# Patient Record
Sex: Male | Born: 1945 | Race: Black or African American | Hispanic: No | Smoking: Former smoker
Health system: Southern US, Community
[De-identification: ages and names within clinical notes are randomized; demographics above are authoritative.]

## PROBLEM LIST (undated history)

## (undated) DIAGNOSIS — I509 Heart failure, unspecified: Secondary | ICD-10-CM

## (undated) DIAGNOSIS — I219 Acute myocardial infarction, unspecified: Secondary | ICD-10-CM

## (undated) DIAGNOSIS — M1990 Unspecified osteoarthritis, unspecified site: Secondary | ICD-10-CM

## (undated) DIAGNOSIS — C801 Malignant (primary) neoplasm, unspecified: Secondary | ICD-10-CM

## (undated) DIAGNOSIS — C61 Malignant neoplasm of prostate: Secondary | ICD-10-CM

## (undated) DIAGNOSIS — I10 Essential (primary) hypertension: Secondary | ICD-10-CM

## (undated) HISTORY — DX: Acute myocardial infarction, unspecified: I21.9

## (undated) HISTORY — DX: Malignant neoplasm of prostate: C61

## (undated) HISTORY — PX: JOINT REPLACEMENT: SHX530

## (undated) HISTORY — DX: Malignant (primary) neoplasm, unspecified: C80.1

## (undated) HISTORY — PX: BACK SURGERY: SHX140

---

## 2002-09-21 ENCOUNTER — Inpatient Hospital Stay (HOSPITAL_COMMUNITY): Admission: RE | Admit: 2002-09-21 | Discharge: 2002-09-22 | Payer: Self-pay | Admitting: Neurosurgery

## 2002-09-21 ENCOUNTER — Encounter: Payer: Self-pay | Admitting: Neurosurgery

## 2002-11-07 ENCOUNTER — Encounter: Admission: RE | Admit: 2002-11-07 | Discharge: 2002-11-07 | Payer: Self-pay | Admitting: Neurosurgery

## 2002-11-07 ENCOUNTER — Encounter: Payer: Self-pay | Admitting: Neurosurgery

## 2006-07-07 ENCOUNTER — Ambulatory Visit: Payer: Self-pay | Admitting: Family Medicine

## 2008-06-08 ENCOUNTER — Ambulatory Visit: Payer: Self-pay | Admitting: Family Medicine

## 2010-02-24 ENCOUNTER — Ambulatory Visit: Payer: Self-pay | Admitting: Family Medicine

## 2010-12-17 ENCOUNTER — Ambulatory Visit: Payer: Self-pay | Admitting: Family Medicine

## 2012-01-25 ENCOUNTER — Ambulatory Visit: Payer: Self-pay | Admitting: Gastroenterology

## 2012-02-10 ENCOUNTER — Ambulatory Visit: Payer: Self-pay | Admitting: Gastroenterology

## 2013-09-14 ENCOUNTER — Ambulatory Visit: Payer: Self-pay | Admitting: Gastroenterology

## 2015-04-10 ENCOUNTER — Encounter
Admission: RE | Admit: 2015-04-10 | Discharge: 2015-04-10 | Disposition: A | Discharge status: Home / Self Care | Payer: 59 | Source: Ambulatory Visit | Attending: Specialist | Admitting: Specialist

## 2015-04-10 DIAGNOSIS — M25562 Pain in left knee: Secondary | ICD-10-CM | POA: Insufficient documentation

## 2015-04-10 DIAGNOSIS — Z01810 Encounter for preprocedural cardiovascular examination: Secondary | ICD-10-CM | POA: Insufficient documentation

## 2015-04-10 DIAGNOSIS — M1712 Unilateral primary osteoarthritis, left knee: Secondary | ICD-10-CM | POA: Insufficient documentation

## 2015-04-10 DIAGNOSIS — Z79899 Other long term (current) drug therapy: Secondary | ICD-10-CM | POA: Diagnosis not present

## 2015-04-10 DIAGNOSIS — Z01812 Encounter for preprocedural laboratory examination: Secondary | ICD-10-CM | POA: Diagnosis present

## 2015-04-10 DIAGNOSIS — I10 Essential (primary) hypertension: Secondary | ICD-10-CM | POA: Diagnosis not present

## 2015-04-10 HISTORY — DX: Unspecified osteoarthritis, unspecified site: M19.90

## 2015-04-10 HISTORY — DX: Essential (primary) hypertension: I10

## 2015-04-10 LAB — URINALYSIS COMPLETE WITH MICROSCOPIC (ARMC ONLY)
Bacteria, UA: NONE SEEN
Bilirubin Urine: NEGATIVE
Glucose, UA: NEGATIVE mg/dL
HGB URINE DIPSTICK: NEGATIVE
Ketones, ur: NEGATIVE mg/dL
Leukocytes, UA: NEGATIVE
Nitrite: NEGATIVE
PH: 6 (ref 5.0–8.0)
Protein, ur: NEGATIVE mg/dL
RBC / HPF: NONE SEEN RBC/hpf (ref 0–5)
SQUAMOUS EPITHELIAL / LPF: NONE SEEN
Specific Gravity, Urine: 1.016 (ref 1.005–1.030)

## 2015-04-10 LAB — SURGICAL PCR SCREEN
MRSA, PCR: NEGATIVE
Staphylococcus aureus: NEGATIVE

## 2015-04-10 LAB — TYPE AND SCREEN
ABO/RH(D): B NEG
Antibody Screen: NEGATIVE

## 2015-04-10 LAB — BASIC METABOLIC PANEL
Anion gap: 9 (ref 5–15)
BUN: 16 mg/dL (ref 6–20)
CALCIUM: 9.8 mg/dL (ref 8.9–10.3)
CO2: 26 mmol/L (ref 22–32)
Chloride: 105 mmol/L (ref 101–111)
Creatinine, Ser: 1.03 mg/dL (ref 0.61–1.24)
GFR calc Af Amer: >60 mL/min (ref >60)
GFR calc non Af Amer: >60 mL/min (ref >60)
Glucose, Bld: 90 mg/dL (ref 65–99)
Potassium: 4 mmol/L (ref 3.5–5.1)
SODIUM: 140 mmol/L (ref 135–145)

## 2015-04-10 LAB — CBC
HEMATOCRIT: 42.2 % (ref 40.0–52.0)
Hemoglobin: 14.3 g/dL (ref 13.0–18.0)
MCH: 32.5 pg (ref 26.0–34.0)
MCHC: 33.8 g/dL (ref 32.0–36.0)
MCV: 95.9 fL (ref 80.0–100.0)
Platelets: 134 10*3/uL — ABNORMAL LOW (ref 150–440)
RBC: 4.4 MIL/uL (ref 4.40–5.90)
RDW: 12.3 % (ref 11.5–14.5)
WBC: 4.4 10*3/uL (ref 3.8–10.6)

## 2015-04-10 LAB — PROTIME-INR
INR: 0.96
Prothrombin Time: 13 seconds (ref 11.4–15.0)

## 2015-04-10 LAB — APTT: aPTT: 31 seconds (ref 24–36)

## 2015-04-10 NOTE — OR Nursing (Signed)
EKG reviewed by Dr. Myra Gianotti and Laurie Panda for surgery

## 2015-04-10 NOTE — Patient Instructions (Signed)
  Your procedure is scheduled on: April 23, 2015 Report to Same  Day Surgery. To find out your arrival time please call [phone] between 1PM - 3PM on April 22, 2015.  Remember: Instructions that are not followed completely may result in serious medical risk, up to and including death, or upon the discretion of your surgeon and anesthesiologist your surgery may need to be rescheduled.    __x__ 1. Do not eat food or drink liquids after midnight. No gum chewing or hard candies.     ____ 2. No Alcohol for 24 hours before or after surgery.   ____ 3. Bring all medications with you on the day of surgery if instructed.    __x__ 4. Notify your doctor if there is any change in your medical condition     (cold, fever, infections).     Do not wear jewelry, make-up, hairpins, clips or nail polish.  Do not wear lotions, powders, or perfumes. You may wear deodorant.  Do not shave 48 hours prior to surgery. Men may shave face and neck.  Do not bring valuables to the hospital.    [facility] is not responsible for any belongings or valuables.               Contacts, dentures or bridgework may not be worn into surgery.  Leave your suitcase in the car. After surgery it may be brought to your room.  For patients admitted to the hospital, discharge time is determined by your                treatment team.   Patients discharged the day of surgery will not be allowed to drive home.   Please read over the following fact sheets that you were given:   Surgical Site Infection Prevention   ____ Take these medicines the morning of surgery with A SIP OF WATER:    1.   2.   3.   4.  5.  6.  ____ Fleet Enema (as directed)   __x__ Use CHG Soap as directed  ____ Use inhalers on the day of surgery  ____ Stop metformin 2 days prior to surgery    ____ Take 1/2 of usual insulin dose the night before surgery and none on the morning of surgery.   __x__ Stop Coumadin/Plavix/aspirin on one week before  surgery __x__ Stop Anti-inflammatories on one week before surgery (Meloxicam)   __x__ Stop supplements until after surgery.  now  ____ Bring C-Pap to the hospital.

## 2015-04-10 NOTE — OR Nursing (Signed)
Chart to anesthesia to review for abnormal EKG

## 2015-04-11 LAB — ABO/RH: ABO/RH(D): B NEG

## 2015-04-23 ENCOUNTER — Inpatient Hospital Stay: Payer: 59 | Admitting: Anesthesiology

## 2015-04-23 ENCOUNTER — Encounter: Payer: Self-pay | Admitting: *Deleted

## 2015-04-23 ENCOUNTER — Inpatient Hospital Stay
Admission: RE | Admit: 2015-04-23 | Discharge: 2015-04-26 | DRG: 470 | Disposition: A | Discharge status: Home / Self Care | Payer: 59 | Source: Ambulatory Visit | Attending: Specialist | Admitting: Specialist

## 2015-04-23 ENCOUNTER — Encounter
Admission: RE | Disposition: A | Discharge status: Home / Self Care | Payer: Medicare Other | Source: Ambulatory Visit | Attending: Specialist

## 2015-04-23 ENCOUNTER — Inpatient Hospital Stay: Payer: 59

## 2015-04-23 DIAGNOSIS — M1712 Unilateral primary osteoarthritis, left knee: Principal | ICD-10-CM | POA: Diagnosis present

## 2015-04-23 DIAGNOSIS — Z87891 Personal history of nicotine dependence: Secondary | ICD-10-CM | POA: Diagnosis not present

## 2015-04-23 DIAGNOSIS — I10 Essential (primary) hypertension: Secondary | ICD-10-CM | POA: Diagnosis present

## 2015-04-23 DIAGNOSIS — M1710 Unilateral primary osteoarthritis, unspecified knee: Secondary | ICD-10-CM | POA: Diagnosis present

## 2015-04-23 DIAGNOSIS — Z96652 Presence of left artificial knee joint: Secondary | ICD-10-CM

## 2015-04-23 DIAGNOSIS — Z96659 Presence of unspecified artificial knee joint: Secondary | ICD-10-CM

## 2015-04-23 HISTORY — PX: TOTAL KNEE ARTHROPLASTY: SHX125

## 2015-04-23 SURGERY — ARTHROPLASTY, KNEE, TOTAL
Anesthesia: Spinal | Laterality: Left | Wound class: Clean

## 2015-04-23 MED ORDER — CEFAZOLIN SODIUM-DEXTROSE 2-3 GM-% IV SOLR
INTRAVENOUS | Status: AC
  Filled 2015-04-23: qty 50

## 2015-04-23 MED ORDER — ONDANSETRON HCL 4 MG PO TABS: 4.0000 mg | ORAL_TABLET | Freq: Four times a day (QID) | ORAL | Status: DC | PRN

## 2015-04-23 MED ORDER — ADULT MULTIVITAMIN W/MINERALS CH
1.0000 | ORAL_TABLET | Freq: Every day | ORAL | Status: DC
  Administered 2015-04-23 – 2015-04-26 (×4): 1 via ORAL
  Filled 2015-04-23 (×4): qty 1

## 2015-04-23 MED ORDER — PROPOFOL INFUSION 10 MG/ML OPTIME
INTRAVENOUS | Status: DC | PRN
  Administered 2015-04-23: 50 ug/kg/min via INTRAVENOUS

## 2015-04-23 MED ORDER — BUPIVACAINE HCL (PF) 0.5 % IJ SOLN
INTRAMUSCULAR | Status: AC
  Filled 2015-04-23: qty 30

## 2015-04-23 MED ORDER — SODIUM CHLORIDE BACTERIOSTATIC 0.9 % IJ SOLN
INTRAMUSCULAR | Status: DC | PRN
  Administered 2015-04-23: 40 mL

## 2015-04-23 MED ORDER — FENTANYL CITRATE (PF) 100 MCG/2ML IJ SOLN: 25.0000 ug | INTRAMUSCULAR | Status: DC | PRN

## 2015-04-23 MED ORDER — SODIUM CHLORIDE 0.9 % IV SOLN
INTRAVENOUS | Status: DC
  Administered 2015-04-23 – 2015-04-24 (×2): via INTRAVENOUS

## 2015-04-23 MED ORDER — KETAMINE HCL 50 MG/ML IJ SOLN
INTRAMUSCULAR | Status: DC | PRN
  Administered 2015-04-23: 50 mg via INTRAMUSCULAR
  Administered 2015-04-23: 25 mg via INTRAMUSCULAR

## 2015-04-23 MED ORDER — SODIUM CHLORIDE 0.9 % IV SOLN
Freq: Once | INTRAVENOUS | Status: AC
  Administered 2015-04-23: 13:00:00 via INTRAVENOUS

## 2015-04-23 MED ORDER — FAMOTIDINE 20 MG PO TABS
ORAL_TABLET | ORAL | Status: AC
  Administered 2015-04-23: 20 mg via ORAL
  Filled 2015-04-23: qty 1

## 2015-04-23 MED ORDER — BUPIVACAINE LIPOSOME 1.3 % IJ SUSP
INTRAMUSCULAR | Status: DC | PRN
  Administered 2015-04-23: 20 mL

## 2015-04-23 MED ORDER — MENTHOL 3 MG MT LOZG
1.0000 | LOZENGE | OROMUCOSAL | Status: DC | PRN
  Filled 2015-04-23: qty 9

## 2015-04-23 MED ORDER — PHENOL 1.4 % MT LIQD
1.0000 | OROMUCOSAL | Status: DC | PRN
  Filled 2015-04-23: qty 177

## 2015-04-23 MED ORDER — CEFAZOLIN SODIUM-DEXTROSE 2-3 GM-% IV SOLR
2.0000 g | Freq: Once | INTRAVENOUS | Status: AC
  Administered 2015-04-23: 2 g via INTRAVENOUS

## 2015-04-23 MED ORDER — NEOMYCIN-POLYMYXIN B GU 40-200000 IR SOLN
Status: DC | PRN
  Administered 2015-04-23: 3000 mL

## 2015-04-23 MED ORDER — FAMOTIDINE 20 MG PO TABS
20.0000 mg | ORAL_TABLET | Freq: Once | ORAL | Status: AC
  Administered 2015-04-23: 20 mg via ORAL

## 2015-04-23 MED ORDER — BUPIVACAINE IN DEXTROSE 0.75-8.25 % IT SOLN
INTRATHECAL | Status: DC | PRN
  Administered 2015-04-23: 1.6 mL via INTRATHECAL

## 2015-04-23 MED ORDER — ONDANSETRON HCL 4 MG/2ML IJ SOLN: 4.0000 mg | Freq: Four times a day (QID) | INTRAMUSCULAR | Status: DC | PRN

## 2015-04-23 MED ORDER — ONDANSETRON HCL 4 MG/2ML IJ SOLN: 4.0000 mg | Freq: Once | INTRAMUSCULAR | Status: DC | PRN

## 2015-04-23 MED ORDER — PRAVASTATIN SODIUM 20 MG PO TABS
20.0000 mg | ORAL_TABLET | Freq: Every day | ORAL | Status: DC
  Administered 2015-04-23 – 2015-04-25 (×3): 20 mg via ORAL
  Filled 2015-04-23 (×3): qty 1

## 2015-04-23 MED ORDER — ACETAMINOPHEN 325 MG PO TABS: 650.0000 mg | ORAL_TABLET | Freq: Four times a day (QID) | ORAL | Status: DC | PRN

## 2015-04-23 MED ORDER — CELECOXIB 200 MG PO CAPS
200.0000 mg | ORAL_CAPSULE | Freq: Two times a day (BID) | ORAL | Status: DC
  Administered 2015-04-23 – 2015-04-26 (×6): 200 mg via ORAL
  Filled 2015-04-23 (×6): qty 1

## 2015-04-23 MED ORDER — MIDAZOLAM HCL 5 MG/5ML IJ SOLN
INTRAMUSCULAR | Status: DC | PRN
  Administered 2015-04-23: 2 mg via INTRAVENOUS

## 2015-04-23 MED ORDER — ENOXAPARIN SODIUM 30 MG/0.3ML ~~LOC~~ SOLN
30.0000 mg | Freq: Two times a day (BID) | SUBCUTANEOUS | Status: DC
  Administered 2015-04-24 – 2015-04-26 (×5): 30 mg via SUBCUTANEOUS
  Filled 2015-04-23 (×5): qty 0.3

## 2015-04-23 MED ORDER — GLYCOPYRROLATE 0.2 MG/ML IJ SOLN
INTRAMUSCULAR | Status: DC | PRN
  Administered 2015-04-23: 0.2 mg via INTRAVENOUS

## 2015-04-23 MED ORDER — VITAMIN B-12 1000 MCG PO TABS
1000.0000 ug | ORAL_TABLET | Freq: Every day | ORAL | Status: DC
  Administered 2015-04-23 – 2015-04-26 (×4): 1000 ug via ORAL
  Filled 2015-04-23 (×4): qty 1

## 2015-04-23 MED ORDER — BUPIVACAINE HCL 0.5 % IJ SOLN
INTRAMUSCULAR | Status: DC | PRN
  Administered 2015-04-23: 22 mL

## 2015-04-23 MED ORDER — OXYCODONE HCL 5 MG PO TABS
5.0000 mg | ORAL_TABLET | ORAL | Status: DC | PRN
  Administered 2015-04-23: 5 mg via ORAL
  Administered 2015-04-23 – 2015-04-25 (×8): 10 mg via ORAL
  Administered 2015-04-25 – 2015-04-26 (×4): 5 mg via ORAL
  Filled 2015-04-23 (×2): qty 1
  Filled 2015-04-23 (×4): qty 2
  Filled 2015-04-23 (×3): qty 1
  Filled 2015-04-23 (×4): qty 2

## 2015-04-23 MED ORDER — SODIUM CHLORIDE 0.9 % IV SOLN
Freq: Once | INTRAVENOUS | Status: AC
  Administered 2015-04-23: 18:00:00 via INTRAVENOUS

## 2015-04-23 MED ORDER — CEFAZOLIN SODIUM-DEXTROSE 2-3 GM-% IV SOLR
2.0000 g | Freq: Four times a day (QID) | INTRAVENOUS | Status: AC
  Administered 2015-04-23 (×2): 2 g via INTRAVENOUS
  Filled 2015-04-23 (×2): qty 50

## 2015-04-23 MED ORDER — ACETAMINOPHEN 650 MG RE SUPP: 650.0000 mg | Freq: Four times a day (QID) | RECTAL | Status: DC | PRN

## 2015-04-23 MED ORDER — PROPOFOL 10 MG/ML IV BOLUS
INTRAVENOUS | Status: DC | PRN
  Administered 2015-04-23: 10 mg via INTRAVENOUS
  Administered 2015-04-23: 20 mg via INTRAVENOUS

## 2015-04-23 MED ORDER — HYDROMORPHONE HCL 1 MG/ML IJ SOLN
1.0000 mg | INTRAMUSCULAR | Status: DC | PRN
  Administered 2015-04-23 (×2): 1 mg via INTRAVENOUS
  Filled 2015-04-23 (×2): qty 1

## 2015-04-23 MED ORDER — LACTATED RINGERS IV SOLN
INTRAVENOUS | Status: DC
  Administered 2015-04-23 (×2): via INTRAVENOUS

## 2015-04-23 MED ORDER — TRIAMTERENE-HCTZ 37.5-25 MG PO TABS
1.0000 | ORAL_TABLET | Freq: Every day | ORAL | Status: DC
  Administered 2015-04-23 – 2015-04-25 (×3): 1 via ORAL
  Filled 2015-04-23 (×3): qty 1

## 2015-04-23 MED ORDER — NEOMYCIN-POLYMYXIN B GU 40-200000 IR SOLN
Status: AC
  Filled 2015-04-23: qty 20

## 2015-04-23 MED ORDER — BUPIVACAINE LIPOSOME 1.3 % IJ SUSP
INTRAMUSCULAR | Status: AC
  Filled 2015-04-23: qty 20

## 2015-04-23 MED ORDER — SODIUM CHLORIDE 0.9 % IJ SOLN
INTRAMUSCULAR | Status: AC
  Filled 2015-04-23: qty 50

## 2015-04-23 MED FILL — Sodium Chloride Irrigation Soln 0.9%: Qty: 3000 | Status: AC

## 2015-04-23 MED FILL — Neomycin-Polymyxin B GU Irrigation Soln: Qty: 12 | Status: AC

## 2015-04-23 SURGICAL SUPPLY — 53 items
AUTOTRANSFUS HAS 1/8 (MISCELLANEOUS) ×3
BANDAGE ELASTIC 4 CLIP ST LF (GAUZE/BANDAGES/DRESSINGS) ×3 IMPLANT
BLADE DEBAKEY 8.0 (BLADE) ×2 IMPLANT
BLADE DEBAKEY 8.0MM (BLADE) ×1
BLADE SAGITTAL WIDE XTHICK NO (BLADE) ×3 IMPLANT
BNDG COHESIVE 6X5 TAN STRL LF (GAUZE/BANDAGES/DRESSINGS) ×3 IMPLANT
CANISTER SUCT 1200ML W/VALVE (MISCELLANEOUS) ×3 IMPLANT
CANISTER SUCT 3000ML (MISCELLANEOUS) ×3 IMPLANT
CAP KNEE TOTAL 3 SIGMA ×2 IMPLANT
CATH TRAY 16F METER LATEX (MISCELLANEOUS) ×3 IMPLANT
CEMENT HV SMART SET (Cement) ×4 IMPLANT
CHLORAPREP W/TINT 26ML (MISCELLANEOUS) ×3 IMPLANT
COOLER POLAR GLACIER W/PUMP (MISCELLANEOUS) ×3 IMPLANT
DRAPE INCISE IOBAN 66X45 STRL (DRAPES) ×3 IMPLANT
DRAPE SHEET LG 3/4 BI-LAMINATE (DRAPES) ×6 IMPLANT
DURAPREP 26ML APPLICATOR (WOUND CARE) ×3 IMPLANT
GAUZE PETRO XEROFOAM 1X8 (MISCELLANEOUS) ×6 IMPLANT
GAUZE SPONGE 4X4 12PLY STRL (GAUZE/BANDAGES/DRESSINGS) ×3 IMPLANT
GAUZE XEROFORM 4X4 STRL (GAUZE/BANDAGES/DRESSINGS) ×2 IMPLANT
GLOVE INDICATOR 8.0 STRL GRN (GLOVE) ×7 IMPLANT
GLOVE SURG ORTHO 8.0 STRL STRW (GLOVE) ×8 IMPLANT
GOWN STRL REUS W/ TWL LRG LVL3 (GOWN DISPOSABLE) ×3 IMPLANT
GOWN STRL REUS W/TWL LRG LVL3 (GOWN DISPOSABLE) ×9
HANDPIECE SUCTION TUBG SURGILV (MISCELLANEOUS) ×3 IMPLANT
HOLDER FOLEY CATH W/STRAP (MISCELLANEOUS) ×3 IMPLANT
IMMBOLIZER KNEE 19 BLUE UNIV (SOFTGOODS) ×3 IMPLANT
NDL SAFETY 18GX1.5 (NEEDLE) ×3 IMPLANT
NDL SPNL 18GX3.5 QUINCKE PK (NEEDLE) IMPLANT
NEEDLE SPNL 18GX3.5 QUINCKE PK (NEEDLE) ×3 IMPLANT
NS IRRIG 1000ML POUR BTL (IV SOLUTION) ×3 IMPLANT
PACK TOTAL KNEE (MISCELLANEOUS) ×3 IMPLANT
PAD GROUND ADULT SPLIT (MISCELLANEOUS) ×3 IMPLANT
PAD PREP 24X41 OB/GYN DISP (PERSONAL CARE ITEMS) ×1 IMPLANT
PAD WRAPON POLAR KNEE (MISCELLANEOUS) ×1 IMPLANT
PIN DRILL QUICK PACK ×3 IMPLANT
PIN FIXATION 1/8DIA X 3INL (PIN) ×3 IMPLANT
SOL .9 NS 3000ML IRR  AL (IV SOLUTION) ×2
SOL .9 NS 3000ML IRR AL (IV SOLUTION) ×1
SOL .9 NS 3000ML IRR UROMATIC (IV SOLUTION) ×1 IMPLANT
STAPLER SKIN PROX 35W (STAPLE) ×3 IMPLANT
STRAP SAFETY BODY (MISCELLANEOUS) ×1 IMPLANT
SUCTION FRAZIER TIP 10 FR DISP (SUCTIONS) ×3 IMPLANT
SUT ETHIBOND CT1 BRD #0 30IN (SUTURE) ×5 IMPLANT
SUT ETHIBOND NAB CT1 #1 30IN (SUTURE) ×4 IMPLANT
SUT VIC AB 2-0 CT1 27 (SUTURE) ×6
SUT VIC AB 2-0 CT1 TAPERPNT 27 (SUTURE) ×2 IMPLANT
SYR 20CC LL (SYRINGE) ×3 IMPLANT
SYR TOOMEY 50ML (SYRINGE) ×2 IMPLANT
SYSTEM AUTOTRANSFUS DUAL TROCR (MISCELLANEOUS) ×1 IMPLANT
TOWER CARTRIDGE SMART MIX (DISPOSABLE) ×3 IMPLANT
TUBE SUCT KAM VAC (TUBING) ×3 IMPLANT
WATER STERILE IRR 1000ML POUR (IV SOLUTION) ×3 IMPLANT
WRAPON POLAR PAD KNEE (MISCELLANEOUS) ×3

## 2015-04-23 NOTE — Progress Notes (Signed)
Applied avi boots and polar care

## 2015-04-23 NOTE — Brief Op Note (Signed)
04/23/2015  10:11 AM  PATIENT:  Rodell Perna  69 y.o. male none PRE-OPERATIVE DIAGNOSIS:  Severe left knee pain secondary to osteoarthritis   POST-OPERATIVE DIAGNOSIS:  Osteoarthritis   PROCEDURE:  Procedure(s): TOTAL KNEE ARTHROPLASTY (Left)  SURGEON:  Surgeon(s) and Role:    * Christophe Louis, MD - Primary    * Earnestine Leys, MD - Assisting  PHYSICIAN ASSISTANT:   ASSISTANTS:    ANESTHESIA:   spinal  EBL:  Total I/O In: 1500 [I.V.:1500] Out: 125 [Urine:100; Blood:25]  BLOOD ADMINISTERED:none  DRAINS: (Autovac drain, usual autovac rules) Hemovact drain(s) in the knee with  Suction Open   LOCAL MEDICATIONS USED:  MARCAINE     SPECIMEN:  No Specimen  DISPOSITION OF SPECIMEN:  N/A  COUNTS:  YES  TOURNIQUET:   Total Tourniquet Time Documented: Thigh (Left) - 97 minutes Total: Thigh (Left) - 97 minutes   DICTATION: .Other Dictation: Dictation Number [id number]  PLAN OF CARE: Admit to inpatient   PATIENT DISPOSITION:  PACU - hemodynamically stable.   Delay start of Pharmacological VTE agent (>24hrs) due to surgical blood loss or risk of bleeding: no

## 2015-04-23 NOTE — Plan of Care (Signed)
Problem: Consults Goal: Diagnosis- Total Joint Replacement Primary Total Knee     

## 2015-04-23 NOTE — H&P (Signed)
  69 year old male with severe osteoarthritis left knee. Complete H and P has been added to the chart today as a paper document.  Heart and lungs clear. ENT normal. Plan: left LCS total knee replacement.

## 2015-04-23 NOTE — Transfer of Care (Signed)
Immediate Anesthesia Transfer of Care Note  Patient: Seth Byrd  Procedure(s) Performed: Procedure(s): TOTAL KNEE ARTHROPLASTY (Left)  Patient Location: PACU  Anesthesia Type:Spinal  Level of Consciousness: sedated  Airway & Oxygen Therapy: Patient connected to face mask oxygen  Post-op Assessment: Report given to RN and Post -op Vital signs reviewed and stable  Post vital signs: Reviewed and stable  Last Vitals:  Filed Vitals:   04/23/15 0620  BP: 140/85  Pulse: 72  Temp: 36.6 C  Resp: 16    Complications: No apparent anesthesia complications

## 2015-04-23 NOTE — Anesthesia Postprocedure Evaluation (Signed)
  Anesthesia Post-op Note  Patient: Seth Byrd  Procedure(s) Performed: Procedure(s): TOTAL KNEE ARTHROPLASTY (Left)  Anesthesia type:Spinal  Patient location: PACU  Post pain: Pain level controlled  Post assessment: Post-op Vital signs reviewed, Patient's Cardiovascular Status Stable, Respiratory Function Stable, Patent Airway and No signs of Nausea or vomiting  Post vital signs: Reviewed and stable  Last Vitals:  Filed Vitals:   04/23/15 1204  BP: 129/61  Pulse: 56  Temp: 36.4 C  Resp: 18    Level of consciousness: awake, alert  and patient cooperative  Complications: No apparent anesthesia complications

## 2015-04-23 NOTE — Progress Notes (Signed)
Dr Tamala Julian notified of no home meds  Resumed. md reports re-order mevacor 20mg  po qd,maxzide 37.5/25 po qhs,vitamin b12 1000mg  po qd  And multivitamin po qd. Pt with full sensation ble. Pain relief with present regime. Tolerated 1 autovac without incident. Good appetite

## 2015-04-23 NOTE — Anesthesia Preprocedure Evaluation (Addendum)
Anesthesia Evaluation  Patient identified by MRN, date of birth, ID band Patient awake    Reviewed: Allergy & Precautions, NPO status , Patient's Chart, lab work & pertinent test results, reviewed documented beta blocker date and time   Airway Mallampati: II  TM Distance: >3 FB     Dental  (+) Upper Dentures, Lower Dentures   Pulmonary former smoker,          Cardiovascular hypertension,     Neuro/Psych    GI/Hepatic   Endo/Other    Renal/GU      Musculoskeletal  (+) Arthritis -,   Abdominal   Peds  Hematology   Anesthesia Other Findings Pt has had lumbar lam with fusion. He understands that GOT is a possibility.  Reproductive/Obstetrics                           Anesthesia Physical Anesthesia Plan  ASA: III  Anesthesia Plan: Spinal   Post-op Pain Management:    Induction:   Airway Management Planned: Nasal Cannula  Additional Equipment:   Intra-op Plan:   Post-operative Plan:   Informed Consent: I have reviewed the patients History and Physical, chart, labs and discussed the procedure including the risks, benefits and alternatives for the proposed anesthesia with the patient or authorized representative who has indicated his/her understanding and acceptance.     Plan Discussed with: CRNA  Anesthesia Plan Comments:         Anesthesia Quick Evaluation

## 2015-04-23 NOTE — Care Management Note (Addendum)
Case Management Note  Patient Details  Name: Seth Byrd MRN: [id number] Date of Birth: 05-04-1946  Subjective/Objective:                  Met with patient and one of his daughters (both attended joint class) to discuss discharge planning. He states [employer] is his primary payer since he still works; Medicare is secondary. He IS NOT A MEDICARE BUNDLE patient according to this information. Met with daughter Kenney Houseman that patient would be going home with and her address is: [address] [place]. He has never used home health and has never been to SNF. He has his personal rolling walker present in this room for PT to evaluation. He uses Paediatric nurse on [address] for Rx.  Action/Plan: List of home health agencies left for this patient. RNCM will continue to follow.   Expected Discharge Date:                  Expected Discharge Plan:     In-House Referral:     Discharge planning Services  CM Consult  Post Acute Care Choice:    Choice offered to:  Patient, Adult Children  DME Arranged:    DME Agency:     HH Arranged:    [place] Agency:     Status of Service:     Medicare Important Message Given:    Date Medicare IM Given:    Medicare IM give by:    Date Additional Medicare IM Given:    Additional Medicare Important Message give by:     If discussed at [place] of Stay Meetings, dates discussed:    Additional Comments:  Daughters still deciding on home health agency.. To call RNCM at [phone] with name of agency.   Marshell Garfinkel, RN 04/23/2015, 2:31 PM

## 2015-04-23 NOTE — Anesthesia Procedure Notes (Addendum)
Spinal Patient location during procedure: OR Start time: 04/23/2015 7:30 AM End time: 04/23/2015 7:35 AM Staffing Anesthesiologist: Gunnar Bulla Performed by: anesthesiologist  Preanesthetic Checklist Completed: patient identified, site marked, surgical consent, pre-op evaluation, timeout performed, IV checked and risks and benefits discussed Spinal Block Patient position: sitting Prep: Betadine Patient monitoring: heart rate, cardiac monitor, continuous pulse ox and blood pressure Approach: midline Location: L3-4 Injection technique: single-shot Needle Needle type: Pencil-Tip  Needle gauge: 25 G Needle length: 9 cm Assessment Sensory level: T10 Additional Notes 12.5mg  of marcaine in intrathecal space.  Date/Time: 04/23/2015 7:53 AM Performed by: Nelda Marseille Pre-anesthesia Checklist: Emergency Drugs available, Patient identified, Suction available, Patient being monitored and Timeout performed Oxygen Delivery Method: Simple face mask

## 2015-04-24 LAB — CBC
HEMATOCRIT: 35.7 % — AB (ref 40.0–52.0)
Hemoglobin: 12.1 g/dL — ABNORMAL LOW (ref 13.0–18.0)
MCH: 32.3 pg (ref 26.0–34.0)
MCHC: 33.9 g/dL (ref 32.0–36.0)
MCV: 95.2 fL (ref 80.0–100.0)
Platelets: 114 10*3/uL — ABNORMAL LOW (ref 150–440)
RBC: 3.75 MIL/uL — ABNORMAL LOW (ref 4.40–5.90)
RDW: 12.2 % (ref 11.5–14.5)
WBC: 5.9 10*3/uL (ref 3.8–10.6)

## 2015-04-24 LAB — CREATININE, SERUM
Creatinine, Ser: 0.91 mg/dL (ref 0.61–1.24)
GFR calc Af Amer: >60 mL/min (ref >60)

## 2015-04-24 NOTE — Op Note (Signed)
NAMEJERMEY, CLOSS NO.:  [id number]  MEDICAL RECORD NO.:  [id number]  LOCATION:  156A                         FACILITY:  [facility]  PHYSICIAN:  Margaretmary Eddy, MD        DATE OF BIRTH:  1946/07/28  DATE OF PROCEDURE:  04/23/2015 DATE OF DISCHARGE:                              OPERATIVE REPORT   PREOPERATIVE DIAGNOSIS:  Primary osteoarthritis, left knee.  POSTOPERATIVE DIAGNOSIS:  Primary osteoarthritis, left knee.  PROCEDURE PERFORMED:  LCS left total knee arthroplasty.  SURGEON:  Margaretmary Eddy, MD  SURGEON:  Margaretmary Eddy, MD  ASSISTANT:  Park Breed, MD.  ANESTHESIA:  Spinal.  COMPLICATIONS:  None.  TOURNIQUET TIME:  96 minutes.  DESCRIPTION OF PROCEDURE:  Ancef 2 g was given intravenously prior to the procedure.  Spinal anesthesia was induced.  The left lower extremity was thoroughly prepped with alcohol and ChloraPrep and draped in standard sterile fashion.  The extremity was wrapped out with the Esmarch bandage and pneumatic tourniquet was elevated to 330 mmHg. Standard anterior longitudinal incision was made over the knee and the dissection carried down to the medial and lateral retinaculum.  Medial parapatellar incision was made and the knee was flexed and the patella was reflected laterally.  Standard synovial and hypertrophic osteophyte debridement was performed.  Standard medial release at the proximal tibia was performed because of the varus alignment.  The knee is flexed and the proximal tibial cutter put into place.  Proximal tibial cut was made and is seemed to be satisfactory.  The distal femur was measured as a large and the large cutting block was then put into place and the central drill hole made.  Femoral guide rotation component was put into place and the cutting block pinned.  Anterior and posterior cuts were then made.  A 5 degree valgus cutting block for the distal femur was impacted in place and pinned and the distal femoral cut  made.  Femoral shaping guide was put into place and the appropriate cuts made and the appropriate drill hole.  Retractors were placed and the proximal tibia was revealed.  This was sized as a +4 and the guide and cutting block pinned.  Central drill hole was made and the keeled hole cutter was impacted into place.  All trial components were then inserted and seemed to be a good fit.  Patella was performed in the usual manner.  All trial components were removed.  The wound was thoroughly irrigated multiple times throughout the case with pulsatile lavage.  The Exparel was used to infiltrate carefully the posterior capsule and the gutters.  The knee was then flex, lavaged once again.  The #4 metal tibial tray was cemented into place.  The 10 mm rotating platform was put into position. The large porous-coated femoral component was impacted into place with a small amount of cement in the intercondylar notch and the drill holes. Porous-coated large patellar component was impacted into place.  Range of motion was excellent.  Two Autovac drains were brought out through separate stab wound incisions.  Retinaculum was repaired with #2 Ti-Cron.  Subcutaneous tissue was closed with 2-0 Vicryl and the skin was closed with  the skin stapler.  Polar Care sterile dressing and knee immobilizer were applied.  The patient was returned to the recovery room in satisfactory condition, having tolerated the procedure quite well.          ______________________________ Margaretmary Eddy, MD     CS/MEDQ  D:  04/23/2015  T:  04/24/2015  Job:  [id number]

## 2015-04-24 NOTE — Progress Notes (Signed)
Physical Therapy Treatment Patient Details Name: Seth Byrd MRN: [id number] DOB: [birth year] Today's Date: 04/24/2015    History of Present Illness Pt underwent L TKR and is currently POD#1. No complications reported. At baseline pt is fully independent and works. No falls in the last 12 months.     PT Comments    Pt demonstrates improved mobility this afternoon. Gait quality improves as well as speed. Pt demonstrating increased confidence in progress. Pt will benefit from skilled PT services to address deficits in strength, balance, and mobility in order to return to full function at home.    Follow Up Recommendations  Home health PT     Equipment Recommendations  None recommended by PT    Recommendations for Other Services       Precautions / Restrictions Precautions Precautions: Fall;Knee Precaution Booklet Issued: Yes (comment) Required Braces or Orthoses: Knee Immobilizer - Left Knee Immobilizer - Left: On when out of bed or walking Restrictions Weight Bearing Restrictions: Yes LLE Weight Bearing: Weight bearing as tolerated    Mobility  Bed Mobility               General bed mobility comments: Pt received in recliner and returned to recliner  Transfers Overall transfer level: Needs assistance Equipment used: Rolling walker (2 wheeled) Transfers: Sit to/from Stand Sit to Stand: Min guard         General transfer comment: Improved hand placement and sequencing. Improved speed noted this afternoon. Improved ability to accept weight to LLE  Ambulation/Gait Ambulation/Gait assistance: Min guard Ambulation Distance (Feet): 150 Feet Assistive device: Rolling walker (2 wheeled) Gait Pattern/deviations: Step-to pattern;Decreased step length - right;Decreased stance time - left   Gait velocity interpretation: <1.8 ft/sec, indicative of risk for recurrent falls General Gait Details: Pt provided cues to increase R step length, improve posture, and decrease  reliance on bilateral UE for support. Pt demonstrates improved speed and sequencing this afternoon. Progresses to full reciprocal gait pattern. Vitals monitored and remain WNL throughout ambulation   Stairs            Wheelchair Mobility    Modified Rankin (Stroke Patients Only)       Balance Overall balance assessment: Needs assistance   Sitting balance-Leahy Scale: Normal       Standing balance-Leahy Scale: Fair                      Cognition Arousal/Alertness: Awake/alert Behavior During Therapy: WFL for tasks assessed/performed Overall Cognitive Status: Within Functional Limits for tasks assessed                      Exercises Total Joint Exercises Ankle Circles/Pumps: AROM;Both;10 reps;Seated Quad Sets: Strengthening;Both;10 reps;Seated Towel Squeeze: Strengthening;Both;10 reps;Seated Heel Slides: Strengthening;Both;10 reps;Seated (resisted extension) Hip ABduction/ADduction: Strengthening;Both;10 reps;Seated Long Arc Quad: Strengthening;10 reps;Both;Seated Marching in Standing: Strengthening;Both;10 reps;Seated    General Comments        Pertinent Vitals/Pain Pain Assessment: 0-10 Pain Score: 2  Pain Location: L knee Pain Intervention(s): Monitored during session    Home Living                      Prior Function            PT Goals (current goals can now be found in the care plan section) Acute Rehab PT Goals Patient Stated Goal: "I don't know if I want to go home or rehab" PT Goal Formulation: With patient/family Time For  Goal Achievement: 05/08/15 Potential to Achieve Goals: Good Progress towards PT goals: Progressing toward goals    Frequency  BID    PT Plan Current plan remains appropriate    Co-evaluation             End of Session Equipment Utilized During Treatment: Gait belt;Left knee immobilizer Activity Tolerance: Patient tolerated treatment well Patient left: in chair;with call bell/phone  within reach;with family/visitor present;with SCD's reapplied (Polar care and towel roll in place. KI removed)     Time: 1478-2956 PT Time Calculation (min) (ACUTE ONLY): 32 min  Charges:  $Gait Training: 8-22 mins $Therapeutic Exercise: 8-22 mins                    G Codes:      Seth Byrd PT, DPT   Seth Byrd 04/24/2015, 3:09 PM

## 2015-04-24 NOTE — Evaluation (Signed)
Occupational Therapy Evaluation Patient Details Name: Seth Byrd MRN: [id number] DOB: 08-24-1946 Today's Date: 04/24/2015    History of Present Illness This patient is a 69 year old male who came to [facility] for a L TKR.   Clinical Impression   This patient is a 69 year old male who came to [facility] for a L total knee replacement.  Patient lives in a one story home with 4-5 steps to enter. He lives alone but will be staying with a daughter for a while.   He had been independent with ADL and functional mobility with a single point cane.  He  now requires minimal assistance for lower body dressing. He practiced today Donned/doffed socks and pants to knees (drain still in place), with minimal assist and verbal cues using hip kit. He would benefit from Occupational Therapy for ADL/functioal mobility training.       Follow Up Recommendations       Equipment Recommendations    continue to evaluate    Recommendations for Other Services       Precautions / Restrictions Precautions Precautions: Fall Precaution Booklet Issued: Yes (comment) Required Braces or Orthoses: Knee Immobilizer - Left Knee Immobilizer - Left: On when out of bed or walking Restrictions Weight Bearing Restrictions: Yes LLE Weight Bearing: Weight bearing as tolerated      Mobility Bed Mobility Overal bed mobility:  Bed Mobility:     Supine to sit: Min assist      Transfers            Balance                              ADL                                         General ADL Comments: Previously independent including working full time now minimal assist for lower body dressing     Vision     Perception     Praxis      Pertinent Vitals/Pain Pain Assessment: 0-10 Pain Score: 2  (Increases to 4/10 with ambulation) Pain Location: L knee Pain Intervention(s): Premedicated before session     Hand Dominance Right   Extremity/Trunk  Assessment Upper Extremity Assessment Upper Extremity Assessment: Overall WFL for tasks assessed         Communication Communication Communication: No difficulties   Cognition Arousal/Alertness: Awake/alert Behavior During Therapy: WFL for tasks assessed/performed Overall Cognitive Status: Within Functional Limits for tasks assessed                     General Comments       Exercises     Shoulder Instructions      Home Living Family/patient expects to be discharged to:: Skilled nursing facility Living Arrangements: Alone Available Help at Discharge: Family Type of Home: House Home Access: Stairs to enter [employer] of Steps: 1   Home Layout: One level               Home Equipment: Environmental consultant - 2 wheels;Cane - single point;Bedside commode   Additional Comments: Will be going to daugther's house who has 4 stairs to enter but is a single level home.      Prior Functioning/Environment Level of Independence: Independent with assistive device(s)  OT Diagnosis: Generalized weakness   OT Problem List: Pain (decreased ADL independence)   OT Treatment/Interventions:      OT Goals(Current goals can be found in the care plan section) Acute Rehab OT Goals Patient Stated Goal: Patient would rather go home OT Goal Formulation: With patient/family Time For Goal Achievement: 05/08/15 Potential to Achieve Goals: Good  OT Frequency:     Barriers to D/C:            Co-evaluation              End of Session Equipment Utilized During Treatment:  (Hip kit)  Activity Tolerance: Patient tolerated treatment well Patient left: in chair;with chair alarm set;with call bell/phone within reach   Time: 0938-1001 OT Time Calculation (min): 23 min Charges:  OT General Charges $OT Visit: 1 Procedure OT Evaluation $Initial OT Evaluation Tier I: 1 Procedure OT Treatments $Self Care/Home Management : 8-22 mins G-Codes:    Huff, John M  John M Huff, MS/OTR/L  04/24/2015, 10:15 AM    

## 2015-04-24 NOTE — Evaluation (Signed)
Physical Therapy Evaluation Patient Details Name: Seth Byrd MRN: [id number] DOB: May 31, 1946 Today's Date: 04/24/2015   History of Present Illness  Pt underwent L TKR and is currently POD#1. No complications reported. At baseline pt is fully independent and works. No falls in the last 12 months.   Clinical Impression  Pt demonstrates good sequencing with bed mobility, transfers, and ambulation. He initially has decreased weight acceptance on LLE but that improves with ambulation distance. Pt lacking confidence with respect to discharge planning but therapist believes he will be safe discharging home with HHPT. Pt will benefit from skilled PT services to address deficits in strength, balance, and mobility in order to return to full function at home.     Follow Up Recommendations Home health PT    Equipment Recommendations  None recommended by PT    Recommendations for Other Services       Precautions / Restrictions Precautions Precautions: Fall;Knee Precaution Booklet Issued: Yes (comment) Required Braces or Orthoses: Knee Immobilizer - Left Knee Immobilizer - Left: On when out of bed or walking Restrictions Weight Bearing Restrictions: Yes LLE Weight Bearing: Weight bearing as tolerated      Mobility  Bed Mobility Overal bed mobility: Needs Assistance Bed Mobility: Supine to Sit     Supine to sit: Min assist     General bed mobility comments: Pt cued for sequencing. Able to fully abuct LLE to get off bed to L, Requires some assist with trunk but once upright steady and stable  Transfers Overall transfer level: Needs assistance Equipment used: Rolling walker (2 wheeled) Transfers: Sit to/from Stand Sit to Stand: Min guard         General transfer comment: Pt requires cues for hand placement and positioning with transfer. Demonstrates good strength with RLE as LLE is in KI and unable to assist until upright  Ambulation/Gait Ambulation/Gait assistance: Min  guard Ambulation Distance (Feet): 35 Feet Assistive device: Rolling walker (2 wheeled) Gait Pattern/deviations: Decreased step length - right;Decreased weight shift to left;Antalgic   Gait velocity interpretation: <1.8 ft/sec, indicative of risk for recurrent falls General Gait Details: Decreased step length on RLE with decreased weight acceptance on LLE. Improves throughout ambulation distance. Pt able to progress to partial reciprocal pattern. Speed gradually increases but is still significantly below norms for age  Stairs            Wheelchair Mobility    Modified Rankin (Stroke Patients Only)       Balance Overall balance assessment: Needs assistance Sitting-balance support: No upper extremity supported;Feet supported Sitting balance-Leahy Scale: Normal     Standing balance support: Bilateral upper extremity supported Standing balance-Leahy Scale: Fair                               Pertinent Vitals/Pain Pain Assessment: 0-10 Pain Score: 2  (Increases to 4/10 with ambulation) Pain Location: L knee Pain Intervention(s): Premedicated before session    Home Living Family/patient expects to be discharged to:: Skilled nursing facility Living Arrangements: Alone Available Help at Discharge: Family Type of Home: House Home Access: Stairs to enter   [employer] of Steps: 1 Home Layout: One level Home Equipment: Environmental consultant - 2 wheels;Cane - single point;Bedside commode (tub shower, no grab bars, no seat) Additional Comments: Will be going to daugther's house who has 4 stairs to enter but is a single level home.    Prior Function Level of Independence: Independent with assistive device(s) (  prn use of single point cane)               Hand Dominance   Dominant Hand: Right    Extremity/Trunk Assessment   Upper Extremity Assessment: Overall WFL for tasks assessed           Lower Extremity Assessment: Overall WFL for tasks assessed;LLE  deficits/detail   LLE Deficits / Details: Pt able to complete full SLR and full SAQ on L without assist. General post-op weakness of L knee noted. 4+/5 L ankle dorsiflexion     Communication   Communication: No difficulties  Cognition Arousal/Alertness: Awake/alert Behavior During Therapy: WFL for tasks assessed/performed Overall Cognitive Status: Within Functional Limits for tasks assessed                      General Comments      Exercises Total Joint Exercises Ankle Circles/Pumps: AROM;Both;10 reps;Supine Quad Sets: Strengthening;Both;10 reps;Supine Gluteal Sets: Strengthening;Both;10 reps;Supine Towel Squeeze: Strengthening;Both;10 reps;Supine Short Arc Quad: Strengthening;Both;10 reps;Supine Heel Slides: Strengthening;Both;10 reps;Supine (resisted extension) Hip ABduction/ADduction: Strengthening;Both;10 reps;Supine Straight Leg Raises: Strengthening;Both;10 reps;Supine Goniometric ROM: -3 to 65 degrees AROM with overpressure, pain limited      Assessment/Plan    PT Assessment Patient needs continued PT services  PT Diagnosis Difficulty walking;Generalized weakness;Acute pain   PT Problem List Decreased strength;Decreased range of motion;Decreased activity tolerance;Decreased balance;Decreased mobility;Pain  PT Treatment Interventions DME instruction;Gait training;Stair training;Therapeutic activities;Therapeutic exercise;Balance training;Functional mobility training;Neuromuscular re-education;Patient/family education;Manual techniques   PT Goals (Current goals can be found in the Care Plan section) Acute Rehab PT Goals Patient Stated Goal: "I don't know if I want to go home or rehab" PT Goal Formulation: With patient/family Time For Goal Achievement: 05/08/15 Potential to Achieve Goals: Good    Frequency BID   Barriers to discharge        Co-evaluation               End of Session Equipment Utilized During Treatment: Gait belt;Left knee  immobilizer Activity Tolerance: Patient tolerated treatment well Patient left: in chair;with call bell/phone within reach;with family/visitor present (OT in room with patient. Agrees to apply polar care/towel )           Time: 5329-9242 PT Time Calculation (min) (ACUTE ONLY): 35 min   Charges:   PT Evaluation $Initial PT Evaluation Tier I: 1 Procedure PT Treatments $Therapeutic Exercise: 8-22 mins   PT G Codes:       Lyndel Safe Elyas Villamor PT, DPT Fawne Hughley 04/24/2015, 9:46 AM

## 2015-04-24 NOTE — Clinical Social Work Note (Signed)
Clinical Social Work Assessment  Patient Details  Name: Seth Byrd MRN: [id number] Date of Birth: 1946/07/12  Date of referral:  04/24/15               Reason for consult:  Facility Placement                Permission sought to share information with:  Family Supports Permission granted to share information::  Yes, Verbal Permission Granted  Name::        Agency::     Relationship::     Contact Information:     Housing/Transportation Living arrangements for the past 2 months:   (home) Source of Information:  Patient Patient Interpreter Needed:  None Criminal Activity/Legal Involvement Pertinent to Current Situation/Hospitalization:  No - Comment as needed Significant Relationships:  Adult Children Lives with:  Adult Children Do you feel safe going back to the place where you live?  Yes Need for family participation in patient care:  Yes (Comment)  Care giving concerns:  none   Facilities manager / plan:  CSW spoke with physical therapy this morning and they are recommending that patient return home with home health and stated that patient has no barriers to return home. Physical therapy documented no equipment needs at this time either.   CSW spoke with patient and his two daughters: Kenney Houseman and Lattie Haw this morning regarding discharge planning and that the RN CM had already made plans with them for patient to return home with home health. CSW informed patient that in the event the recommendations change, the offer to conduct a short term rehab bedsearch would be available. Patient's daughter Kenney Houseman, will be with patient after discharge and will assist with patient's needs. CSW to continue to follow.  Employment status:  Retired Surveyor, [employer] PT Recommendations:  Home with [place] / Referral to community resources:     Patient/Family's Response to care:  Pleasant and cooperative  Patient/Family's Understanding of and Emotional Response  to Diagnosis, Current Treatment, and Prognosis:  Patient and family expressed appreciation for CSW visit.   Emotional Assessment Appearance:  Appears stated age Attitude/Demeanor/Rapport:   (pleasant and cooperative) Affect (typically observed):  Accepting, Adaptable, Pleasant Orientation:  Oriented to Self, Oriented to Place, Oriented to  Time, Oriented to Situation Alcohol / Substance use:  Not Applicable Psych involvement (Current and /or in the community):  No (Comment)  Discharge Needs  Concerns to be addressed:  Denies Needs/Concerns at this time Readmission within the last 30 days:  No Current discharge risk:  None Barriers to Discharge:  No Barriers Identified   Shela Leff, LCSW 04/24/2015, 10:08 AM

## 2015-04-25 MED ORDER — MAGNESIUM HYDROXIDE 400 MG/5ML PO SUSP
30.0000 mL | Freq: Two times a day (BID) | ORAL | Status: DC | PRN
  Administered 2015-04-25: 30 mL via ORAL
  Filled 2015-04-25: qty 30

## 2015-04-25 MED ORDER — BISACODYL 10 MG RE SUPP
10.0000 mg | Freq: Every day | RECTAL | Status: DC | PRN
  Administered 2015-04-25: 10 mg via RECTAL
  Filled 2015-04-25 (×2): qty 1

## 2015-04-25 NOTE — Progress Notes (Signed)
Occupational Therapy Treatment Patient Details Name: Seth Byrd MRN: [id number] DOB: [birth year] Today's Date: 04/25/2015    History of present illness This patient is a 69 year old male who came to [facility] for a L TKR.    OT comments  Today patient demonstrated ability to dress lower body with contact guard assist and verbal cues for safety. He only needed the reacher to pull sock around the back of his ankle.  Follow Up Recommendations  No OT follow up    Equipment Recommendations       Recommendations for Other Services  (Home with home health PT, no further OT needed.)    Precautions / Restrictions Precautions Precautions: Fall;Knee Restrictions Weight Bearing Restrictions: No LLE Weight Bearing: Weight bearing as tolerated       Mobility Bed Mobility                  Transfers Overall transfer level: Needs assistance Equipment used: Rolling walker (2 wheeled) Transfers: Sit to/from Stand Sit to Stand: Min guard         General transfer comment: Improved use of LLE    Balance           Standing balance support: Bilateral upper extremity supported Standing balance-Leahy Scale: Fair                     ADL                                         General ADL Comments: Patient demonstrated ability to dress lower body with reacher only (socks and pants) with contact guard and cues for safety      Vision                     Perception     Praxis      Cognition   Behavior During Therapy: [facility] for tasks assessed/performed Overall Cognitive Status: Within Functional Limits for tasks assessed                       Extremity/Trunk Assessment               Exercises Total Joint Exercises Ankle Circles/Pumps: AROM;Both;20 reps;Supine Quad Sets: Strengthening;Both;20 reps;Supine Gluteal Sets: Strengthening;Both;10 reps;Supine Short Arc Quad: AROM;20 reps;Left;Seated Heel Slides: AAROM;Left;10  reps;Seated Long Arc Quad: AAROM;Left;20 reps;Seated Marching in Standing: AROM;Left;20 reps;Seated Other Exercises Other Exercises: STS 4x   Shoulder Instructions       General Comments      Pertinent Vitals/ Pain       Pain Assessment: 0-10 Pain Score: 2  Pain Location: L knee Pain Descriptors / Indicators: Aching Pain Intervention(s): Monitored during session;Premedicated before session  Home Living                                          Prior Functioning/Environment              Frequency       Progress Toward Goals  OT Goals(current goals can now be found in the care plan section)  Progress towards OT goals: Progressing toward goals  Acute Rehab OT Goals Patient Stated Goal: Now to go home OT Goal Formulation: With patient/family Potential to Achieve Goals: Good  Plan  Discharge plan remains appropriate    Co-evaluation                 End of Session Equipment Utilized During Treatment:  (reacher)   Activity Tolerance Patient tolerated treatment well   Patient Left  (Nursing took patient to bathroom after the session)   Nurse Communication          Time: 8110-3159 OT Time Calculation (min): 13 min  Charges: OT General Charges $OT Visit: 1 Procedure OT Treatments $Self Care/Home Management : 8-22 mins  Myrene Galas, MS/OTR/L  04/25/2015, 2:51 PM

## 2015-04-25 NOTE — Progress Notes (Signed)
Physical Therapy Treatment Patient Details Name: Seth Byrd MRN: [id number] DOB: 10-30-1946 Today's Date: 04/25/2015    History of Present Illness Pt underwent L TKR and is currently POD#1. No complications reported. At baseline pt is fully independent and works. No falls in the last 12 months.     PT Comments    Pt eager/motivated to participate in PT. Demonstrating improved quality/fluidity of ambulation with cueing and continued practice. Initiated steps with good safety/technique this a.m. Pt educated initially on incentive spirometer use and frequency; O2 saturation improved to 95% post use. Pt/family education and pt performance of exercise namely focusing on stretching into L knee extension and flexion with education on carry over at home including technique, repetitions, frequency and duration. Education on QS performance and importance as well. Pt educated on equal use of BLEs with transfers with improved techniques toward sessions end. Family with good involvement/support. Continue this pm to continue progress in active assisted range of motion, strength, quality of transfers and ambulation/stairs and reinforce exercise/stretching program to allow for optimal/safe return home.   Follow Up Recommendations  Home health PT     Equipment Recommendations  None recommended by PT    Recommendations for Other Services       Precautions / Restrictions Precautions Precautions: Fall;Knee Required Braces or Orthoses: Knee Immobilizer - Left Restrictions Weight Bearing Restrictions: Yes LLE Weight Bearing: Weight bearing as tolerated    Mobility  Bed Mobility Overal bed mobility: Modified Independent Bed Mobility: Supine to Sit     Supine to sit: Modified independent (Device/Increase time) (Increased time)        Transfers Overall transfer level: Needs assistance Equipment used: Rolling walker (2 wheeled) Transfers: Sit to/from Stand Sit to Stand: Min guard (Initially  cues for hands; cues for equal use BLEs)            Ambulation/Gait Ambulation/Gait assistance: Min guard Ambulation Distance (Feet): 270 Feet Assistive device: Rolling walker (2 wheeled) Gait Pattern/deviations: Step-through pattern;Decreased step length - right;Decreased stance time - left;Decreased dorsiflexion - left;Decreased weight shift to left (All improving with continued cueing/practice)   Gait velocity interpretation: Below normal speed for age/gender General Gait Details: Cues for decreased L step length and increased R for equal step lengths and more fluid ambulation. Cues for ww placement to body; initially steps too far into ww later with more fluid ambulation ww too far out at times. Improved with continued practice. Cues for increased heelstrike with good correction.    Stairs Stairs: Yes Stairs assistance: Min guard Stair Management: Two rails Number of Stairs: 6 General stair comments: Initial cues for sequence; good compliance/demonstration   Wheelchair Mobility    Modified Rankin (Stroke Patients Only)       Balance           Standing balance support: Bilateral upper extremity supported Standing balance-Leahy Scale: Fair                      Cognition Arousal/Alertness: Awake/alert Behavior During Therapy: WFL for tasks assessed/performed Overall Cognitive Status: Within Functional Limits for tasks assessed                      Exercises Total Joint Exercises Ankle Circles/Pumps: AROM;Both;20 reps;Supine Quad Sets: Strengthening;Both;20 reps;Supine Short Arc Quad: AROM;20 reps;Supine;Left Heel Slides: AAROM;Left;10 reps;Seated (3 positions each repetions with 10 second hold each) Straight Leg Raises: AAROM;Left;10 reps;Supine Long Arc Quad: AAROM;Left;10 reps;Seated Knee Flexion: AROM;Left;20 reps;Seated Goniometric ROM: -3 to 75  degrees    General Comments        Pertinent Vitals/Pain Pain Assessment: 0-10 Pain  Score: 2  Pain Location: L knee Pain Intervention(s): Monitored during session;Premedicated before session    Home Living                      Prior Function            PT Goals (current goals can now be found in the care plan section) Progress towards PT goals: Progressing toward goals    Frequency  BID    PT Plan Current plan remains appropriate    Co-evaluation             End of Session Equipment Utilized During Treatment: Gait belt;Left knee immobilizer Activity Tolerance: Patient tolerated treatment well Patient left: in chair;with call bell/phone within reach;with family/visitor present;with SCD's reapplied     Time: 1975-8832 PT Time Calculation (min) (ACUTE ONLY): 47 min  Charges:  $Gait Training: 23-37 mins $Therapeutic Exercise: 8-22 mins                    G Codes:      Charlaine Dalton 04/25/2015, 10:35 AM

## 2015-04-25 NOTE — Progress Notes (Signed)
Patient ID: Seth Byrd, male   DOB: 02/05/1946, 69 y.o.   MRN: [id number] Patient awake alert no distress. VS stable Tolerating PT well Probable transfer to home tomorrow.

## 2015-04-25 NOTE — Progress Notes (Addendum)
Physical Therapy Treatment Patient Details Name: Seth Byrd MRN: [id number] DOB: 07-11-1946 Today's Date: 04/25/2015    History of Present Illness Pt underwent L TKR and is currently POD#1. No complications reported. At baseline pt is fully independent and works. No falls in the last 12 months.     PT Comments    Pt continuing to progress well; demonstrating much improved QS this pm. Ambulation continues to start slow with step to pattern, but improves throughout session. Ambulation slow at this time. Pt/family has questions regarding exercises; all  Questions answered to there satisfaction. Improved use of LLE with transfers. Pt wishes to remain in legs dependent seated position to work on stretching and exercises a little longer. Will return to set pt up in long sit position with CP and foot pumps in place. Attempted return and pt in restroom with nursing; nursing with return pt to chair or bed and replace CP and foot pumps.  Follow Up Recommendations  Home health PT     Equipment Recommendations  None recommended by PT    Recommendations for Other Services       Precautions / Restrictions Precautions Precautions: Fall;Knee Restrictions Weight Bearing Restrictions: No LLE Weight Bearing: Weight bearing as tolerated    Mobility  Bed Mobility                  Transfers Overall transfer level: Needs assistance Equipment used: Rolling walker (2 wheeled) Transfers: Sit to/from Stand Sit to Stand: Min guard         General transfer comment: Improved use of LLE  Ambulation/Gait Ambulation/Gait assistance: Min guard Ambulation Distance (Feet): 260 Feet Assistive device: Rolling walker (2 wheeled) Gait Pattern/deviations: Step-through pattern   Gait velocity interpretation: Below normal speed for age/gender General Gait Details: Initially step to; improved with continued ambulation to step through with fluid use of rw   Stairs            Wheelchair  Mobility    Modified Rankin (Stroke Patients Only)       Balance           Standing balance support: Bilateral upper extremity supported Standing balance-Leahy Scale: Fair                      Cognition Arousal/Alertness: Awake/alert Behavior During Therapy: WFL for tasks assessed/performed Overall Cognitive Status: Within Functional Limits for tasks assessed                      Exercises Total Joint Exercises Ankle Circles/Pumps: AROM;Both;20 reps;Supine Quad Sets: Strengthening;Both;20 reps;Supine Gluteal Sets: Strengthening;Both;10 reps;Supine Short Arc Quad: AROM;20 reps;Left;Seated Heel Slides: AAROM;Left;10 reps;Seated Long Arc Quad: AAROM;Left;20 reps;Seated Marching in Standing: AROM;Left;20 reps;Seated Other Exercises Other Exercises: STS 4x    General Comments        Pertinent Vitals/Pain Pain Assessment: 0-10 Pain Score: 2  Pain Location: L knee Pain Descriptors / Indicators: Aching Pain Intervention(s): Monitored during session;Premedicated before session    Home Living                      Prior Function            PT Goals (current goals can now be found in the care plan section) Progress towards PT goals: Progressing toward goals    Frequency  BID    PT Plan Current plan remains appropriate    Co-evaluation  End of Session Equipment Utilized During Treatment: Gait belt Activity Tolerance: Patient tolerated treatment well Patient left: in chair;with call bell/phone within reach;with chair alarm set;with family/visitor present (Pt to continue knee flexion stretch/exercises; will return )     Time: 9381-8299 PT Time Calculation (min) (ACUTE ONLY): 44 min  Charges:  $Gait Training: 8-22 mins $Therapeutic Exercise: 23-37 mins                    G Codes:      Charlaine Dalton 04/25/2015, 2:25 PM

## 2015-04-25 NOTE — Care Management Note (Signed)
Case Management Note  Patient Details  Name: Seth Byrd MRN: [id number] Date of Birth: 12-23-1945  Subjective/Objective:    Plan is discharge home tomorrow if medically stable. Patient discharging to daughter's home, Kenney Houseman ( [place] [phone]). Will need PT. Spoke with daughter. They have no agency preference. Referral to [facility] for [facility]. Patient should discharge on [facility]. He has all DME.                               Action/Plan:   Expected Discharge Date:                  Expected Discharge Plan:  [place]  In-House Referral:     Discharge planning Services  CM Consult  Post Acute Care Choice:    Choice offered to:  Patient, Adult Children  DME Arranged:    DME Agency:     HH Arranged:    [place] Agency:     Status of Service:  In process, will continue to follow  Medicare Important Message Given:  Yes Date Medicare IM Given:  04/25/15 Medicare IM give by:  [place] July Date Additional Medicare IM Given:    Additional Medicare Important Message give by:     If discussed at [place] of Stay Meetings, dates discussed:    Additional Comments:  Jolly Mango, RN 04/25/2015, 12:16 PM

## 2015-04-26 MED ORDER — OXYCODONE-ACETAMINOPHEN 7.5-325 MG PO TABS: 1.0000 | ORAL_TABLET | ORAL | Status: DC | PRN

## 2015-04-26 MED ORDER — ASPIRIN EC 325 MG PO TBEC: 325.0000 mg | DELAYED_RELEASE_TABLET | Freq: Every day | ORAL | Status: DC

## 2015-04-26 NOTE — Progress Notes (Signed)
Completed discharge instructions with patient and family present.  No complaints at time of discharge.

## 2015-04-26 NOTE — Progress Notes (Signed)
Physical Therapy Treatment Patient Details Name: Seth Byrd MRN: [id number] DOB: [birth year] Today's Date: 04/26/2015    History of Present Illness POD3 L TKA, doing well     PT Comments    Pt continues to do well, needs reminders for posture and knee bend, pt very pleasant t/o session, eager to go home.  Follow Up Recommendations        Equipment Recommendations  None recommended by PT    Recommendations for Other Services       Precautions / Restrictions Precautions Precautions: Fall Restrictions Weight Bearing Restrictions: No LLE Weight Bearing: Weight bearing as tolerated    Mobility  Bed Mobility Overal bed mobility:  (pt up on recliner on arrival)                Transfers Overall transfer level: Modified independent Equipment used: Rolling walker (2 wheeled) Transfers: Sit to/from Stand Sit to Stand: Min guard (VCs for hand placement)            Ambulation/Gait Ambulation/Gait assistance: Min guard Ambulation Distance (Feet): 260 Feet Assistive device: Rolling walker (2 wheeled)           Stairs       Number of Stairs:  (4) General stair comments: pt does well with steps and remembers correct sequencing  Wheelchair Mobility    Modified Rankin (Stroke Patients Only)       Balance                                    Cognition Arousal/Alertness: Awake/alert Behavior During Therapy: WFL for tasks assessed/performed Overall Cognitive Status: Within Functional Limits for tasks assessed                      Exercises Total Joint Exercises Ankle Circles/Pumps: AROM;10 reps Quad Sets: Strengthening;10 reps Hip ABduction/ADduction: Strengthening;10 reps Long Arc Quad: Strengthening;10 reps Knee Flexion: PROM;10 reps    General Comments        Pertinent Vitals/Pain Pain Assessment: 0-10 Pain Score: 2  Pain Descriptors / Indicators:  (increases with activity)    Home Living                       Prior Function            PT Goals (current goals can now be found in the care plan section) Progress towards PT goals: Progressing toward goals    Frequency  BID    PT Plan Current plan remains appropriate    Co-evaluation             End of Session           Time: 8016-5537 PT Time Calculation (min) (ACUTE ONLY): 29 min  Charges:  $Gait Training: 8-22 mins $Therapeutic Exercise: 8-22 mins                    G Codes:     Wayne Both, PT, DPT [phone]  Kreg Shropshire 04/26/2015, 12:07 PM

## 2015-04-26 NOTE — Care Management Note (Signed)
Case Management Note  Patient Details  Name: Seth Byrd MRN: [id number] Date of Birth: 17-Aug-1946  Subjective/Objective:    [facility] notified of discharge and requested a call to the family asap to schedule home visit. Spoke with patient, updated and all questions answered.                 Action/Plan: Home with PT with [facility]  Expected Discharge Date:                  Expected Discharge Plan:  [place]  In-House Referral:     Discharge planning Services  CM Consult  Post Acute Care Choice:    Choice offered to:  Patient, Adult Children  DME Arranged:    DME Agency:     HH Arranged:  PT [place]:  [place]  Status of Service:  In process, will continue to follow  Medicare Important Message Given:  Yes Date Medicare IM Given:  04/25/15 Medicare IM give by:  Orvan July Date Additional Medicare IM Given:    Additional Medicare Important Message give by:     If discussed at [place] of Stay Meetings, dates discussed:    Additional Comments:  Jolly Mango, RN 04/26/2015, 11:28 AM

## 2015-04-27 NOTE — Discharge Summary (Signed)
NAMEGABOR, LUSK NO.:  [id number]  MEDICAL RECORD NO.:  [id number]  LOCATION:  156A                         FACILITY:  [facility]  PHYSICIAN:  Margaretmary Eddy, MD        DATE OF BIRTH:  16-May-1946  DATE OF ADMISSION:  04/23/2015 DATE OF DISCHARGE:  04/26/2015                              DISCHARGE SUMMARY   DISCHARGE DIAGNOSES: 1. Severe degenerative arthritis, left knee. 2. Hypertension.  OPERATION AND PROCEDURE PERFORMED:  Left total knee arthroplasty on April 23, 2015.  HISTORY AND PHYSICAL EXAMINATION:  Is as placed on the chart as a paper document.  LABORATORY DATA:  Is as noted in the chart.  COURSE IN THE HOSPITAL:  Following medical clearance, an appropriate informed consent, the patient was taken to the operating room on April 23, 2015, for left total knee arthroplasty which he tolerated quite well.  He has a completely benign postoperative course.  He was begun on physical therapy and was able to ambulate quite well and also range of motion of the knee was begun.  He is discharged on April 26, 2015, to his home with home health physical therapy.  He is discharged on his home medications except for enteric-coated aspirin 325 mg daily as prophylaxis for venous thrombosis and pulmonary embolism.  He is to follow up in the office in 10 days for possible staple removal.  Home health physical therapy has been ordered.  He also was given a prescription for Percocet 7.5/325 one tablet every 6 hours as necessary for pain.          ______________________________ Margaretmary Eddy, MD     CS/MEDQ  D:  04/26/2015  T:  04/27/2015  Job:  [id number]

## 2015-06-23 ENCOUNTER — Other Ambulatory Visit: Payer: Self-pay | Admitting: Family Medicine

## 2015-06-25 ENCOUNTER — Other Ambulatory Visit: Payer: Self-pay

## 2015-06-25 MED ORDER — MELOXICAM 7.5 MG PO TABS: 7.5000 mg | ORAL_TABLET | Freq: Two times a day (BID) | ORAL | Status: DC

## 2015-06-26 ENCOUNTER — Other Ambulatory Visit: Payer: Self-pay

## 2015-07-29 ENCOUNTER — Encounter: Payer: Self-pay | Admitting: Family Medicine

## 2015-07-31 ENCOUNTER — Encounter: Payer: Self-pay | Admitting: Family Medicine

## 2015-07-31 ENCOUNTER — Ambulatory Visit (INDEPENDENT_AMBULATORY_CARE_PROVIDER_SITE_OTHER): Payer: 59 | Admitting: Family Medicine

## 2015-07-31 VITALS — BP 128/78 | HR 84 | Temp 98.3°F | Resp 16 | Ht 71.0 in | Wt 192.1 lb

## 2015-07-31 DIAGNOSIS — Z23 Encounter for immunization: Secondary | ICD-10-CM

## 2015-07-31 DIAGNOSIS — Z0000 Encounter for general adult medical examination without abnormal findings: Secondary | ICD-10-CM | POA: Diagnosis not present

## 2015-07-31 NOTE — Progress Notes (Signed)
Name: Seth Byrd   MRN: [id number]    DOB: [birth year]   Date:07/31/2015       Progress Note  Subjective  Chief Complaint  Chief Complaint  Patient presents with  . Annual Exam    HPI  Patient presents for annual H&P. No significant new problems have developed.  Depression screen PHQ 2/9 07/31/2015  Decreased Interest 0  Down, Depressed, Hopeless 0  PHQ - 2 Score 0    Functional Status Survey: Is the patient deaf or have difficulty hearing?: No Does the patient have difficulty seeing, even when wearing glasses/contacts?: No Does the patient have difficulty concentrating, remembering, or making decisions?: No Does the patient have difficulty walking or climbing stairs?: No Does the patient have difficulty dressing or bathing?: No Does the patient have difficulty doing errands alone such as visiting a doctor's office or shopping?: No  Fall Risk  07/31/2015  Falls in the past year? No     Past Medical History  Diagnosis Date  . Hypertension   . Arthritis     Social History  Substance Use Topics  . Smoking status: Former Smoker -- 1.50 packs/day    Types: Cigarettes    Quit date: 04/09/2005  . Smokeless tobacco: Never Used  . Alcohol Use: No     Current outpatient prescriptions:  .  acetaminophen (TYLENOL) 500 MG tablet, Take 500 mg by mouth every 6 (six) hours as needed., Disp: , Rfl:  .  aspirin EC 325 MG tablet, Take 1 tablet (325 mg total) by mouth daily., Disp: 30 tablet, Rfl: 0 .  etanercept (ENBREL) 50 MG/ML injection, Inject 50 mg into the skin once a week. Saturday, Disp: , Rfl:  .  meloxicam (MOBIC) 7.5 MG tablet, Take 1 tablet (7.5 mg total) by mouth 2 (two) times daily., Disp: 60 tablet, Rfl: 0 .  Multiple Vitamins-Minerals (ONE-A-DAY MENS 50+ ADVANTAGE PO), Take 1 tablet by mouth daily., Disp: , Rfl:  .  Omega-3 Fatty Acids (FISH OIL) 1200 MG CAPS, Take 1 capsule by mouth 3 (three) times daily., Disp: , Rfl:  .  oxyCODONE-acetaminophen (PERCOCET)  7.5-325 MG per tablet, Take 1 tablet by mouth every 4 (four) hours as needed for severe pain., Disp: 30 tablet, Rfl: 0 .  triamterene-hydrochlorothiazide (MAXZIDE-25) 37.5-25 MG per tablet, Take 1 tablet by mouth daily., Disp: , Rfl:  .  vitamin B-12 (CYANOCOBALAMIN) 1000 MCG tablet, Take 5,000 mcg by mouth daily., Disp: , Rfl:   No Known Allergies  Review of Systems  Constitutional: Negative for fever, chills and weight loss.  HENT: Negative for congestion, hearing loss, sore throat and tinnitus.   Eyes: Negative for blurred vision, double vision and redness.  Respiratory: Negative for cough, hemoptysis and shortness of breath.   Cardiovascular: Negative for chest pain, palpitations, orthopnea, claudication and leg swelling.  Gastrointestinal: Negative for heartburn, nausea, vomiting, diarrhea, constipation and blood in stool.  Genitourinary: Negative for dysuria, urgency, frequency and hematuria.  Musculoskeletal: Positive for joint pain. Negative for myalgias, back pain, falls and neck pain.  Skin: Negative for itching.  Neurological: Negative for dizziness, tingling, tremors, focal weakness, seizures, loss of consciousness, weakness and headaches.  Endo/Heme/Allergies: Does not bruise/bleed easily.  Psychiatric/Behavioral: Negative for depression and substance abuse. The patient is not nervous/anxious and does not have insomnia.      Objective  Filed Vitals:   07/31/15 1139  BP: 128/78  Pulse: 84  Temp: 98.3 F (36.8 C)  Resp: 16  Height: 5\' 11"  (1.803 m)  Weight: 192 lb 2 oz (87.147 kg)  SpO2: 96%     Physical Exam  Constitutional: He is oriented to person, place, and time and well-developed, well-nourished, and in no distress.  HENT:  Head: Normocephalic.  Eyes: EOM are normal. Pupils are equal, round, and reactive to light.  Neck: Normal range of motion. Neck supple. No thyromegaly present.  Cardiovascular: Normal rate, regular rhythm and normal heart sounds.   No  murmur heard. Pulmonary/Chest: Effort normal and breath sounds normal. No respiratory distress. He has no wheezes.  Abdominal: Soft. Bowel sounds are normal.  Genitourinary: Rectum normal, prostate normal and penis normal. Guaiac negative stool. No discharge found.  Musculoskeletal: Normal range of motion. He exhibits no edema.  Lymphadenopathy:    He has no cervical adenopathy.  Neurological: He is alert and oriented to person, place, and time. No cranial nerve deficit. Gait normal. Coordination normal.  Skin: Skin is warm and dry. No rash noted.  Psychiatric: Affect and judgment normal.      Assessment & Plan   1. Annual physical exam  - POC Hemoccult Bld/Stl (1-Cd Office Dx) - CBC - Comprehensive Metabolic Panel (CMET) - Lipid Profile - TSH  2. Need for influenza vaccination Given today - Flu vaccine HIGH DOSE PF (Fluzone High dose)  3. Need for pneumococcal vaccination Given today - Pneumococcal conjugate vaccine 13-valent

## 2015-08-01 LAB — COMPREHENSIVE METABOLIC PANEL
A/G RATIO: 1.4 (ref 1.1–2.5)
ALT: 42 IU/L (ref 0–44)
AST: 33 IU/L (ref 0–40)
Albumin: 4.6 g/dL (ref 3.6–4.8)
Alkaline Phosphatase: 76 IU/L (ref 39–117)
BILIRUBIN TOTAL: 0.9 mg/dL (ref 0.0–1.2)
BUN/Creatinine Ratio: 20 (ref 10–22)
BUN: 22 mg/dL (ref 8–27)
CHLORIDE: 98 mmol/L (ref 97–108)
CO2: 25 mmol/L (ref 18–29)
Calcium: 10.7 mg/dL — ABNORMAL HIGH (ref 8.6–10.2)
Creatinine, Ser: 1.12 mg/dL (ref 0.76–1.27)
GFR calc Af Amer: 78 mL/min/{1.73_m2} (ref >59)
GFR, EST NON AFRICAN AMERICAN: 67 mL/min/{1.73_m2} (ref >59)
GLOBULIN, TOTAL: 3.2 g/dL (ref 1.5–4.5)
Glucose: 82 mg/dL (ref 65–99)
POTASSIUM: 4.6 mmol/L (ref 3.5–5.2)
SODIUM: 140 mmol/L (ref 134–144)
Total Protein: 7.8 g/dL (ref 6.0–8.5)

## 2015-08-01 LAB — CBC
Hematocrit: 40.4 % (ref 37.5–51.0)
Hemoglobin: 14 g/dL (ref 12.6–17.7)
MCH: 31.8 pg (ref 26.6–33.0)
MCHC: 34.7 g/dL (ref 31.5–35.7)
MCV: 92 fL (ref 79–97)
Platelets: 195 10*3/uL (ref 150–379)
RBC: 4.4 x10E6/uL (ref 4.14–5.80)
RDW: 12.9 % (ref 12.3–15.4)
WBC: 5.7 10*3/uL (ref 3.4–10.8)

## 2015-08-01 LAB — TSH: TSH: 2.8 u[IU]/mL (ref 0.450–4.500)

## 2015-08-01 LAB — LIPID PANEL
CHOL/HDL RATIO: 4.3 ratio (ref 0.0–5.0)
Cholesterol, Total: 185 mg/dL (ref 100–199)
HDL: 43 mg/dL (ref >39)
LDL Calculated: 115 mg/dL — ABNORMAL HIGH (ref 0–99)
Triglycerides: 136 mg/dL (ref 0–149)
VLDL Cholesterol Cal: 27 mg/dL (ref 5–40)

## 2015-08-06 ENCOUNTER — Encounter: Payer: Self-pay | Admitting: Family Medicine

## 2015-08-07 ENCOUNTER — Telehealth: Payer: Self-pay | Admitting: Family Medicine

## 2015-08-07 NOTE — Telephone Encounter (Signed)
Patient daugter Seth Byrd) reqeusting return call. Moustafa was told to stop taking Maxzide and he nor his daughter is not clear as to why he was told to stop taking it. Please clarify.

## 2015-08-07 NOTE — Telephone Encounter (Signed)
Spoke with daughter concerning taking her father off of maxzide.

## 2015-08-29 ENCOUNTER — Other Ambulatory Visit: Payer: Self-pay | Admitting: Family Medicine

## 2015-09-04 ENCOUNTER — Encounter: Payer: Self-pay | Admitting: Family Medicine

## 2015-09-04 ENCOUNTER — Ambulatory Visit (INDEPENDENT_AMBULATORY_CARE_PROVIDER_SITE_OTHER): Payer: 59 | Admitting: Family Medicine

## 2015-09-04 DIAGNOSIS — E785 Hyperlipidemia, unspecified: Secondary | ICD-10-CM

## 2015-09-04 DIAGNOSIS — J410 Simple chronic bronchitis: Secondary | ICD-10-CM | POA: Diagnosis not present

## 2015-09-04 DIAGNOSIS — M25569 Pain in unspecified knee: Secondary | ICD-10-CM

## 2015-09-04 DIAGNOSIS — I10 Essential (primary) hypertension: Secondary | ICD-10-CM | POA: Diagnosis not present

## 2015-09-04 MED ORDER — MELOXICAM 7.5 MG PO TABS: 7.5000 mg | ORAL_TABLET | Freq: Two times a day (BID) | ORAL | Status: DC

## 2015-09-04 MED ORDER — LOVASTATIN 20 MG PO TABS: 20.0000 mg | ORAL_TABLET | Freq: Every day | ORAL | Status: DC

## 2015-09-04 NOTE — Progress Notes (Signed)
Name: Seth Byrd   MRN: [id number]    DOB: [birth year]   Date:09/04/2015       Progress Note  Subjective  Chief Complaint  Chief Complaint  Patient presents with  . Hypertension    4 month follow up   . Hyperlipidemia  . Knee Pain    HPI   Hypercalcemia  Patient was noted on routine lab work to have an elevated calcium 10.7. His diuretic was discontinued. He had not experienced any leg cramps or palpitations change in his sensory system.  Hypertension   Patient presents for follow-up of hypertension. It has been present for over 5 years.  Patient states that there is compliance with medical regimen which consists of Maxide in the past this was discontinued. Past this was discontinued. There is no end organ disease. Cardiac risk factors include hypertension hyperlipidemia and diabetes.  Exercise regimen consist of walking .  Diet consist of sodium restriction .  Hyperlipidemia  Patient has a history of hyperlipidemia for over 5 years.  Current medical regimen consist of lovastatin 20 mg daily at bedtime .  Compliance is good .  Diet and exercise are currently followed well .  Risk factors for cardiovascular disease include hyperlipidemia hypertension .   There have been no side effects from the medication.    Knee pain  Patient has several months status post knee surgery. However he continues with some discomfort on medication at times. He is to the care of his orthopedist   COPD  Allergies no longer smoker. He is currently on a regimen tobacco avoidance is not a regular inhaler.      Past Medical History  Diagnosis Date  . Hypertension   . Arthritis     Social History  Substance Use Topics  . Smoking status: Former Smoker -- 1.50 packs/day    Types: Cigarettes    Quit date: 04/09/2005  . Smokeless tobacco: Never Used  . Alcohol Use: No     Current outpatient prescriptions:  .  acetaminophen (TYLENOL) 500 MG tablet, Take 500 mg by mouth every 6 (six) hours  as needed., Disp: , Rfl:  .  aspirin EC 325 MG tablet, Take 1 tablet (325 mg total) by mouth daily., Disp: 30 tablet, Rfl: 0 .  etanercept (ENBREL) 50 MG/ML injection, Inject 50 mg into the skin once a week. Saturday, Disp: , Rfl:  .  lovastatin (MEVACOR) 20 MG tablet, Take 1 tablet (20 mg total) by mouth at bedtime., Disp: 90 tablet, Rfl: 1 .  meloxicam (MOBIC) 7.5 MG tablet, Take 1 tablet (7.5 mg total) by mouth 2 (two) times daily., Disp: 60 tablet, Rfl: 5 .  Multiple Vitamins-Minerals (ONE-A-DAY MENS 50+ ADVANTAGE PO), Take 1 tablet by mouth daily., Disp: , Rfl:  .  Omega-3 Fatty Acids (FISH OIL) 1200 MG CAPS, Take 1 capsule by mouth 3 (three) times daily., Disp: , Rfl:  .  oxyCODONE-acetaminophen (PERCOCET) 7.5-325 MG per tablet, Take 1 tablet by mouth every 4 (four) hours as needed for severe pain., Disp: 30 tablet, Rfl: 0 .  triamterene-hydrochlorothiazide (MAXZIDE-25) 37.5-25 MG per tablet, Take 1 tablet by mouth daily., Disp: , Rfl:  .  vitamin B-12 (CYANOCOBALAMIN) 1000 MCG tablet, Take 5,000 mcg by mouth daily., Disp: , Rfl:   No Known Allergies  Review of Systems  Constitutional: Negative for fever, chills and weight loss.  HENT: Negative for congestion, hearing loss, sore throat and tinnitus.   Eyes: Negative for blurred vision, double vision and redness.  Respiratory:  Negative for cough, hemoptysis and shortness of breath.   Cardiovascular: Negative for chest pain, palpitations, orthopnea, claudication and leg swelling.  Gastrointestinal: Negative for heartburn, nausea, vomiting, diarrhea, constipation and blood in stool.  Genitourinary: Negative for dysuria, urgency, frequency and hematuria.  Musculoskeletal: Positive for joint pain. Negative for myalgias, back pain, falls and neck pain.  Skin: Negative for itching.  Neurological: Negative for dizziness, tingling, tremors, focal weakness, seizures, loss of consciousness, weakness and headaches.  Endo/Heme/Allergies: Does not  bruise/bleed easily.  Psychiatric/Behavioral: Negative for depression and substance abuse. The patient is not nervous/anxious and does not have insomnia.      Objective  There were no vitals filed for this visit.   Physical Exam  Constitutional: He is oriented to person, place, and time and well-developed, well-nourished, and in no distress.  HENT:  Head: Normocephalic.  Eyes: EOM are normal. Pupils are equal, round, and reactive to light.  Neck: Normal range of motion. Neck supple. No thyromegaly present.  Cardiovascular: Normal rate, regular rhythm and normal heart sounds.   No murmur heard. Pulmonary/Chest: Effort normal and breath sounds normal. No respiratory distress. He has no wheezes.  Abdominal: Soft. Bowel sounds are normal.  Musculoskeletal: He exhibits no edema.  Well-healed surgical scars involving the  Lymphadenopathy:    He has no cervical adenopathy.  Neurological: He is alert and oriented to person, place, and time. No cranial nerve deficit. Gait normal. Coordination normal.  Skin: Skin is warm and dry. No rash noted.  Psychiatric: Affect and judgment normal.      Assessment & Plan  1. Hypercalcemia Probably secondary to diuretic. If remains elevated we will obtain parathyroid hormones well  2. Essential hypertension Well-controlled - Comprehensive Metabolic Panel (CMET)  3. Hyperlipidemia Well-controlled  4. Knee pain, unspecified laterality The care of orthopedist  5. Simple chronic bronchitis ([place]) Avoid tobacco

## 2015-09-05 LAB — COMPREHENSIVE METABOLIC PANEL
ALK PHOS: 76 IU/L (ref 39–117)
ALT: 33 IU/L (ref 0–44)
AST: 30 IU/L (ref 0–40)
Albumin/Globulin Ratio: 1.6 (ref 1.1–2.5)
Albumin: 4.5 g/dL (ref 3.6–4.8)
BUN / CREAT RATIO: 17 (ref 10–22)
BUN: 15 mg/dL (ref 8–27)
Bilirubin Total: 0.7 mg/dL (ref 0.0–1.2)
CHLORIDE: 105 mmol/L (ref 97–106)
CO2: 25 mmol/L (ref 18–29)
Calcium: 9.9 mg/dL (ref 8.6–10.2)
Creatinine, Ser: 0.9 mg/dL (ref 0.76–1.27)
GFR calc non Af Amer: 87 mL/min/{1.73_m2} (ref >59)
GFR, EST AFRICAN AMERICAN: 101 mL/min/{1.73_m2} (ref >59)
GLUCOSE: 84 mg/dL (ref 65–99)
Globulin, Total: 2.9 g/dL (ref 1.5–4.5)
POTASSIUM: 4.4 mmol/L (ref 3.5–5.2)
Sodium: 144 mmol/L (ref 136–144)
Total Protein: 7.4 g/dL (ref 6.0–8.5)

## 2015-09-19 ENCOUNTER — Telehealth: Payer: Self-pay | Admitting: Emergency Medicine

## 2015-09-19 NOTE — Telephone Encounter (Signed)
Patient notified of lab results

## 2015-11-25 ENCOUNTER — Encounter: Payer: Self-pay | Admitting: Family Medicine

## 2015-11-25 ENCOUNTER — Ambulatory Visit (INDEPENDENT_AMBULATORY_CARE_PROVIDER_SITE_OTHER): Payer: 59 | Admitting: Family Medicine

## 2015-11-25 VITALS — BP 128/78 | HR 72 | Temp 98.9°F | Resp 18 | Ht 71.0 in | Wt 201.0 lb

## 2015-11-25 DIAGNOSIS — I10 Essential (primary) hypertension: Secondary | ICD-10-CM

## 2015-11-25 DIAGNOSIS — E785 Hyperlipidemia, unspecified: Secondary | ICD-10-CM

## 2015-11-25 DIAGNOSIS — M150 Primary generalized (osteo)arthritis: Secondary | ICD-10-CM | POA: Diagnosis not present

## 2015-11-25 DIAGNOSIS — J431 Panlobular emphysema: Secondary | ICD-10-CM | POA: Insufficient documentation

## 2015-11-25 DIAGNOSIS — M159 Polyosteoarthritis, unspecified: Secondary | ICD-10-CM | POA: Insufficient documentation

## 2015-11-25 DIAGNOSIS — Z96652 Presence of left artificial knee joint: Secondary | ICD-10-CM | POA: Diagnosis not present

## 2015-11-25 MED ORDER — MELOXICAM 15 MG PO TABS: 15.0000 mg | ORAL_TABLET | Freq: Every day | ORAL | Status: DC

## 2015-11-25 NOTE — Progress Notes (Signed)
Name: Seth Byrd   MRN: [id number]    DOB: May 08, 1946   Date:11/25/2015       Progress Note  Subjective  Chief Complaint  No chief complaint on file.   HPI  Hypertension   Patient presents for follow-up of hypertension. It has been present for over 5 diet and exercise only years.  Patient states that there is compliance with medical regimen which consists of diet and exercise . There is no end organ disease. Cardiac risk factors include hypertension hyperlipidemia and diabetes.  Exercise regimen consist of some walking .  Diet consist of salt restriction .  Hyperlipidemia  Patient has a history of hyperlipidemia for over 5 years.  Current medical regimen consist of lovastatin 20 mg daily at bedtime .  Compliance is good .  Diet and exercise are currently followed low-fat general .  Risk factors for cardiovascular disease include hyperlipidemia 8 .   There have been no side effects from the medication.    Arthritis history of present illness  Patient still has some joint discomfort in his knees even after surgery. Occasionally using over-the-counter Tylenol for this.    Past Medical History  Diagnosis Date  . Hypertension   . Arthritis     Social History  Substance Use Topics  . Smoking status: Former Smoker -- 1.50 packs/day    Types: Cigarettes    Quit date: 04/09/2005  . Smokeless tobacco: Never Used  . Alcohol Use: No     Current outpatient prescriptions:  .  acetaminophen (TYLENOL) 500 MG tablet, Take 500 mg by mouth every 6 (six) hours as needed., Disp: , Rfl:  .  aspirin EC 325 MG tablet, Take 1 tablet (325 mg total) by mouth daily., Disp: 30 tablet, Rfl: 0 .  etanercept (ENBREL) 50 MG/ML injection, Inject 50 mg into the skin once a week. Saturday, Disp: , Rfl:  .  lovastatin (MEVACOR) 20 MG tablet, Take 1 tablet (20 mg total) by mouth at bedtime., Disp: 90 tablet, Rfl: 1 .  meloxicam (MOBIC) 7.5 MG tablet, Take 1 tablet (7.5 mg total) by mouth 2 (two) times  daily., Disp: 60 tablet, Rfl: 5 .  Multiple Vitamins-Minerals (ONE-A-DAY MENS 50+ ADVANTAGE PO), Take 1 tablet by mouth daily., Disp: , Rfl:  .  Omega-3 Fatty Acids (FISH OIL) 1200 MG CAPS, Take 1 capsule by mouth 3 (three) times daily., Disp: , Rfl:  .  oxyCODONE-acetaminophen (PERCOCET) 7.5-325 MG per tablet, Take 1 tablet by mouth every 4 (four) hours as needed for severe pain., Disp: 30 tablet, Rfl: 0 .  vitamin B-12 (CYANOCOBALAMIN) 1000 MCG tablet, Take 5,000 mcg by mouth daily., Disp: , Rfl:   No Known Allergies  Review of Systems  Constitutional: Negative for fever, chills and weight loss.  HENT: Negative for congestion, hearing loss, sore throat and tinnitus.   Eyes: Negative for blurred vision, double vision and redness.  Respiratory: Negative for cough, hemoptysis and shortness of breath.   Cardiovascular: Negative for chest pain, palpitations, orthopnea, claudication and leg swelling.  Gastrointestinal: Negative for heartburn, nausea, vomiting, diarrhea, constipation and blood in stool.  Genitourinary: Negative for dysuria, urgency, frequency and hematuria.  Musculoskeletal: Negative for myalgias, back pain, joint pain, falls and neck pain.  Skin: Positive for rash. Negative for itching.  Neurological: Negative for dizziness, tingling, tremors, focal weakness, seizures, loss of consciousness, weakness and headaches.  Endo/Heme/Allergies: Does not bruise/bleed easily.  Psychiatric/Behavioral: Negative for depression and substance abuse. The patient is not nervous/anxious and does not  have insomnia.      Objective  Filed Vitals:   11/25/15 0915  BP: 128/78  Pulse: 72  Temp: 98.9 F (37.2 C)  Resp: 18  Height: 5\' 11"  (1.803 m)  Weight: 201 lb (91.173 kg)  SpO2: 93%     Physical Exam  Constitutional: He is oriented to person, place, and time and well-developed, well-nourished, and in no distress.  HENT:  Head: Normocephalic.  Eyes: EOM are normal. Pupils are equal,  round, and reactive to light.  Neck: Normal range of motion. Neck supple. No thyromegaly present.  Cardiovascular: Normal rate, regular rhythm and normal heart sounds.   No murmur heard. Pulmonary/Chest: Effort normal and breath sounds normal. No respiratory distress. He has no wheezes.  Abdominal: Soft. Bowel sounds are normal.  Musculoskeletal: He exhibits no edema.  Scar from total knee replacement and some arthritic changes are noted  Lymphadenopathy:    He has no cervical adenopathy.  Neurological: He is alert and oriented to person, place, and time. No cranial nerve deficit. Gait normal. Coordination normal.  Skin: Skin is warm and dry. Rash (psoriatic rash) noted.  Psychiatric: Affect and judgment normal.      Assessment & Plan   1. Essential hypertension Well-controlled  2. Hyperlipidemia Labs today - Comprehensive Metabolic Panel (CMET) - Lipid panel - TSH  3. Panlobular emphysema ([place]) Patient has discontinued smoking  4. Primary osteoarthritis involving multiple joints Meloxicam 7.5 twice a day to 15 once daily - meloxicam (MOBIC) 15 MG tablet; Take 1 tablet (15 mg total) by mouth daily.  Dispense: 30 tablet; Refill: 0  5. Status post total left knee replacement As below - meloxicam (MOBIC) 15 MG tablet; Take 1 tablet (15 mg total) by mouth daily.  Dispense: 30 tablet; Refill: 0

## 2015-11-26 LAB — COMPREHENSIVE METABOLIC PANEL
A/G RATIO: 1.4 (ref 1.1–2.5)
ALBUMIN: 4.5 g/dL (ref 3.6–4.8)
ALT: 30 IU/L (ref 0–44)
AST: 29 IU/L (ref 0–40)
Alkaline Phosphatase: 82 IU/L (ref 39–117)
BUN/Creatinine Ratio: 14 (ref 10–22)
BUN: 12 mg/dL (ref 8–27)
Bilirubin Total: 1.2 mg/dL (ref 0.0–1.2)
CALCIUM: 10 mg/dL (ref 8.6–10.2)
CO2: 23 mmol/L (ref 18–29)
Chloride: 99 mmol/L (ref 96–106)
Creatinine, Ser: 0.88 mg/dL (ref 0.76–1.27)
GFR, EST AFRICAN AMERICAN: 101 mL/min/{1.73_m2} (ref >59)
GFR, EST NON AFRICAN AMERICAN: 88 mL/min/{1.73_m2} (ref >59)
GLOBULIN, TOTAL: 3.2 g/dL (ref 1.5–4.5)
Glucose: 87 mg/dL (ref 65–99)
Potassium: 4.5 mmol/L (ref 3.5–5.2)
Sodium: 139 mmol/L (ref 134–144)
TOTAL PROTEIN: 7.7 g/dL (ref 6.0–8.5)

## 2015-11-26 LAB — LIPID PANEL
CHOL/HDL RATIO: 4.2 ratio (ref 0.0–5.0)
CHOLESTEROL TOTAL: 179 mg/dL (ref 100–199)
HDL: 43 mg/dL (ref >39)
LDL CALC: 113 mg/dL — AB (ref 0–99)
TRIGLYCERIDES: 116 mg/dL (ref 0–149)
VLDL Cholesterol Cal: 23 mg/dL (ref 5–40)

## 2015-11-26 LAB — TSH: TSH: 2.56 u[IU]/mL (ref 0.450–4.500)

## 2016-04-24 ENCOUNTER — Other Ambulatory Visit: Payer: Self-pay | Admitting: Family Medicine

## 2016-05-22 ENCOUNTER — Ambulatory Visit: Payer: 59 | Admitting: Family Medicine

## 2016-05-22 ENCOUNTER — Ambulatory Visit (INDEPENDENT_AMBULATORY_CARE_PROVIDER_SITE_OTHER): Payer: 59 | Admitting: Family Medicine

## 2016-05-22 ENCOUNTER — Encounter: Payer: Self-pay | Admitting: Family Medicine

## 2016-05-22 VITALS — BP 124/82 | HR 85 | Temp 98.7°F | Resp 16 | Ht 71.0 in | Wt 196.9 lb

## 2016-05-22 DIAGNOSIS — M1712 Unilateral primary osteoarthritis, left knee: Secondary | ICD-10-CM | POA: Diagnosis not present

## 2016-05-22 DIAGNOSIS — E785 Hyperlipidemia, unspecified: Secondary | ICD-10-CM

## 2016-05-22 MED ORDER — LOVASTATIN 20 MG PO TABS: 20.0000 mg | ORAL_TABLET | Freq: Every day | ORAL | Status: DC

## 2016-05-22 MED ORDER — MELOXICAM 15 MG PO TABS: 15.0000 mg | ORAL_TABLET | Freq: Every day | ORAL | Status: DC

## 2016-05-22 NOTE — Progress Notes (Signed)
Name: Seth Byrd   MRN: [id number]    DOB: November 25, 1945   Date:05/22/2016       Progress Note  Subjective  Chief Complaint  Chief Complaint  Patient presents with  . Spasms    refill Meloxicam  . Hyperlipidemia    med refill  This patient is usually followed by Dr. Rutherford Nail, new to me.   Hyperlipidemia This is a chronic problem. The problem is uncontrolled. Recent lipid tests were reviewed and are high. Pertinent negatives include no chest pain, leg pain or myalgias. Current antihyperlipidemic treatment includes statins.    Arthritis involving multiple joints: Patient has arthritis involving multiple joints including elbows, left knee (s/p TKA), and cervical spine. He is being followed by Rheumatology and is on Enbrel alongwith Meloxicam for anti-inflammatory purposes. He reports Meloxicam does help control his pain. No hsitory of GIB, MI, or kidney disease.  Past Medical History  Diagnosis Date  . Hypertension   . Arthritis     Past Surgical History  Procedure Laterality Date  . Back surgery      Spinal Fusion with 2 screws  . Joint replacement    . Total knee arthroplasty Left 04/23/2015    Procedure: TOTAL KNEE ARTHROPLASTY;  Surgeon: Christophe Louis, MD;  Location: [facility] ORS;  Service: Orthopedics;  Laterality: Left;    Family History  Problem Relation Age of Onset  . Hypertension Mother   . Transient ischemic attack Mother   . CVA Mother   . Colon cancer Father   . Hypertension Father   . Diabetes Brother     Social History   Social History  . Marital Status: Married    Spouse Name: N/A  . Number of Children: N/A  . Years of Education: N/A   Occupational History  . Not on file.   Social History Main Topics  . Smoking status: Former Smoker -- 1.50 packs/day    Types: Cigarettes    Quit date: 04/09/2005  . Smokeless tobacco: Never Used  . Alcohol Use: No  . Drug Use: No  . Sexual Activity: Not on file   Other Topics Concern  . Not on file    Social History Narrative     Current outpatient prescriptions:  .  acetaminophen (TYLENOL) 500 MG tablet, Take 500 mg by mouth every 6 (six) hours as needed., Disp: , Rfl:  .  aspirin EC 325 MG tablet, Take 1 tablet (325 mg total) by mouth daily., Disp: 30 tablet, Rfl: 0 .  etanercept (ENBREL) 50 MG/ML injection, Inject 50 mg into the skin once a week. Saturday, Disp: , Rfl:  .  lovastatin (MEVACOR) 20 MG tablet, Take 1 tablet (20 mg total) by mouth at bedtime., Disp: 90 tablet, Rfl: 1 .  meloxicam (MOBIC) 15 MG tablet, TAKE ONE TABLET BY MOUTH ONCE DAILY, Disp: 30 tablet, Rfl: 0 .  Multiple Vitamins-Minerals (ONE-A-DAY MENS 50+ ADVANTAGE PO), Take 1 tablet by mouth daily., Disp: , Rfl:  .  Omega-3 Fatty Acids (FISH OIL) 1200 MG CAPS, Take 1 capsule by mouth 3 (three) times daily., Disp: , Rfl:  .  vitamin B-12 (CYANOCOBALAMIN) 1000 MCG tablet, Take 5,000 mcg by mouth daily., Disp: , Rfl:   No Known Allergies   Review of Systems  Constitutional: Negative for fever and chills.  Cardiovascular: Negative for chest pain.  Gastrointestinal: Negative for nausea, vomiting and abdominal pain.  Musculoskeletal: Positive for joint pain and neck pain. Negative for myalgias.    Objective  Filed Vitals:  05/22/16 0835  BP: 124/82  Pulse: 85  Temp: 98.7 F (37.1 C)  TempSrc: Oral  Resp: 16  Height: 5\' 11"  (1.803 m)  Weight: 196 lb 14.4 oz (89.313 kg)  SpO2: 97%    Physical Exam  Constitutional: He is oriented to person, place, and time and well-developed, well-nourished, and in no distress.  HENT:  Head: Normocephalic and atraumatic.  Cardiovascular: Normal rate, regular rhythm and normal heart sounds.   Pulmonary/Chest: Effort normal and breath sounds normal.  Abdominal: Soft. Bowel sounds are normal. There is no tenderness.  Musculoskeletal:       Left knee: He exhibits no swelling. No tenderness found.  Neurological: He is alert and oriented to person, place, and time.   Psychiatric: Mood, memory, affect and judgment normal.  Nursing note and vitals reviewed.       Assessment & Plan  1. Primary osteoarthritis of knee, left Takes meloxicam as needed, refills provided - meloxicam (MOBIC) 15 MG tablet; Take 1 tablet (15 mg total) by mouth daily.  Dispense: 90 tablet; Refill: 0  2. Hyperlipidemia Elevated LDL, recheck today, refills for lovastatin provided - lovastatin (MEVACOR) 20 MG tablet; Take 1 tablet (20 mg total) by mouth at bedtime.  Dispense: 90 tablet; Refill: 1 - Lipid Profile   Karle Desrosier [facility] 05/22/2016 8:48 AM

## 2016-05-30 LAB — LIPID PANEL
CHOL/HDL RATIO: 3.3 ratio (ref 0.0–5.0)
Cholesterol, Total: 139 mg/dL (ref 100–199)
HDL: 42 mg/dL (ref >39)
LDL Calculated: 80 mg/dL (ref 0–99)
TRIGLYCERIDES: 85 mg/dL (ref 0–149)
VLDL CHOLESTEROL CAL: 17 mg/dL (ref 5–40)

## 2016-06-03 NOTE — Progress Notes (Signed)
Patient notified of lab results

## 2016-07-31 ENCOUNTER — Encounter: Payer: 59 | Admitting: Family Medicine

## 2016-08-07 ENCOUNTER — Encounter: Payer: Self-pay | Admitting: Family Medicine

## 2016-08-07 ENCOUNTER — Ambulatory Visit (INDEPENDENT_AMBULATORY_CARE_PROVIDER_SITE_OTHER): Payer: 59 | Admitting: Family Medicine

## 2016-08-07 ENCOUNTER — Other Ambulatory Visit: Payer: Self-pay | Admitting: Family Medicine

## 2016-08-07 VITALS — BP 120/73 | HR 85 | Temp 98.2°F | Resp 15 | Ht 71.0 in | Wt 195.3 lb

## 2016-08-07 DIAGNOSIS — Z23 Encounter for immunization: Secondary | ICD-10-CM

## 2016-08-07 DIAGNOSIS — Z0000 Encounter for general adult medical examination without abnormal findings: Secondary | ICD-10-CM

## 2016-08-07 NOTE — Progress Notes (Signed)
Progress Note  Subjective  Chief Complaint  Chief Complaint  Patient presents with  . Annual Exam    CPE    HPI  Pt. Presents for annual physical exam.   Past Medical History:  Diagnosis Date  . Arthritis   . Hypertension     Past Surgical History:  Procedure Laterality Date  . BACK SURGERY     Spinal Fusion with 2 screws  . JOINT REPLACEMENT    . TOTAL KNEE ARTHROPLASTY Left 04/23/2015   Procedure: TOTAL KNEE ARTHROPLASTY;  Surgeon: Christophe Louis, MD;  Location: [facility] ORS;  Service: Orthopedics;  Laterality: Left;    Family History  Problem Relation Age of Onset  . Hypertension Mother   . Transient ischemic attack Mother   . CVA Mother   . Colon cancer Father   . Hypertension Father   . Diabetes Brother     Social History   Social History  . Marital status: Married    Spouse name: N/A  . Number of children: N/A  . Years of education: N/A   Occupational History  . Not on file.   Social History Main Topics  . Smoking status: Former Smoker    Packs/day: 1.50    Types: Cigarettes    Quit date: 04/09/2005  . Smokeless tobacco: Never Used  . Alcohol use No  . Drug use: No  . Sexual activity: Not on file   Other Topics Concern  . Not on file   Social History Narrative  . No narrative on file     Current Outpatient Prescriptions:  .  acetaminophen (TYLENOL) 500 MG tablet, Take 500 mg by mouth every 6 (six) hours as needed., Disp: , Rfl:  .  aspirin EC 325 MG tablet, Take 1 tablet (325 mg total) by mouth daily., Disp: 30 tablet, Rfl: 0 .  etanercept (ENBREL) 50 MG/ML injection, Inject 50 mg into the skin once a week. Saturday, Disp: , Rfl:  .  lovastatin (MEVACOR) 20 MG tablet, Take 1 tablet (20 mg total) by mouth at bedtime., Disp: 90 tablet, Rfl: 1 .  meloxicam (MOBIC) 15 MG tablet, Take 1 tablet (15 mg total) by mouth daily., Disp: 90 tablet, Rfl: 0 .  Multiple Vitamins-Minerals (ONE-A-DAY MENS 50+ ADVANTAGE PO), Take 1 tablet by  mouth daily., Disp: , Rfl:  .  Omega-3 Fatty Acids (FISH OIL) 1200 MG CAPS, Take 1 capsule by mouth 3 (three) times daily., Disp: , Rfl:  .  vitamin B-12 (CYANOCOBALAMIN) 1000 MCG tablet, Take 5,000 mcg by mouth daily., Disp: , Rfl:   No Known Allergies   Review of Systems  Constitutional: Negative for chills, fever and malaise/fatigue.  Eyes: Negative for blurred vision and double vision.  Respiratory: Negative for cough and shortness of breath.   Cardiovascular: Negative for chest pain and palpitations.  Gastrointestinal: Negative for blood in stool, constipation, diarrhea, heartburn, nausea and vomiting.  Genitourinary: Negative for dysuria and hematuria.  Musculoskeletal: Negative for back pain and neck pain.  Neurological: Negative for dizziness and headaches.  Psychiatric/Behavioral: Negative for depression. The patient is not nervous/anxious.      Objective  Vitals:   08/07/16 0857  BP: 120/73  Pulse: 85  Resp: 15  Temp: 98.2 F (36.8 C)  TempSrc: Oral  SpO2: 96%  Weight: 195 lb 4.8 oz (88.6 kg)  Height: 5\' 11"  (1.803 m)    Physical Exam  Constitutional: He is oriented to person, place, and time and well-developed,  well-nourished, and in no distress.  HENT:  Head: Normocephalic and atraumatic.  Right Ear: Tympanic membrane and ear canal normal.  Left Ear: Tympanic membrane and ear canal normal.  Mouth/Throat: Uvula is midline and oropharynx is clear and moist. No posterior oropharyngeal erythema.  Eyes: Conjunctivae are normal. Pupils are equal, round, and reactive to light.  Cardiovascular: Normal rate, regular rhythm, S1 normal and S2 normal.   No murmur heard. Pulmonary/Chest: Effort normal and breath sounds normal.  Abdominal: Soft. Bowel sounds are normal. There is no tenderness.  Genitourinary: Rectum normal and prostate normal.  Musculoskeletal:       Right ankle: He exhibits swelling.       Left ankle: He exhibits swelling.  Trace pitting edema  bilaterally.  Neurological: He is alert and oriented to person, place, and time.  Psychiatric: Mood, memory, affect and judgment normal.  Nursing note and vitals reviewed.     Assessment & Plan   1. Annual physical exam Patient up-to-date with colon cancer screening.  - PSA - Hepatitis C Antibody  2. Need for immunization against influenza  - Flu vaccine HIGH DOSE PF (Fluzone High dose)  Isidro Monks [facility] 08/07/2016 9:20 AM

## 2016-08-08 LAB — PSA: Prostate Specific Ag, Serum: 4.8 ng/mL — ABNORMAL HIGH (ref 0.0–4.0)

## 2016-08-08 LAB — HCV COMMENT:

## 2016-08-08 LAB — HEPATITIS C ANTIBODY (REFLEX): HCV Ab: <0.1 {s_co_ratio} (ref 0.0–0.9)

## 2016-08-22 ENCOUNTER — Other Ambulatory Visit: Payer: Self-pay | Admitting: Family Medicine

## 2016-08-22 DIAGNOSIS — M1712 Unilateral primary osteoarthritis, left knee: Secondary | ICD-10-CM

## 2016-09-25 ENCOUNTER — Other Ambulatory Visit: Payer: Self-pay | Admitting: Emergency Medicine

## 2016-09-25 DIAGNOSIS — R972 Elevated prostate specific antigen [PSA]: Secondary | ICD-10-CM

## 2016-11-04 ENCOUNTER — Ambulatory Visit: Payer: 59 | Admitting: Urology

## 2016-11-04 ENCOUNTER — Encounter: Payer: Self-pay | Admitting: Urology

## 2016-11-04 VITALS — BP 169/75 | HR 80 | Ht 70.0 in | Wt 197.0 lb

## 2016-11-04 DIAGNOSIS — R972 Elevated prostate specific antigen [PSA]: Secondary | ICD-10-CM

## 2016-11-04 NOTE — Progress Notes (Signed)
11/04/2016 8:55 AM   Seth Byrd 04/30/1946 [id number]  Referring provider: Ashok Norris, MD No address on file  Chief Complaint  Patient presents with  . Elevated PSA    New Patient    HPI: The patient is a 70 year old gentleman who presents today for an elevated PSA. His PSA was 4.8 in October 2017.  He does not believe it is elevated before. He denies any urinary symptoms at this time. He has no history of hematuria or nephrolithiasis. There is no history of UTIs. He denies nocturia, urgency, straining to urinate, and feeling of incomplete bladder emptying.  He does have a family history of prostate cancer in his father.   PMH: Past Medical History:  Diagnosis Date  . Arthritis   . Hypertension     Surgical History: Past Surgical History:  Procedure Laterality Date  . BACK SURGERY     Spinal Fusion with 2 screws  . JOINT REPLACEMENT    . TOTAL KNEE ARTHROPLASTY Left 04/23/2015   Procedure: TOTAL KNEE ARTHROPLASTY;  Surgeon: Christophe Louis, MD;  Location: [facility] ORS;  Service: Orthopedics;  Laterality: Left;    Home Medications:  Allergies as of 11/04/2016   No Known Allergies     Medication List       Accurate as of 11/04/16  8:55 AM. Always use your most recent med list.          acetaminophen 500 MG tablet Commonly known as:  TYLENOL Take 500 mg by mouth every 6 (six) hours as needed.   aspirin EC 81 MG tablet Take 81 mg by mouth daily.   etanercept 50 MG/ML injection Commonly known as:  ENBREL Inject 50 mg into the skin once a week. Saturday   Fish Oil 1200 MG Caps Take 1 capsule by mouth 3 (three) times daily.   lovastatin 20 MG tablet Commonly known as:  MEVACOR Take 1 tablet (20 mg total) by mouth at bedtime.   meloxicam 15 MG tablet Commonly known as:  MOBIC TAKE ONE TABLET BY MOUTH ONCE DAILY   ONE-A-DAY MENS 50+ ADVANTAGE PO Take 1 tablet by mouth daily.   vitamin B-12 1000 MCG tablet Commonly known as:   CYANOCOBALAMIN Take 5,000 mcg by mouth daily.       Allergies: No Known Allergies  Family History: Family History  Problem Relation Age of Onset  . Hypertension Mother   . Transient ischemic attack Mother   . CVA Mother   . Colon cancer Father   . Hypertension Father   . Prostate cancer Father   . Diabetes Brother     Social History:  reports that he quit smoking about 11 years ago. His smoking use included Cigarettes. He smoked 1.50 packs per day. He has never used smokeless tobacco. He reports that he does not drink alcohol or use drugs.  ROS: UROLOGY Frequent Urination?: Yes Hard to postpone urination?: No Burning/pain with urination?: No Get up at night to urinate?: No Leakage of urine?: No Urine stream starts and stops?: No Trouble starting stream?: No Do you have to strain to urinate?: No Blood in urine?: No Urinary tract infection?: No Sexually transmitted disease?: No Injury to kidneys or bladder?: No Painful intercourse?: No Weak stream?: No Erection problems?: No Penile pain?: No  Gastrointestinal Nausea?: No Vomiting?: No Indigestion/heartburn?: No Diarrhea?: No Constipation?: No  Constitutional Fever: No Night sweats?: No Weight loss?: No Fatigue?: No  Skin Skin rash/lesions?: Yes Itching?: No  Eyes Blurred vision?: No Double vision?: No  Ears/Nose/Throat Sore throat?: No Sinus problems?: No  Hematologic/Lymphatic Swollen glands?: No Easy bruising?: No  Cardiovascular Leg swelling?: No Chest pain?: No  Respiratory Cough?: No Shortness of breath?: No  Endocrine Excessive thirst?: No  Musculoskeletal Back pain?: No Joint pain?: Yes  Neurological Headaches?: No Dizziness?: No  Psychologic Depression?: No Anxiety?: No  Physical Exam: BP (!) 169/75   Pulse 80   Ht 5\' 10"  (1.778 m)   Wt 197 lb (89.4 kg)   BMI 28.27 kg/m   Constitutional:  Alert and oriented, No acute distress. HEENT: [place] AT, moist mucus  membranes.  Trachea midline, no masses. Cardiovascular: No clubbing, cyanosis, or edema. Respiratory: Normal respiratory effort, no increased work of breathing. GI: Abdomen is soft, nontender, nondistended, no abdominal masses GU: No CVA tenderness. Normal phallus. Testicles ascended bilaterally no masses. DRE: 2+ smooth benign. Skin: No rashes, bruises or suspicious lesions. Lymph: No cervical or inguinal adenopathy. Neurologic: Grossly intact, no focal deficits, moving all 4 extremities. Psychiatric: Normal mood and affect.  Laboratory Data: Lab Results  Component Value Date   WBC 5.7 07/31/2015   HGB 12.1 (L) 04/24/2015   HCT 40.4 07/31/2015   MCV 92 07/31/2015   PLT 195 07/31/2015    Lab Results  Component Value Date   CREATININE 0.88 11/25/2015    No results found for: PSA  No results found for: TESTOSTERONE  No results found for: HGBA1C  Urinalysis    Component Value Date/Time   COLORURINE YELLOW (A) 04/10/2015 0954   APPEARANCEUR CLEAR (A) 04/10/2015 0954   LABSPEC 1.016 04/10/2015 0954   PHURINE 6.0 04/10/2015 [place] 04/10/2015 0954   HGBUR NEGATIVE 04/10/2015 [place] 04/10/2015 0954   [place] 04/10/2015 0954   PROTEINUR NEGATIVE 04/10/2015 0954   NITRITE NEGATIVE 04/10/2015 0954   LEUKOCYTESUR NEGATIVE 04/10/2015 0954     Assessment & Plan:   1. Elevated PSA I had a long discussion with the patient regarding the role of PSA testing and prostate cancer screening. We also discussed the natural history of prostate cancer. We discussed that the next step for some of elevated PSAs to confirm that this is a true elevation with a repeat PSA test. We also discussed with that we typically schedule prostate biopsy pending the results of this repeat PSA. He understands the risks, benefits, indications for prostate biopsy. He understands the risks include bleeding and infection. He understands that he will see blood in  his urine, stool, and semen. The blood in his semen may persist for up to 6 weeks. He also understands the risk of infection of about 1% would require inpatient hospitalization for IV antibiotics. All questions were answered. The patient has elected to proceed with prostate biopsy pending the results of his repeat PSA.   Return for prostate biopsy.  Nickie Retort, MD  [facility] [address], [place], [place] [address] [phone]

## 2016-11-05 LAB — PSA: Prostate Specific Ag, Serum: 4.1 ng/mL — ABNORMAL HIGH (ref 0.0–4.0)

## 2016-11-12 ENCOUNTER — Telehealth: Payer: Self-pay

## 2016-11-12 NOTE — Telephone Encounter (Signed)
Spoke with pt in reference to PSA and prostate bx. Pt voiced understanding.

## 2016-11-12 NOTE — Telephone Encounter (Signed)
Nickie Retort, MD  Lestine Box, LPN        Please let patient know PSA remains elevated so we will proceed with prostate biopsy as scheduled    [facility]

## 2016-11-19 ENCOUNTER — Other Ambulatory Visit: Payer: Self-pay | Admitting: Family Medicine

## 2016-11-19 DIAGNOSIS — M1712 Unilateral primary osteoarthritis, left knee: Secondary | ICD-10-CM

## 2016-12-07 ENCOUNTER — Encounter: Payer: Self-pay | Admitting: Urology

## 2016-12-07 ENCOUNTER — Other Ambulatory Visit: Payer: Self-pay | Admitting: Urology

## 2016-12-07 ENCOUNTER — Ambulatory Visit: Payer: 59 | Admitting: Urology

## 2016-12-07 VITALS — BP 143/89 | HR 83 | Ht 70.0 in | Wt 196.1 lb

## 2016-12-07 DIAGNOSIS — R972 Elevated prostate specific antigen [PSA]: Secondary | ICD-10-CM | POA: Diagnosis not present

## 2016-12-07 MED ORDER — GENTAMICIN SULFATE 40 MG/ML IJ SOLN
80.0000 mg | Freq: Once | INTRAMUSCULAR | Status: AC
  Administered 2016-12-07: 80 mg via INTRAMUSCULAR

## 2016-12-07 MED ORDER — LEVOFLOXACIN 500 MG PO TABS
500.0000 mg | ORAL_TABLET | Freq: Once | ORAL | Status: AC
  Administered 2016-12-07: 500 mg via ORAL

## 2016-12-07 NOTE — Progress Notes (Signed)
71 year old gentleman who presents today for an elevated PSA. His PSA was 4.8 in October 2017. Repeat PSA 4.09 Oct 2016.   He does not believe it is elevated before. He denies any urinary symptoms. He has no history of hematuria or nephrolithiasis. There is no history of UTIs. He denies nocturia, urgency, straining to urinate, and feeling of incomplete bladder emptying.  He does have a family history of prostate cancer in his father.  Prostate Biopsy Procedure   Informed consent was obtained after discussing risks/benefits of the procedure.  A time out was performed to ensure correct patient identity.  Pre-Procedure: - Gentamicin given prophylactically - Levaquin 500 mg administered PO -Transrectal Ultrasound performed revealing a 36.45 gm prostate -No significant hypoechoic or median lobe noted, no median lobe -DRE: normal   Procedure: - Prostate block performed using 10 cc 1% lidocaine and biopsies taken from sextant areas, a total of 12 under ultrasound guidance.  Post-Procedure: - Patient tolerated the procedure well - He was counseled to seek immediate medical attention if experiences any severe pain, significant bleeding, or fevers - Return in one week to discuss biopsy results -- discussed with patient management of PCa might include surveillance vs treatment depending on stage, grade, pt.

## 2016-12-12 LAB — PATHOLOGY REPORT

## 2016-12-14 ENCOUNTER — Other Ambulatory Visit: Payer: Self-pay | Admitting: Urology

## 2016-12-16 ENCOUNTER — Ambulatory Visit: Payer: 59 | Admitting: Urology

## 2016-12-22 ENCOUNTER — Encounter: Payer: Self-pay | Admitting: Urology

## 2016-12-22 ENCOUNTER — Ambulatory Visit (INDEPENDENT_AMBULATORY_CARE_PROVIDER_SITE_OTHER): Payer: 59 | Admitting: Urology

## 2016-12-22 VITALS — BP 156/83 | HR 77 | Ht 70.0 in | Wt 196.1 lb

## 2016-12-22 DIAGNOSIS — C61 Malignant neoplasm of prostate: Secondary | ICD-10-CM

## 2016-12-22 NOTE — Progress Notes (Signed)
12/22/2016 3:00 PM   Seth Byrd 06-20-1946 [id number]  Referring provider: Ashok Norris, MD No address on file  Chief Complaint  Patient presents with  . Follow-up    Prostate biopsy result     HPI:  Patient returns after prostate biopsy to discuss new diagnosis of low risk prostate cancer. January 2018 low risk prostate cancer PSA 4.8, T1c, 36 g prostate Gleason 3+3 = 6 in 1 of 12 cores, right apex, 3%  Patient is well today.    PMH: Past Medical History:  Diagnosis Date  . Arthritis   . Hypertension     Surgical History: Past Surgical History:  Procedure Laterality Date  . BACK SURGERY     Spinal Fusion with 2 screws  . JOINT REPLACEMENT    . TOTAL KNEE ARTHROPLASTY Left 04/23/2015   Procedure: TOTAL KNEE ARTHROPLASTY;  Surgeon: Christophe Louis, MD;  Location: [facility] ORS;  Service: Orthopedics;  Laterality: Left;    Home Medications:  Allergies as of 12/22/2016   No Known Allergies     Medication List       Accurate as of 12/22/16  3:00 PM. Always use your most recent med list.          acetaminophen 500 MG tablet Commonly known as:  TYLENOL Take 500 mg by mouth every 6 (six) hours as needed.   aspirin EC 81 MG tablet Take 81 mg by mouth daily.   etanercept 50 MG/ML injection Commonly known as:  ENBREL Inject 50 mg into the skin once a week. Saturday   Fish Oil 1200 MG Caps Take 1 capsule by mouth 3 (three) times daily.   lovastatin 20 MG tablet Commonly known as:  MEVACOR Take 1 tablet (20 mg total) by mouth at bedtime.   meloxicam 15 MG tablet Commonly known as:  MOBIC TAKE ONE TABLET BY MOUTH ONCE DAILY   ONE-A-DAY MENS 50+ ADVANTAGE PO Take 1 tablet by mouth daily.   vitamin B-12 1000 MCG tablet Commonly known as:  CYANOCOBALAMIN Take 5,000 mcg by mouth daily.       Allergies: No Known Allergies  Family History: Family History  Problem Relation Age of Onset  . Hypertension Mother   . Transient ischemic attack  Mother   . CVA Mother   . Colon cancer Father   . Hypertension Father   . Prostate cancer Father   . Diabetes Brother     Social History:  reports that he quit smoking about 11 years ago. His smoking use included Cigarettes. He smoked 1.50 packs per day. He has never used smokeless tobacco. He reports that he does not drink alcohol or use drugs.  ROS:                                        Physical Exam: BP (!) 156/83   Pulse 77   Ht 5\' 10"  (1.778 m)   Wt 89 kg (196 lb 1.6 oz)   BMI 28.14 kg/m   Constitutional:  Alert and oriented, No acute distress. HEENT: [place] AT, moist mucus membranes.  Trachea midline, no masses. Cardiovascular: No clubbing, cyanosis, or edema. Respiratory: Normal respiratory effort, no increased work of breathing. GI: Abdomen is soft, nontender, nondistended, no abdominal masses GU: No CVA tenderness. Skin: No rashes, bruises or suspicious lesions. Neurologic: Grossly intact, no focal deficits, moving all 4 extremities. Psychiatric: Normal mood and affect.  Laboratory  Data: Lab Results  Component Value Date   WBC 5.7 07/31/2015   HGB 12.1 (L) 04/24/2015   HCT 40.4 07/31/2015   MCV 92 07/31/2015   PLT 195 07/31/2015    Lab Results  Component Value Date   CREATININE 0.88 11/25/2015    No results found for: PSA  No results found for: TESTOSTERONE  No results found for: HGBA1C  Urinalysis    Component Value Date/Time   COLORURINE YELLOW (A) 04/10/2015 0954   APPEARANCEUR CLEAR (A) 04/10/2015 0954   LABSPEC 1.016 04/10/2015 0954   PHURINE 6.0 04/10/2015 [place] 04/10/2015 0954   HGBUR NEGATIVE 04/10/2015 [place] 04/10/2015 [place] 04/10/2015 0954   PROTEINUR NEGATIVE 04/10/2015 0954   NITRITE NEGATIVE 04/10/2015 0954   LEUKOCYTESUR NEGATIVE 04/10/2015 0954     Assessment & Plan:    Prostate cancer - I had a long discussion with the patient using the  Living with Prostate Cancer booklet and his path report as a guide. We went over his stage, grade and prognosis and the relevant anatomy. We discussed the nature risks and benefits of active surveillance, radical prostatectomy, IMRT, brachytherapy. We discussed specifically how each treatment might affect the bowel, bladder and sexual function. We discussed how each treatment might effect salvage treatments. We discussed the role of other modalities in the treatment of prostate cancer including HIFU and cryotherapy. All questions answered. He elects to start AS. He hoped to avoid another biopsy.   There are no diagnoses linked to this encounter.  No Follow-up on file.  Festus Aloe, [facility] [address], [place], [place] [address] [phone]

## 2016-12-26 ENCOUNTER — Other Ambulatory Visit: Payer: Self-pay | Admitting: Family Medicine

## 2016-12-26 DIAGNOSIS — E785 Hyperlipidemia, unspecified: Secondary | ICD-10-CM

## 2016-12-28 ENCOUNTER — Other Ambulatory Visit: Payer: Self-pay | Admitting: Family Medicine

## 2016-12-28 DIAGNOSIS — E785 Hyperlipidemia, unspecified: Secondary | ICD-10-CM

## 2016-12-29 ENCOUNTER — Telehealth: Payer: Self-pay | Admitting: Family Medicine

## 2016-12-29 DIAGNOSIS — E7800 Pure hypercholesterolemia, unspecified: Secondary | ICD-10-CM

## 2016-12-29 MED ORDER — LOVASTATIN 20 MG PO TABS: 20.0000 mg | ORAL_TABLET | Freq: Every day | ORAL | 1 refills | Status: DC

## 2016-12-29 NOTE — Telephone Encounter (Signed)
PT NOTIFIED  

## 2016-12-29 NOTE — Telephone Encounter (Signed)
Pt is needing his cholesterol meds refilled. Pharm has sent in request with no response.

## 2016-12-29 NOTE — Telephone Encounter (Signed)
Prescription for lovastatin has been sent to patient's pharmacy

## 2017-02-05 ENCOUNTER — Encounter: Payer: Self-pay | Admitting: Family Medicine

## 2017-02-05 ENCOUNTER — Ambulatory Visit (INDEPENDENT_AMBULATORY_CARE_PROVIDER_SITE_OTHER): Payer: 59 | Admitting: Family Medicine

## 2017-02-05 VITALS — BP 136/70 | HR 84 | Temp 98.3°F | Resp 18 | Ht 70.0 in | Wt 188.0 lb

## 2017-02-05 DIAGNOSIS — Z96652 Presence of left artificial knee joint: Secondary | ICD-10-CM | POA: Diagnosis not present

## 2017-02-05 DIAGNOSIS — E7800 Pure hypercholesterolemia, unspecified: Secondary | ICD-10-CM | POA: Diagnosis not present

## 2017-02-05 MED ORDER — LOVASTATIN 20 MG PO TABS: 20.0000 mg | ORAL_TABLET | Freq: Every day | ORAL | 1 refills | Status: DC

## 2017-02-05 MED ORDER — MELOXICAM 15 MG PO TABS: 15.0000 mg | ORAL_TABLET | Freq: Every day | ORAL | 1 refills | Status: DC

## 2017-02-05 NOTE — Progress Notes (Signed)
Name: Seth Byrd   MRN: [id number]    DOB: 1946-01-23   Date:02/05/2017       Progress Note  Subjective  Chief Complaint  Chief Complaint  Patient presents with  . Hyperlipidemia    6 month follow up    Hyperlipidemia  This is a chronic problem. The problem is controlled. Recent lipid tests were reviewed and are normal. Pertinent negatives include no leg pain, myalgias or shortness of breath. Current antihyperlipidemic treatment includes statins. Risk factors for coronary artery disease include dyslipidemia.   Pt. Presents for follow up of chronic left knee pain, s/p left total knee replacement in 2016, takes Meloxicam 15 mg daily for associated pain and inflammation. No bleeding episodes.  Past Medical History:  Diagnosis Date  . Arthritis   . Hypertension     Past Surgical History:  Procedure Laterality Date  . BACK SURGERY     Spinal Fusion with 2 screws  . JOINT REPLACEMENT    . TOTAL KNEE ARTHROPLASTY Left 04/23/2015   Procedure: TOTAL KNEE ARTHROPLASTY;  Surgeon: Christophe Louis, MD;  Location: [facility] ORS;  Service: Orthopedics;  Laterality: Left;    Family History  Problem Relation Age of Onset  . Hypertension Mother   . Transient ischemic attack Mother   . CVA Mother   . Colon cancer Father   . Hypertension Father   . Prostate cancer Father   . Diabetes Brother     Social History   Social History  . Marital status: Married    Spouse name: N/A  . Number of children: N/A  . Years of education: N/A   Occupational History  . Not on file.   Social History Main Topics  . Smoking status: Former Smoker    Packs/day: 1.50    Types: Cigarettes    Quit date: 04/09/2005  . Smokeless tobacco: Never Used  . Alcohol use No  . Drug use: No  . Sexual activity: Not on file   Other Topics Concern  . Not on file   Social History Narrative  . No narrative on file     Current Outpatient Prescriptions:  .  acetaminophen (TYLENOL) 500 MG tablet, Take 500 mg by  mouth every 6 (six) hours as needed., Disp: , Rfl:  .  aspirin EC 81 MG tablet, Take 81 mg by mouth daily., Disp: , Rfl:  .  etanercept (ENBREL) 50 MG/ML injection, Inject 50 mg into the skin once a week. Saturday, Disp: , Rfl:  .  lovastatin (MEVACOR) 20 MG tablet, Take 1 tablet (20 mg total) by mouth at bedtime., Disp: 90 tablet, Rfl: 1 .  meloxicam (MOBIC) 15 MG tablet, TAKE ONE TABLET BY MOUTH ONCE DAILY, Disp: 90 tablet, Rfl: 0 .  Multiple Vitamins-Minerals (ONE-A-DAY MENS 50+ ADVANTAGE PO), Take 1 tablet by mouth daily., Disp: , Rfl:  .  Omega-3 Fatty Acids (FISH OIL) 1200 MG CAPS, Take 1 capsule by mouth 3 (three) times daily., Disp: , Rfl:  .  vitamin B-12 (CYANOCOBALAMIN) 1000 MCG tablet, Take 5,000 mcg by mouth daily., Disp: , Rfl:   No Known Allergies   Review of Systems  Constitutional: Negative for chills and fever.  Respiratory: Negative for shortness of breath.   Gastrointestinal: Negative for abdominal pain, nausea and vomiting.  Musculoskeletal: Positive for joint pain. Negative for myalgias.     Objective  Vitals:   02/05/17 0811  BP: 136/70  Pulse: 84  Resp: 18  Temp: 98.3 F (36.8 C)  TempSrc: Oral  SpO2: 99%  Weight: 188 lb (85.3 kg)  Height: 5\' 10"  (1.778 m)    Physical Exam  Constitutional: He is oriented to person, place, and time and well-developed, well-nourished, and in no distress.  Cardiovascular: Normal rate, regular rhythm and normal heart sounds.   No murmur heard. Pulmonary/Chest: Effort normal and breath sounds normal. He has no wheezes.  Abdominal: Soft. Bowel sounds are normal. There is no tenderness.  Musculoskeletal: He exhibits no edema.       Left knee: He exhibits normal range of motion, no swelling and no effusion. No tenderness found.       Legs: Neurological: He is alert and oriented to person, place, and time.  Psychiatric: Mood, memory, affect and judgment normal.  Nursing note and vitals reviewed.      Assessment &  Plan  1. Pure hypercholesterolemia Continue on statin therapy, obtain FLP - lovastatin (MEVACOR) 20 MG tablet; Take 1 tablet (20 mg total) by mouth at bedtime.  Dispense: 90 tablet; Refill: 1 - Lipid panel - COMPLETE METABOLIC PANEL WITH GFR  2. Status post total left knee replacement Continues to take NSAID, follow-up with orthopedics. - meloxicam (MOBIC) 15 MG tablet; Take 1 tablet (15 mg total) by mouth daily.  Dispense: 90 tablet; Refill: 1    Siddhant Hashemi [facility] 02/05/2017 8:30 AM

## 2017-02-09 ENCOUNTER — Emergency Department: Payer: 59

## 2017-02-09 ENCOUNTER — Inpatient Hospital Stay (HOSPITAL_COMMUNITY): Payer: 59

## 2017-02-09 ENCOUNTER — Encounter: Payer: Self-pay | Admitting: *Deleted

## 2017-02-09 ENCOUNTER — Observation Stay
Admission: EM | Admit: 2017-02-09 | Discharge: 2017-02-09 | Disposition: A | Discharge status: Home / Self Care | Payer: 59 | Source: Home / Self Care | Attending: Emergency Medicine | Admitting: Emergency Medicine

## 2017-02-09 ENCOUNTER — Inpatient Hospital Stay (HOSPITAL_COMMUNITY)
Admission: AD | Admit: 2017-02-09 | Discharge: 2017-02-19 | DRG: 233 | Disposition: A | Discharge status: Skilled Nursing Facility | Payer: 59 | Source: Other Acute Inpatient Hospital | Attending: Cardiothoracic Surgery | Admitting: Cardiothoracic Surgery

## 2017-02-09 ENCOUNTER — Observation Stay
Admit: 2017-02-09 | Discharge: 2017-02-09 | Disposition: A | Discharge status: Home / Self Care | Payer: 59 | Attending: Family Medicine | Admitting: Family Medicine

## 2017-02-09 ENCOUNTER — Encounter
Admission: EM | Disposition: A | Discharge status: Home / Self Care | Payer: Self-pay | Source: Home / Self Care | Attending: Emergency Medicine

## 2017-02-09 ENCOUNTER — Other Ambulatory Visit: Payer: Self-pay | Admitting: *Deleted

## 2017-02-09 DIAGNOSIS — E876 Hypokalemia: Secondary | ICD-10-CM

## 2017-02-09 DIAGNOSIS — E785 Hyperlipidemia, unspecified: Secondary | ICD-10-CM

## 2017-02-09 DIAGNOSIS — Z01810 Encounter for preprocedural cardiovascular examination: Secondary | ICD-10-CM

## 2017-02-09 DIAGNOSIS — Z951 Presence of aortocoronary bypass graft: Secondary | ICD-10-CM | POA: Diagnosis not present

## 2017-02-09 DIAGNOSIS — Z7982 Long term (current) use of aspirin: Secondary | ICD-10-CM | POA: Insufficient documentation

## 2017-02-09 DIAGNOSIS — I2510 Atherosclerotic heart disease of native coronary artery without angina pectoris: Secondary | ICD-10-CM

## 2017-02-09 DIAGNOSIS — M069 Rheumatoid arthritis, unspecified: Secondary | ICD-10-CM

## 2017-02-09 DIAGNOSIS — I080 Rheumatic disorders of both mitral and aortic valves: Secondary | ICD-10-CM | POA: Diagnosis present

## 2017-02-09 DIAGNOSIS — I340 Nonrheumatic mitral (valve) insufficiency: Secondary | ICD-10-CM | POA: Insufficient documentation

## 2017-02-09 DIAGNOSIS — I110 Hypertensive heart disease with heart failure: Secondary | ICD-10-CM | POA: Diagnosis present

## 2017-02-09 DIAGNOSIS — J449 Chronic obstructive pulmonary disease, unspecified: Secondary | ICD-10-CM | POA: Diagnosis present

## 2017-02-09 DIAGNOSIS — Z87891 Personal history of nicotine dependence: Secondary | ICD-10-CM | POA: Insufficient documentation

## 2017-02-09 DIAGNOSIS — Z823 Family history of stroke: Secondary | ICD-10-CM | POA: Insufficient documentation

## 2017-02-09 DIAGNOSIS — R079 Chest pain, unspecified: Secondary | ICD-10-CM | POA: Diagnosis present

## 2017-02-09 DIAGNOSIS — I10 Essential (primary) hypertension: Secondary | ICD-10-CM

## 2017-02-09 DIAGNOSIS — I4510 Unspecified right bundle-branch block: Secondary | ICD-10-CM

## 2017-02-09 DIAGNOSIS — Z79899 Other long term (current) drug therapy: Secondary | ICD-10-CM

## 2017-02-09 DIAGNOSIS — Z96652 Presence of left artificial knee joint: Secondary | ICD-10-CM | POA: Diagnosis present

## 2017-02-09 DIAGNOSIS — G8918 Other acute postprocedural pain: Secondary | ICD-10-CM

## 2017-02-09 DIAGNOSIS — C61 Malignant neoplasm of prostate: Secondary | ICD-10-CM | POA: Diagnosis present

## 2017-02-09 DIAGNOSIS — Z8042 Family history of malignant neoplasm of prostate: Secondary | ICD-10-CM

## 2017-02-09 DIAGNOSIS — Z9689 Presence of other specified functional implants: Secondary | ICD-10-CM

## 2017-02-09 DIAGNOSIS — I214 Non-ST elevation (NSTEMI) myocardial infarction: Secondary | ICD-10-CM | POA: Diagnosis not present

## 2017-02-09 DIAGNOSIS — D62 Acute posthemorrhagic anemia: Secondary | ICD-10-CM | POA: Diagnosis not present

## 2017-02-09 DIAGNOSIS — Z8546 Personal history of malignant neoplasm of prostate: Secondary | ICD-10-CM | POA: Insufficient documentation

## 2017-02-09 DIAGNOSIS — D696 Thrombocytopenia, unspecified: Secondary | ICD-10-CM | POA: Diagnosis not present

## 2017-02-09 DIAGNOSIS — Z981 Arthrodesis status: Secondary | ICD-10-CM | POA: Diagnosis not present

## 2017-02-09 DIAGNOSIS — Z8249 Family history of ischemic heart disease and other diseases of the circulatory system: Secondary | ICD-10-CM

## 2017-02-09 DIAGNOSIS — Z09 Encounter for follow-up examination after completed treatment for conditions other than malignant neoplasm: Secondary | ICD-10-CM

## 2017-02-09 DIAGNOSIS — M1990 Unspecified osteoarthritis, unspecified site: Secondary | ICD-10-CM | POA: Diagnosis present

## 2017-02-09 DIAGNOSIS — I5021 Acute systolic (congestive) heart failure: Secondary | ICD-10-CM | POA: Diagnosis present

## 2017-02-09 DIAGNOSIS — I252 Old myocardial infarction: Secondary | ICD-10-CM | POA: Diagnosis present

## 2017-02-09 DIAGNOSIS — I255 Ischemic cardiomyopathy: Secondary | ICD-10-CM

## 2017-02-09 DIAGNOSIS — I5022 Chronic systolic (congestive) heart failure: Secondary | ICD-10-CM | POA: Diagnosis not present

## 2017-02-09 DIAGNOSIS — J90 Pleural effusion, not elsewhere classified: Secondary | ICD-10-CM | POA: Diagnosis not present

## 2017-02-09 DIAGNOSIS — I5043 Acute on chronic combined systolic (congestive) and diastolic (congestive) heart failure: Secondary | ICD-10-CM | POA: Diagnosis not present

## 2017-02-09 DIAGNOSIS — I25110 Atherosclerotic heart disease of native coronary artery with unstable angina pectoris: Secondary | ICD-10-CM

## 2017-02-09 HISTORY — PX: LEFT HEART CATH AND CORONARY ANGIOGRAPHY: CATH118249

## 2017-02-09 LAB — COMPREHENSIVE METABOLIC PANEL
ALT: 27 U/L (ref 17–63)
ANION GAP: 7 (ref 5–15)
AST: 37 U/L (ref 15–41)
Albumin: 3.8 g/dL (ref 3.5–5.0)
Alkaline Phosphatase: 66 U/L (ref 38–126)
BILIRUBIN TOTAL: 1 mg/dL (ref 0.3–1.2)
BUN: 16 mg/dL (ref 6–20)
CO2: 23 mmol/L (ref 22–32)
Calcium: 9.4 mg/dL (ref 8.9–10.3)
Chloride: 108 mmol/L (ref 101–111)
Creatinine, Ser: 1.11 mg/dL (ref 0.61–1.24)
GFR calc Af Amer: >60 mL/min (ref >60)
Glucose, Bld: 140 mg/dL — ABNORMAL HIGH (ref 65–99)
POTASSIUM: 3.4 mmol/L — AB (ref 3.5–5.1)
Sodium: 138 mmol/L (ref 135–145)
TOTAL PROTEIN: 7.4 g/dL (ref 6.5–8.1)

## 2017-02-09 LAB — TROPONIN I
Troponin I: 0.05 ng/mL (ref <0.03)
Troponin I: 0.54 ng/mL (ref <0.03)
Troponin I: 1.5 ng/mL (ref <0.03)

## 2017-02-09 LAB — ECHOCARDIOGRAM COMPLETE
HEIGHTINCHES: 71 in
WEIGHTICAEL: 2992 [oz_av]

## 2017-02-09 LAB — CBC WITH DIFFERENTIAL/PLATELET
BASOS ABS: 0 10*3/uL (ref 0–0.1)
BASOS PCT: 1 %
EOS PCT: 2 %
Eosinophils Absolute: 0.1 10*3/uL (ref 0–0.7)
HCT: 39.2 % — ABNORMAL LOW (ref 40.0–52.0)
Hemoglobin: 13.5 g/dL (ref 13.0–18.0)
LYMPHS PCT: 24 %
Lymphs Abs: 1.3 10*3/uL (ref 1.0–3.6)
MCH: 32.6 pg (ref 26.0–34.0)
MCHC: 34.4 g/dL (ref 32.0–36.0)
MCV: 94.9 fL (ref 80.0–100.0)
Monocytes Absolute: 0.4 10*3/uL (ref 0.2–1.0)
Monocytes Relative: 8 %
NEUTROS ABS: 3.5 10*3/uL (ref 1.4–6.5)
Neutrophils Relative %: 65 %
Platelets: 138 10*3/uL — ABNORMAL LOW (ref 150–440)
RBC: 4.13 MIL/uL — AB (ref 4.40–5.90)
RDW: 12.7 % (ref 11.5–14.5)
WBC: 5.4 10*3/uL (ref 3.8–10.6)

## 2017-02-09 LAB — SURGICAL PCR SCREEN
MRSA, PCR: NEGATIVE
STAPHYLOCOCCUS AUREUS: NEGATIVE

## 2017-02-09 LAB — ABO/RH: ABO/RH(D): B NEG

## 2017-02-09 LAB — PROTIME-INR
INR: 1.03
Prothrombin Time: 13.5 seconds (ref 11.4–15.2)

## 2017-02-09 LAB — APTT: APTT: 34 s (ref 24–36)

## 2017-02-09 SURGERY — LEFT HEART CATH AND CORONARY ANGIOGRAPHY
Anesthesia: Moderate Sedation

## 2017-02-09 MED ORDER — MORPHINE SULFATE (PF) 4 MG/ML IV SOLN: 2.0000 mg | INTRAVENOUS | Status: DC | PRN

## 2017-02-09 MED ORDER — NITROGLYCERIN 0.4 MG SL SUBL: 0.4000 mg | SUBLINGUAL_TABLET | SUBLINGUAL | 12 refills | Status: DC | PRN

## 2017-02-09 MED ORDER — CHLORHEXIDINE GLUCONATE CLOTH 2 % EX PADS
6.0000 | MEDICATED_PAD | Freq: Once | CUTANEOUS | Status: AC
  Administered 2017-02-09: 6 via TOPICAL

## 2017-02-09 MED ORDER — MIDAZOLAM HCL 2 MG/2ML IJ SOLN
INTRAMUSCULAR | Status: AC
  Filled 2017-02-09: qty 2

## 2017-02-09 MED ORDER — VANCOMYCIN HCL 10 G IV SOLR
1500.0000 mg | INTRAVENOUS | Status: AC
  Administered 2017-02-10: 1500 mg via INTRAVENOUS
  Filled 2017-02-09: qty 1500

## 2017-02-09 MED ORDER — SODIUM CHLORIDE 0.9% FLUSH: 3.0000 mL | INTRAVENOUS | Status: DC | PRN

## 2017-02-09 MED ORDER — ASPIRIN EC 325 MG PO TBEC
325.0000 mg | DELAYED_RELEASE_TABLET | Freq: Every day | ORAL | Status: DC
  Administered 2017-02-09: 325 mg via ORAL
  Filled 2017-02-09: qty 1

## 2017-02-09 MED ORDER — DOPAMINE-DEXTROSE 3.2-5 MG/ML-% IV SOLN
0.0000 ug/kg/min | INTRAVENOUS | Status: AC
  Administered 2017-02-10: 3 ug/kg/min via INTRAVENOUS
  Filled 2017-02-09: qty 250

## 2017-02-09 MED ORDER — ALPRAZOLAM 0.25 MG PO TABS: 0.2500 mg | ORAL_TABLET | Freq: Two times a day (BID) | ORAL | Status: DC | PRN

## 2017-02-09 MED ORDER — SODIUM CHLORIDE 0.9 % IV SOLN
1.5000 mg/kg/h | INTRAVENOUS | Status: AC
  Administered 2017-02-10: 1.5 mg/kg/h via INTRAVENOUS
  Filled 2017-02-09: qty 25

## 2017-02-09 MED ORDER — SODIUM CHLORIDE 0.9 % IV SOLN: INTRAVENOUS | Status: DC

## 2017-02-09 MED ORDER — NITROGLYCERIN 0.4 MG SL SUBL
0.4000 mg | SUBLINGUAL_TABLET | SUBLINGUAL | Status: DC | PRN
Start: 2017-02-09 — End: 2017-02-09
  Administered 2017-02-09 (×3): 0.4 mg via SUBLINGUAL
  Filled 2017-02-09: qty 1

## 2017-02-09 MED ORDER — CHLORHEXIDINE GLUCONATE CLOTH 2 % EX PADS
6.0000 | MEDICATED_PAD | Freq: Once | CUTANEOUS | Status: AC
  Administered 2017-02-10: 6 via TOPICAL

## 2017-02-09 MED ORDER — ASPIRIN 81 MG PO CHEW
162.0000 mg | CHEWABLE_TABLET | Freq: Once | ORAL | Status: AC
  Administered 2017-02-09: 162 mg via ORAL
  Filled 2017-02-09: qty 2

## 2017-02-09 MED ORDER — METOPROLOL TARTRATE 25 MG PO TABS
25.0000 mg | ORAL_TABLET | Freq: Two times a day (BID) | ORAL | Status: DC
  Administered 2017-02-09: 25 mg via ORAL
  Filled 2017-02-09: qty 1

## 2017-02-09 MED ORDER — HEPARIN (PORCINE) IN NACL 100-0.45 UNIT/ML-% IJ SOLN
1050.0000 [IU]/h | INTRAMUSCULAR | Status: DC
  Filled 2017-02-09: qty 250

## 2017-02-09 MED ORDER — DEXTROSE 5 % IV SOLN
750.0000 mg | INTRAVENOUS | Status: DC
  Filled 2017-02-09: qty 750

## 2017-02-09 MED ORDER — ASPIRIN EC 81 MG PO TBEC
81.0000 mg | DELAYED_RELEASE_TABLET | Freq: Every day | ORAL | Status: DC
Start: 2017-02-09 — End: 2017-02-09

## 2017-02-09 MED ORDER — ACETAMINOPHEN 325 MG PO TABS: 650.0000 mg | ORAL_TABLET | ORAL | Status: DC | PRN

## 2017-02-09 MED ORDER — GI COCKTAIL ~~LOC~~
30.0000 mL | Freq: Four times a day (QID) | ORAL | Status: DC | PRN
  Filled 2017-02-09: qty 30

## 2017-02-09 MED ORDER — ACETAMINOPHEN 500 MG PO TABS: 500.0000 mg | ORAL_TABLET | Freq: Four times a day (QID) | ORAL | Status: DC | PRN

## 2017-02-09 MED ORDER — ZOLPIDEM TARTRATE 5 MG PO TABS: 5.0000 mg | ORAL_TABLET | Freq: Every evening | ORAL | Status: DC | PRN

## 2017-02-09 MED ORDER — PLASMA-LYTE 148 IV SOLN
INTRAVENOUS | Status: AC
  Administered 2017-02-10: 500 mL
  Filled 2017-02-09: qty 2.5

## 2017-02-09 MED ORDER — FENTANYL CITRATE (PF) 100 MCG/2ML IJ SOLN
INTRAMUSCULAR | Status: AC
  Filled 2017-02-09: qty 2

## 2017-02-09 MED ORDER — METOPROLOL TARTRATE 12.5 MG HALF TABLET
12.5000 mg | ORAL_TABLET | Freq: Once | ORAL | Status: AC
  Administered 2017-02-10: 12.5 mg via ORAL
  Filled 2017-02-09: qty 1

## 2017-02-09 MED ORDER — SODIUM CHLORIDE 0.9 % IV SOLN
INTRAVENOUS | Status: DC
  Filled 2017-02-09: qty 30

## 2017-02-09 MED ORDER — ONDANSETRON HCL 4 MG/2ML IJ SOLN: 4.0000 mg | Freq: Four times a day (QID) | INTRAMUSCULAR | Status: DC | PRN

## 2017-02-09 MED ORDER — LIDOCAINE HCL (PF) 1 % IJ SOLN
INTRAMUSCULAR | Status: AC
  Filled 2017-02-09: qty 30

## 2017-02-09 MED ORDER — MELOXICAM 7.5 MG PO TABS: 15.0000 mg | ORAL_TABLET | Freq: Every day | ORAL | Status: DC

## 2017-02-09 MED ORDER — VITAMIN B-12 1000 MCG PO TABS
5000.0000 ug | ORAL_TABLET | Freq: Every day | ORAL | Status: DC
  Administered 2017-02-09: 5000 ug via ORAL
  Filled 2017-02-09: qty 5

## 2017-02-09 MED ORDER — POTASSIUM CHLORIDE CRYS ER 20 MEQ PO TBCR
40.0000 meq | EXTENDED_RELEASE_TABLET | Freq: Two times a day (BID) | ORAL | Status: DC
  Administered 2017-02-09: 40 meq via ORAL
  Filled 2017-02-09: qty 2

## 2017-02-09 MED ORDER — ETANERCEPT 50 MG/ML ~~LOC~~ SOSY: 50.0000 mg | PREFILLED_SYRINGE | SUBCUTANEOUS | Status: DC

## 2017-02-09 MED ORDER — SODIUM CHLORIDE 0.9% FLUSH: 3.0000 mL | Freq: Two times a day (BID) | INTRAVENOUS | Status: DC

## 2017-02-09 MED ORDER — DEXMEDETOMIDINE HCL IN NACL 400 MCG/100ML IV SOLN
0.1000 ug/kg/h | INTRAVENOUS | Status: AC
  Administered 2017-02-10: .4 ug/kg/h via INTRAVENOUS
  Filled 2017-02-09: qty 100

## 2017-02-09 MED ORDER — ASPIRIN 81 MG PO CHEW: 81.0000 mg | CHEWABLE_TABLET | ORAL | Status: DC

## 2017-02-09 MED ORDER — NITROGLYCERIN 0.4 MG SL SUBL: 0.4000 mg | SUBLINGUAL_TABLET | SUBLINGUAL | Status: DC | PRN

## 2017-02-09 MED ORDER — HEPARIN SODIUM (PORCINE) 5000 UNIT/ML IJ SOLN
5000.0000 [IU] | Freq: Three times a day (TID) | INTRAMUSCULAR | Status: DC
  Administered 2017-02-09: 5000 [IU] via SUBCUTANEOUS
  Filled 2017-02-09: qty 1

## 2017-02-09 MED ORDER — MIDAZOLAM HCL 2 MG/2ML IJ SOLN
INTRAMUSCULAR | Status: DC | PRN
  Administered 2017-02-09: 1 mg via INTRAVENOUS

## 2017-02-09 MED ORDER — SODIUM CHLORIDE 0.9 % IV SOLN: 250.0000 mL | INTRAVENOUS | Status: DC | PRN

## 2017-02-09 MED ORDER — SODIUM CHLORIDE 0.9 % WEIGHT BASED INFUSION: 3.0000 mL/kg/h | INTRAVENOUS | Status: DC

## 2017-02-09 MED ORDER — ATORVASTATIN CALCIUM 40 MG PO TABS
40.0000 mg | ORAL_TABLET | Freq: Every day | ORAL | Status: DC
  Administered 2017-02-09: 40 mg via ORAL
  Filled 2017-02-09: qty 1

## 2017-02-09 MED ORDER — TRANEXAMIC ACID (OHS) PUMP PRIME SOLUTION
2.0000 mg/kg | INTRAVENOUS | Status: DC
  Filled 2017-02-09: qty 1.7

## 2017-02-09 MED ORDER — MAGNESIUM SULFATE 50 % IJ SOLN
40.0000 meq | INTRAMUSCULAR | Status: DC
  Filled 2017-02-09: qty 10

## 2017-02-09 MED ORDER — ATORVASTATIN CALCIUM 20 MG PO TABS: 40.0000 mg | ORAL_TABLET | Freq: Every day | ORAL | Status: DC

## 2017-02-09 MED ORDER — LISINOPRIL 5 MG PO TABS: 5.0000 mg | ORAL_TABLET | Freq: Every day | ORAL | Status: DC

## 2017-02-09 MED ORDER — CHLORHEXIDINE GLUCONATE 0.12 % MT SOLN
15.0000 mL | Freq: Once | OROMUCOSAL | Status: AC
  Administered 2017-02-10: 15 mL via OROMUCOSAL
  Filled 2017-02-09: qty 15

## 2017-02-09 MED ORDER — ATORVASTATIN CALCIUM 40 MG PO TABS: 40.0000 mg | ORAL_TABLET | Freq: Every day | ORAL | Status: AC

## 2017-02-09 MED ORDER — LISINOPRIL 5 MG PO TABS
5.0000 mg | ORAL_TABLET | Freq: Every day | ORAL | Status: DC
  Administered 2017-02-09: 5 mg via ORAL
  Filled 2017-02-09: qty 1

## 2017-02-09 MED ORDER — TEMAZEPAM 15 MG PO CAPS: 15.0000 mg | ORAL_CAPSULE | Freq: Once | ORAL | Status: DC | PRN

## 2017-02-09 MED ORDER — POTASSIUM CHLORIDE 2 MEQ/ML IV SOLN
80.0000 meq | INTRAVENOUS | Status: DC
  Filled 2017-02-09: qty 40

## 2017-02-09 MED ORDER — CARVEDILOL 3.125 MG PO TABS
3.1250 mg | ORAL_TABLET | Freq: Two times a day (BID) | ORAL | Status: DC
  Administered 2017-02-09: 3.125 mg via ORAL
  Filled 2017-02-09: qty 1

## 2017-02-09 MED ORDER — IOPAMIDOL (ISOVUE-300) INJECTION 61%
INTRAVENOUS | Status: DC | PRN
  Administered 2017-02-09: 120 mL via INTRA_ARTERIAL

## 2017-02-09 MED ORDER — SODIUM CHLORIDE 0.9 % IV SOLN
250.0000 mL | INTRAVENOUS | Status: DC | PRN
  Administered 2017-02-09: 250 mL via INTRAVENOUS
  Administered 2017-02-10 (×2): via INTRAVENOUS

## 2017-02-09 MED ORDER — LIDOCAINE HCL (PF) 1 % IJ SOLN
INTRAMUSCULAR | Status: DC | PRN
  Administered 2017-02-09: 18 mL

## 2017-02-09 MED ORDER — HEPARIN (PORCINE) IN NACL 100-0.45 UNIT/ML-% IJ SOLN: 1050.0000 [IU]/h | INTRAMUSCULAR | Status: DC

## 2017-02-09 MED ORDER — SODIUM CHLORIDE 0.9 % IV SOLN
30.0000 ug/min | INTRAVENOUS | Status: AC
  Administered 2017-02-10: 20 ug/min via INTRAVENOUS
  Administered 2017-02-10: 40 ug/min via INTRAVENOUS
  Filled 2017-02-09: qty 2

## 2017-02-09 MED ORDER — SODIUM CHLORIDE 0.9 % IV SOLN
INTRAVENOUS | Status: AC
  Administered 2017-02-10: .8 [IU]/h via INTRAVENOUS
  Filled 2017-02-09: qty 2.5

## 2017-02-09 MED ORDER — BISACODYL 5 MG PO TBEC: 5.0000 mg | DELAYED_RELEASE_TABLET | Freq: Once | ORAL | Status: DC

## 2017-02-09 MED ORDER — LISINOPRIL 5 MG PO TABS
5.0000 mg | ORAL_TABLET | Freq: Every day | ORAL | Status: DC
Start: 2017-02-10 — End: 2017-02-10

## 2017-02-09 MED ORDER — PRAVASTATIN SODIUM 20 MG PO TABS: 20.0000 mg | ORAL_TABLET | Freq: Every day | ORAL | Status: DC

## 2017-02-09 MED ORDER — NITROGLYCERIN IN D5W 200-5 MCG/ML-% IV SOLN
2.0000 ug/min | INTRAVENOUS | Status: AC
  Administered 2017-02-10: 5 ug/min via INTRAVENOUS
  Filled 2017-02-09: qty 250

## 2017-02-09 MED ORDER — TRANEXAMIC ACID (OHS) BOLUS VIA INFUSION
15.0000 mg/kg | INTRAVENOUS | Status: AC
  Administered 2017-02-10: 1272 mg via INTRAVENOUS
  Filled 2017-02-09: qty 1272

## 2017-02-09 MED ORDER — HEPARIN BOLUS VIA INFUSION
4000.0000 [IU] | Freq: Once | INTRAVENOUS | Status: DC
  Filled 2017-02-09: qty 4000

## 2017-02-09 MED ORDER — FENTANYL CITRATE (PF) 100 MCG/2ML IJ SOLN
INTRAMUSCULAR | Status: DC | PRN
Start: 2017-02-09 — End: 2017-02-09
  Administered 2017-02-09: 25 ug via INTRAVENOUS

## 2017-02-09 MED ORDER — METOPROLOL TARTRATE 25 MG PO TABS: 25.0000 mg | ORAL_TABLET | Freq: Two times a day (BID) | ORAL | Status: DC

## 2017-02-09 MED ORDER — EPINEPHRINE PF 1 MG/ML IJ SOLN
0.0000 ug/min | INTRAVENOUS | Status: DC
  Filled 2017-02-09: qty 4

## 2017-02-09 MED ORDER — SODIUM CHLORIDE 0.9 % WEIGHT BASED INFUSION: 1.0000 mL/kg/h | INTRAVENOUS | Status: DC

## 2017-02-09 MED ORDER — ASPIRIN 81 MG PO CHEW: 81.0000 mg | CHEWABLE_TABLET | Freq: Every day | ORAL | Status: DC

## 2017-02-09 MED ORDER — SODIUM CHLORIDE 0.9 % IV SOLN
INTRAVENOUS | Status: DC
  Administered 2017-02-09: 04:00:00 via INTRAVENOUS

## 2017-02-09 MED ORDER — SODIUM CHLORIDE 0.9% FLUSH
3.0000 mL | Freq: Two times a day (BID) | INTRAVENOUS | Status: DC
  Administered 2017-02-09: 3 mL via INTRAVENOUS

## 2017-02-09 MED ORDER — DEXTROSE 5 % IV SOLN
1.5000 g | INTRAVENOUS | Status: AC
  Administered 2017-02-10: 1.5 g via INTRAVENOUS
  Administered 2017-02-10: .75 g via INTRAVENOUS
  Filled 2017-02-09: qty 1.5

## 2017-02-09 MED ORDER — HEPARIN (PORCINE) IN NACL 100-0.45 UNIT/ML-% IJ SOLN
1250.0000 [IU]/h | INTRAMUSCULAR | Status: DC
  Administered 2017-02-09: 1050 [IU]/h via INTRAVENOUS
  Filled 2017-02-09: qty 250

## 2017-02-09 SURGICAL SUPPLY — 13 items
CATH 5FR JL4 DIAGNOSTIC (CATHETERS) ×2 IMPLANT
CATH INFINITI 5FR ANG PIGTAIL (CATHETERS) ×2 IMPLANT
CATH INFINITI JR4 5F (CATHETERS) ×2 IMPLANT
DEVICE CLOSURE MYNXGRIP 6/7F (Vascular Products) ×2 IMPLANT
KIT MANI 3VAL PERCEP (MISCELLANEOUS) ×3 IMPLANT
NDL PERC 18GX7CM (NEEDLE) IMPLANT
NEEDLE PERC 18GX7CM (NEEDLE) ×3 IMPLANT
PACK CARDIAC CATH (CUSTOM PROCEDURE TRAY) ×3 IMPLANT
SHEATH AVANTI 5FR X 11CM (SHEATH) ×2 IMPLANT
SHEATH AVANTI 6FR X 11CM (SHEATH) ×2 IMPLANT
SHEATH BRITE TIP 6FR X 23 (SHEATH) ×2 IMPLANT
WIRE EMERALD 3MM-J .035X150CM (WIRE) ×4 IMPLANT
WIRE HITORQ VERSACORE ST 145CM (WIRE) ×2 IMPLANT

## 2017-02-09 NOTE — Progress Notes (Signed)
Patient had a 4 beats run of vtach while in the bathroom,asymptomatic.Will monitor closely.

## 2017-02-09 NOTE — Discharge Summary (Signed)
[facility] NAME: Seth Byrd    MR#:  [id number]  DATE OF BIRTH:  [birth year]  DATE OF ADMISSION:  02/09/2017   ADMITTING PHYSICIAN: Ubaldo Glassing Hugelmeyer, DO  DATE OF DISCHARGE: No discharge date for patient encounter.  PRIMARY CARE PHYSICIAN: Keith Rake, MD   ADMISSION DIAGNOSIS:  Chest pain, unspecified type [R07.9] DISCHARGE DIAGNOSIS:  Active Problems:   Chest pain, rule out acute myocardial infarction  NSTEMI. SECONDARY DIAGNOSIS:   Past Medical History:  Diagnosis Date  . Arthritis   . Hypertension    HOSPITAL COURSE:  This is a 71 y.o. male with a history of OA/RA, HTN, HLD prostate cancer, COPD.  NSTEMI. Cath revealed very proximial left circumflex and lad. Insignificant disease in rca. EF moderate to severely reduced with ef 30-35%. Will  Need consideration for cabg. Will change beta blocker to carvedilol 6.25 mg bid, continue with lisinopril and high intensity statin. Will place on heparin without bolus. Phase 2 cardiac rehab. Dr. Servando Snare at [facility] cardiovascular surgery accepted the patient per Dr. Ubaldo Glassing.  #. Hypokalemia, mild -Replace by mouth  #. History of hyperlipidemia -Continue lipitor. DISCHARGE CONDITIONS:  Guarded, transfer to [facility] hospital for CABG. CONSULTS OBTAINED:  Treatment Team:  [name], MD [name], MD DRUG ALLERGIES:  No Known Allergies DISCHARGE MEDICATIONS:   Allergies as of 02/09/2017   No Known Allergies     Medication List    STOP taking these medications   lovastatin 20 MG tablet Commonly known as:  MEVACOR   meloxicam 15 MG tablet Commonly known as:  MOBIC     TAKE these medications   acetaminophen 500 MG tablet Commonly known as:  TYLENOL Take 500 mg by mouth every 6 (six) hours as needed.   aspirin EC 81 MG tablet Take 81 mg by mouth daily.   atorvastatin 40 MG tablet Commonly known as:  LIPITOR Take 1 tablet (40 mg total) by mouth daily at 6  PM.   etanercept 50 MG/ML injection Commonly known as:  ENBREL Inject 50 mg into the skin once a week. Saturday   Fish Oil 1200 MG Caps Take 1 capsule by mouth 3 (three) times daily.   heparin 100-0.45 UNIT/ML-% infusion Inject 1,050 Units/hr into the vein continuous.   lisinopril 5 MG tablet Commonly known as:  PRINIVIL,ZESTRIL Take 1 tablet (5 mg total) by mouth daily.   metoprolol tartrate 25 MG tablet Commonly known as:  LOPRESSOR Take 1 tablet (25 mg total) by mouth 2 (two) times daily.   nitroGLYCERIN 0.4 MG SL tablet Commonly known as:  NITROSTAT Place 1 tablet (0.4 mg total) under the tongue every 5 (five) minutes as needed for chest pain (hold for blood pressure less than 90).   ONE-A-DAY MENS 50+ ADVANTAGE PO Take 1 tablet by mouth daily.   vitamin B-12 1000 MCG tablet Commonly known as:  CYANOCOBALAMIN Take 5,000 mcg by mouth daily.        DISCHARGE INSTRUCTIONS:  See AVS.  If you experience worsening of your admission symptoms, develop shortness of breath, life threatening emergency, suicidal or homicidal thoughts you must seek medical attention immediately by calling 911 or calling your MD immediately  if symptoms less severe.  You Must read complete instructions/literature along with all the possible adverse reactions/side effects for all the Medicines you take and that have been prescribed to you. Take any new Medicines after you have completely understood and accpet all the possible  adverse reactions/side effects.   Please note  You were cared for by a hospitalist during your hospital stay. If you have any questions about your discharge medications or the care you received while you were in the hospital after you are discharged, you can call the unit and asked to speak with the hospitalist on call if the hospitalist that took care of you is not available. Once you are discharged, your primary care physician will handle any further medical issues. Please  note that NO REFILLS for any discharge medications will be authorized once you are discharged, as it is imperative that you return to your primary care physician (or establish a relationship with a primary care physician if you do not have one) for your aftercare needs so that they can reassess your need for medications and monitor your lab values.    On the day of Discharge:  VITAL SIGNS:  Blood pressure (!) 145/72, pulse 76, temperature 97.7 F (36.5 C), temperature source Oral, resp. rate 15, height 5\' 11"  (1.803 m), weight 187 lb (84.8 kg), SpO2 95 %. PHYSICAL EXAMINATION:  GENERAL:  71 y.o.-year-old patient lying in the bed with no acute distress.  EYES: Pupils equal, round, reactive to light and accommodation. No scleral icterus. Extraocular muscles intact.  HEENT: Head atraumatic, normocephalic. Oropharynx and nasopharynx clear.  NECK:  Supple, no jugular venous distention. No thyroid enlargement, no tenderness.  LUNGS: Normal breath sounds bilaterally, no wheezing, rales,rhonchi or crepitation. No use of accessory muscles of respiration.  CARDIOVASCULAR: S1, S2 normal. No murmurs, rubs, or gallops.  ABDOMEN: Soft, non-tender, non-distended. Bowel sounds present. No organomegaly or mass.  EXTREMITIES: No pedal edema, cyanosis, or clubbing.  NEUROLOGIC: Cranial nerves II through XII are intact. Muscle strength 5/5 in all extremities. Sensation intact. Gait not checked.  PSYCHIATRIC: The patient is alert and oriented x 3.  SKIN: No obvious rash, lesion, or ulcer.  DATA REVIEW:   CBC  Recent Labs Lab 02/09/17 0037  WBC 5.4  HGB 13.5  HCT 39.2*  PLT 138*    Chemistries   Recent Labs Lab 02/09/17 0037  NA 138  K 3.4*  CL 108  CO2 23  GLUCOSE 140*  BUN 16  CREATININE 1.11  CALCIUM 9.4  AST 37  ALT 27  ALKPHOS 15  BILITOT 1.0     Microbiology Results  Results for orders placed or performed during the hospital encounter of 04/10/15  Surgical pcr screen      Status: None   Collection Time: 04/10/15  9:41 AM  Result Value Ref Range Status   MRSA, PCR NEGATIVE NEGATIVE Final   Staphylococcus aureus NEGATIVE NEGATIVE Final    Comment:        The Xpert SA Assay (FDA approved for NASAL specimens in patients over 81 years of age), is one component of a comprehensive surveillance program.  Test performance has been validated by [facility] for patients greater than or equal to 39 year old. It is not intended to diagnose infection nor to guide or monitor treatment.     RADIOLOGY:  Dg Chest 2 View  Result Date: 02/09/2017 CLINICAL DATA:  Left-sided chest pain and dyspnea, onset at 20:00 EXAM: CHEST  2 VIEW COMPARISON:  07/07/2006 FINDINGS: Diffuse interstitial coarsening, likely chronic. No confluent airspace consolidation. Emphysematous changes, upper lobe predominant. No effusion. Normal heart size. Hilar and mediastinal contours are unremarkable. IMPRESSION: Emphysematous changes and chronic appearing interstitial coarsening. No confluent consolidation. No effusion. Electronically Signed   By: Quillian Quince  Armandina Stammer M.D.   On: 02/09/2017 01:32     Management plans discussed with the patient, family and they are in agreement.  CODE STATUS: Full Code   TOTAL TIME TAKING CARE OF THIS PATIENT: 45 minutes.    Demetrios Loll M.D on 02/09/2017 at 12:30 PM  Between 7am to 6pm - Pager - [phone]  After 6pm go to [email or web address] - Proofreader  [facility]  Office  [phone]  CC: Primary care physician; Keith Rake, MD   Note: This dictation was prepared with Dragon dictation along with smaller phrase technology. Any transcriptional errors that result from this process are unintentional.

## 2017-02-09 NOTE — Progress Notes (Signed)
VASCULAR LAB PRELIMINARY  PRELIMINARY  PRELIMINARY  PRELIMINARY  Pre-op Cardiac Surgery  Carotid Findings:   Findings are consistent with a 1-39 percent stenosis involving the right internal carotid artery and the left internal carotid artery. The vertebral arteries demonstrate antegrade flow.  Upper Extremity Right Left  Brachial Pressures 139  Triphasic 128  Triphasic  Radial Waveforms Triphasic Triphasic  Ulnar Waveforms Triphasic Triphasic  Palmar Arch (Allen's Test) Palmar waveforms remain within normal limits with radial and ulnar compression. Palmar waveforms are obliterated with radial compression and remain within normal limits with ulnar compression.    Lower  Extremity Right Left  Dorsalis Pedis 115 83  Posterior Tibial 98 94  Ankle/Brachial Indices 0.83 0.68   Findings:   Right ABI of 0.83 is suggestive of mild arterial occlusive disease at rest. Left ABI of 0.68 is suggestive of moderate arterial occlusive disease at rest.  Legrand Como, RVT 02/09/2017, 7:55 PM

## 2017-02-09 NOTE — Consult Note (Addendum)
[facility]  CARDIOLOGY CONSULT NOTE  Patient ID: Seth Byrd MRN: [id number] DOB/AGE: 1945-11-17 71 y.o.  Admit date: 02/09/2017 Referring Physician Dr. Bridgett Larsson Primary Physician   Primary Cardiologist   Reason for Consultation nstemi  HPI: 71 yo male with no prior cardiac history with history of hypertension admitted with midsternal chest pain and has ruled in for a nstemi. EKG showed deep t wave inversion in the anterolateral leads. No current chest pain. Will treat with asa, high intensity statin, beta blockers and nitrates as needed. Afterload reduction with ace I. Will need cardiac cath for further evaluation post nstemi. Review of Systems  Constitutional: Negative.   HENT: Negative.   Eyes: Negative.   Respiratory: Positive for shortness of breath.   Cardiovascular: Positive for chest pain.  Gastrointestinal: Negative.   Genitourinary: Negative.   Musculoskeletal: Negative.   Skin: Negative.   Neurological: Negative.   Endo/Heme/Allergies: Negative.   Psychiatric/Behavioral: Negative.     Past Medical History:  Diagnosis Date  . Arthritis   . Hypertension     Family History  Problem Relation Age of Onset  . Hypertension Mother   . Transient ischemic attack Mother   . CVA Mother   . Colon cancer Father   . Hypertension Father   . Prostate cancer Father   . Diabetes Brother     Social History   Social History  . Marital status: Married    Spouse name: N/A  . Number of children: N/A  . Years of education: N/A   Occupational History  . Not on file.   Social History Main Topics  . Smoking status: Former Smoker    Packs/day: 1.50    Types: Cigarettes    Quit date: 04/09/2005  . Smokeless tobacco: Never Used  . Alcohol use No  . Drug use: No  . Sexual activity: Not on file   Other Topics Concern  . Not on file   Social History Narrative  . No narrative on file    Past Surgical History:  Procedure Laterality Date   . BACK SURGERY     Spinal Fusion with 2 screws  . JOINT REPLACEMENT    . TOTAL KNEE ARTHROPLASTY Left 04/23/2015   Procedure: TOTAL KNEE ARTHROPLASTY;  Surgeon: Christophe Louis, MD;  Location: [facility] ORS;  Service: Orthopedics;  Laterality: Left;     Prescriptions Prior to Admission  Medication Sig Dispense Refill Last Dose  . acetaminophen (TYLENOL) 500 MG tablet Take 500 mg by mouth every 6 (six) hours as needed.   Taking  . aspirin EC 81 MG tablet Take 81 mg by mouth daily.   Taking  . etanercept (ENBREL) 50 MG/ML injection Inject 50 mg into the skin once a week. Saturday   Taking  . lovastatin (MEVACOR) 20 MG tablet Take 1 tablet (20 mg total) by mouth at bedtime. 90 tablet 1   . meloxicam (MOBIC) 15 MG tablet Take 1 tablet (15 mg total) by mouth daily. 90 tablet 1   . Multiple Vitamins-Minerals (ONE-A-DAY MENS 50+ ADVANTAGE PO) Take 1 tablet by mouth daily.   Taking  . Omega-3 Fatty Acids (FISH OIL) 1200 MG CAPS Take 1 capsule by mouth 3 (three) times daily.   Taking  . vitamin B-12 (CYANOCOBALAMIN) 1000 MCG tablet Take 5,000 mcg by mouth daily.   Taking    Physical Exam: Blood pressure (!) 147/69, pulse 74, temperature 98.2 F (36.8 C), temperature source Oral, resp. rate 16, height 5\' 11"  (1.803  m), weight 85.1 kg (187 lb 11.2 oz), SpO2 93 %.   Wt Readings from Last 1 Encounters:  02/09/17 85.1 kg (187 lb 11.2 oz)     General appearance: alert and cooperative Resp: clear to auscultation bilaterally Chest wall: no tenderness Cardio: regular rate and rhythm GI: soft, non-tender; bowel sounds normal; no masses,  no organomegaly Extremities: extremities normal, atraumatic, no cyanosis or edema Neurologic: Grossly normal  Labs:   Lab Results  Component Value Date   WBC 5.4 02/09/2017   HGB 13.5 02/09/2017   HCT 39.2 (L) 02/09/2017   MCV 94.9 02/09/2017   PLT 138 (L) 02/09/2017    Recent Labs Lab 02/09/17 0037  NA 138  K 3.4*  CL 108  CO2 23  BUN 16  CREATININE  1.11  CALCIUM 9.4  PROT 7.4  BILITOT 1.0  ALKPHOS 66  ALT 27  AST 37  GLUCOSE 140*   Lab Results  Component Value Date   TROPONINI 1.50 ([place]) 02/09/2017      Radiology: no acute cardiopulmonary disease.  EKG: nsr with anterolateral st depression at present with mild st elevation in the anterolateral leads on presentation.   ASSESSMENT AND PLAN:  71 yo male with no prior cardiac history with history of hypertension admitted with midsternal chest pain and has ruled in for a nstemi. EKG showed deep t wave inversion in the anterolateral leads. No current chest pain. Will treat with asa, high intensity statin, beta blockers and nitrates as needed. Afterload reduction with ace I. Will proceed with left cardiac cath this am. Further recs after cath.  Signed: [name] MD, [facility] 02/09/2017, 9:58 AM   Pt seen and examined. No change from above.

## 2017-02-09 NOTE — ED Notes (Signed)
Pt went to radiology.

## 2017-02-09 NOTE — ED Provider Notes (Signed)
[facility] Emergency Department Provider Note   ____________________________________________   First MD Initiated Contact with Patient 02/09/17 0025     (approximate)  I have reviewed the triage vital signs and the nursing notes.   HISTORY  Chief Complaint Chest Pain    HPI Seth Byrd is a 71 y.o. male who comes into the hospital today with chest pain. The patient reports that the pain is radiating down his left arm. He said he felt something a few days ago but it went away. Tonight though the pain started up very badly. The patient reports it was around 9:00. He called his daughter and she brought him here. He initially thought that his pain was indigestion the patient reports that he didn't take any medicine at home. He's had some shortness of breath and sweats but denies any nausea or vomiting. He reports that he has not had pain like this in the past. He's had pains but not this badly. He reports that this pain is 8 out of 10 in intensity. The patient's here today for evaluation.   Past Medical History:  Diagnosis Date  . Arthritis   . Hypertension     Patient Active Problem List   Diagnosis Date Noted  . Chest pain, rule out acute myocardial infarction 02/09/2017  . Essential hypertension 11/25/2015  . Hyperlipidemia 11/25/2015  . Panlobular emphysema ([place]) 11/25/2015  . 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 Primary osteoarthritis involving multiple joints 11/25/2015  . Primary osteoarthritis of knee 04/23/2015    Past Surgical History:  Procedure Laterality Date  . BACK SURGERY     Spinal Fusion with 2 screws  . JOINT REPLACEMENT    . TOTAL KNEE ARTHROPLASTY Left 04/23/2015   Procedure: TOTAL KNEE ARTHROPLASTY;  Surgeon: Christophe Louis, MD;  Location: [facility] ORS;  Service: Orthopedics;  Laterality: Left;    Prior to Admission medications   Medication Sig Start Date End Date Taking? Authorizing Provider    acetaminophen (TYLENOL) 500 MG tablet Take 500 mg by mouth every 6 (six) hours as needed.    Historical Provider, MD  aspirin EC 81 MG tablet Take 81 mg by mouth daily.    Historical Provider, MD  etanercept (ENBREL) 50 MG/ML injection Inject 50 mg into the skin once a week. Saturday    Historical Provider, MD  lovastatin (MEVACOR) 20 MG tablet Take 1 tablet (20 mg total) by mouth at bedtime. 02/05/17   Roselee Nova, MD  meloxicam (MOBIC) 15 MG tablet Take 1 tablet (15 mg total) by mouth daily. 02/05/17   Roselee Nova, MD  Multiple Vitamins-Minerals (ONE-A-DAY MENS 50+ ADVANTAGE PO) Take 1 tablet by mouth daily.    Historical Provider, MD  Omega-3 Fatty Acids (FISH OIL) 1200 MG CAPS Take 1 capsule by mouth 3 (three) times daily.    Historical Provider, MD  vitamin B-12 (CYANOCOBALAMIN) 1000 MCG tablet Take 5,000 mcg by mouth daily.    Historical Provider, MD    Allergies Patient has no known allergies.  Family History  Problem Relation Age of Onset  . Hypertension Mother   . Transient ischemic attack Mother   . CVA Mother   . Colon cancer Father   . Hypertension Father   . Prostate cancer Father   . Diabetes Brother     Social History Social History  Substance Use Topics  . Smoking status: Former Smoker    Packs/day: 1.50    Types: Cigarettes  Quit date: 04/09/2005  . Smokeless tobacco: Never Used  . Alcohol use No    Review of Systems Constitutional: Sweats Eyes: No visual changes. ENT: No sore throat. Cardiovascular: chest pain. Respiratory:  shortness of breath. Gastrointestinal: No abdominal pain.  No nausea, no vomiting.  No diarrhea.  No constipation. Genitourinary: Negative for dysuria. Musculoskeletal: Negative for back pain. Skin: Negative for rash. Neurological: Negative for headaches, focal weakness or numbness.  10-point ROS otherwise negative.  ____________________________________________   PHYSICAL EXAM:  VITAL SIGNS: ED Triage Vitals  Enc  Vitals Group     BP 02/09/17 0008 (!) 164/93     Pulse Rate 02/09/17 0008 96     Resp 02/09/17 0008 20     Temp 02/09/17 0008 97.8 F (36.6 C)     Temp Source 02/09/17 0008 Oral     SpO2 02/09/17 0008 93 %     Weight 02/09/17 0005 187 lb (84.8 kg)     Height 02/09/17 0005 5\' 11"  (1.803 m)     Head Circumference --      Peak Flow --      Pain Score 02/09/17 0005 8     Pain Loc --      Pain Edu? --      Excl. in [place]? --     Constitutional: Alert and oriented. Well appearing and in mild distress. Eyes: Conjunctivae are normal. PERRL. EOMI. Head: Atraumatic. Nose: No congestion/rhinnorhea. Mouth/Throat: Mucous membranes are moist.  Oropharynx non-erythematous. Cardiovascular: Normal rate, regular rhythm. Grossly normal heart sounds.  Good peripheral circulation. Respiratory: Normal respiratory effort.  No retractions. Lungs CTAB. Gastrointestinal: Soft and nontender. No distention. Positive bowel sounds Musculoskeletal: No lower extremity tenderness nor edema.  Neurologic:  Normal speech and language.  Skin:  Skin is warm, dry and intact.  Psychiatric: Mood and affect are normal.  ____________________________________________   LABS (all labs ordered are listed, but only abnormal results are displayed)  Labs Reviewed  COMPREHENSIVE METABOLIC PANEL - Abnormal; Notable for the following:       Result Value   Potassium 3.4 (*)    Glucose, Bld 140 (*)    All other components within normal limits  CBC WITH DIFFERENTIAL/PLATELET - Abnormal; Notable for the following:    RBC 4.13 (*)    HCT 39.2 (*)    Platelets 138 (*)    All other components within normal limits  TROPONIN I - Abnormal; Notable for the following:    Troponin I 0.05 (*)    All other components within normal limits   ____________________________________________  EKG  ED ECG REPORT #1 I, Loney Hering, the attending physician, personally viewed and interpreted this ECG.   Date: 02/09/2017  EKG Time:  0007  Rate: 97  Rhythm: normal sinus rhythm, RBBB  Axis: left axis deviation  Intervals:right bundle branch block  ST&T Change: Flipped T waves in leads 1 and aVL, ST depression in leads V4 V5 and V6.  ED ECG REPORT I, Loney Hering, the attending physician, personally viewed and interpreted this ECG.   Date: 02/09/2017  EKG Time: 0023  Rate: 88  Rhythm: normal sinus rhythm, RBBB, prolonged QT interval  Axis: left axis deviation  Intervals:right bundle branch block  ST&T Change: Flipped T waves in leads 1 and aVL with some worsening ST depressions in leads V4, V5, V6 different from 2016.   ____________________________________________  RADIOLOGY  CXR ____________________________________________   PROCEDURES  Procedure(s) performed: None  Procedures  Critical Care performed:  No  ____________________________________________   INITIAL IMPRESSION / ASSESSMENT AND PLAN / ED COURSE  Pertinent labs & imaging results that were available during my care of the patient were reviewed by me and considered in my medical decision making (see chart for details).  This is a 71 year old male who comes into the hospital today with chest pain. The patient does have some elevated blood pressure as well. I did give the patient 324 aspirin and some nitroglycerin. The patient has some flipped T waves in his lateral leads as well as some ST depressions. He does not have a STEMI but these EKG changes are different from 2016. I will await the results of the blood work and I will reassess the patient's pain. Given the changes I will likely admit the patient for further evaluation of his chest pain.  Clinical Course as of Feb 09 250  Tue Feb 09, 2017  0143 Emphysematous changes and chronic appearing interstitial coarsening. No confluent consolidation. No effusion.   DG Chest 2 View [AW]    Clinical Course User Index [AW] Loney Hering, MD   The patient's pain is improved to a 2  out of 10 after some nitroglycerin. Given the patient's EKG changes I will admit the patient to the hospitalist service. His troponin is 0.05.  ____________________________________________   FINAL CLINICAL IMPRESSION(S) / ED DIAGNOSES  Final diagnoses:  Chest pain, unspecified type      NEW MEDICATIONS STARTED DURING THIS VISIT:  New Prescriptions   No medications on file     Note:  This document was prepared using Dragon voice recognition software and may include unintentional dictation errors.    Loney Hering, MD 02/09/17 [phone]

## 2017-02-09 NOTE — Progress Notes (Signed)
Patient discharged to [facility] for surgery and transported by [facility].Family at bedside,vital signs within normal limits upon discharge.

## 2017-02-09 NOTE — H&P (Signed)
History and Physical   [facility] [place] @ [facility] Admission History and Physical [employer], D.O.    Patient Name: Seth Byrd MR#: [id number] Date of Birth: August 14, 1946 Date of Admission: 02/09/2017  Referring MD/NP/PA: Dr. Dahlia Client Primary Care Physician: Keith Rake, MD Patient coming from: Home Outpatient Specialists: Urology, Ortho   Chief Complaint:  Chief Complaint  Patient presents with  . Chest Pain    HPI: Seth Byrd is a 71 y.o. male with a known history of OA, HTN, HLD prostate cancer, COPD presents to the emergency department for evaluation of Chest pain.  Patient was in a usual state of health until this afternoon when he states that he "just felt something wasn't right." He called his daughter to drive him to the hospital for symptoms which he thought were consistent with indigestion. He reports substernal chest discomfort associated with nausea, diaphoresis and shortness of breath. He did have an episode several days ago which resolved on its own. He came to the emergency department tonight because the pain was more severe and did not resolve..  Patient denies fevers/chills, weakness, dizziness, N/V/C/D, abdominal pain, dysuria/frequency, changes in mental status.    Otherwise there has been no change in status. Patient has been taking medication as prescribed and there has been no recent change in medication or diet.  No recent antibiotics.  There has been no recent illness, hospitalizations, travel or sick contacts.   EMS/ED Course: Patient received aspirin 324 and nitroglycerin which improved his symptoms.  Review of Systems:  CONSTITUTIONAL: No fever/chills, fatigue, weakness, weight gain/loss, headache. EYES: No blurry or double vision. ENT: No tinnitus, postnasal drip, redness or soreness of the oropharynx. RESPIRATORY: No cough, dyspnea, wheeze.  No hemoptysis.  CARDIOVASCULAR: Positive chest pain, Negative palpitations, syncope, orthopnea.  No lower extremity edema.  GASTROINTESTINAL: Positive nausea, Negative vomiting, abdominal pain, diarrhea, constipation.  No hematemesis, melena or hematochezia. GENITOURINARY: No dysuria, frequency, hematuria. ENDOCRINE: No polyuria or nocturia. No heat or cold intolerance. HEMATOLOGY: No anemia, bruising, bleeding. INTEGUMENTARY: No rashes, ulcers, lesions. MUSCULOSKELETAL: No arthritis, gout, dyspnea. NEUROLOGIC: No numbness, tingling, ataxia, seizure-type activity, weakness. PSYCHIATRIC: No anxiety, depression, insomnia.   Past Medical History:  Diagnosis Date  . Arthritis   . Hypertension     Past Surgical History:  Procedure Laterality Date  . BACK SURGERY     Spinal Fusion with 2 screws  . JOINT REPLACEMENT    . TOTAL KNEE ARTHROPLASTY Left 04/23/2015   Procedure: TOTAL KNEE ARTHROPLASTY;  Surgeon: Christophe Louis, MD;  Location: [facility] ORS;  Service: Orthopedics;  Laterality: Left;     reports that he quit smoking about 11 years ago. His smoking use included Cigarettes. He smoked 1.50 packs per day. He has never used smokeless tobacco. He reports that he does not drink alcohol or use drugs.  No Known Allergies  Family History  Problem Relation Age of Onset  . Hypertension Mother   . Transient ischemic attack Mother   . CVA Mother   . Colon cancer Father   . Hypertension Father   . Prostate cancer Father   . Diabetes Brother     Prior to Admission medications   Medication Sig Start Date End Date Taking? Authorizing Provider  acetaminophen (TYLENOL) 500 MG tablet Take 500 mg by mouth every 6 (six) hours as needed.    Historical Provider, MD  aspirin EC 81 MG tablet Take 81 mg by mouth daily.    Historical Provider, MD  etanercept (ENBREL) 50 MG/ML  injection Inject 50 mg into the skin once a week. Saturday    Historical Provider, MD  lovastatin (MEVACOR) 20 MG tablet Take 1 tablet (20 mg total) by mouth at bedtime. 02/05/17   Roselee Nova, MD  meloxicam (MOBIC)  15 MG tablet Take 1 tablet (15 mg total) by mouth daily. 02/05/17   Roselee Nova, MD  Multiple Vitamins-Minerals (ONE-A-DAY MENS 50+ ADVANTAGE PO) Take 1 tablet by mouth daily.    Historical Provider, MD  Omega-3 Fatty Acids (FISH OIL) 1200 MG CAPS Take 1 capsule by mouth 3 (three) times daily.    Historical Provider, MD  vitamin B-12 (CYANOCOBALAMIN) 1000 MCG tablet Take 5,000 mcg by mouth daily.    Historical Provider, MD    Physical Exam: Vitals:   02/09/17 0030 02/09/17 0031 02/09/17 0105 02/09/17 0130  BP: (!) 179/94  (!) 157/79 (!) 144/75  Pulse:  93 80 82  Resp: 20 19 (!) 22 19  Temp:      TempSrc:      SpO2:  96% 96% 94%  Weight:      Height:        GENERAL: 71 y.o.-year-old White male patient, well-developed, well-nourished lying in the bed in no acute distress.  Pleasant and cooperative.   HEENT: Head atraumatic, normocephalic. Pupils equal, round, reactive to light and accommodation. No scleral icterus. Extraocular muscles intact.. Mucus membranes moist. NECK: Supple, full range of motion. No JVD, no bruit heard.  CHEST: Normal breath sounds bilaterally. No wheezing, rales, rhonchi or crackles. No use of accessory muscles of respiration.  No reproducible chest wall tenderness.  CARDIOVASCULAR: S1, S2 normal. No murmurs, rubs, or gallops. Cap refill <2 seconds. Pulses intact distally.  ABDOMEN: Soft, nondistended, nontender. No rebound, guarding, rigidity. Normoactive bowel sounds present in all four quadrants. No organomegaly or mass. EXTREMITIES: No pedal edema, cyanosis, or clubbing. No calf tenderness or Homan's sign.  NEUROLOGIC: The patient is alert and oriented x 3. Cranial nerves II through XII are grossly intact with no focal sensorimotor deficit. Muscle strength 5/5 in all extremities. Sensation intact. Gait not checked. PSYCHIATRIC:  Normal affect, mood, thought content. SKIN: Warm, dry, and intact without obvious rash, lesion, or ulcer.    Labs on  Admission:  CBC:  Recent Labs Lab 02/09/17 0037  WBC 5.4  NEUTROABS 3.5  HGB 13.5  HCT 39.2*  MCV 94.9  PLT 381*   Basic Metabolic Panel:  Recent Labs Lab 02/09/17 0037  NA 138  K 3.4*  CL 108  CO2 23  GLUCOSE 140*  BUN 16  CREATININE 1.11  CALCIUM 9.4   GFR: Estimated Creatinine Clearance: 66 mL/min (by C-G formula based on SCr of 1.11 mg/dL). Liver Function Tests:  Recent Labs Lab 02/09/17 0037  AST 37  ALT 27  ALKPHOS 66  BILITOT 1.0  PROT 7.4  ALBUMIN 3.8   No results for input(s): LIPASE, AMYLASE in the last 168 hours. No results for input(s): AMMONIA in the last 168 hours. Coagulation Profile: No results for input(s): INR, PROTIME in the last 168 hours. Cardiac Enzymes:  Recent Labs Lab 02/09/17 0037  TROPONINI 0.05*   BNP (last 3 results) No results for input(s): PROBNP in the last 8760 hours. HbA1C: No results for input(s): HGBA1C in the last 72 hours. CBG: No results for input(s): GLUCAP in the last 168 hours. Lipid Profile: No results for input(s): CHOL, HDL, LDLCALC, TRIG, CHOLHDL, LDLDIRECT in the last 72 hours. Thyroid Function Tests: No results  for input(s): TSH, T4TOTAL, FREET4, T3FREE, THYROIDAB in the last 72 hours. Anemia Panel: No results for input(s): VITAMINB12, FOLATE, FERRITIN, TIBC, IRON, RETICCTPCT in the last 72 hours. Urine analysis:    Component Value Date/Time   COLORURINE YELLOW (A) 04/10/2015 0954   APPEARANCEUR CLEAR (A) 04/10/2015 0954   LABSPEC 1.016 04/10/2015 0954   PHURINE 6.0 04/10/2015 0954   GLUCOSEU NEGATIVE 04/10/2015 0954   HGBUR NEGATIVE 04/10/2015 0954   BILIRUBINUR NEGATIVE 04/10/2015 0954   KETONESUR NEGATIVE 04/10/2015 0954   PROTEINUR NEGATIVE 04/10/2015 0954   NITRITE NEGATIVE 04/10/2015 0954   LEUKOCYTESUR NEGATIVE 04/10/2015 0954   Sepsis Labs: @LABRCNTIP (procalcitonin:4,lacticidven:4) )No results found for this or any previous visit (from the past 240 hour(s)).   Radiological Exams  on Admission: Dg Chest 2 View  Result Date: 02/09/2017 CLINICAL DATA:  Left-sided chest pain and dyspnea, onset at 20:00 EXAM: CHEST  2 VIEW COMPARISON:  07/07/2006 FINDINGS: Diffuse interstitial coarsening, likely chronic. No confluent airspace consolidation. Emphysematous changes, upper lobe predominant. No effusion. Normal heart size. Hilar and mediastinal contours are unremarkable. IMPRESSION: Emphysematous changes and chronic appearing interstitial coarsening. No confluent consolidation. No effusion. Electronically Signed   By: Andreas Newport M.D.   On: 02/09/2017 01:32    EKG: Normal sinus rhythm at 97 bpm with leftward axis, RBBB, T wave invesions in I and aVL, ST depressions in V4-6 and nonspecific ST-T wave changes.   Assessment/Plan  This is a 71 y.o. male with a history of OA/RA, HTN, HLD prostate cancer, COPD now being admitted with:  #. Chest pain, rule out ACS - Admit to observation with telemetry monitoring. - Trend troponins, check lipids and TSH. - Morphine, nitro, beta blocker, aspirin and statin ordered.   - Check echo - Cardiology consultation has been requested. -Hold Mobic for consideration of intervention  #. Hypokalemia, mild -Replace by mouth  #. History of hyperlipidemia -Continue lovastatin  Admission status: Observation, telemetry IV Fluids: Hep-Lock Diet/Nutrition: Nothing by mouth Consults called: Cardiology  DVT Px: Heparin, SCDs and early ambulation. Code Status: Full Code  Disposition Plan: To home in <1 day  All the records are reviewed and case discussed with ED provider. Management plans discussed with the patient and family who express understanding and agree with plan of care.  Azar South D.O. on 02/09/2017 at 2:27 AM Between 7am to 6pm - Pager - [phone] After 6pm go to [email or web address] - [employer] [facility] [place] [facility] Office [phone] CC: Primary care physician; Keith Rake, MD   02/09/2017, 2:27 AM

## 2017-02-09 NOTE — Progress Notes (Signed)
ANTICOAGULATION CONSULT NOTE - Initial Consult  Pharmacy Consult for Heparin  Indication: ACS   No Known Allergies  Patient Measurements: Height: 5\' 11"  (180.3 cm) Weight: 187 lb 11.2 oz (85.1 kg) IBW/kg (Calculated) : 75.3 Heparin Dosing Weight:   Vital Signs: Temp: 98.2 F (36.8 C) (04/03 0721) Temp Source: Oral (04/03 0721) BP: 147/69 (04/03 0721) Pulse Rate: 74 (04/03 0721)  Labs:  Recent Labs  02/09/17 0037 02/09/17 0340 02/09/17 0644  HGB 13.5  --   --   HCT 39.2*  --   --   PLT 138*  --   --   APTT  --   --  34  LABPROT  --   --  13.5  INR  --   --  1.03  CREATININE 1.11  --   --   TROPONINI 0.05* 0.54* 1.50*    Estimated Creatinine Clearance: 66 mL/min (by C-G formula based on SCr of 1.11 mg/dL).   Medical History: Past Medical History:  Diagnosis Date  . Arthritis   . Hypertension     Medications:  Prescriptions Prior to Admission  Medication Sig Dispense Refill Last Dose  . acetaminophen (TYLENOL) 500 MG tablet Take 500 mg by mouth every 6 (six) hours as needed.   Taking  . aspirin EC 81 MG tablet Take 81 mg by mouth daily.   Taking  . etanercept (ENBREL) 50 MG/ML injection Inject 50 mg into the skin once a week. Saturday   Taking  . lovastatin (MEVACOR) 20 MG tablet Take 1 tablet (20 mg total) by mouth at bedtime. 90 tablet 1   . meloxicam (MOBIC) 15 MG tablet Take 1 tablet (15 mg total) by mouth daily. 90 tablet 1   . Multiple Vitamins-Minerals (ONE-A-DAY MENS 50+ ADVANTAGE PO) Take 1 tablet by mouth daily.   Taking  . Omega-3 Fatty Acids (FISH OIL) 1200 MG CAPS Take 1 capsule by mouth 3 (three) times daily.   Taking  . vitamin B-12 (CYANOCOBALAMIN) 1000 MCG tablet Take 5,000 mcg by mouth daily.   Taking   Scheduled:  . aspirin EC  325 mg Oral Daily  . heparin  4,000 Units Intravenous Once  . metoprolol tartrate  25 mg Oral BID  . potassium chloride  40 mEq Oral BID  . pravastatin  20 mg Oral q1800  . vitamin B-12  5,000 mcg Oral Daily     Assessment: Pharmacy consulted to dose and monitor heparin in this 71 year old with ACS  Goal of Therapy:  Heparin level 0.3-0.7 units/ml Monitor platelets by anticoagulation protocol: Yes   Plan:  Give 4000 units bolus x 1 Start heparin infusion at 1050 units/hr Check anti-Xa level in 8 hours and daily while on heparin Continue to monitor H&H and platelets  Helga Asbury D 02/09/2017,8:33 AM

## 2017-02-09 NOTE — Plan of Care (Signed)
Problem: Pain Managment: Goal: General experience of comfort will improve Outcome: Progressing No complaints of pain since pt came up from ED. Will continue to monitor.  Problem: Tissue Perfusion: Goal: Risk factors for ineffective tissue perfusion will decrease Outcome: Progressing Heparin subcutaneously for VTE

## 2017-02-09 NOTE — ED Notes (Signed)
Pt's O2 sats noted to be in the low 90s, pt denies any hx of COPD, asthma; pt put on 2L O2 via [place]

## 2017-02-09 NOTE — ED Triage Notes (Signed)
Pt to triage via wheelchair.  Pt has chest pain radiating  Into left arm.  Intermittent pain.  Intermittent sob.  Pt alert.  Speech clear.

## 2017-02-09 NOTE — Progress Notes (Addendum)
[address]       [place] [address]             [phone]        Seth Byrd Medical Record #[id number] Date of Birth: [birth year]  Referring: Fath Primary Care: Keith Rake, MD  Chief Complaint:  NSTEMI, + CAD  History of Present Illness:      [name] is a 71 you AA male with history of OA, HTN, Hyperlipidemia, Prostate cancer, and COPD.  He presented to the ED at [facility] last evening with complaint of chest pain.  He stated he was in his usual state of health until the afternoon.  At that time he stated he " just felt something wasn't right."  He thought he was experiencing indigestion with associated nausea, diaphoresis and shortness of breath.  He called his daughter who brought the patient to the ED.  Workup in the ED showed no ST elevation on EKG.  Cardiac enzymes were positive.  He was ruled in for NSTEMI, admitted by the medicine service and Cardiology consult was obtained.  He was evaluated by Dr. Ubaldo Glassing who performed cardiac catheterization this morning.  This revealed a reduced EF of 20%, and disease of the LAD and Circumflex distribution.  It was felt coronary bypass grafting would be indicated. LV gram was  under filled but suggested MR and severely depressed lv function.  Cardiac surgery consult was requested and he was transferred to [facility] for evaluation  .  Currently the patient is chest pain free.  He admits that he has been having episodes where he just doesn't feel right for several months with SOB and fatigue with activity  but they have resolved without problems.  He is a previous smoker, but quit a long time ago, but he did smoke about 1/2ppd.  He states his prostate cancer was treated a while ago as well, but he does have an elevated PSA with a new detection of a small amount of cancer on recent biopsy in January 2018, he is currently being followed for.  He lives alone and remains active riding a bike and walking.  He works with mail  for a living.    Lab Results  Component Value Date   TROPONINI 1.50 ([place]) 02/09/2017   Current Activity/ Functional Status: Patient is independent with mobility/ambulation, transfers, ADL's, IADL's.   Zubrod Score: At the time of surgery this patient's most appropriate activity status/level should be described as: []     0    Normal activity, no symptoms [x]     1    Restricted in physical strenuous activity but ambulatory, able to do out light work []     2    Ambulatory and capable of self care, unable to do work activities, up and about                 more than 50%  Of the time                            []     3    Only limited self care, in bed greater than 50% of waking hours []     4    Completely disabled, no self care, confined to bed or chair []     5    Moribund  Past Medical History:  Diagnosis Date  . Arthritis   . Hypertension  Past Surgical History:  Procedure Laterality Date  . BACK SURGERY     Spinal Fusion with 2 screws  . JOINT REPLACEMENT    . TOTAL KNEE ARTHROPLASTY Left 04/23/2015   Procedure: TOTAL KNEE ARTHROPLASTY;  Surgeon: Christophe Louis, MD;  Location: [facility] ORS;  Service: Orthopedics;  Laterality: Left;    History  Smoking Status  . Former Smoker  . Packs/day: 1.50  . Types: Cigarettes  . Quit date: 04/09/2005  Smokeless Tobacco  . Never Used    History  Alcohol Use No    Social History   Social History  . Marital status: Not married     Spouse name: N/A  . Number of children: N/A  . Years of education: N/A   Occupational History  . Works in [employer]    Social History Main Topics  . Smoking status: Former Smoker    Packs/day: 1.50    Types: Cigarettes    Quit date: 04/09/2005  . Smokeless tobacco: Never Used  . Alcohol use No  . Drug use: No  . Sexual activity: Not on file      No Known Allergies  Current Facility-Administered Medications  Medication Dose Route Frequency Provider Last Rate Last Dose  . 0.9 %  sodium  chloride infusion  250 mL Intravenous PRN Amy D Clegg, NP      . acetaminophen (TYLENOL) tablet 650 mg  650 mg Oral Q4H PRN Amy D Clegg, NP      . atorvastatin (LIPITOR) tablet 40 mg  40 mg Oral q1800 Amy D Clegg, NP   40 mg at 02/09/17 1746  . carvedilol (COREG) tablet 3.125 mg  3.125 mg Oral BID WC Amy D Clegg, NP   3.125 mg at 02/09/17 1746  . etanercept (ENBREL) 50 MG/ML injection 50 mg  50 mg Subcutaneous Weekly Amy D Clegg, NP      . heparin ADULT infusion 100 units/mL (25000 units/254mL sodium chloride 0.45%)  1,050 Units/hr Intravenous Continuous Lauren D Bajbus, RPH      . [START ON 02/10/2017] lisinopril (PRINIVIL,ZESTRIL) tablet 5 mg  5 mg Oral Daily Shaune Pascal Bensimhon, MD      . nitroGLYCERIN (NITROSTAT) SL tablet 0.4 mg  0.4 mg Sublingual Q5 min PRN Amy D Clegg, NP      . ondansetron (ZOFRAN) injection 4 mg  4 mg Intravenous Q6H PRN Amy D Clegg, NP      . sodium chloride flush (NS) 0.9 % injection 3 mL  3 mL Intravenous Q12H Amy D Clegg, NP      . sodium chloride flush (NS) 0.9 % injection 3 mL  3 mL Intravenous PRN Amy Estrella Deeds, NP        Prescriptions Prior to Admission  Medication Sig Dispense Refill Last Dose  . acetaminophen (TYLENOL) 500 MG tablet Take 500 mg by mouth every 6 (six) hours as needed.   Taking  . aspirin EC 81 MG tablet Take 81 mg by mouth daily.   Taking  . atorvastatin (LIPITOR) 40 MG tablet Take 1 tablet (40 mg total) by mouth daily at 6 PM.     . etanercept (ENBREL) 50 MG/ML injection Inject 50 mg into the skin once a week. Saturday   Taking  . heparin 100-0.45 UNIT/ML-% infusion Inject 1,050 Units/hr into the vein continuous. 250 mL    . lisinopril (PRINIVIL,ZESTRIL) 5 MG tablet Take 1 tablet (5 mg total) by mouth daily.     . metoprolol tartrate (LOPRESSOR) 25 MG tablet  Take 1 tablet (25 mg total) by mouth 2 (two) times daily.     . Multiple Vitamins-Minerals (ONE-A-DAY MENS 50+ ADVANTAGE PO) Take 1 tablet by mouth daily.   Taking  . nitroGLYCERIN  (NITROSTAT) 0.4 MG SL tablet Place 1 tablet (0.4 mg total) under the tongue every 5 (five) minutes as needed for chest pain (hold for blood pressure less than 90).  12   . Omega-3 Fatty Acids (FISH OIL) 1200 MG CAPS Take 1 capsule by mouth 3 (three) times daily.   Taking  . vitamin B-12 (CYANOCOBALAMIN) 1000 MCG tablet Take 5,000 mcg by mouth daily.   Taking    Family History  Problem Relation Age of Onset  . Hypertension Mother   . Transient ischemic attack Mother   . CVA Mother   . Colon cancer Father   . Hypertension Father   . Prostate cancer Father   . Diabetes Brother      Review of Systems:      Cardiac Review of Systems: Y or N  Chest Pain [ y   ]  Resting SOB [ n  ] Exertional SOB  Blue.Reese  ]  Orthopnea [  ]   Pedal Edema [n   ]    Palpitations Florencio.Farrier  ] Syncope  [ n ]   Presyncope [   ]  General Review of Systems: [Y] = yes [  ]=no Constitional: recent weight change [  ]; anorexia [  ]; fatigue [n  ]; nausea [n  ]; night sweats [n  ]; fever [n  ]; or chills [  ]                                                               Dental: poor dentition[n  ]; Last Dentist visit:  Dentures  Eye : blurred vision [  ]; diplopia [   ]; vision changes [  ];  Amaurosis fugax[  ]; Resp: cough [  ];  wheezing[  ];  hemoptysis[  ]; shortness of breath[  ]; paroxysmal nocturnal dyspnea[  ]; dyspnea on exertion[ y ]; or orthopnea[  ];  GI:  gallstones[  ], vomiting[n  ];  dysphagia[  ]; melena[  ];  hematochezia [  ]; heartburn[  ];   Hx of  Colonoscopy[  ]; GU: kidney stones [  ]; hematuria[  ];   dysuria [  ];  nocturia[  ];  history of     obstruction [  ]; urinary frequency [  ]             Skin: rash, swelling[ n ];, hair loss[  ];  peripheral edema[  ];  or itching[  ]; Musculosketetal: myalgias[  ];  joint swelling[y  ];  joint erythema[  ];  joint pain[ y ];  back pain[  ];  Heme/Lymph: bruising[  ];  bleeding[  ];  anemia[  ];  Neuro: TIA[  ];  headaches[ n ];  stroke[  ];  vertigo[  ];   seizures[  ];   paresthesias[  ];  difficulty walking[  ];  Psych:depression[n  ]; anxiety[ n ];  Endocrine: diabetes[n  ];  thyroid dysfunction[  ];  Immunizations: Flu [  ]; Pneumococcal[  ];  Other: H/O RA on  Enbrel  Physical Exam:  Temp 97.7 F (36.5 C) (Oral)   General appearance: alert, cooperative and no distress Head: Normocephalic, without obvious abnormality, atraumatic Neck: no adenopathy, no carotid bruit, no JVD, supple, symmetrical, trachea midline and thyroid not enlarged, symmetric, no tenderness/mass/nodules Resp: clear to auscultation bilaterally Cardio: regular rate and rhythm GI: soft, non-tender; bowel sounds normal; no masses,  no organomegaly Extremities: edema mild left greater then right  present, Left Knee Replacement scar noted Neurologic: Grossly normal  Diagnostic Studies & Laboratory data:     Recent Radiology Findings:   Dg Chest 2 View  Result Date: 02/09/2017 CLINICAL DATA:  Left-sided chest pain and dyspnea, onset at 20:00 EXAM: CHEST  2 VIEW COMPARISON:  07/07/2006 FINDINGS: Diffuse interstitial coarsening, likely chronic. No confluent airspace consolidation. Emphysematous changes, upper lobe predominant. No effusion. Normal heart size. Hilar and mediastinal contours are unremarkable. IMPRESSION: Emphysematous changes and chronic appearing interstitial coarsening. No confluent consolidation. No effusion. Electronically Signed   By: Andreas Newport M.D.   On: 02/09/2017 01:32     I have independently reviewed the above radiologic studies.  Recent Lab Findings: Lab Results  Component Value Date   WBC 5.4 02/09/2017   HGB 13.5 02/09/2017   HCT 39.2 (L) 02/09/2017   PLT 138 (L) 02/09/2017   GLUCOSE 140 (H) 02/09/2017   CHOL 139 05/29/2016   TRIG 85 05/29/2016   HDL 42 05/29/2016   LDLCALC 80 05/29/2016   ALT 27 02/09/2017   AST 37 02/09/2017   NA 138 02/09/2017   K 3.4 (L) 02/09/2017   CL 108 02/09/2017   CREATININE 1.11 02/09/2017   BUN  16 02/09/2017   CO2 23 02/09/2017   TSH 2.560 11/25/2015   INR 1.03 02/09/2017   Cath: I have independently reviewed the   cath films and reviewed the findings with the  patient .  Conclusion     Ost LM lesion, 90 %stenosed.  Prox Cx lesion, 95 %stenosed.  Ost LAD lesion, 95 %stenosed.  Prox RCA lesion, 55 %stenosed.  Mid RCA lesion, 40 %stenosed.  Dist RCA lesion, 35 %stenosed.  There is severe left ventricular systolic dysfunction.  The left ventricular ejection fraction is 25-35% by visual estimate.   Left main 90%, 95% proximal lcx, 95% proximal lad, insignificant disease in rca. Moderate to severely reduced lv funciton with ef 25-25%  MR and AI  Will need consideration for cabg.    Procedural Details/Technique   Technical Details Procedural details: The right groin was prepped, draped, and anesthetized with 1% lidocaine. Using modified Seldinger technique, a 5 French sheath was introduced into the right femoral artery. Standard Judkins catheters were used for coronary angiography and left ventriculography. Catheter exchanges were performed over a guidewire. There were no immediate procedural complications. The patient was transferred to the post catheterization recovery area for further monitoring.  During this procedure the patient is administered a total of Versed 1 mg and Fentanyl 25 mcg to achieve and maintain moderate conscious sedation. The patient's heart rate, blood pressure, and oxygen saturation are monitored continuously during the procedure. The period of conscious sedation is 24 minutes, of which I was present face-to-face 100% of this time. Contrast 120, Radiation: 1281 mGy, 7.5 min Fluoro time   Estimated blood loss <50 mL.  During this procedure the patient was administered the following to achieve and maintain moderate conscious sedation: Versed 1 mg, Fentanyl 25 mcg, while the patient's heart rate, blood pressure, and oxygen saturation were  continuously monitored.  The period of conscious sedation was 24 minutes, of which I was present face-to-face 100% of this time.    Complications   Coronary Findings   Dominance: Right  Left Main  Ost LM lesion, 90% stenosed.  Left Anterior Descending  Ost LAD lesion, 95% stenosed.  Left Circumflex  Prox Cx lesion, 95% stenosed.  Right Coronary Artery  Prox RCA lesion, 55% stenosed.  Mid RCA lesion, 40% stenosed.  Dist RCA lesion, 35% stenosed.  Wall Motion              Left Heart   Left Ventricle The left ventricle is moderately dilated. There is severe left ventricular systolic dysfunction. The left ventricular ejection fraction is 25-35% by visual estimate. There are LV function abnormalities. There is moderate to severe mitral regurgitation.    Coronary Diagrams   Diagnostic Diagram        ECHO: Transthoracic Echocardiography  Patient:    Kimothy, Kishimoto MR #:       [id number] Study Date: 02/09/2017 Gender:     M Age:        62 Height:     180.3 cm Weight:     84.8 kg BSA:        2.07 m^2 Pt. Status: Room:       [place], Qing Md  SONOGRAPHER  Sonia Side Hege RDCS  PERFORMING   [facility]  ADMITTING    Hugelmeyer, Alexis  ORDERING     Hugelmeyer, Alexis  REFERRING    Hugelmeyer, Alexis  cc:  ------------------------------------------------------------------- LV EF: 20% -   25%  ------------------------------------------------------------------- Indications:      Chest pain 786.50.  ------------------------------------------------------------------- History:   PMH:  Arthritis.  Risk factors:  Hypertension.  ------------------------------------------------------------------- Study Conclusions  - Left ventricle: Systolic function was severely reduced. The   estimated ejection fraction was in the range of 20% to 25%.   Akinesis of the lateral myocardium. Hypokinesis of the   anteroseptal myocardium. Hypokinesis of the  anterior myocardium.   Hypokinesis of the apical myocardium. - Aortic valve: There was mild regurgitation. Valve area (Vmax):   2.03 cm^2. - Mitral valve: There was mild to moderate regurgitation. - Right ventricle: The cavity size was mildly dilated. Systolic   function was mildly reduced.  ------------------------------------------------------------------- Study data:   Study status:  Routine.  Procedure:  The patient reported no pain pre or post test.          Transthoracic echocardiography.  M-mode, complete 2D, spectral Doppler, and color Doppler.  Birthdate:  Patient birthdate: 28-Nov-1945.  Age:  Patient is 71 yr old.  Sex:  Gender: male.    BMI: 26.1 kg/m^2.  Blood pressure:     138/92  Patient status:  Inpatient.  Study date: Study date: 02/09/2017. Study time: 11:43 AM.  Location:  Bedside.   -------------------------------------------------------------------  ------------------------------------------------------------------- Left ventricle:  Systolic function was severely reduced. The estimated ejection fraction was in the range of 20% to 25%. Regional wall motion abnormalities:   Akinesis of the lateral myocardium.  Hypokinesis of the anteroseptal myocardium. Hypokinesis of the anterior myocardium.  Hypokinesis of the apical myocardium.  ------------------------------------------------------------------- Aortic valve:   Trileaflet.  Doppler:  There was mild regurgitation.    Peak velocity ratio of LVOT to aortic valve: 0.59. Valve area (Vmax): 2.03 cm^2. Indexed valve area (Vmax): 0.98 cm^2/m^2.    Peak gradient (S): 3 mm Hg.  ------------------------------------------------------------------- Mitral valve:   Doppler:  There was mild to  moderate regurgitation.    Peak gradient (D): 3 mm Hg.  ------------------------------------------------------------------- Left atrium:  The atrium was normal in  size.  ------------------------------------------------------------------- Right ventricle:  The cavity size was mildly dilated. Systolic function was mildly reduced.  ------------------------------------------------------------------- Tricuspid valve:   Doppler:  There was mild regurgitation.  ------------------------------------------------------------------- Right atrium:  The atrium was normal in size.  ------------------------------------------------------------------- Measurements   Left ventricle                            Value          Reference  LV ID, ED, PLAX chordal           (H)     54.5  mm       43 - 52  LV ID, ES, PLAX chordal           (H)     51.7  mm       23 - 38  LV fx shortening, PLAX chordal    (L)     5     %        >=29  LV PW thickness, ED                       14.2  mm       ---------  IVS/LV PW ratio, ED                       0.71           <=1.3  LV e&', lateral                            3.7   cm/s     ---------  LV E/e&', lateral                          21.38          ---------  LV e&', medial                             2.94  cm/s     ---------  LV E/e&', medial                           26.9           ---------  LV e&', average                            3.32  cm/s     ---------  LV E/e&', average                          23.83          ---------    Ventricular septum                        Value          Reference  IVS thickness, ED                         10.1  mm       ---------    LVOT  Value          Reference  LVOT ID, S                                21    mm       ---------  LVOT area                                 3.46  cm^2     ---------  LVOT peak velocity, S                     50.9  cm/s     ---------    Aortic valve                              Value          Reference  Aortic valve peak velocity, S             86.9  cm/s     ---------  Aortic peak gradient, S                   3     mm Hg     ---------  Velocity ratio, peak, LVOT/AV             0.59           ---------  Aortic valve area, peak velocity          2.03  cm^2     ---------  Aortic valve area/bsa, peak               0.98  cm^2/m^2 ---------  velocity    Aorta                                     Value          Reference  Aortic root ID, ED                        30    mm       ---------    Left atrium                               Value          Reference  LA ID, A-P, ES                            35    mm       ---------  LA ID/bsa, A-P                            1.69  cm/m^2   <=2.2    Mitral valve                              Value          Reference  Mitral E-wave peak velocity               79.1  cm/s     ---------  Mitral A-wave peak velocity               40.7  cm/s     ---------  Mitral deceleration time                  179   ms       150 - 230  Mitral peak gradient, D                   3     mm Hg    ---------  Mitral E/A ratio, peak                    1.9            ---------    Right ventricle                           Value          Reference  RV ID, ED, PLAX                           29.3  mm       19 - 38  TAPSE                                     23.8  mm       ---------    Pulmonic valve                            Value          Reference  Pulmonic valve peak velocity, S           56.8  cm/s     ---------  Legend: (L)  and  (H)  mark values outside specified reference range.  ------------------------------------------------------------------- Prepared and Electronically Authenticated by  [name], MD 2018-04-03T13:25:09  Assessment / Plan:   Severe two vessel and left main  CAD, S/P NSTEMI... With severe lv dysfunction 20% EF no previous history of MI- LV dysfunction COPD  With review of cath films echo and seeing patient , he has compensated lv dysfunction with critical coronary anatomy, discussed right heart cath with HF, with his compensated state  by symptoms  And signs  all agree that proceeding with cabg is best option . Will keep on heparin and proceed in am.  The goals risks and alternatives of the planned surgical procedure  CABG poss Mitral valve repair or replacement   have been discussed with the patient and family  in detail. The risks of the procedure including death, infection, stroke, myocardial infarction, bleeding, blood transfusion have all been discussed specifically.  I have quoted Rodell Perna a 4 % of perioperative mortality and a complication rate as high as 40%. The patient's questions have been answered.Jahzier Villalon is willing  to proceed with the planned procedure.   I  spent 40 minutes counseling the patient face to face and 50% or more the  time was spent in counseling and coordination of care. The total time spent in the appointment was 60 minutes.   [name] MD      [address] [place] [address] Office [phone]   Beeper [phone]  02/09/2017  6:23 PM     Patient ID: Sparsh Callens, male   DOB: Jun 26, 1946, 71 y.o.   MRN: [id number]

## 2017-02-09 NOTE — Progress Notes (Signed)
Pt. tx back to room, on monitor with RN escort. Stable for tx. Right groin clean, dry, intactl;

## 2017-02-09 NOTE — Progress Notes (Signed)
error 

## 2017-02-09 NOTE — Anesthesia Preprocedure Evaluation (Addendum)
Anesthesia Evaluation  Patient identified by MRN, date of birth, ID band Patient awake    Reviewed: Allergy & Precautions, NPO status , Patient's Chart, lab work & pertinent test results  Airway Mallampati: II  TM Distance: >3 FB     Dental  (+) Upper Dentures, Lower Dentures   Pulmonary COPD, former smoker,    breath sounds clear to auscultation       Cardiovascular hypertension, Pt. on medications and Pt. on home beta blockers + CAD and + Past MI  + Valvular Problems/Murmurs MR  Rhythm:Regular Rate:Normal     Neuro/Psych    GI/Hepatic   Endo/Other    Renal/GU      Musculoskeletal  (+) Arthritis ,   Abdominal   Peds  Hematology   Anesthesia Other Findings   Reproductive/Obstetrics                           Anesthesia Physical  Anesthesia Plan  ASA: IV  Anesthesia Plan: General   Post-op Pain Management:    Induction: Intravenous  Airway Management Planned: Oral ETT  Additional Equipment: TEE, Arterial line, 3D TEE, CVP, PA Cath and Ultrasound Guidance Line Placement  Intra-op Plan:   Post-operative Plan: Post-operative intubation/ventilation  Informed Consent: I have reviewed the patients History and Physical, chart, labs and discussed the procedure including the risks, benefits and alternatives for the proposed anesthesia with the patient or authorized representative who has indicated his/her understanding and acceptance.   Dental advisory given  Plan Discussed with: CRNA  Anesthesia Plan Comments:        Anesthesia Quick Evaluation

## 2017-02-09 NOTE — Progress Notes (Signed)
ANTICOAGULATION CONSULT NOTE - Initial Consult  Pharmacy Consult for heparin Indication: chest pain/ACS  No Known Allergies  Patient Measurements:   Heparin Dosing Weight: 84.8kg  Vital Signs: Temp: 97.7 F (36.5 C) (04/03 1500) Temp Source: Oral (04/03 1500) BP: 145/72 (04/03 1228) Pulse Rate: 76 (04/03 1228)  Labs:  Recent Labs  02/09/17 0037 02/09/17 0340 02/09/17 0644  HGB 13.5  --   --   HCT 39.2*  --   --   PLT 138*  --   --   APTT  --   --  34  LABPROT  --   --  13.5  INR  --   --  1.03  CREATININE 1.11  --   --   TROPONINI 0.05* 0.54* 1.50*    Estimated Creatinine Clearance: 66 mL/min (by C-G formula based on SCr of 1.11 mg/dL).   Assessment: 89 YOM transferred from [facility] s/p LHC with plans for RHC vs CABG tomorrow. To start heparin 8h after sheath removal. Sheath out at 1053 this morning. Hgb 13.5, plts 138- no excessive bleeding noted post-cath.  Patient had heparin ordered at [facility], but it was never started. Received subQ heparin this morning ~0600.  Goal of Therapy:  Heparin level 0.3-0.7 units/ml Monitor platelets by anticoagulation protocol: Yes   Plan:  -start heparin at 1050 units/hr at 1900 tonight -first heparin level 6h after start -daily heparin level and CBC -follow cardiology and CVTS plans  Kentravious Lipford D. Joselinne Lawal, PharmD, BCPS Clinical Pharmacist Pager: [phone] 02/09/2017 6:19 PM

## 2017-02-09 NOTE — ED Notes (Signed)
Pt returned from radiology.

## 2017-02-09 NOTE — Progress Notes (Signed)
[place]  SUBJECTIVE: no further chest pain. Left main equivalent cad. EF 30-35%. Will need consideration for cabg   Vitals:   02/09/17 0403 02/09/17 0721 02/09/17 0956 02/09/17 1021  BP: 139/72 (!) 147/69 (!) 152/81   Pulse: 80 74 82   Resp: 18 16 20    Temp: 97.5 F (36.4 C) 98.2 F (36.8 C) 98.2 F (36.8 C)   TempSrc: Oral Oral Oral   SpO2: 96% 93% 96% 97%  Weight: 85.1 kg (187 lb 11.2 oz)  84.8 kg (187 lb)   Height: 5\' 11"  (1.803 m)  5\' 11"  (1.803 m)     Intake/Output Summary (Last 24 hours) at 02/09/17 1057 Last data filed at 02/09/17 0734  Gross per 24 hour  Intake                0 ml  Output              400 ml  Net             -400 ml    LABS: Basic Metabolic Panel:  Recent Labs  02/09/17 0037  NA 138  K 3.4*  CL 108  CO2 23  GLUCOSE 140*  BUN 16  CREATININE 1.11  CALCIUM 9.4   Liver Function Tests:  Recent Labs  02/09/17 0037  AST 37  ALT 27  ALKPHOS 66  BILITOT 1.0  PROT 7.4  ALBUMIN 3.8   No results for input(s): LIPASE, AMYLASE in the last 72 hours. CBC:  Recent Labs  02/09/17 0037  WBC 5.4  NEUTROABS 3.5  HGB 13.5  HCT 39.2*  MCV 94.9  PLT 138*   Cardiac Enzymes:  Recent Labs  02/09/17 0037 02/09/17 0340 02/09/17 0644  TROPONINI 0.05* 0.54* 1.50*   BNP: Invalid input(s): POCBNP D-Dimer: No results for input(s): DDIMER in the last 72 hours. Hemoglobin A1C: No results for input(s): HGBA1C in the last 72 hours. Fasting Lipid Panel: No results for input(s): CHOL, HDL, LDLCALC, TRIG, CHOLHDL, LDLDIRECT in the last 72 hours. Thyroid Function Tests: No results for input(s): TSH, T4TOTAL, T3FREE, THYROIDAB in the last 72 hours.  Invalid input(s): FREET3 Anemia Panel: No results for input(s): VITAMINB12, FOLATE, FERRITIN, TIBC, IRON, RETICCTPCT in the last 72 hours.   Physical Exam: Blood pressure (!) 152/81, pulse 82, temperature 98.2 F (36.8 C), temperature source Oral, resp.  rate 20, height 5\' 11"  (1.803 m), weight 84.8 kg (187 lb), SpO2 97 %.   Wt Readings from Last 1 Encounters:  02/09/17 84.8 kg (187 lb)     General appearance: alert and cooperative Resp: clear to auscultation bilaterally Cardio: regular rate and rhythm GI: soft, non-tender; bowel sounds normal; no masses,  no organomegaly Extremities: extremities normal, atraumatic, no cyanosis or edema Neurologic: Grossly normal  TELEMETRY: Reviewed telemetry pt in nsr  ASSESSMENT AND PLAN:  Active Problems:   NSTEMI-pt with nstemi. Cath revealed very proximial left circumflex and lad. Insignificant disease in rca. EF moderate to severely reduced with ef 30-35%. Will  Need consideration for cabg. Will change beta blocker to carvedilol 6.25 mg bid, continue with lisinopril and high intensity statin. Will place on heparin without bolus. Phase 2 cardiac rehab. Will discuss with [facility] cardiovascular surgery per patient.    [name], MD, [facility] 02/09/2017 10:57 AM

## 2017-02-09 NOTE — Progress Notes (Signed)
*  PRELIMINARY RESULTS* Echocardiogram 2D Echocardiogram has been performed.  Sherrie Sport 02/09/2017, 12:03 PM

## 2017-02-09 NOTE — Progress Notes (Signed)
Date and time results received: 02/09/17 0605   Test: troponin Critical Value: 0.54  Name of Provider Notified: Dr. Harvie Bridge  Orders Received? Or Actions Taken?: heparin protocol orders to be put in.

## 2017-02-09 NOTE — H&P (Signed)
ADVANCED HEART FAILURE H&P   Cardiac Surgery: Dr Servando Snare   Seth Byrd is a 71 year old AA male with a history of HTN, RA,prostate CA, hyperlipidemia, and former smoker. No cardiac history prior to admit.      Chief Complaint: Chest Pain/NSTEMI  HPI: Over the last few weeks he has not been SOB or had CP. He has been able to work at [employer]. Last night he complained of indigestion that was intermittent initially but progressed to constant discomfort he continued to describe as indigestion. He later developed left arm pain with diaphoresis. He called his daughter and asked her to bring him to the ED. In the ED he received 325 mg aspirin and nitroglycerin that improved discomfort. In the ED he was hypertensive. Pertinent labs include; Troponin 0.05>0.54, 1.5. Cardiology was consulted for NSTEMI. Today he underwent LHC as noted below with evidence of 3 vessel disease so Cardiac Surgery was consulted.  Dr Servando Snare evaluated and he requested transfer to [facility] and for Heart Failure Team to admit. ECHO was completed earlier today with LVEF 20-25% with mild-mod Seth. Presently he denies N/V and chest pain.   LHC 02/09/2017  Ost LM lesion, 90 %stenosed.  Prox Cx lesion, 95 %stenosed.  Ost LAD lesion, 95 %stenosed.  Prox RCA lesion, 55 %stenosed.  Mid RCA lesion, 40 %stenosed.  Dist RCA lesion, 35 %stenosed.  There is severe left ventricular systolic dysfunction.  The left ventricular ejection fraction is 25-35% by visual estimate.  Left main 90%, 95% proximal lcx, 95% proximal lad, insignificant disease in rca. Moderate to severely reduced lv funciton with ef 25-25%  ECHO 02/09/2017  Left ventricle: Systolic function was severely reduced. The   estimated ejection fraction was in the range of 20% to 25%.   Akinesis of the lateral myocardium. Hypokinesis of the   anteroseptal myocardium. Hypokinesis of the anterior myocardium.   Hypokinesis of the apical myocardium. - Aortic valve: There  was mild regurgitation. Valve area (Vmax):   2.03 cm^2. - Mitral valve: There was mild to moderate regurgitation. - Right ventricle: The cavity size was mildly dilated. Systolic   function was mildly reduced.  Past Medical History:  Diagnosis Date  . Arthritis   . Hypertension     Past Surgical History:  Procedure Laterality Date  . BACK SURGERY     Spinal Fusion with 2 screws  . JOINT REPLACEMENT    . TOTAL KNEE ARTHROPLASTY Left 04/23/2015   Procedure: TOTAL KNEE ARTHROPLASTY;  Surgeon: Christophe Louis, MD;  Location: [facility] ORS;  Service: Orthopedics;  Laterality: Left;    Family History  Problem Relation Age of Onset  . Hypertension Mother   . Transient ischemic attack Mother   . CVA Mother   . Colon cancer Father   . Hypertension Father   . Prostate cancer Father   . Diabetes Brother    Social History:  reports that he quit smoking about 11 years ago. His smoking use included Cigarettes. He smoked 1.50 packs per day. He has never used smokeless tobacco. He reports that he does not drink alcohol or use drugs. Lives alone. Works at [employer].   Allergies: No Known Allergies  Medications Prior to Admission  Medication Sig Dispense Refill  . acetaminophen (TYLENOL) 500 MG tablet Take 500 mg by mouth every 6 (six) hours as needed.    Marland Kitchen aspirin EC 81 MG tablet Take 81 mg by mouth daily.    Marland Kitchen atorvastatin (LIPITOR) 40 MG tablet Take 1  tablet (40 mg total) by mouth daily at 6 PM.    . etanercept (ENBREL) 50 MG/ML injection Inject 50 mg into the skin once a week. Saturday    . heparin 100-0.45 UNIT/ML-% infusion Inject 1,050 Units/hr into the vein continuous. 250 mL   . lisinopril (PRINIVIL,ZESTRIL) 5 MG tablet Take 1 tablet (5 mg total) by mouth daily.    . metoprolol tartrate (LOPRESSOR) 25 MG tablet Take 1 tablet (25 mg total) by mouth 2 (two) times daily.    . Multiple Vitamins-Minerals (ONE-A-DAY MENS 50+ ADVANTAGE PO) Take 1 tablet by mouth daily.    . nitroGLYCERIN  (NITROSTAT) 0.4 MG SL tablet Place 1 tablet (0.4 mg total) under the tongue every 5 (five) minutes as needed for chest pain (hold for blood pressure less than 90).  12  . Omega-3 Fatty Acids (FISH OIL) 1200 MG CAPS Take 1 capsule by mouth 3 (three) times daily.    . vitamin B-12 (CYANOCOBALAMIN) 1000 MCG tablet Take 5,000 mcg by mouth daily.      Results for orders placed or performed during the hospital encounter of 02/09/17 (from the past 48 hour(s))  Comprehensive metabolic panel     Status: Abnormal   Collection Time: 02/09/17 12:37 AM  Result Value Ref Range   Sodium 138 135 - 145 mmol/L   Potassium 3.4 (L) 3.5 - 5.1 mmol/L   Chloride 108 101 - 111 mmol/L   CO2 23 22 - 32 mmol/L   Glucose, Bld 140 (H) 65 - 99 mg/dL   BUN 16 6 - 20 mg/dL   Creatinine, Ser 1.11 0.61 - 1.24 mg/dL   Calcium 9.4 8.9 - 10.3 mg/dL   Total Protein 7.4 6.5 - 8.1 g/dL   Albumin 3.8 3.5 - 5.0 g/dL   AST 37 15 - 41 U/L   ALT 27 17 - 63 U/L   Alkaline Phosphatase 66 38 - 126 U/L   Total Bilirubin 1.0 0.3 - 1.2 mg/dL   GFR calc non Af Amer >60 >60 mL/min   GFR calc Af Amer >60 >60 mL/min    Comment: (NOTE) The eGFR has been calculated using the CKD EPI equation. This calculation has not been validated in all clinical situations. eGFR's persistently <60 mL/min signify possible Chronic Kidney Disease.    Anion gap 7 5 - 15  CBC with Differential     Status: Abnormal   Collection Time: 02/09/17 12:37 AM  Result Value Ref Range   WBC 5.4 3.8 - 10.6 K/uL   RBC 4.13 (L) 4.40 - 5.90 MIL/uL   Hemoglobin 13.5 13.0 - 18.0 g/dL   HCT 39.2 (L) 40.0 - 52.0 %   MCV 94.9 80.0 - 100.0 fL   MCH 32.6 26.0 - 34.0 pg   MCHC 34.4 32.0 - 36.0 g/dL   RDW 12.7 11.5 - 14.5 %   Platelets 138 (L) 150 - 440 K/uL   Neutrophils Relative % 65 %   Neutro Abs 3.5 1.4 - 6.5 K/uL   Lymphocytes Relative 24 %   Lymphs Abs 1.3 1.0 - 3.6 K/uL   Monocytes Relative 8 %   Monocytes Absolute 0.4 0.2 - 1.0 K/uL   Eosinophils Relative 2  %   Eosinophils Absolute 0.1 0 - 0.7 K/uL   Basophils Relative 1 %   Basophils Absolute 0.0 0 - 0.1 K/uL  Troponin I     Status: Abnormal   Collection Time: 02/09/17 12:37 AM  Result Value Ref Range   Troponin I 0.05 (  HH) <0.03 ng/mL    Comment: CRITICAL RESULT CALLED TO, READ BACK BY AND VERIFIED WITH KENNY PATEL ON 02/09/17 AT 0132 BY TLB   Troponin I-serum (0, 3, 6 hours)     Status: Abnormal   Collection Time: 02/09/17  3:40 AM  Result Value Ref Range   Troponin I 0.54 (HH) <0.03 ng/mL    Comment: CRITICAL RESULT CALLED TO, READ BACK BY AND VERIFIED WITH LEXI MILLER ON 02/09/17 AT 0445 BY TLB   APTT     Status: None   Collection Time: 02/09/17  6:44 AM  Result Value Ref Range   aPTT 34 24 - 36 seconds  Protime-INR     Status: None   Collection Time: 02/09/17  6:44 AM  Result Value Ref Range   Prothrombin Time 13.5 11.4 - 15.2 seconds   INR 1.03   Troponin I     Status: Abnormal   Collection Time: 02/09/17  6:44 AM  Result Value Ref Range   Troponin I 1.50 (HH) <0.03 ng/mL    Comment: CRITICAL VALUE NOTED. VALUE IS CONSISTENT WITH PREVIOUSLY REPORTED/CALLED VALUE...[facility]   Dg Chest 2 View  Result Date: 02/09/2017 CLINICAL DATA:  Left-sided chest pain and dyspnea, onset at 20:00 EXAM: CHEST  2 VIEW COMPARISON:  07/07/2006 FINDINGS: Diffuse interstitial coarsening, likely chronic. No confluent airspace consolidation. Emphysematous changes, upper lobe predominant. No effusion. Normal heart size. Hilar and mediastinal contours are unremarkable. IMPRESSION: Emphysematous changes and chronic appearing interstitial coarsening. No confluent consolidation. No effusion. Electronically Signed   By: Andreas Newport M.D.   On: 02/09/2017 01:32    Review of Systems  Constitutional: Positive for diaphoresis.  HENT: Negative.   Eyes: Negative.   Respiratory: Positive for shortness of breath.   Cardiovascular: Positive for chest pain.  Gastrointestinal: Positive for nausea.  Genitourinary:  Negative.   Musculoskeletal: Positive for back pain and joint pain.  Skin: Negative.   Neurological: Negative.   Endo/Heme/Allergies: Negative.   Psychiatric/Behavioral: Negative.     Temperature 97.7 F (36.5 C), temperature source Oral. Physical Exam  Constitutional: He is oriented to person, place, and time. He appears well-developed and well-nourished.  HENT:  Head: Normocephalic and atraumatic.  Eyes: EOM are normal. Pupils are equal, round, and reactive to light.  Neck: Normal range of motion. Neck supple.  Cardiovascular: Normal rate, regular rhythm, normal heart sounds and intact distal pulses.   JVP 5-6  Respiratory: Effort normal and breath sounds normal.  GI: Soft. Bowel sounds are normal.  Musculoskeletal: Normal range of motion.  Warm. No lower extremity edema.   Neurological: He is alert and oriented to person, place, and time.  Skin: Skin is warm and dry.  Psychiatric: He has a normal mood and affect. His behavior is normal.     Assessment/Plan Seth Byrd is a 71 year old admitted from [facility] for HF tune up prior to CABG later this week. He appears stable without evidence of shock. Plan for RHC tomorrow afternoon.   1. NSTEMI/ CAD- Multivessel.  Had [place] with muti-vessel disease. Heparin per pharmacy. Start aspirin and statin.  CT surgery following for possible CABG later this week.   2. Acute Systolic Heart Failure - new reduced EF noted ECHO today with EF 20-25%. ICM.   EF 20-25%. On exam he is warm and dry. Start 3.125 mg coreg twice a day and lisinopril 5 mg daily. Follow renal function closely.  3. Hyperlipidemia- On statin as above 4. HTN- Stable. Starting bb and ace 5.  Arthritis- on enbrel weekly.   Darrick Grinder, NP 02/09/2017, 4:43 PM  Patient seen and examined with Darrick Grinder, NP. We discussed all aspects of the encounter. I agree with the assessment and plan as stated above.   I have reviewed cath films and echo personally.   71 yo with h/o HTN, HL and  previous tobacco use (quit 15 year ago) admitted with NSTEMI. Cath shows critical CAD with very high-grade proximal LAD and LCX disease. EF on echo 20-25% with mild to mod Seth. On exam he is comfortable. Necv veins not elevated. No s3. Extremities are warm and well-perfused. Given low EF initial plan was for RHC. However after reviewing cath films and evaluating patient, I think revascularization is relatively urgent and no clear reason for patient to have pulmonary HTN. I have discussed with Dr. Servando Snare and plan will be for CABG first case in am. Restart heparin. If develops CP overnight will need IV NTG +/- IABP.  Glori Bickers, MD  6:23 PM

## 2017-02-10 ENCOUNTER — Inpatient Hospital Stay (HOSPITAL_COMMUNITY): Payer: 59

## 2017-02-10 ENCOUNTER — Encounter (HOSPITAL_COMMUNITY)
Admission: AD | Disposition: A | Discharge status: Skilled Nursing Facility | Payer: Self-pay | Source: Other Acute Inpatient Hospital | Attending: Cardiothoracic Surgery

## 2017-02-10 ENCOUNTER — Inpatient Hospital Stay (HOSPITAL_COMMUNITY): Payer: 59 | Admitting: Anesthesiology

## 2017-02-10 ENCOUNTER — Encounter: Payer: Self-pay | Admitting: Cardiology

## 2017-02-10 ENCOUNTER — Inpatient Hospital Stay (HOSPITAL_COMMUNITY)
Admission: AD | Disposition: A | Discharge status: Skilled Nursing Facility | Payer: Self-pay | Source: Other Acute Inpatient Hospital | Attending: Cardiothoracic Surgery

## 2017-02-10 DIAGNOSIS — I25110 Atherosclerotic heart disease of native coronary artery with unstable angina pectoris: Secondary | ICD-10-CM

## 2017-02-10 DIAGNOSIS — Z951 Presence of aortocoronary bypass graft: Secondary | ICD-10-CM

## 2017-02-10 HISTORY — PX: CORONARY ARTERY BYPASS GRAFT: SHX141

## 2017-02-10 HISTORY — PX: TEE WITHOUT CARDIOVERSION: SHX5443

## 2017-02-10 LAB — POCT I-STAT, CHEM 8
BUN: 11 mg/dL (ref 6–20)
BUN: 10 mg/dL (ref 6–20)
BUN: 7 mg/dL (ref 6–20)
BUN: 10 mg/dL (ref 6–20)
CALCIUM ION: 1.26 mmol/L (ref 1.15–1.40)
CHLORIDE: 105 mmol/L (ref 101–111)
CHLORIDE: 106 mmol/L (ref 101–111)
CHLORIDE: 105 mmol/L (ref 101–111)
CREATININE: 0.8 mg/dL (ref 0.61–1.24)
Calcium, Ion: 1.26 mmol/L (ref 1.15–1.40)
Calcium, Ion: 0.89 mmol/L — CL (ref 1.15–1.40)
Calcium, Ion: 1.34 mmol/L (ref 1.15–1.40)
Chloride: 98 mmol/L — ABNORMAL LOW (ref 101–111)
Creatinine, Ser: 0.7 mg/dL (ref 0.61–1.24)
Creatinine, Ser: 0.7 mg/dL (ref 0.61–1.24)
Creatinine, Ser: 0.4 mg/dL — ABNORMAL LOW (ref 0.61–1.24)
GLUCOSE: 102 mg/dL — AB (ref 65–99)
GLUCOSE: 139 mg/dL — AB (ref 65–99)
Glucose, Bld: 97 mg/dL (ref 65–99)
Glucose, Bld: 78 mg/dL (ref 65–99)
HCT: 36 % — ABNORMAL LOW (ref 39.0–52.0)
HCT: 31 % — ABNORMAL LOW (ref 39.0–52.0)
HEMATOCRIT: 25 % — AB (ref 39.0–52.0)
HEMATOCRIT: 24 % — AB (ref 39.0–52.0)
HEMOGLOBIN: 10.5 g/dL — AB (ref 13.0–17.0)
HEMOGLOBIN: 8.5 g/dL — AB (ref 13.0–17.0)
HEMOGLOBIN: 8.2 g/dL — AB (ref 13.0–17.0)
Hemoglobin: 12.2 g/dL — ABNORMAL LOW (ref 13.0–17.0)
POTASSIUM: 4.1 mmol/L (ref 3.5–5.1)
POTASSIUM: 4.2 mmol/L (ref 3.5–5.1)
Potassium: 4.2 mmol/L (ref 3.5–5.1)
Potassium: 3.9 mmol/L (ref 3.5–5.1)
SODIUM: 139 mmol/L (ref 135–145)
SODIUM: 139 mmol/L (ref 135–145)
Sodium: 138 mmol/L (ref 135–145)
Sodium: 138 mmol/L (ref 135–145)
TCO2: 28 mmol/L (ref 0–100)
TCO2: 26 mmol/L (ref 0–100)
TCO2: 26 mmol/L (ref 0–100)
TCO2: 23 mmol/L (ref 0–100)

## 2017-02-10 LAB — GLUCOSE, CAPILLARY
GLUCOSE-CAPILLARY: 104 mg/dL — AB (ref 65–99)
GLUCOSE-CAPILLARY: 107 mg/dL — AB (ref 65–99)
Glucose-Capillary: 105 mg/dL — ABNORMAL HIGH (ref 65–99)
Glucose-Capillary: 106 mg/dL — ABNORMAL HIGH (ref 65–99)

## 2017-02-10 LAB — CBC WITH DIFFERENTIAL/PLATELET
BASOS ABS: 0 10*3/uL (ref 0.0–0.1)
BASOS PCT: 0 %
Eosinophils Absolute: 0.2 10*3/uL (ref 0.0–0.7)
Eosinophils Relative: 3 %
HEMATOCRIT: 37.5 % — AB (ref 39.0–52.0)
HEMOGLOBIN: 12.9 g/dL — AB (ref 13.0–17.0)
LYMPHS PCT: 41 %
Lymphs Abs: 2.3 10*3/uL (ref 0.7–4.0)
MCH: 32.5 pg (ref 26.0–34.0)
MCHC: 34.4 g/dL (ref 30.0–36.0)
MCV: 94.5 fL (ref 78.0–100.0)
MONO ABS: 0.5 10*3/uL (ref 0.1–1.0)
MONOS PCT: 9 %
NEUTROS ABS: 2.6 10*3/uL (ref 1.7–7.7)
NEUTROS PCT: 47 %
Platelets: 121 10*3/uL — ABNORMAL LOW (ref 150–400)
RBC: 3.97 MIL/uL — ABNORMAL LOW (ref 4.22–5.81)
RDW: 12.5 % (ref 11.5–15.5)
WBC: 5.5 10*3/uL (ref 4.0–10.5)

## 2017-02-10 LAB — POCT I-STAT 3, ART BLOOD GAS (G3+)
ACID-BASE DEFICIT: 4 mmol/L — AB (ref 0.0–2.0)
Acid-base deficit: 4 mmol/L — ABNORMAL HIGH (ref 0.0–2.0)
Acid-base deficit: 3 mmol/L — ABNORMAL HIGH (ref 0.0–2.0)
BICARBONATE: 25.3 mmol/L (ref 20.0–28.0)
BICARBONATE: 24.8 mmol/L (ref 20.0–28.0)
BICARBONATE: 21.4 mmol/L (ref 20.0–28.0)
Bicarbonate: 22.7 mmol/L (ref 20.0–28.0)
Bicarbonate: 20.7 mmol/L (ref 20.0–28.0)
O2 SAT: 89 %
O2 SAT: 97 %
O2 Saturation: 100 %
O2 Saturation: 100 %
O2 Saturation: 99 %
PCO2 ART: 40.2 mmHg (ref 32.0–48.0)
PCO2 ART: 35.6 mmHg (ref 32.0–48.0)
PH ART: 7.389 (ref 7.350–7.450)
PH ART: 7.402 (ref 7.350–7.450)
PH ART: 7.319 — AB (ref 7.350–7.450)
PH ART: 7.37 (ref 7.350–7.450)
PO2 ART: 401 mmHg — AB (ref 83.0–108.0)
PO2 ART: 125 mmHg — AB (ref 83.0–108.0)
Patient temperature: 36.3
Patient temperature: 36.4
TCO2: 27 mmol/L (ref 0–100)
TCO2: 26 mmol/L (ref 0–100)
TCO2: 23 mmol/L (ref 0–100)
TCO2: 24 mmol/L (ref 0–100)
TCO2: 22 mmol/L (ref 0–100)
pCO2 arterial: 42 mmHg (ref 32.0–48.0)
pCO2 arterial: 39.9 mmHg (ref 32.0–48.0)
pCO2 arterial: 41.5 mmHg (ref 32.0–48.0)
pH, Arterial: 7.356 (ref 7.350–7.450)
pO2, Arterial: 339 mmHg — ABNORMAL HIGH (ref 83.0–108.0)
pO2, Arterial: 57 mmHg — ABNORMAL LOW (ref 83.0–108.0)
pO2, Arterial: 87 mmHg (ref 83.0–108.0)

## 2017-02-10 LAB — POCT I-STAT 4, (NA,K, GLUC, HGB,HCT)
GLUCOSE: 107 mg/dL — AB (ref 65–99)
HEMATOCRIT: 30 % — AB (ref 39.0–52.0)
HEMOGLOBIN: 10.2 g/dL — AB (ref 13.0–17.0)
Potassium: 4.3 mmol/L (ref 3.5–5.1)
Sodium: 140 mmol/L (ref 135–145)

## 2017-02-10 LAB — SPIROMETRY WITH GRAPH
FEF 25-75 Pre: 1.42 L/sec
FEF2575-%Pred-Pre: 55 %
FEV1-%Pred-Pre: 64 %
FEV1-Pre: 1.94 L
FEV1FVC-%Pred-Pre: 93 %
FEV6-%Pred-Pre: 70 %
FEV6-Pre: 2.68 L
FEV6FVC-%Pred-Pre: 104 %
FVC-%Pred-Pre: 68 %
FVC-Pre: 2.72 L
Pre FEV1/FVC ratio: 71 %
Pre FEV6/FVC Ratio: 99 %

## 2017-02-10 LAB — CBC
HCT: 33.6 % — ABNORMAL LOW (ref 39.0–52.0)
HCT: 28.7 % — ABNORMAL LOW (ref 39.0–52.0)
HEMOGLOBIN: 11.1 g/dL — AB (ref 13.0–17.0)
Hemoglobin: 9.5 g/dL — ABNORMAL LOW (ref 13.0–17.0)
MCH: 31.5 pg (ref 26.0–34.0)
MCH: 31.5 pg (ref 26.0–34.0)
MCHC: 33 g/dL (ref 30.0–36.0)
MCHC: 33.1 g/dL (ref 30.0–36.0)
MCV: 95.5 fL (ref 78.0–100.0)
MCV: 95 fL (ref 78.0–100.0)
Platelets: 107 10*3/uL — ABNORMAL LOW (ref 150–400)
Platelets: 92 10*3/uL — ABNORMAL LOW (ref 150–400)
RBC: 3.52 MIL/uL — AB (ref 4.22–5.81)
RBC: 3.02 MIL/uL — ABNORMAL LOW (ref 4.22–5.81)
RDW: 12.4 % (ref 11.5–15.5)
RDW: 12.2 % (ref 11.5–15.5)
WBC: 14.1 10*3/uL — AB (ref 4.0–10.5)
WBC: 8.4 10*3/uL (ref 4.0–10.5)

## 2017-02-10 LAB — BASIC METABOLIC PANEL
ANION GAP: 8 (ref 5–15)
BUN: 12 mg/dL (ref 6–20)
CO2: 24 mmol/L (ref 22–32)
Calcium: 9.1 mg/dL (ref 8.9–10.3)
Chloride: 105 mmol/L (ref 101–111)
Creatinine, Ser: 0.86 mg/dL (ref 0.61–1.24)
GFR calc non Af Amer: >60 mL/min (ref >60)
GLUCOSE: 101 mg/dL — AB (ref 65–99)
POTASSIUM: 3.4 mmol/L — AB (ref 3.5–5.1)
Sodium: 137 mmol/L (ref 135–145)

## 2017-02-10 LAB — PROTIME-INR
INR: 1.51
Prothrombin Time: 18.4 seconds — ABNORMAL HIGH (ref 11.4–15.2)

## 2017-02-10 LAB — BLOOD GAS, ARTERIAL
Acid-base deficit: 0.6 mmol/L (ref 0.0–2.0)
Bicarbonate: 22.9 mmol/L (ref 20.0–28.0)
Drawn by: 418751
FIO2: 21
O2 Saturation: 94.5 %
Patient temperature: 98.6
pCO2 arterial: 33.1 mmHg (ref 32.0–48.0)
pH, Arterial: 7.455 — ABNORMAL HIGH (ref 7.350–7.450)
pO2, Arterial: 73.2 mmHg — ABNORMAL LOW (ref 83.0–108.0)

## 2017-02-10 LAB — CREATININE, SERUM
Creatinine, Ser: 0.86 mg/dL (ref 0.61–1.24)
GFR calc Af Amer: >60 mL/min (ref >60)
GFR calc non Af Amer: >60 mL/min (ref >60)

## 2017-02-10 LAB — COOXEMETRY PANEL
CARBOXYHEMOGLOBIN: 1.6 % — AB (ref 0.5–1.5)
METHEMOGLOBIN: 1 % (ref 0.0–1.5)
O2 SAT: 74.1 %
TOTAL HEMOGLOBIN: 11.3 g/dL — AB (ref 12.0–16.0)

## 2017-02-10 LAB — HEMOGLOBIN AND HEMATOCRIT, BLOOD
HCT: 27.8 % — ABNORMAL LOW (ref 39.0–52.0)
Hemoglobin: 9.6 g/dL — ABNORMAL LOW (ref 13.0–17.0)

## 2017-02-10 LAB — PREPARE RBC (CROSSMATCH)

## 2017-02-10 LAB — PLATELET COUNT: Platelets: 108 10*3/uL — ABNORMAL LOW (ref 150–400)

## 2017-02-10 LAB — APTT: aPTT: 29 seconds (ref 24–36)

## 2017-02-10 LAB — MAGNESIUM
Magnesium: 1.7 mg/dL (ref 1.7–2.4)
Magnesium: 2.5 mg/dL — ABNORMAL HIGH (ref 1.7–2.4)

## 2017-02-10 LAB — HEPARIN LEVEL (UNFRACTIONATED): HEPARIN UNFRACTIONATED: 0.18 [IU]/mL — AB (ref 0.30–0.70)

## 2017-02-10 SURGERY — RIGHT HEART CATH
Anesthesia: LOCAL

## 2017-02-10 SURGERY — CORONARY ARTERY BYPASS GRAFTING (CABG)
Anesthesia: General | Site: Chest

## 2017-02-10 MED ORDER — SODIUM CHLORIDE 0.45 % IV SOLN
INTRAVENOUS | Status: DC | PRN
  Administered 2017-02-10: 16:00:00 via INTRAVENOUS

## 2017-02-10 MED ORDER — MORPHINE SULFATE (PF) 4 MG/ML IV SOLN
2.0000 mg | INTRAVENOUS | Status: DC | PRN
  Administered 2017-02-11 (×3): 4 mg via INTRAVENOUS
  Filled 2017-02-10 (×4): qty 1

## 2017-02-10 MED ORDER — SODIUM CHLORIDE 0.9 % IV SOLN
0.0000 ug/kg/h | INTRAVENOUS | Status: DC
  Administered 2017-02-10: 0.5 ug/kg/h via INTRAVENOUS
  Filled 2017-02-10: qty 2

## 2017-02-10 MED ORDER — MILRINONE LACTATE IN DEXTROSE 20-5 MG/100ML-% IV SOLN
0.3000 ug/kg/min | INTRAVENOUS | Status: DC
  Administered 2017-02-10: 0.2 ug/kg/min via INTRAVENOUS
  Filled 2017-02-10: qty 100

## 2017-02-10 MED ORDER — SODIUM CHLORIDE 0.9 % IV SOLN
INTRAVENOUS | Status: DC
  Administered 2017-02-10: 16:00:00 via INTRAVENOUS

## 2017-02-10 MED ORDER — VANCOMYCIN HCL IN DEXTROSE 1-5 GM/200ML-% IV SOLN
1000.0000 mg | Freq: Once | INTRAVENOUS | Status: AC
  Administered 2017-02-10: 1000 mg via INTRAVENOUS
  Filled 2017-02-10: qty 200

## 2017-02-10 MED ORDER — TRAMADOL HCL 50 MG PO TABS
50.0000 mg | ORAL_TABLET | ORAL | Status: DC | PRN
  Administered 2017-02-11: 100 mg via ORAL
  Filled 2017-02-10: qty 2

## 2017-02-10 MED ORDER — ROCURONIUM BROMIDE 50 MG/5ML IV SOSY
PREFILLED_SYRINGE | INTRAVENOUS | Status: AC
  Filled 2017-02-10: qty 10

## 2017-02-10 MED ORDER — ACETAMINOPHEN 160 MG/5ML PO SOLN: 650.0000 mg | Freq: Once | ORAL | Status: AC

## 2017-02-10 MED ORDER — CHLORHEXIDINE GLUCONATE 0.12% ORAL RINSE (MEDLINE KIT)
15.0000 mL | Freq: Two times a day (BID) | OROMUCOSAL | Status: DC
  Administered 2017-02-10: 15 mL via OROMUCOSAL

## 2017-02-10 MED ORDER — INSULIN ASPART 100 UNIT/ML ~~LOC~~ SOLN: 0.0000 [IU] | SUBCUTANEOUS | Status: DC

## 2017-02-10 MED ORDER — LIDOCAINE 2% (20 MG/ML) 5 ML SYRINGE
INTRAMUSCULAR | Status: AC
  Filled 2017-02-10: qty 5

## 2017-02-10 MED ORDER — ALBUMIN HUMAN 5 % IV SOLN
250.0000 mL | INTRAVENOUS | Status: AC | PRN
  Administered 2017-02-10 – 2017-02-11 (×4): 250 mL via INTRAVENOUS
  Filled 2017-02-10 (×2): qty 250

## 2017-02-10 MED ORDER — MIDAZOLAM HCL 2 MG/2ML IJ SOLN
2.0000 mg | INTRAMUSCULAR | Status: DC | PRN
  Filled 2017-02-10: qty 2

## 2017-02-10 MED ORDER — MORPHINE SULFATE (PF) 2 MG/ML IV SOLN: 1.0000 mg | INTRAVENOUS | Status: DC | PRN

## 2017-02-10 MED ORDER — ATORVASTATIN CALCIUM 40 MG PO TABS
40.0000 mg | ORAL_TABLET | Freq: Every day | ORAL | Status: DC
  Administered 2017-02-11 – 2017-02-18 (×8): 40 mg via ORAL
  Filled 2017-02-10 (×8): qty 1

## 2017-02-10 MED ORDER — MORPHINE SULFATE (PF) 4 MG/ML IV SOLN
1.0000 mg | INTRAVENOUS | Status: DC | PRN
  Administered 2017-02-12: 2 mg via INTRAVENOUS

## 2017-02-10 MED ORDER — MAGNESIUM SULFATE 4 GM/100ML IV SOLN
4.0000 g | Freq: Once | INTRAVENOUS | Status: AC
  Administered 2017-02-10: 4 g via INTRAVENOUS
  Filled 2017-02-10: qty 100

## 2017-02-10 MED ORDER — MILRINONE LACTATE IN DEXTROSE 20-5 MG/100ML-% IV SOLN
0.1250 ug/kg/min | INTRAVENOUS | Status: AC
  Administered 2017-02-10: .2 ug/kg/min via INTRAVENOUS
  Filled 2017-02-10: qty 100

## 2017-02-10 MED ORDER — ACETAMINOPHEN 160 MG/5ML PO SOLN: 1000.0000 mg | Freq: Four times a day (QID) | ORAL | Status: AC

## 2017-02-10 MED ORDER — DOCUSATE SODIUM 100 MG PO CAPS
200.0000 mg | ORAL_CAPSULE | Freq: Every day | ORAL | Status: DC
  Administered 2017-02-11 – 2017-02-16 (×6): 200 mg via ORAL
  Filled 2017-02-10 (×6): qty 2

## 2017-02-10 MED ORDER — ACETAMINOPHEN 500 MG PO TABS
1000.0000 mg | ORAL_TABLET | Freq: Four times a day (QID) | ORAL | Status: AC
  Administered 2017-02-11 – 2017-02-15 (×20): 1000 mg via ORAL
  Filled 2017-02-10 (×19): qty 2

## 2017-02-10 MED ORDER — PANTOPRAZOLE SODIUM 40 MG PO TBEC
40.0000 mg | DELAYED_RELEASE_TABLET | Freq: Every day | ORAL | Status: DC
  Administered 2017-02-12 – 2017-02-16 (×5): 40 mg via ORAL
  Filled 2017-02-10 (×5): qty 1

## 2017-02-10 MED ORDER — BISACODYL 10 MG RE SUPP: 10.0000 mg | Freq: Every day | RECTAL | Status: DC

## 2017-02-10 MED ORDER — SODIUM CHLORIDE 0.9 % IV SOLN
INTRAVENOUS | Status: DC
  Filled 2017-02-10: qty 2.5

## 2017-02-10 MED ORDER — ROCURONIUM BROMIDE 50 MG/5ML IV SOSY
PREFILLED_SYRINGE | INTRAVENOUS | Status: AC
  Filled 2017-02-10: qty 5

## 2017-02-10 MED ORDER — METOPROLOL TARTRATE 25 MG/10 ML ORAL SUSPENSION: 12.5000 mg | Freq: Two times a day (BID) | ORAL | Status: DC

## 2017-02-10 MED ORDER — HEPARIN SODIUM (PORCINE) 1000 UNIT/ML IJ SOLN
INTRAMUSCULAR | Status: AC
  Filled 2017-02-10: qty 1

## 2017-02-10 MED ORDER — METOCLOPRAMIDE HCL 5 MG/ML IJ SOLN
10.0000 mg | Freq: Four times a day (QID) | INTRAMUSCULAR | Status: AC
  Administered 2017-02-10 – 2017-02-15 (×20): 10 mg via INTRAVENOUS
  Filled 2017-02-10 (×18): qty 2

## 2017-02-10 MED ORDER — ORAL CARE MOUTH RINSE
15.0000 mL | OROMUCOSAL | Status: DC
  Administered 2017-02-10: 15 mL via OROMUCOSAL

## 2017-02-10 MED ORDER — FENTANYL CITRATE (PF) 250 MCG/5ML IJ SOLN
INTRAMUSCULAR | Status: AC
  Filled 2017-02-10: qty 25

## 2017-02-10 MED ORDER — LACTATED RINGERS IV SOLN
INTRAVENOUS | Status: DC
  Administered 2017-02-10: 20 mL/h via INTRAVENOUS

## 2017-02-10 MED ORDER — ASPIRIN 81 MG PO CHEW: 324.0000 mg | CHEWABLE_TABLET | Freq: Every day | ORAL | Status: DC

## 2017-02-10 MED ORDER — PHENYLEPHRINE HCL 10 MG/ML IJ SOLN
INTRAMUSCULAR | Status: AC
  Filled 2017-02-10: qty 1

## 2017-02-10 MED ORDER — FENTANYL CITRATE (PF) 250 MCG/5ML IJ SOLN
INTRAMUSCULAR | Status: AC
  Filled 2017-02-10: qty 5

## 2017-02-10 MED ORDER — VECURONIUM BROMIDE 10 MG IV SOLR
INTRAVENOUS | Status: DC | PRN
  Administered 2017-02-10 (×2): 5 mg via INTRAVENOUS

## 2017-02-10 MED ORDER — SODIUM CHLORIDE 0.9 % IJ SOLN
OROMUCOSAL | Status: DC | PRN
  Administered 2017-02-10 (×3): 4 mL via TOPICAL

## 2017-02-10 MED ORDER — SODIUM CHLORIDE 0.9% FLUSH
3.0000 mL | Freq: Two times a day (BID) | INTRAVENOUS | Status: DC
  Administered 2017-02-11 – 2017-02-15 (×7): 3 mL via INTRAVENOUS

## 2017-02-10 MED ORDER — PHENYLEPHRINE 40 MCG/ML (10ML) SYRINGE FOR IV PUSH (FOR BLOOD PRESSURE SUPPORT)
PREFILLED_SYRINGE | INTRAVENOUS | Status: AC
  Filled 2017-02-10: qty 30

## 2017-02-10 MED ORDER — MIDAZOLAM HCL 5 MG/5ML IJ SOLN
INTRAMUSCULAR | Status: DC | PRN
  Administered 2017-02-10: 4 mg via INTRAVENOUS
  Administered 2017-02-10: 2 mg via INTRAVENOUS
  Administered 2017-02-10: 4 mg via INTRAVENOUS

## 2017-02-10 MED ORDER — METOPROLOL TARTRATE 12.5 MG HALF TABLET
12.5000 mg | ORAL_TABLET | Freq: Two times a day (BID) | ORAL | Status: DC
  Administered 2017-02-12: 12.5 mg via ORAL
  Filled 2017-02-10: qty 1

## 2017-02-10 MED ORDER — SODIUM CHLORIDE 0.9 % IV SOLN
30.0000 meq | Freq: Once | INTRAVENOUS | Status: DC
  Filled 2017-02-10: qty 15

## 2017-02-10 MED ORDER — HEPARIN SODIUM (PORCINE) 1000 UNIT/ML IJ SOLN
INTRAMUSCULAR | Status: DC | PRN
  Administered 2017-02-10: 34000 [IU] via INTRAVENOUS
  Administered 2017-02-10: 10000 [IU] via INTRAVENOUS

## 2017-02-10 MED ORDER — PROTAMINE SULFATE 10 MG/ML IV SOLN
INTRAVENOUS | Status: AC
  Filled 2017-02-10: qty 25

## 2017-02-10 MED ORDER — MIDAZOLAM HCL 10 MG/2ML IJ SOLN
INTRAMUSCULAR | Status: AC
  Filled 2017-02-10: qty 2

## 2017-02-10 MED ORDER — EPHEDRINE SULFATE 50 MG/ML IJ SOLN
INTRAMUSCULAR | Status: DC | PRN
  Administered 2017-02-10 (×2): 5 mg via INTRAVENOUS

## 2017-02-10 MED ORDER — BISACODYL 5 MG PO TBEC
10.0000 mg | DELAYED_RELEASE_TABLET | Freq: Every day | ORAL | Status: DC
  Administered 2017-02-11 – 2017-02-16 (×6): 10 mg via ORAL
  Filled 2017-02-10 (×6): qty 2

## 2017-02-10 MED ORDER — FENTANYL CITRATE (PF) 250 MCG/5ML IJ SOLN
INTRAMUSCULAR | Status: DC | PRN
  Administered 2017-02-10: 250 ug via INTRAVENOUS
  Administered 2017-02-10: 100 ug via INTRAVENOUS
  Administered 2017-02-10: 250 ug via INTRAVENOUS
  Administered 2017-02-10: 350 ug via INTRAVENOUS
  Administered 2017-02-10: 250 ug via INTRAVENOUS
  Administered 2017-02-10: 50 ug via INTRAVENOUS

## 2017-02-10 MED ORDER — ROCURONIUM BROMIDE 10 MG/ML (PF) SYRINGE
PREFILLED_SYRINGE | INTRAVENOUS | Status: DC | PRN
  Administered 2017-02-10: 50 mg via INTRAVENOUS
  Administered 2017-02-10: 30 mg via INTRAVENOUS
  Administered 2017-02-10: 50 mg via INTRAVENOUS
  Administered 2017-02-10: 20 mg via INTRAVENOUS

## 2017-02-10 MED ORDER — CHLORHEXIDINE GLUCONATE 0.12 % MT SOLN
15.0000 mL | OROMUCOSAL | Status: AC
  Administered 2017-02-10: 15 mL via OROMUCOSAL

## 2017-02-10 MED ORDER — DEXTROSE 5 % IV SOLN
1.5000 g | Freq: Two times a day (BID) | INTRAVENOUS | Status: AC
  Administered 2017-02-11 – 2017-02-12 (×3): 1.5 g via INTRAVENOUS
  Filled 2017-02-10 (×4): qty 1.5

## 2017-02-10 MED ORDER — FAMOTIDINE IN NACL 20-0.9 MG/50ML-% IV SOLN
20.0000 mg | Freq: Two times a day (BID) | INTRAVENOUS | Status: AC
  Administered 2017-02-10 (×2): 20 mg via INTRAVENOUS
  Filled 2017-02-10: qty 50

## 2017-02-10 MED ORDER — ASPIRIN EC 325 MG PO TBEC
325.0000 mg | DELAYED_RELEASE_TABLET | Freq: Every day | ORAL | Status: DC
  Administered 2017-02-11 – 2017-02-16 (×6): 325 mg via ORAL
  Filled 2017-02-10 (×6): qty 1

## 2017-02-10 MED ORDER — DOPAMINE-DEXTROSE 3.2-5 MG/ML-% IV SOLN: 3.0000 ug/kg/min | INTRAVENOUS | Status: AC

## 2017-02-10 MED ORDER — EPHEDRINE 5 MG/ML INJ
INTRAVENOUS | Status: AC
  Filled 2017-02-10: qty 30

## 2017-02-10 MED ORDER — ORAL CARE MOUTH RINSE
15.0000 mL | Freq: Two times a day (BID) | OROMUCOSAL | Status: DC
  Administered 2017-02-11 – 2017-02-14 (×4): 15 mL via OROMUCOSAL

## 2017-02-10 MED ORDER — LIDOCAINE 2% (20 MG/ML) 5 ML SYRINGE
INTRAMUSCULAR | Status: DC | PRN
  Administered 2017-02-10: 100 mg via INTRAVENOUS
  Administered 2017-02-10: 50 mg via INTRAVENOUS

## 2017-02-10 MED ORDER — INSULIN REGULAR BOLUS VIA INFUSION
0.0000 [IU] | Freq: Three times a day (TID) | INTRAVENOUS | Status: DC
  Filled 2017-02-10: qty 10

## 2017-02-10 MED ORDER — LACTATED RINGERS IV SOLN
INTRAVENOUS | Status: DC
  Administered 2017-02-10 – 2017-02-13 (×3): via INTRAVENOUS

## 2017-02-10 MED ORDER — NITROGLYCERIN IN D5W 200-5 MCG/ML-% IV SOLN: 0.0000 ug/min | INTRAVENOUS | Status: DC

## 2017-02-10 MED ORDER — SODIUM CHLORIDE 0.9 % IV SOLN: 250.0000 mL | INTRAVENOUS | Status: DC

## 2017-02-10 MED ORDER — SODIUM CHLORIDE 0.9% FLUSH: 3.0000 mL | INTRAVENOUS | Status: DC | PRN

## 2017-02-10 MED ORDER — ACETAMINOPHEN 650 MG RE SUPP
650.0000 mg | Freq: Once | RECTAL | Status: AC
  Administered 2017-02-10: 650 mg via RECTAL

## 2017-02-10 MED ORDER — METOPROLOL TARTRATE 5 MG/5ML IV SOLN: 2.5000 mg | INTRAVENOUS | Status: DC | PRN

## 2017-02-10 MED ORDER — PROPOFOL 10 MG/ML IV BOLUS
INTRAVENOUS | Status: AC
  Filled 2017-02-10: qty 20

## 2017-02-10 MED ORDER — HEMOSTATIC AGENTS (NO CHARGE) OPTIME
TOPICAL | Status: DC | PRN
  Administered 2017-02-10 (×4): 1 via TOPICAL

## 2017-02-10 MED ORDER — VECURONIUM BROMIDE 10 MG IV SOLR
INTRAVENOUS | Status: AC
  Filled 2017-02-10: qty 10

## 2017-02-10 MED ORDER — 0.9 % SODIUM CHLORIDE (POUR BTL) OPTIME
TOPICAL | Status: DC | PRN
  Administered 2017-02-10: 6000 mL

## 2017-02-10 MED ORDER — ROCURONIUM BROMIDE 100 MG/10ML IV SOLN: INTRAVENOUS | Status: DC | PRN

## 2017-02-10 MED ORDER — ONDANSETRON HCL 4 MG/2ML IJ SOLN: 4.0000 mg | Freq: Four times a day (QID) | INTRAMUSCULAR | Status: DC | PRN

## 2017-02-10 MED ORDER — OXYCODONE HCL 5 MG PO TABS
5.0000 mg | ORAL_TABLET | ORAL | Status: DC | PRN
  Administered 2017-02-11 – 2017-02-12 (×4): 10 mg via ORAL
  Filled 2017-02-10 (×4): qty 2

## 2017-02-10 MED ORDER — ETOMIDATE 2 MG/ML IV SOLN
INTRAVENOUS | Status: DC | PRN
  Administered 2017-02-10: 8 mg via INTRAVENOUS

## 2017-02-10 MED ORDER — PROTAMINE SULFATE 10 MG/ML IV SOLN
INTRAVENOUS | Status: AC
  Filled 2017-02-10: qty 10

## 2017-02-10 MED ORDER — PHENYLEPHRINE HCL 10 MG/ML IJ SOLN
INTRAMUSCULAR | Status: DC | PRN
  Administered 2017-02-10: 40 ug via INTRAVENOUS

## 2017-02-10 MED ORDER — ALBUMIN HUMAN 5 % IV SOLN
INTRAVENOUS | Status: DC | PRN
  Administered 2017-02-10: 15:00:00 via INTRAVENOUS

## 2017-02-10 MED ORDER — PROTAMINE SULFATE 10 MG/ML IV SOLN
INTRAVENOUS | Status: DC | PRN
  Administered 2017-02-10: 10 mg via INTRAVENOUS
  Administered 2017-02-10 (×3): 50 mg via INTRAVENOUS
  Administered 2017-02-10: 30 mg via INTRAVENOUS
  Administered 2017-02-10 (×3): 50 mg via INTRAVENOUS

## 2017-02-10 MED ORDER — PHENYLEPHRINE 40 MCG/ML (10ML) SYRINGE FOR IV PUSH (FOR BLOOD PRESSURE SUPPORT)
PREFILLED_SYRINGE | INTRAVENOUS | Status: AC
  Filled 2017-02-10: qty 10

## 2017-02-10 MED ORDER — SODIUM CHLORIDE 0.9 % IV SOLN
0.0000 ug/min | INTRAVENOUS | Status: DC
  Administered 2017-02-10 (×2): 90 ug/min via INTRAVENOUS
  Filled 2017-02-10 (×3): qty 2

## 2017-02-10 MED ORDER — MORPHINE SULFATE (PF) 2 MG/ML IV SOLN: 2.0000 mg | INTRAVENOUS | Status: DC | PRN

## 2017-02-10 MED ORDER — LACTATED RINGERS IV SOLN: 500.0000 mL | Freq: Once | INTRAVENOUS | Status: DC | PRN

## 2017-02-10 MED ORDER — LACTATED RINGERS IV SOLN
INTRAVENOUS | Status: DC | PRN
  Administered 2017-02-10 (×5): via INTRAVENOUS

## 2017-02-10 MED ORDER — ETOMIDATE 2 MG/ML IV SOLN
INTRAVENOUS | Status: AC
  Filled 2017-02-10: qty 10

## 2017-02-10 MED FILL — Potassium Chloride Inj 2 mEq/ML: INTRAVENOUS | Qty: 40 | Status: AC

## 2017-02-10 MED FILL — Magnesium Sulfate Inj 50%: INTRAMUSCULAR | Qty: 10 | Status: AC

## 2017-02-10 MED FILL — Heparin Sodium (Porcine) Inj 1000 Unit/ML: INTRAMUSCULAR | Qty: 30 | Status: AC

## 2017-02-10 SURGICAL SUPPLY — 122 items
ADAPTER CARDIO PERF ANTE/RETRO (ADAPTER) ×4 IMPLANT
ADPR PRFSN 84XANTGRD RTRGD (ADAPTER) ×4
AGENT HMST KT MTR STRL THRMB (HEMOSTASIS) ×2
BAG DECANTER FOR FLEXI CONT (MISCELLANEOUS) ×3 IMPLANT
BANDAGE ACE 4X5 VEL STRL LF (GAUZE/BANDAGES/DRESSINGS) ×4 IMPLANT
BANDAGE ACE 6X5 VEL STRL LF (GAUZE/BANDAGES/DRESSINGS) ×4 IMPLANT
BLADE STERNUM SYSTEM 6 (BLADE) ×3 IMPLANT
BLADE SURG 11 STRL SS (BLADE) ×1 IMPLANT
BLADE SURG 15 STRL LF DISP TIS (BLADE) ×2 IMPLANT
BLADE SURG 15 STRL SS (BLADE) ×3
BNDG GAUZE ELAST 4 BULKY (GAUZE/BANDAGES/DRESSINGS) ×4 IMPLANT
CANISTER SUCT 3000ML PPV (MISCELLANEOUS) ×3 IMPLANT
CANN PRFSN .5XCNCT 15X34-48 (MISCELLANEOUS) ×2
CANNULA ARTERIAL NVNT 3/8 22FR (MISCELLANEOUS) IMPLANT
CANNULA GUNDRY RCSP 15FR (MISCELLANEOUS) IMPLANT
CANNULA PRFSN .5XCNCT 15X34-48 (MISCELLANEOUS) ×2 IMPLANT
CANNULA VEN 2 STAGE (MISCELLANEOUS) ×3
CANNULA VESSEL 3MM 2 BLNT TIP (CANNULA) ×1 IMPLANT
CATH CPB KIT GERHARDT (MISCELLANEOUS) ×3 IMPLANT
CATH HEART VENT LEFT (CATHETERS) ×2 IMPLANT
CATH ROBINSON RED A/P 18FR (CATHETERS) IMPLANT
CATH THORACIC 28FR (CATHETERS) ×3 IMPLANT
CLIP FOGARTY SPRING 6M (CLIP) IMPLANT
CONN 1/2X1/2X1/2  BEN (MISCELLANEOUS) ×1
CONN 1/2X1/2X1/2 BEN (MISCELLANEOUS) ×2 IMPLANT
CONN 3/8X1/2 ST GISH (MISCELLANEOUS) ×6 IMPLANT
CONN Y 3/8X3/8X3/8  BEN (MISCELLANEOUS)
CONN Y 3/8X3/8X3/8 BEN (MISCELLANEOUS) IMPLANT
CRADLE DONUT ADULT HEAD (MISCELLANEOUS) ×3 IMPLANT
DRAIN CHANNEL 28F RND 3/8 FF (WOUND CARE) ×3 IMPLANT
DRAPE CARDIOVASCULAR INCISE (DRAPES) ×3
DRAPE SLUSH/WARMER DISC (DRAPES) ×3 IMPLANT
DRAPE SRG 135X102X78XABS (DRAPES) ×2 IMPLANT
DRSG AQUACEL AG ADV 3.5X14 (GAUZE/BANDAGES/DRESSINGS) ×7 IMPLANT
ELECT BLADE 4.0 EZ CLEAN MEGAD (MISCELLANEOUS) ×3
ELECT CAUTERY BLADE 6.4 (BLADE) IMPLANT
ELECT REM PT RETURN 9FT ADLT (ELECTROSURGICAL) ×6
ELECTRODE BLDE 4.0 EZ CLN MEGD (MISCELLANEOUS) ×2 IMPLANT
ELECTRODE REM PT RTRN 9FT ADLT (ELECTROSURGICAL) ×4 IMPLANT
FELT TEFLON 1X6 (MISCELLANEOUS) ×6 IMPLANT
GAUZE SPONGE 4X4 12PLY STRL (GAUZE/BANDAGES/DRESSINGS) ×7 IMPLANT
GLOVE BIO SURGEON STRL SZ 6 (GLOVE) IMPLANT
GLOVE BIO SURGEON STRL SZ 6.5 (GLOVE) ×9 IMPLANT
GLOVE BIO SURGEON STRL SZ7 (GLOVE) IMPLANT
GOWN STRL REUS W/ TWL LRG LVL3 (GOWN DISPOSABLE) ×8 IMPLANT
GOWN STRL REUS W/TWL LRG LVL3 (GOWN DISPOSABLE) ×12
HEMOSTAT POWDER SURGIFOAM 1G (HEMOSTASIS) ×9 IMPLANT
HEMOSTAT SURGICEL 2X14 (HEMOSTASIS) ×5 IMPLANT
INSERT FOGARTY XLG (MISCELLANEOUS) IMPLANT
KIT BASIN OR (CUSTOM PROCEDURE TRAY) ×3 IMPLANT
KIT CATH SUCT 8FR (CATHETERS) ×3 IMPLANT
KIT ROOM TURNOVER OR (KITS) ×3 IMPLANT
KIT SUCTION CATH 14FR (SUCTIONS) ×6 IMPLANT
KIT VASOVIEW HEMOPRO VH 3000 (KITS) ×3 IMPLANT
LEAD PACING MYOCARDI (MISCELLANEOUS) ×3 IMPLANT
LINE VENT (MISCELLANEOUS) ×1 IMPLANT
MARKER GRAFT CORONARY BYPASS (MISCELLANEOUS) ×9 IMPLANT
NS IRRIG 1000ML POUR BTL (IV SOLUTION) ×15 IMPLANT
PACK OPEN HEART (CUSTOM PROCEDURE TRAY) ×3 IMPLANT
PAD ARMBOARD 7.5X6 YLW CONV (MISCELLANEOUS) ×6 IMPLANT
PAD ELECT DEFIB RADIOL ZOLL (MISCELLANEOUS) ×3 IMPLANT
PENCIL BUTTON HOLSTER BLD 10FT (ELECTRODE) ×3 IMPLANT
PUNCH AORTIC ROT 4.0MM RCL 40 (MISCELLANEOUS) ×1 IMPLANT
SET CARDIOPLEGIA MPS 5001102 (MISCELLANEOUS) ×1 IMPLANT
SOLUTION ANTI FOG 6CC (MISCELLANEOUS) ×1 IMPLANT
SPONGE LAP 18X18 X RAY DECT (DISPOSABLE) ×5 IMPLANT
STOPCOCK 4 WAY LG BORE MALE ST (IV SETS) ×1 IMPLANT
SUCKER WEIGHTED FLEX (MISCELLANEOUS) ×3 IMPLANT
SURGIFLO W/THROMBIN 8M KIT (HEMOSTASIS) ×1 IMPLANT
SUT BONE WAX W31G (SUTURE) ×3 IMPLANT
SUT ETHIBOND 2 0 SH (SUTURE) ×18 IMPLANT
SUT ETHIBOND 2 0 SH 36X2 (SUTURE) ×8 IMPLANT
SUT ETHIBOND 2 0 V4 (SUTURE) IMPLANT
SUT ETHIBOND 2 0V4 GREEN (SUTURE) IMPLANT
SUT MNCRL AB 4-0 PS2 18 (SUTURE) ×3 IMPLANT
SUT PROLENE 3 0 RB 1 (SUTURE) ×2 IMPLANT
SUT PROLENE 3 0 SH 1 (SUTURE) ×3 IMPLANT
SUT PROLENE 3 0 SH1 36 (SUTURE) ×3 IMPLANT
SUT PROLENE 4 0 RB 1 (SUTURE) ×6
SUT PROLENE 4 0 TF (SUTURE) ×6 IMPLANT
SUT PROLENE 4-0 RB1 .5 CRCL 36 (SUTURE) ×4 IMPLANT
SUT PROLENE 5 0 C 1 36 (SUTURE) ×9 IMPLANT
SUT PROLENE 5 0 CC1 (SUTURE) IMPLANT
SUT PROLENE 6 0 C 1 30 (SUTURE) ×7 IMPLANT
SUT PROLENE 6 0 CC (SUTURE) ×7 IMPLANT
SUT PROLENE 7 0 BV1 MDA (SUTURE) ×4 IMPLANT
SUT PROLENE 8 0 BV175 6 (SUTURE) ×2 IMPLANT
SUT SILK  1 MH (SUTURE) ×4
SUT SILK 1 MH (SUTURE) ×4 IMPLANT
SUT SILK 1 TIES 10X30 (SUTURE) ×4 IMPLANT
SUT SILK 2 0 (SUTURE) ×3
SUT SILK 2 0 SH CR/8 (SUTURE) ×9 IMPLANT
SUT SILK 2 0 TIES 10X30 (SUTURE) ×1 IMPLANT
SUT SILK 2 0 TIES 17X18 (SUTURE) ×3
SUT SILK 2-0 18XBRD TIE 12 (SUTURE) ×2 IMPLANT
SUT SILK 2-0 18XBRD TIE BLK (SUTURE) IMPLANT
SUT SILK 3 0 SH CR/8 (SUTURE) ×3 IMPLANT
SUT SILK 4 0 (SUTURE) ×3
SUT SILK 4 0 TIE 10X30 (SUTURE) ×2 IMPLANT
SUT SILK 4-0 18XBRD TIE 12 (SUTURE) ×2 IMPLANT
SUT STEEL 6MS V (SUTURE) ×3 IMPLANT
SUT STEEL SZ 6 DBL 3X14 BALL (SUTURE) ×3 IMPLANT
SUT TEM PAC WIRE 2 0 SH (SUTURE) ×12 IMPLANT
SUT VIC AB 1 CTX 18 (SUTURE) ×6 IMPLANT
SUT VIC AB 2-0 CT1 27 (SUTURE) ×9
SUT VIC AB 2-0 CT1 TAPERPNT 27 (SUTURE) IMPLANT
SUT VIC AB 2-0 CTX 27 (SUTURE) ×2 IMPLANT
SUT VIC AB 3-0 X1 27 (SUTURE) ×2 IMPLANT
SUTURE E-PAK OPEN HEART (SUTURE) ×2 IMPLANT
SYSTEM SAHARA CHEST DRAIN ATS (WOUND CARE) ×3 IMPLANT
TOWEL GREEN STERILE (TOWEL DISPOSABLE) ×12 IMPLANT
TOWEL GREEN STERILE FF (TOWEL DISPOSABLE) ×6 IMPLANT
TOWEL OR 17X24 6PK STRL BLUE (TOWEL DISPOSABLE) ×6 IMPLANT
TOWEL OR 17X26 10 PK STRL BLUE (TOWEL DISPOSABLE) ×6 IMPLANT
TRAY CATH LUMEN 1 20CM STRL (SET/KITS/TRAYS/PACK) ×1 IMPLANT
TRAY FOLEY IC TEMP SENS 16FR (CATHETERS) ×3 IMPLANT
TUBE FEEDING 8FR 16IN STR KANG (MISCELLANEOUS) ×3 IMPLANT
TUBING ART PRESS 48 MALE/FEM (TUBING) ×2 IMPLANT
TUBING INSUFFLATION (TUBING) ×3 IMPLANT
UNDERPAD 30X30 (UNDERPADS AND DIAPERS) ×3 IMPLANT
VENT LEFT HEART 12002 (CATHETERS) ×6
WATER STERILE IRR 1000ML POUR (IV SOLUTION) ×6 IMPLANT

## 2017-02-10 NOTE — Progress Notes (Signed)
Rapid wean protocol initiated with RT. Will continue to closely monitor pt.  Sherlie Ban, RN

## 2017-02-10 NOTE — Progress Notes (Signed)
      [address]       [place] [address]             [phone]      s/p CABG x 3  Intubated and sedated  BP (!) 160/81   Pulse 89   Temp 97.5 F (36.4 C)   Resp (!) 24   Wt 188 lb 3.2 oz (85.4 kg)   SpO2 94%   BMI 26.25 kg/m  27/11 Ci= 2.3 Co-ox= 74  Intake/Output Summary (Last 24 hours) at 02/10/17 1808 Last data filed at 02/10/17 1700  Gross per 24 hour  Intake          4374.11 ml  Output             3800 ml  Net           574.11 ml   Hct= 33  Doing well early postop  Remo Lipps C. Roxan Hockey, MD [facility] [phone]

## 2017-02-10 NOTE — Progress Notes (Signed)
Pt vent settings changed by RT to CPAP/PS per rapid wean protocol. Will assess for mechanics and ABG in 20 minutes.  Sherlie Ban, RN

## 2017-02-10 NOTE — Progress Notes (Signed)
  Echocardiogram 2D Echocardiogram has been performed.  Seth Byrd 02/10/2017, 11:25 AM

## 2017-02-10 NOTE — Progress Notes (Signed)
Pre Procedure note for inpatients:   Abhijot Straughter has been scheduled for Procedure(s): CORONARY ARTERY BYPASS GRAFTING (CABG)/POSSIBLE BALLOON PUMP (N/A) TRANSESOPHAGEAL ECHOCARDIOGRAM (TEE) (N/A) MITRAL VALVE (MV) REPLACEMENT/POSSIBLE (N/A) today. The various methods of treatment have been discussed with the patient. After consideration of the risks, benefits and treatment options the patient has consented to the planned procedure.   The patient has been seen and labs reviewed. There are no changes in the patient's condition to prevent proceeding with the planned procedure today.  Recent labs:  Lab Results  Component Value Date   WBC 5.5 02/10/2017   HGB 12.9 (L) 02/10/2017   HCT 37.5 (L) 02/10/2017   PLT 121 (L) 02/10/2017   GLUCOSE 101 (H) 02/10/2017   CHOL 139 05/29/2016   TRIG 85 05/29/2016   HDL 42 05/29/2016   LDLCALC 80 05/29/2016   ALT 27 02/09/2017   AST 37 02/09/2017   NA 137 02/10/2017   K 3.4 (L) 02/10/2017   CL 105 02/10/2017   CREATININE 0.86 02/10/2017   BUN 12 02/10/2017   CO2 24 02/10/2017   TSH 2.560 11/25/2015   INR 1.03 02/09/2017    [name], MD 02/10/2017 6:51 AM

## 2017-02-10 NOTE — Progress Notes (Signed)
OR called; heparin stopped 0626.  Clyda Hurdle RN

## 2017-02-10 NOTE — Progress Notes (Signed)
RT note- Patient's ETT re secured, xray confirmed, Dr. Servando Snare called. Recruitment performed for spo2 88%.

## 2017-02-10 NOTE — Anesthesia Procedure Notes (Signed)
Procedure Name: Intubation Date/Time: 02/10/2017 8:50 AM Performed by: Valda Favia Pre-anesthesia Checklist: Patient identified, Emergency Drugs available, Suction available, Patient being monitored and Timeout performed Patient Re-evaluated:Patient Re-evaluated prior to inductionOxygen Delivery Method: Circle system utilized Preoxygenation: Pre-oxygenation with 100% oxygen Intubation Type: IV induction Ventilation: Mask ventilation without difficulty and Oral airway inserted - appropriate to patient size Laryngoscope Size: Mac and 4 Grade View: Grade I Tube type: Oral Tube size: 8.0 mm Number of attempts: 1 Airway Equipment and Method: Stylet Placement Confirmation: ETT inserted through vocal cords under direct vision,  positive ETCO2 and breath sounds checked- equal and bilateral Secured at: 21 cm Tube secured with: Tape Dental Injury: Teeth and Oropharynx as per pre-operative assessment

## 2017-02-10 NOTE — Brief Op Note (Addendum)
      [address]       [place] [address]             [phone]      02/10/2017  2:33 PM  PATIENT:  Seth Byrd  71 y.o. male  PRE-OPERATIVE DIAGNOSIS:  CORONARY ARTERY DISEASE with left main and severe  lv dysfunction and MR  POST-OPERATIVE DIAGNOSIS:  Same   PROCEDURE:  Procedure(s): CORONARY ARTERY BYPASS GRAFTING (CABG), ON PUMP, TIMES THREE, USING LEFT INTERNAL MAMMARY ARTERY AND ENDOSCOPICALLY HARVESTED BILATERAL GREATER SAPHENOUS VEINS (N/A) TRANSESOPHAGEAL ECHOCARDIOGRAM (TEE) (N/A)  Open harvest of left lower leg vein   LIMA to LAD SVG to Diag 1 SVG to OM1   SURGEON:  Surgeon(s) and Role:    * [name], MD - Primary  PHYSICIAN ASSISTANT:  Nicholes Rough, PA-C  ANESTHESIA:   general  EBL:  Total I/O In: 2800 [I.V.:2800] Out: 1000 [Urine:1000]  BLOOD ADMINISTERED:none  DRAINS: routine   LOCAL MEDICATIONS USED:  NONE  SPECIMEN:  No Specimen  DISPOSITION OF SPECIMEN:  N/A  COUNTS:  YES  DICTATION: .Dragon Dictation  PLAN OF CARE: Admit to inpatient   PATIENT DISPOSITION:  ICU - intubated and hemodynamically stable.   Delay start of Pharmacological VTE agent (>24hrs) due to surgical blood loss or risk of bleeding: yes

## 2017-02-10 NOTE — Plan of Care (Signed)
Problem: Education: Goal: Knowledge of disease or condition will improve Outcome: Progressing Watched video

## 2017-02-10 NOTE — Anesthesia Postprocedure Evaluation (Signed)
Anesthesia Post Note  Patient: Seth Byrd  Procedure(s) Performed: Procedure(s) (LRB): CORONARY ARTERY BYPASS GRAFTING (CABG), ON PUMP, TIMES THREE, USING LEFT INTERNAL MAMMARY ARTERY AND ENDOSCOPICALLY HARVESTED BILATERAL GREATER SAPHENOUS VEINS (N/A) TRANSESOPHAGEAL ECHOCARDIOGRAM (TEE) (N/A)  Patient location during evaluation: SICU Anesthesia Type: General Level of consciousness: sedated and patient remains intubated per anesthesia plan Pain management: pain level controlled Vital Signs Assessment: post-procedure vital signs reviewed and stable Respiratory status: spontaneous breathing and patient remains intubated per anesthesia plan Cardiovascular status: stable Anesthetic complications: no       Last Vitals:  Vitals:   02/10/17 1615 02/10/17 1623  BP:    Pulse: 89 89  Resp: (!) 23 (!) 24  Temp:  36.4 C    Last Pain:  Vitals:   02/10/17 0400  TempSrc: Oral  PainSc:                  Seth Byrd

## 2017-02-10 NOTE — Progress Notes (Signed)
RT note- fio2 increased due to low P02.

## 2017-02-10 NOTE — Transfer of Care (Signed)
Immediate Anesthesia Transfer of Care Note  Patient: Seth Byrd  Procedure(s) Performed: Procedure(s) with comments: CORONARY ARTERY BYPASS GRAFTING (CABG), ON PUMP, TIMES THREE, USING LEFT INTERNAL MAMMARY ARTERY AND ENDOSCOPICALLY HARVESTED BILATERAL GREATER SAPHENOUS VEINS (N/A) - LIMA to LAD SVG to Diag 1 SVG to OM1 TRANSESOPHAGEAL ECHOCARDIOGRAM (TEE) (N/A)  Patient Location: ICU  Anesthesia Type:General  Level of Consciousness: sedated and Patient remains intubated per anesthesia plan  Airway & Oxygen Therapy: Patient remains intubated per anesthesia plan and Patient placed on Ventilator (see vital sign flow sheet for setting)  Post-op Assessment: Report given to RN and Post -op Vital signs reviewed and stable  Post vital signs: Reviewed and stable  Last Vitals:  Vitals:   02/10/17 1615 02/10/17 1623  BP:    Pulse: 89 89  Resp: (!) 23 (!) 24  Temp:  36.4 C    Last Pain:  Vitals:   02/10/17 0400  TempSrc: Oral  PainSc:          Complications: No apparent anesthesia complications

## 2017-02-10 NOTE — Progress Notes (Signed)
02/10/2017- Respiratory care note- Pt extubated at 2259 to 4lpm cannula. Pulmonary mechanics done prior to extubation.  VT 450, FVC 850, NIF -26, RR 16.  Pt also suctioned prior to extubation. A small/moderate amount of clear secretions obtained.  Pt tolerated procedures well.  RN at bedside.  Pt able to vocalize post extubation.  RN instructed pt on use of incentive spirometer. Will follow progress.

## 2017-02-10 NOTE — Progress Notes (Signed)
ANTICOAGULATION CONSULT NOTE  Pharmacy Consult for heparin Indication: chest pain/ACS  No Known Allergies  Patient Measurements: Weight: 189 lb 14.4 oz (86.1 kg) Heparin Dosing Weight: 84.8kg  Vital Signs: Temp: 99.6 F (37.6 C) (04/03 2000) Temp Source: Oral (04/03 2000) BP: 129/79 (04/04 0000) Pulse Rate: 63 (04/04 0000)  Labs:  Recent Labs  02/09/17 0037 02/09/17 0340 02/09/17 0644 02/10/17 0033  HGB 13.5  --   --  12.9*  HCT 39.2*  --   --  37.5*  PLT 138*  --   --  121*  APTT  --   --  34  --   LABPROT  --   --  13.5  --   INR  --   --  1.03  --   HEPARINUNFRC  --   --   --  0.18*  CREATININE 1.11  --   --  0.86  TROPONINI 0.05* 0.54* 1.50*  --     Estimated Creatinine Clearance: 85.1 mL/min (by C-G formula based on SCr of 0.86 mg/dL).   Assessment: 71 y.o. male with CAD awaiting CABG for heparin  Goal of Therapy:  Heparin level 0.3-0.7 units/ml Monitor platelets by anticoagulation protocol: Yes   Plan:  Increase Heparin 1250 units/hr F/U after CABG  Phillis Knack, PharmD, BCPS  02/10/2017 1:12 AM

## 2017-02-11 ENCOUNTER — Encounter (HOSPITAL_COMMUNITY): Payer: Medicare Other

## 2017-02-11 ENCOUNTER — Inpatient Hospital Stay (HOSPITAL_COMMUNITY): Payer: 59

## 2017-02-11 ENCOUNTER — Encounter (HOSPITAL_COMMUNITY): Payer: Self-pay | Admitting: Cardiothoracic Surgery

## 2017-02-11 ENCOUNTER — Encounter: Payer: Self-pay | Admitting: Family Medicine

## 2017-02-11 DIAGNOSIS — I5021 Acute systolic (congestive) heart failure: Secondary | ICD-10-CM

## 2017-02-11 DIAGNOSIS — Z951 Presence of aortocoronary bypass graft: Secondary | ICD-10-CM

## 2017-02-11 LAB — POCT I-STAT, CHEM 8
BUN: 7 mg/dL (ref 6–20)
BUN: 8 mg/dL (ref 6–20)
CALCIUM ION: 1.27 mmol/L (ref 1.15–1.40)
CREATININE: 0.7 mg/dL (ref 0.61–1.24)
Calcium, Ion: 1.31 mmol/L (ref 1.15–1.40)
Chloride: 106 mmol/L (ref 101–111)
Chloride: 104 mmol/L (ref 101–111)
Creatinine, Ser: 0.8 mg/dL (ref 0.61–1.24)
Glucose, Bld: 154 mg/dL — ABNORMAL HIGH (ref 65–99)
Glucose, Bld: 125 mg/dL — ABNORMAL HIGH (ref 65–99)
HEMATOCRIT: 26 % — AB (ref 39.0–52.0)
HEMATOCRIT: 23 % — AB (ref 39.0–52.0)
HEMOGLOBIN: 7.8 g/dL — AB (ref 13.0–17.0)
Hemoglobin: 8.8 g/dL — ABNORMAL LOW (ref 13.0–17.0)
Potassium: 4 mmol/L (ref 3.5–5.1)
Potassium: 4.1 mmol/L (ref 3.5–5.1)
SODIUM: 141 mmol/L (ref 135–145)
SODIUM: 141 mmol/L (ref 135–145)
TCO2: 23 mmol/L (ref 0–100)
TCO2: 24 mmol/L (ref 0–100)

## 2017-02-11 LAB — CBC
HCT: 24.6 % — ABNORMAL LOW (ref 39.0–52.0)
HEMATOCRIT: 26.3 % — AB (ref 39.0–52.0)
HEMOGLOBIN: 8.9 g/dL — AB (ref 13.0–17.0)
HEMOGLOBIN: 8.3 g/dL — AB (ref 13.0–17.0)
MCH: 31.9 pg (ref 26.0–34.0)
MCH: 32.2 pg (ref 26.0–34.0)
MCHC: 33.8 g/dL (ref 30.0–36.0)
MCHC: 33.7 g/dL (ref 30.0–36.0)
MCV: 94.3 fL (ref 78.0–100.0)
MCV: 95.3 fL (ref 78.0–100.0)
Platelets: 93 10*3/uL — ABNORMAL LOW (ref 150–400)
Platelets: 67 10*3/uL — ABNORMAL LOW (ref 150–400)
RBC: 2.79 MIL/uL — ABNORMAL LOW (ref 4.22–5.81)
RBC: 2.58 MIL/uL — AB (ref 4.22–5.81)
RDW: 12.2 % (ref 11.5–15.5)
RDW: 12.4 % (ref 11.5–15.5)
WBC: 7.6 10*3/uL (ref 4.0–10.5)
WBC: 8.6 10*3/uL (ref 4.0–10.5)

## 2017-02-11 LAB — CREATININE, SERUM
Creatinine, Ser: 0.84 mg/dL (ref 0.61–1.24)
GFR calc non Af Amer: >60 mL/min (ref >60)

## 2017-02-11 LAB — POCT I-STAT 3, ART BLOOD GAS (G3+)
ACID-BASE DEFICIT: 2 mmol/L (ref 0.0–2.0)
ACID-BASE DEFICIT: 3 mmol/L — AB (ref 0.0–2.0)
BICARBONATE: 22.3 mmol/L (ref 20.0–28.0)
Bicarbonate: 21.6 mmol/L (ref 20.0–28.0)
O2 Saturation: 93 %
O2 Saturation: 93 %
PH ART: 7.372 (ref 7.350–7.450)
PH ART: 7.391 (ref 7.350–7.450)
Patient temperature: 37.3
TCO2: 23 mmol/L (ref 0–100)
TCO2: 23 mmol/L (ref 0–100)
pCO2 arterial: 36.8 mmHg (ref 32.0–48.0)
pCO2 arterial: 37.3 mmHg (ref 32.0–48.0)
pO2, Arterial: 69 mmHg — ABNORMAL LOW (ref 83.0–108.0)
pO2, Arterial: 70 mmHg — ABNORMAL LOW (ref 83.0–108.0)

## 2017-02-11 LAB — GLUCOSE, CAPILLARY
GLUCOSE-CAPILLARY: 110 mg/dL — AB (ref 65–99)
GLUCOSE-CAPILLARY: 111 mg/dL — AB (ref 65–99)
GLUCOSE-CAPILLARY: 112 mg/dL — AB (ref 65–99)
Glucose-Capillary: 144 mg/dL — ABNORMAL HIGH (ref 65–99)
Glucose-Capillary: 79 mg/dL (ref 65–99)
Glucose-Capillary: 80 mg/dL (ref 65–99)
Glucose-Capillary: 85 mg/dL (ref 65–99)

## 2017-02-11 LAB — BASIC METABOLIC PANEL
Anion gap: 6 (ref 5–15)
BUN: 6 mg/dL (ref 6–20)
CHLORIDE: 110 mmol/L (ref 101–111)
CO2: 23 mmol/L (ref 22–32)
CREATININE: 0.83 mg/dL (ref 0.61–1.24)
Calcium: 8.4 mg/dL — ABNORMAL LOW (ref 8.9–10.3)
GFR calc Af Amer: >60 mL/min (ref >60)
GFR calc non Af Amer: >60 mL/min (ref >60)
Glucose, Bld: 117 mg/dL — ABNORMAL HIGH (ref 65–99)
Potassium: 4.1 mmol/L (ref 3.5–5.1)
Sodium: 139 mmol/L (ref 135–145)

## 2017-02-11 LAB — COOXEMETRY PANEL
Carboxyhemoglobin: 1.2 % (ref 0.5–1.5)
METHEMOGLOBIN: 1.1 % (ref 0.0–1.5)
O2 Saturation: 63 %
Total hemoglobin: 9 g/dL — ABNORMAL LOW (ref 12.0–16.0)

## 2017-02-11 LAB — MAGNESIUM
Magnesium: 2 mg/dL (ref 1.7–2.4)
Magnesium: 2.2 mg/dL (ref 1.7–2.4)

## 2017-02-11 LAB — HEMOGLOBIN A1C
Hgb A1c MFr Bld: 5 % (ref 4.8–5.6)
Mean Plasma Glucose: 97 mg/dL

## 2017-02-11 MED ORDER — PHENYLEPHRINE HCL 10 MG/ML IJ SOLN
0.0000 ug/min | INTRAMUSCULAR | Status: DC
  Administered 2017-02-11: 90 ug/min via INTRAVENOUS
  Filled 2017-02-11 (×3): qty 4

## 2017-02-11 MED ORDER — ALBUMIN HUMAN 5 % IV SOLN
12.5000 g | Freq: Once | INTRAVENOUS | Status: AC
  Administered 2017-02-11: 12.5 g via INTRAVENOUS
  Filled 2017-02-11: qty 250

## 2017-02-11 MED ORDER — INSULIN ASPART 100 UNIT/ML ~~LOC~~ SOLN
0.0000 [IU] | SUBCUTANEOUS | Status: DC
  Administered 2017-02-11: 2 [IU] via SUBCUTANEOUS

## 2017-02-11 MED ORDER — ENOXAPARIN SODIUM 30 MG/0.3ML ~~LOC~~ SOLN
30.0000 mg | Freq: Every day | SUBCUTANEOUS | Status: DC
  Administered 2017-02-11: 30 mg via SUBCUTANEOUS
  Filled 2017-02-11: qty 0.3

## 2017-02-11 MED FILL — Sodium Bicarbonate IV Soln 8.4%: INTRAVENOUS | Qty: 50 | Status: AC

## 2017-02-11 MED FILL — Mannitol IV Soln 20%: INTRAVENOUS | Qty: 500 | Status: AC

## 2017-02-11 MED FILL — Calcium Chloride Inj 10%: INTRAVENOUS | Qty: 10 | Status: AC

## 2017-02-11 MED FILL — Electrolyte-R (PH 7.4) Solution: INTRAVENOUS | Qty: 4000 | Status: AC

## 2017-02-11 MED FILL — Sodium Chloride IV Soln 0.9%: INTRAVENOUS | Qty: 2000 | Status: AC

## 2017-02-11 MED FILL — Heparin Sodium (Porcine) Inj 1000 Unit/ML: INTRAMUSCULAR | Qty: 10 | Status: AC

## 2017-02-11 MED FILL — Lidocaine HCl IV Inj 20 MG/ML: INTRAVENOUS | Qty: 5 | Status: AC

## 2017-02-11 NOTE — Progress Notes (Signed)
Advanced Heart Failure Rounding Note   Subjective:    02/10/17 S/P CABG x3   Extubated last night. Remains on dopamine 5 mcg, milrinone 0.1 mcg, and neo. Todays CO-OX is 63%.   Complaining of chest discomfort. Denies SOB.   Objective:   Weight Range:  Vital Signs:   Temp:  [97.2 F (36.2 C)-99.7 F (37.6 C)] 98.4 F (36.9 C) (04/05 0815) Pulse Rate:  [85-90] 89 (04/05 0815) Resp:  [12-25] 17 (04/05 0815) BP: (92-133)/(47-79) 107/47 (04/05 0800) SpO2:  [91 %-100 %] 100 % (04/05 0815) Arterial Line BP: (86-129)/(41-64) 119/47 (04/05 0815) FiO2 (%):  [40 %-100 %] 40 % (04/04 2221) Weight:  [201 lb 4.5 oz (91.3 kg)] 201 lb 4.5 oz (91.3 kg) (04/05 0500) Last BM Date: 02/08/17  Weight change: Filed Weights   02/09/17 2000 02/10/17 0400 02/11/17 0500  Weight: 189 lb 14.4 oz (86.1 kg) 188 lb 3.2 oz (85.4 kg) 201 lb 4.5 oz (91.3 kg)    Intake/Output:   Intake/Output Summary (Last 24 hours) at 02/11/17 1058 Last data filed at 02/11/17 0800  Gross per 24 hour  Intake          7445.04 ml  Output             7465 ml  Net           -19.96 ml     Physical Exam: General:  Drowsy.  No resp difficulty HEENT: normal Neck: supple. JVP 11-12  . Carotids 2+ bilat; no bruits. No lymphadenopathy or thryomegaly appreciated. RIJ Swan.  Cor: PMI nondisplaced. Regular rate & rhythm. No rubs, gallops or murmurs. Lungs: on 4 liters. Decreased in the bases Abdomen: soft, nontender, nondistended. No hepatosplenomegaly. No bruits or masses. Hypoactive BS Extremities: no cyanosis, clubbing, rash, edema. R and LLE 1+ edema. RUE A line  Neuro: alert & orientedx3, cranial nerves grossly intact. moves all 4 extremities w/o difficulty. Affect pleasant Skin- Sternal dressing intact.  GU: Foley yellow urine.   Telemetry: A paced 90  Labs: Basic Metabolic Panel:  Recent Labs Lab 02/09/17 0037 02/10/17 0033  02/10/17 1045 02/10/17 1224 02/10/17 1449 02/10/17 1625 02/10/17 2211  02/10/17 2217 02/11/17 0423  NA 138 137  < > 139 138 138 140  --  141 139  K 3.4* 3.4*  < > 4.2 4.2 3.9 4.3  --  4.0 4.1  CL 108 105  < > 106 98* 105  --   --  106 110  CO2 23 24  --   --   --   --   --   --   --  23  GLUCOSE 140* 101*  < > 102* 78 139* 107*  --  154* 117*  BUN 16 12  < > 10 7 10   --   --  7 6  CREATININE 1.11 0.86  < > 0.70 0.40* 0.80  --  0.86 0.80 0.83  CALCIUM 9.4 9.1  --   --   --   --   --   --   --  8.4*  MG  --  1.7  --   --   --   --   --  2.5*  --  2.0  < > = values in this interval not displayed.  Liver Function Tests:  Recent Labs Lab 02/09/17 0037  AST 37  ALT 27  ALKPHOS 66  BILITOT 1.0  PROT 7.4  ALBUMIN 3.8   No results for input(s): LIPASE,  AMYLASE in the last 168 hours. No results for input(s): AMMONIA in the last 168 hours.  CBC:  Recent Labs Lab 02/09/17 0037 02/10/17 0033  02/10/17 1310  02/10/17 1618 02/10/17 1625 02/10/17 2211 02/10/17 2217 02/11/17 0423  WBC 5.4 5.5  --   --   --  14.1*  --  8.4  --  7.6  NEUTROABS 3.5 2.6  --   --   --   --   --   --   --   --   HGB 13.5 12.9*  < > 9.6*  < > 11.1* 10.2* 9.5* 8.8* 8.9*  HCT 39.2* 37.5*  < > 27.8*  < > 33.6* 30.0* 28.7* 26.0* 26.3*  MCV 94.9 94.5  --   --   --  95.5  --  95.0  --  94.3  PLT 138* 121*  --  108*  --  107*  --  92*  --  93*  < > = values in this interval not displayed.  Cardiac Enzymes:  Recent Labs Lab 02/09/17 0037 02/09/17 0340 02/09/17 0644  TROPONINI 0.05* 0.54* 1.50*    BNP: BNP (last 3 results) No results for input(s): BNP in the last 8760 hours.  ProBNP (last 3 results) No results for input(s): PROBNP in the last 8760 hours.    Other results:  Imaging: Dg Chest Port 1 View  Result Date: 02/11/2017 CLINICAL DATA:  Status post CABG yesterday EXAM: PORTABLE CHEST 1 VIEW COMPARISON:  Portable chest x-ray of February 10, 2017 FINDINGS: There has been interval extubation of the trachea and esophagus. The lungs are well-expanded. There is  increased density in the retrocardiac region. There is no pneumothorax or significant pleural effusion. The cardiac silhouette is enlarged. The pulmonary vascularity is mildly engorged. There is calcification in the wall of the aortic arch. The Swan-Ganz catheter tip projects in the right main pulmonary artery. The sternal wires are intact. The left-sided chest tube tip projects over the posterior aspect of the fourth rib. IMPRESSION: CHF with mild pulmonary interstitial edema. Left lower lobe atelectasis or pneumonia. No pneumothorax nor large pleural effusion. The remaining support devices are in reasonable position. Thoracic aortic atherosclerosis. Electronically Signed   By: David  Martinique M.D.   On: 02/11/2017 07:32   Dg Chest Port 1 View  Result Date: 02/10/2017 CLINICAL DATA:  Status post CABG.  Evaluation of support apparatus. EXAM: PORTABLE CHEST 1 VIEW COMPARISON:  Chest radiograph 02/09/2017 FINDINGS: The endotracheal tube tip is at the level of the clavicular heads. Orogastric tube courses below the diaphragm, but the tip and side port are not visualized. There are now median sternotomy wires and CABG markers present. A right internal jugular vein approach pulmonary arterial catheter is now present with tip with likely in the proximal right pulmonary artery. Left-sided chest tube tip overlies the left upper lobe. Cardiomediastinal silhouette is unchanged in size. There are bilateral parahilar opacities there is left retrocardiac consolidation, likely atelectasis. IMPRESSION: 1. Radiographically appropriate position of endotracheal tube. 2. Pulmonary artery catheter tip overlying the proximal right pulmonary artery. Electronically Signed   By: Ulyses Jarred M.D.   On: 02/10/2017 16:54     Medications:     Scheduled Medications: . acetaminophen  1,000 mg Oral Q6H   Or  . acetaminophen (TYLENOL) oral liquid 160 mg/5 mL  1,000 mg Per Tube Q6H  . aspirin EC  325 mg Oral Daily   Or  . aspirin   324 mg Per Tube Daily  .  atorvastatin  40 mg Oral q1800  . bisacodyl  10 mg Oral Daily   Or  . bisacodyl  10 mg Rectal Daily  . cefUROXime (ZINACEF)  IV  1.5 g Intravenous Q12H  . docusate sodium  200 mg Oral Daily  . enoxaparin (LOVENOX) injection  30 mg Subcutaneous QHS  . insulin aspart  0-24 Units Subcutaneous Q4H  . insulin regular  0-10 Units Intravenous TID WC  . mouth rinse  15 mL Mouth Rinse BID  . metoCLOPramide (REGLAN) injection  10 mg Intravenous Q6H  . metoprolol tartrate  12.5 mg Oral BID   Or  . metoprolol tartrate  12.5 mg Per Tube BID  . [START ON 02/12/2017] pantoprazole  40 mg Oral Daily  . potassium chloride (KCL MULTIRUN) 30 mEq in 265 mL IVPB  30 mEq Intravenous Once  . sodium chloride flush  3 mL Intravenous Q12H    Infusions: . sodium chloride 20 mL/hr at 02/11/17 0800  . sodium chloride Stopped (02/11/17 0600)  . sodium chloride Stopped (02/10/17 2200)  . dexmedetomidine (PRECEDEX) IV infusion Stopped (02/10/17 2200)  . DOPamine 5 mcg/kg/min (02/11/17 0800)  . insulin (NOVOLIN-R) infusion Stopped (02/10/17 1800)  . lactated ringers 20 mL/hr at 02/11/17 0800  . lactated ringers 20 mL/hr at 02/11/17 0800  . milrinone 0.1 mcg/kg/min (02/11/17 0800)  . nitroGLYCERIN Stopped (02/10/17 1615)  . phenylephrine (NEO-SYNEPHRINE) Adult infusion 80 mcg/min (02/11/17 0800)    PRN Medications: sodium chloride, lactated ringers, metoprolol, midazolam, morphine injection, morphine injection, ondansetron (ZOFRAN) IV, oxyCODONE, sodium chloride flush, traMADol   Assessment/Plan/Discussion    1. CAD- S/P CABG x3.  Weaning pressors per CT surgery. Hopefully can diuresis soon. Mobilize today. Pain controlled.  2. Acute Systolic Heart Failure- ICM. ECHO EF ~25%.  Weaning pressors as above. CO-OX stable today. Renal function stable.  3. Hyperlipidemia- on statin  4. Arthritis 5. Anemia- Expected blood loss. Follow hgb.    Length of Stay: 2 Amy Clegg NP-C   02/11/2017, 10:58 AM  Advanced Heart Failure Team Pager [phone] (M-F; [place])  Please contact [facility] for night-coverage after hours (4p -7a ) and weekends on [email or web address]  Patient seen and examined with Darrick Grinder, NP. We discussed all aspects of the encounter. I agree with the assessment and plan as stated above.   He is doing well POD #1. Maintaining NSR. Co-ox ok. Will wean inotropes as tolerated. Renal function stable.   Diurese as tolerated.   Glori Bickers, MD  2:33 PM

## 2017-02-11 NOTE — Op Note (Signed)
NAME:  Seth Byrd, Seth Byrd                   ACCOUNT NO.:  MEDICAL RECORD NO.:  [id number]  LOCATION:                                 FACILITY:  PHYSICIAN:  Lanelle Bal, MD    DATE OF BIRTH:  [birth year]  DATE OF PROCEDURE:  02/10/2017 OPERATIVE REPORT PREOPERATIVE DIAGNOSES:  Severe ischemic cardiomyopathy with left main and severe 2 vessel coronary artery disease, moderate mitral regurgitation. POSTOPERATIVE DIAGNOSES:  Severe ischemic cardiomyopathy with left main and severe 2 vessel coronary artery disease, moderate mitral regurgitation. SURGICAL PROCEDURE:  Coronary artery bypass grafting x3 with the left internal mammary to the left anterior descending coronary artery, reversed saphenous vein graft to the diagonal coronary artery, reversed saphenous vein graft to the circumflex coronary artery with bilateral thigh endo vein harvesting of the greater saphenous vein and opened left lower leg greater saphenous vein harvesting.Placement of right femorql aline with sonosite guidance   SURGEON:  Lanelle Bal, MD.  FIRST ASSISTANT:  Nicholes Rough, [place].  BRIEF HISTORY:  The patient is a 71 year old male with a total previous cardiac history who presents to [facility] several days prior to surgery, feeling poorly, and vague chest discomfort.  Troponins were mildly elevated.  The patient underwent cardiac catheterization with ventriculogram.  The patient had severe diffuse disease in the distal left main, proximal LAD, and proximal circumflex, all greater than 90%. Ejection fraction was markedly depressed.  The ventriculogram was underfilled, but indicated ejection fraction of 20% and probable mitral regurgitation though this could not be quantified.  The patient had 40% and 50% lesions in the right coronary artery.  He was transferred to [facility], admitted to the heart failure team, and after review of his echocardiogram and the patient's history and symptoms, the  echo demonstrated very mild aortic insufficiency, moderate mitral insufficiency but without flail leaflets and a dilated left ventricle. At the time of surgery, the patient also had moderate TR.  With the critical nature of his coronary disease, decided to proceed with urgent coronary artery bypass grafting.  The patient's renal function was stable.  He did have history of COPD, but had not smoked for 15 years. Risks and options were discussed with the patient in detail.  He agreed and signed informed consent.  DESCRIPTION OF PROCEDURE:  The patient underwent general endotracheal anesthesia without incident.  Dr. Angela Burke placed TEE probe and carefully examined this and this confirmed the preoperative echo with moderate MR with a moderately dilated mitral annulus and ejection fraction, global hypokinesis in the 20%.  The patient had no flail segments of the mitral leaflets.  He also had moderate mitral regurgitation and some enlargement of the right ventricle.  The skin of chest and legs was prepped with Betadine and draped in usual sterile manner.  Appropriate time-out was performed.  Then, we proceeded with endo vein harvesting.  The right greater saphenous vein was harvested from the right thigh and upper calf.  As we were harvesting, it appeared to be suitable vein.  However, after it was removed, major portion of vein did not dilate.  One segment of vein was usable and of good quality.  We moved to the left leg and in a similar fashion, harvested the left greater saphenous vein.  This too was relatively small and fibrotic and  did not dilate.  We then opened the left lower leg at the ankle, located a very good quality saphenous vein, and with open technique, harvested the left lower leg saphenous vein.  This portion of vein was of good quality and caliber.  Median sternotomy was performed. Left internal mammary artery was dissected down as a pedicle graft. Distal artery was  divided, had good free flow.  The pericardium was opened.  As noted, the patient had significant left ventricular enlargement and global hypokinesis.  The patient was systemically heparinized.  Ascending aorta was cannulated.  A retrograde cardioplegic catheter was placed.  Dual stage venous cannula was placed.  The patient was placed on cardiopulmonary bypass 2.4 L/min/m2.  On palpation, the proximal third of the ascending aorta was severely calcified.  The remainder of the aorta appeared normal for cannulation.  The patient's body temperature cooled to 32 degrees.  Aortic crossclamp was applied. 500 mL of cold blood potassium cardioplegia was administered. Additional retrograde cardioplegia was also administered.  We then proceeded with elevating heart and locating the circumflex coronary artery, which was opened and admitted a 1.5 mm probe.  Using a running 7- 0 Prolene, distal anastomosis was performed.  Additional cold blood cardioplegia was administered down the vein graft.  Although likely the diagonal coronary artery was a large vessel and on my review of the films, there was significant stenosis of the LAD after the takeoff of the diagonal.  For this reason, we decided to bypass both the diagonal and the LAD.  The diagonal vessel was opened, admitted a 1.5 mm probe. Using a running 7-0 Prolene, distal anastomosis was performed.  We then turned our attention to the LAD and the distal third of the LAD was opened.  This vessel was actually smaller than the diagonal.  A 1 mm probe would not pass back into the main body of the LAD prior to the takeoff of the diagonal.  Using a running 8-0 Prolene, left internal mammary artery was anastomosed to left anterior descending coronary artery.  With release of the bulldog on the mammary artery, there was a rise in myocardial septal temperature.  Bulldog was placed back on the mammary artery.  Additional cold blood cardioplegia was  administered. With lack of a suitable vein and the noncritical disease in the right coronary artery, this was left unbypassed.  With the crossclamp still in place, the 2-punch aortotomies were made relatively high on the aorta one toward the right lateral side.  This area of the aorta was adequate to anastomoses and 2 vein grafts were anastomosed to the ascending aorta.  The heart was allowed to passively fill and de-air.  The bulldog on the mammary artery was removed with rise in myocardial septal temperature.  The aortic crossclamp was then removed with total crossclamp time of 80 minutes.  The patient spontaneously converted to a sinus rhythm.  He had been loaded with Milrinone and dopamine infusion was started.  Sites of anastomoses were inspected, free of bleeding. The patient was then ventilated and weaned from cardiopulmonary bypass without difficulty.  Other than bypass, no specific intervention to the mitral valve was performed.  As we came off bypass, the TEE was carefully monitored and the mitral regurgitation had almost completely disappeared just trace.  The patient was decannulated in usual fashion. Protamine sulfate was administered.  He was decannulated in usual fashion.  Atrial and ventricular pacing wires had been applied.  Graft markers were applied.  Left pleural tube and  Blake mediastinal drain were left in place.  Pericardium was loosely reapproximated.  Sternum was closed with #6 stainless steel wire.  Fascia closed with interrupted 0 Vicryl, running 3-0 Vicryl in subcutaneous tissue, 4-0 subcuticular stitch in skin edges.  Dry dressings were applied.  Sponge and needle counts were reported as correct at completion of the procedure.  The patient tolerated the procedure without obvious complication.  Total pump time was 126 minutes.  The patient did not require any blood products during the operative procedure.  It should be noted, at the beginning of the procedure  with ultrasound guidance, a right femoral arterial line was placed for monitoring and potential vascular access showed intra-aortic balloon pump had been necessary.     Lanelle Bal, MD     EG/MEDQ  D:  02/11/2017  T:  02/11/2017  Job:  [id number]

## 2017-02-11 NOTE — Progress Notes (Signed)
Patient ID: Seth Byrd, male   DOB: 09-Dec-1945, 71 y.o.   MRN: [id number] [facility] DAILY ICU PROGRESS NOTE                   [address]            [place] [address]          [phone]   1 Day Post-Op Procedure(s) (LRB): CORONARY ARTERY BYPASS GRAFTING (CABG), ON PUMP, TIMES THREE, USING LEFT INTERNAL MAMMARY ARTERY AND ENDOSCOPICALLY HARVESTED BILATERAL GREATER SAPHENOUS VEINS (N/A) TRANSESOPHAGEAL ECHOCARDIOGRAM (TEE) (N/A)  Total Length of Stay:  LOS: 2 days   Subjective: Extubated last night , awake and neuro intact  Objective: Vital signs in last 24 hours: Temp:  [97.2 F (36.2 C)-99.7 F (37.6 C)] 98.4 F (36.9 C) (04/05 0815) Pulse Rate:  [85-90] 89 (04/05 0815) Cardiac Rhythm: Atrial paced (04/05 0800) Resp:  [12-25] 17 (04/05 0815) BP: (92-133)/(47-79) 107/47 (04/05 0800) SpO2:  [91 %-100 %] 100 % (04/05 0815) Arterial Line BP: (86-129)/(41-64) 119/47 (04/05 0815) FiO2 (%):  [40 %-100 %] 40 % (04/04 2221) Weight:  [201 lb 4.5 oz (91.3 kg)] 201 lb 4.5 oz (91.3 kg) (04/05 0500)  Filed Weights   02/09/17 2000 02/10/17 0400 02/11/17 0500  Weight: 189 lb 14.4 oz (86.1 kg) 188 lb 3.2 oz (85.4 kg) 201 lb 4.5 oz (91.3 kg)    Weight change: 11 lb 6.1 oz (5.162 kg)   Hemodynamic parameters for last 24 hours: PAP: (24-32)/(4-16) 29/7 CO:  [4.7 L/min-6.2 L/min] 6.2 L/min CI:  [2.3 L/min/m2-3 L/min/m2] 3 L/min/m2  Intake/Output from previous day: 04/04 0701 - 04/05 0700 In: 7342.5 [I.V.:5082.5; Blood:560; IV Piggyback:1700] Out: 3662 [HUTML:4650; Emesis/NG output:50; Blood:1200; Chest Tube:540]  Intake/Output this shift: Total I/O In: 102.5 [I.V.:102.5] Out: 300 [Urine:300]  Current Meds: Scheduled Meds: . acetaminophen  1,000 mg Oral Q6H   Or  . acetaminophen (TYLENOL) oral liquid 160 mg/5 mL  1,000 mg Per Tube Q6H  . aspirin EC  325 mg Oral Daily   Or  . aspirin  324 mg Per Tube Daily  . atorvastatin  40 mg Oral q1800  . bisacodyl  10 mg  Oral Daily   Or  . bisacodyl  10 mg Rectal Daily  . cefUROXime (ZINACEF)  IV  1.5 g Intravenous Q12H  . docusate sodium  200 mg Oral Daily  . insulin aspart  0-24 Units Subcutaneous Q4H  . insulin regular  0-10 Units Intravenous TID WC  . mouth rinse  15 mL Mouth Rinse BID  . metoCLOPramide (REGLAN) injection  10 mg Intravenous Q6H  . metoprolol tartrate  12.5 mg Oral BID   Or  . metoprolol tartrate  12.5 mg Per Tube BID  . [START ON 02/12/2017] pantoprazole  40 mg Oral Daily  . potassium chloride (KCL MULTIRUN) 30 mEq in 265 mL IVPB  30 mEq Intravenous Once  . sodium chloride flush  3 mL Intravenous Q12H   Continuous Infusions: . sodium chloride 20 mL/hr at 02/11/17 0800  . sodium chloride Stopped (02/11/17 0600)  . sodium chloride Stopped (02/10/17 2200)  . dexmedetomidine (PRECEDEX) IV infusion Stopped (02/10/17 2200)  . DOPamine 5 mcg/kg/min (02/11/17 0800)  . insulin (NOVOLIN-R) infusion Stopped (02/10/17 1800)  . lactated ringers 20 mL/hr at 02/11/17 0800  . lactated ringers 20 mL/hr at 02/11/17 0800  . milrinone 0.1 mcg/kg/min (02/11/17 0800)  . nitroGLYCERIN Stopped (02/10/17 1615)  . phenylephrine (NEO-SYNEPHRINE) Adult infusion 80 mcg/min (02/11/17 0800)  PRN Meds:.sodium chloride, lactated ringers, metoprolol, midazolam, morphine injection, morphine injection, ondansetron (ZOFRAN) IV, oxyCODONE, sodium chloride flush, traMADol  General appearance: alert, cooperative, appears older than stated age and no distress Neurologic: intact Heart: regular rate and rhythm, S1, S2 normal, no murmur, click, rub or gallop Lungs: diminished breath sounds bibasilar Abdomen: soft, non-tender; bowel sounds normal; no masses,  no organomegaly Extremities: extremities normal, atraumatic, no cyanosis or edema and Homans sign is negative, no sign of DVT Wound: sternum intact  Lab Results: CBC: Recent Labs  02/10/17 2211 02/10/17 2217 02/11/17 0423  WBC 8.4  --  7.6  HGB 9.5* 8.8*  8.9*  HCT 28.7* 26.0* 26.3*  PLT 92*  --  93*   BMET:  Recent Labs  02/10/17 0033  02/10/17 2217 02/11/17 0423  NA 137  < > 141 139  K 3.4*  < > 4.0 4.1  CL 105  < > 106 110  CO2 24  --   --  23  GLUCOSE 101*  < > 154* 117*  BUN 12  < > 7 6  CREATININE 0.86  < > 0.80 0.83  CALCIUM 9.1  --   --  8.4*  < > = values in this interval not displayed.  CMET: Lab Results  Component Value Date   WBC 7.6 02/11/2017   HGB 8.9 (L) 02/11/2017   HCT 26.3 (L) 02/11/2017   PLT 93 (L) 02/11/2017   GLUCOSE 117 (H) 02/11/2017   CHOL 139 05/29/2016   TRIG 85 05/29/2016   HDL 42 05/29/2016   LDLCALC 80 05/29/2016   ALT 27 02/09/2017   AST 37 02/09/2017   NA 139 02/11/2017   K 4.1 02/11/2017   CL 110 02/11/2017   CREATININE 0.83 02/11/2017   BUN 6 02/11/2017   CO2 23 02/11/2017   TSH 2.560 11/25/2015   INR 1.51 02/10/2017   HGBA1C 5.0 02/10/2017      PT/INR:  Recent Labs  02/10/17 1618  LABPROT 18.4*  INR 1.51   Radiology: Dg Chest Port 1 View  Result Date: 02/11/2017 CLINICAL DATA:  Status post CABG yesterday EXAM: PORTABLE CHEST 1 VIEW COMPARISON:  Portable chest x-ray of February 10, 2017 FINDINGS: There has been interval extubation of the trachea and esophagus. The lungs are well-expanded. There is increased density in the retrocardiac region. There is no pneumothorax or significant pleural effusion. The cardiac silhouette is enlarged. The pulmonary vascularity is mildly engorged. There is calcification in the wall of the aortic arch. The Swan-Ganz catheter tip projects in the right main pulmonary artery. The sternal wires are intact. The left-sided chest tube tip projects over the posterior aspect of the fourth rib. IMPRESSION: CHF with mild pulmonary interstitial edema. Left lower lobe atelectasis or pneumonia. No pneumothorax nor large pleural effusion. The remaining support devices are in reasonable position. Thoracic aortic atherosclerosis. Electronically Signed   By: David   Martinique M.D.   On: 02/11/2017 07:32   Dg Chest Port 1 View  Result Date: 02/10/2017 CLINICAL DATA:  Status post CABG.  Evaluation of support apparatus. EXAM: PORTABLE CHEST 1 VIEW COMPARISON:  Chest radiograph 02/09/2017 FINDINGS: The endotracheal tube tip is at the level of the clavicular heads. Orogastric tube courses below the diaphragm, but the tip and side port are not visualized. There are now median sternotomy wires and CABG markers present. A right internal jugular vein approach pulmonary arterial catheter is now present with tip with likely in the proximal right pulmonary artery. Left-sided chest tube tip  overlies the left upper lobe. Cardiomediastinal silhouette is unchanged in size. There are bilateral parahilar opacities there is left retrocardiac consolidation, likely atelectasis. IMPRESSION: 1. Radiographically appropriate position of endotracheal tube. 2. Pulmonary artery catheter tip overlying the proximal right pulmonary artery. Electronically Signed   By: Ulyses Jarred M.D.   On: 02/10/2017 16:54     Assessment/Plan: S/P Procedure(s) (LRB): CORONARY ARTERY BYPASS GRAFTING (CABG), ON PUMP, TIMES THREE, USING LEFT INTERNAL MAMMARY ARTERY AND ENDOSCOPICALLY HARVESTED BILATERAL GREATER SAPHENOUS VEINS (N/A) TRANSESOPHAGEAL ECHOCARDIOGRAM (TEE) (N/A) Mobilize Diuresis See progression orders Expected Acute  Blood - loss Anemia    [name] 02/11/2017 8:43 AM

## 2017-02-11 NOTE — Progress Notes (Signed)
CT surgery p.m. Rounds  Patient had good day with weaning of inotropic drips Maintaining sinus rhythm atrially paced Ambulated in the hall Hemoglobin 8.3 this p.m.

## 2017-02-12 ENCOUNTER — Inpatient Hospital Stay (HOSPITAL_COMMUNITY): Payer: 59

## 2017-02-12 DIAGNOSIS — D696 Thrombocytopenia, unspecified: Secondary | ICD-10-CM

## 2017-02-12 LAB — GLUCOSE, CAPILLARY
GLUCOSE-CAPILLARY: 99 mg/dL (ref 65–99)
GLUCOSE-CAPILLARY: 109 mg/dL — AB (ref 65–99)
GLUCOSE-CAPILLARY: 89 mg/dL (ref 65–99)
Glucose-Capillary: 83 mg/dL (ref 65–99)
Glucose-Capillary: 75 mg/dL (ref 65–99)

## 2017-02-12 LAB — CBC
HCT: 24 % — ABNORMAL LOW (ref 39.0–52.0)
Hemoglobin: 7.9 g/dL — ABNORMAL LOW (ref 13.0–17.0)
MCH: 31.6 pg (ref 26.0–34.0)
MCHC: 32.9 g/dL (ref 30.0–36.0)
MCV: 96 fL (ref 78.0–100.0)
Platelets: 56 10*3/uL — ABNORMAL LOW (ref 150–400)
RBC: 2.5 MIL/uL — ABNORMAL LOW (ref 4.22–5.81)
RDW: 12.6 % (ref 11.5–15.5)
WBC: 6.5 10*3/uL (ref 4.0–10.5)

## 2017-02-12 LAB — BASIC METABOLIC PANEL
Anion gap: 7 (ref 5–15)
BUN: 7 mg/dL (ref 6–20)
CO2: 25 mmol/L (ref 22–32)
Calcium: 8.5 mg/dL — ABNORMAL LOW (ref 8.9–10.3)
Chloride: 105 mmol/L (ref 101–111)
Creatinine, Ser: 0.8 mg/dL (ref 0.61–1.24)
GFR calc Af Amer: >60 mL/min (ref >60)
GFR calc non Af Amer: >60 mL/min (ref >60)
Glucose, Bld: 113 mg/dL — ABNORMAL HIGH (ref 65–99)
Potassium: 3.9 mmol/L (ref 3.5–5.1)
Sodium: 137 mmol/L (ref 135–145)

## 2017-02-12 LAB — COOXEMETRY PANEL
Carboxyhemoglobin: 1.2 % (ref 0.5–1.5)
Methemoglobin: 1.1 % (ref 0.0–1.5)
O2 Saturation: 56.8 %
Total hemoglobin: 8.1 g/dL — ABNORMAL LOW (ref 12.0–16.0)

## 2017-02-12 MED ORDER — FUROSEMIDE 10 MG/ML IJ SOLN
40.0000 mg | Freq: Once | INTRAMUSCULAR | Status: AC
  Administered 2017-02-12: 40 mg via INTRAVENOUS
  Filled 2017-02-12: qty 4

## 2017-02-12 MED ORDER — FOLIC ACID 1 MG PO TABS
1.0000 mg | ORAL_TABLET | Freq: Every day | ORAL | Status: DC
  Administered 2017-02-12 – 2017-02-19 (×8): 1 mg via ORAL
  Filled 2017-02-12 (×8): qty 1

## 2017-02-12 NOTE — Discharge Summary (Signed)
Physician Discharge Summary  Patient ID: Seth Byrd MRN: [id number] DOB/AGE: 70-Oct-1947 70 y.o.  Admit date: 02/09/2017 Discharge date: 02/19/2017  Admission Diagnoses: Patient Active Problem List   Diagnosis Date Noted  . Chest pain, rule out acute myocardial infarction 02/09/2017  . NSTEMI (non-ST elevated myocardial infarction) ([place]) 02/09/2017  . Essential hypertension 11/25/2015  . Hyperlipidemia 11/25/2015  . Panlobular emphysema ([place]) 11/25/2015  . 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 Primary osteoarthritis involving multiple joints 11/25/2015  . Primary osteoarthritis of knee 04/23/2015    Discharge Diagnoses:  Active Problems:   NSTEMI (non-ST elevated myocardial infarction) ([facility])   S/P CABG x 3   Discharged Condition: good  HPI:  Seth Byrd is a 41 you AA male with history of OA, HTN, Hyperlipidemia, Prostate cancer, and COPD.  He presented to the ED at [facility] last evening with complaint of chest pain.  He stated he was in his usual state of health until the afternoon.  At that time he stated he " just felt something wasn't right."  He thought he was experiencing indigestion with associated nausea, diaphoresis and shortness of breath.  He called his daughter who brought the patient to the ED.  Workup in the ED showed no ST elevation on EKG.  Cardiac enzymes were positive.  He was ruled in for NSTEMI, admitted by the medicine service and Cardiology consult was obtained.  He was evaluated by Dr. Ubaldo Glassing who performed cardiac catheterization this morning.  This revealed a reduced EF of 20%, and disease of the LAD and Circumflex distribution.  It was felt coronary bypass grafting would be indicated. LV gram was  under filled but suggested MR and severely depressed lv function.  Cardiac surgery consult was requested and he was transferred to [facility] for evaluation  .  Currently the patient is chest pain free.  He admits that he has been having episodes where he just  doesn't feel right for several months with SOB and fatigue with activity  but they have resolved without problems.  He is a previous smoker, but quit a long time ago, but he did smoke about 1/2ppd.  He states his prostate cancer was treated a while ago as well, but he does have an elevated PSA with a new detection of a small amount of cancer on recent biopsy in January 2018, he is currently being followed for.  He lives alone and remains active riding a bike and walking.  He works with mail for a living.      Hospital Course:  Seth Byrd is a 71 year old male who underwent coronary artery bypass grafting on 02/10/2017 with Dr. Lanelle Bal. He tolerated the procedure well and was transferred to the ICU. He was extubated in a timely manner. Postop day 1 he continued to progress. We initiated a diuretic regimen for fluid overload. We mobilized the patient. We continued to wean his inotropic drips. He was maintaining sinus rhythm being atrially paced. Postop day 2 we were able to discontinue his chest tubes and lines. We continued his diuretics. He had some mild thrombocytopenia therefore we held heparin and continued SCDs for DVT prophylaxis. He was weaned off Dopamine as tolerated.  His chest tubes and arterial lines were removed without difficulty.  He was followed closely by advanced heart failure team and his medications have been adjusted as tolerated.  He has maintained NSR and his pacing wires were removed without difficulty.  He has mild post operative anemia with no obvious  source of bleeding.  He will be discharged on a multivitamin.  He is ambulating without difficulty and felt medically stable for discharge to SNF today.  Consults: None  Significant Diagnostic Studies:    Ost LM lesion, 90 %stenosed.  Prox Cx lesion, 95 %stenosed.  Ost LAD lesion, 95 %stenosed.  Prox RCA lesion, 55 %stenosed.  Mid RCA lesion, 40 %stenosed.  Dist RCA lesion, 35 %stenosed.  There is severe left  ventricular systolic dysfunction.  The left ventricular ejection fraction is 25-35% by visual estimate.   Left main 90%, 95% proximal lcx, 95% proximal lad, insignificant disease in rca. Moderate to severely reduced lv funciton with ef 25-25%  MR and AI  Will need consideration for cabg.     Treatments:  NAME:  Seth Byrd, Seth Byrd                   ACCOUNT NO.:  MEDICAL RECORD NO.:  [id number]  LOCATION:                                 FACILITY:  PHYSICIAN:  Lanelle Bal, MD    DATE OF BIRTH:  [birth year]  DATE OF PROCEDURE:  02/10/2017 OPERATIVE REPORT PREOPERATIVE DIAGNOSES:  Severe ischemic cardiomyopathy with left main and severe 2 vessel coronary artery disease, moderate mitral regurgitation. POSTOPERATIVE DIAGNOSES:  Severe ischemic cardiomyopathy with left main and severe 2 vessel coronary artery disease, moderate mitral regurgitation. SURGICAL PROCEDURE:  Coronary artery bypass grafting x3 with the left internal mammary to the left anterior descending coronary artery, reversed saphenous vein graft to the diagonal coronary artery, reversed saphenous vein graft to the circumflex coronary artery with bilateral thigh endo vein harvesting of the greater saphenous vein and opened left lower leg greater saphenous vein harvesting.Placement of right femorql aline with sonosite guidance   SURGEON:  Lanelle Bal, MD.  FIRST ASSISTANT:  Nicholes Rough, [place].  Disposition: SNF  Discharge Medications:  The patient has been discharged on:   1.Beta Blocker:  Yes [ x  ]                              No   [   ]                              If No, reason:  2.Ace Inhibitor/ARB: Yes [  x ]                                     No  [    ]                                     If No, reason:  3.Statin:   Yes [x   ]                  No  [   ]                  If No, reason:  4.Ecasa:  Yes  [  x ]                  No   [   ]  If No, reason:  Discharge  Instructions    Amb Referral to Cardiac Rehabilitation    Complete by:  As directed    Diagnosis:  CABG   CABG X ___:  3     Allergies as of 02/19/2017   No Known Allergies     Medication List    STOP taking these medications   heparin 100-0.45 UNIT/ML-% infusion   lisinopril 5 MG tablet Commonly known as:  PRINIVIL,ZESTRIL   metoprolol tartrate 25 MG tablet Commonly known as:  LOPRESSOR   nitroGLYCERIN 0.4 MG SL tablet Commonly known as:  NITROSTAT     TAKE these medications   acetaminophen 500 MG tablet Commonly known as:  TYLENOL Take 500 mg by mouth every 6 (six) hours as needed.   aspirin 325 MG EC tablet Take 1 tablet (325 mg total) by mouth daily. What changed:  medication strength  how much to take   atorvastatin 40 MG tablet Commonly known as:  LIPITOR Take 1 tablet (40 mg total) by mouth daily at 6 PM.   carvedilol 3.125 MG tablet Commonly known as:  COREG Take 1 tablet (3.125 mg total) by mouth 2 (two) times daily with a meal.   etanercept 50 MG/ML injection Commonly known as:  ENBREL Inject 50 mg into the skin once a week. Saturday   Fish Oil 1200 MG Caps Take 1 capsule by mouth 3 (three) times daily.   furosemide 40 MG tablet Commonly known as:  LASIX Take 1 tablet (40 mg total) by mouth daily.   losartan 25 MG tablet Commonly known as:  COZAAR Take 1 tablet (25 mg total) by mouth daily.   ONE-A-DAY MENS 50+ ADVANTAGE PO Take 1 tablet by mouth daily.   spironolactone 25 MG tablet Commonly known as:  ALDACTONE Take 1 tablet (25 mg total) by mouth daily.   traMADol 50 MG tablet Commonly known as:  ULTRAM Take 1-2 tablets (50-100 mg total) by mouth every 4 (four) hours as needed for moderate pain.   vitamin B-12 1000 MCG tablet Commonly known as:  CYANOCOBALAMIN Take 5,000 mcg by mouth daily.       Contact information for follow-up providers    Keith Rake, MD. Call in 1 day.   Specialty:  Family Medicine Why:  Kris Hartmann TO  [facility] information: [address] [place] [address] [phone]        [name], MD Follow up.   Specialty:  Cardiothoracic Surgery Why:  Your appointment is on May 10th at 1:30pm. Please arrive at 1:00pm for your chest xray which is located on the first floor of our building.  Contact information: [address] [place] [address] [phone]        Darrick Grinder, NP Follow up on 02/25/2017.   Specialty:  Cardiology Why:  9;30 Garage Code 6000  Contact information: [address] [address] [phone]            Contact information for after-discharge care    [facility] Follow up.   Specialty:  [place] information: [place] [phone]                  Signed: Ellwood Handler 02/19/2017, 12:24 PM

## 2017-02-12 NOTE — Care Management Note (Signed)
Case Management Note  Patient Details  Name: Seth Byrd MRN: [id number] Date of Birth: 1946/07/30  Subjective/Objective:  Pt is s/p CABG                  Action/Plan:   PTA from home.  Pt remains on inotropic drip.  Per bedside nurse pt required a lot of assistance for ambulation - PT/PT ordered.  CM will continue to follow for discharge needs   Expected Discharge Date:                  Expected Discharge Plan:     In-House Referral:     Discharge planning Services  CM Consult  Post Acute Care Choice:    Choice offered to:     DME Arranged:    DME Agency:     HH Arranged:    HH Agency:     Status of Service:     If discussed at [employer], dates discussed:    Additional Comments:  Maryclare Labrador, RN 02/12/2017, 2:16 PM

## 2017-02-12 NOTE — Progress Notes (Signed)
Advanced Heart Failure Rounding Note   Subjective:    02/10/17 S/P CABG x3   All drips weaned off except dopamine 3 mcg. Platelets dropping. Earlier today lovenox stopped. HIT panel pending. No bleeding currently.   Platelets 93>67>56.   Complaining of incisional pain. Denies SOB. Co-ox 57%   Objective:   Weight Range:  Vital Signs:   Temp:  [98.1 F (36.7 C)-98.7 F (37.1 C)] 98.7 F (37.1 C) (04/06 0700) Pulse Rate:  [78-100] 82 (04/06 0700) Resp:  [12-34] 15 (04/06 0700) BP: (93-159)/(43-89) 97/56 (04/06 0700) SpO2:  [92 %-100 %] 95 % (04/06 0700) Arterial Line BP: (91-162)/(33-86) 93/33 (04/06 0700) Weight:  [198 lb 13.7 oz (90.2 kg)] 198 lb 13.7 oz (90.2 kg) (04/06 0500) Last BM Date: 02/08/17  Weight change: Filed Weights   02/10/17 0400 02/11/17 0500 02/12/17 0500  Weight: 188 lb 3.2 oz (85.4 kg) 201 lb 4.5 oz (91.3 kg) 198 lb 13.7 oz (90.2 kg)    Intake/Output:   Intake/Output Summary (Last 24 hours) at 02/12/17 0802 Last data filed at 02/12/17 0600  Gross per 24 hour  Intake          1375.95 ml  Output             1915 ml  Net          -539.05 ml     Physical Exam: General: Sitting in the chair.  HEENT: normal. anicteric Neck: supple. JVP to jaw . Carotids 2+ bilat; no bruits. No lymphadenopathy or thryomegaly appreciated. RIJ Swan.  Cor: PMI nondisplaced. RRR.  No rubs, gallops or murmurs. Sternal incision CDI.  Lungs: Decreased in the bases. On 4 liters oxygen.  Abdomen: soft, NT, ND. No hepatosplenomegaly. No bruits or masses. + BS Extremities: no cyanosis, clubbing, rash,  1+ edema RUE A line  Neuro: A&O x3. cranial nerves grossly intact. MAE x4.  Affect pleasant Skin- Sternal dressing intact. Clean. GU: Foley yellow urine.   Telemetry:  NSR 80s personally reviewed.  Labs: Basic Metabolic Panel:  Recent Labs Lab 02/09/17 0037 02/10/17 0033  02/10/17 1449 02/10/17 1625 02/10/17 2211 02/10/17 2217 02/11/17 0423 02/11/17 1754  02/11/17 1803 02/12/17 0400  NA 138 137  < > 138 140  --  141 139 141  --  137  K 3.4* 3.4*  < > 3.9 4.3  --  4.0 4.1 4.1  --  3.9  CL 108 105  < > 105  --   --  106 110 104  --  105  CO2 23 24  --   --   --   --   --  23  --   --  25  GLUCOSE 140* 101*  < > 139* 107*  --  154* 117* 125*  --  113*  BUN 16 12  < > 10  --   --  7 6 8   --  7  CREATININE 1.11 0.86  < > 0.80  --  0.86 0.80 0.83 0.70 0.84 0.80  CALCIUM 9.4 9.1  --   --   --   --   --  8.4*  --   --  8.5*  MG  --  1.7  --   --   --  2.5*  --  2.0  --  2.2  --   < > = values in this interval not displayed.  Liver Function Tests:  Recent Labs Lab 02/09/17 0037  AST 37  ALT 27  ALKPHOS 66  BILITOT 1.0  PROT 7.4  ALBUMIN 3.8   No results for input(s): LIPASE, AMYLASE in the last 168 hours. No results for input(s): AMMONIA in the last 168 hours.  CBC:  Recent Labs Lab 02/09/17 0037 02/10/17 0033  02/10/17 1618  02/10/17 2211 02/10/17 2217 02/11/17 0423 02/11/17 1754 02/11/17 1803 02/12/17 0400  WBC 5.4 5.5  --  14.1*  --  8.4  --  7.6  --  8.6 6.5  NEUTROABS 3.5 2.6  --   --   --   --   --   --   --   --   --   HGB 13.5 12.9*  < > 11.1*  < > 9.5* 8.8* 8.9* 7.8* 8.3* 7.9*  HCT 39.2* 37.5*  < > 33.6*  < > 28.7* 26.0* 26.3* 23.0* 24.6* 24.0*  MCV 94.9 94.5  --  95.5  --  95.0  --  94.3  --  95.3 96.0  PLT 138* 121*  < > 107*  --  92*  --  93*  --  67* 56*  < > = values in this interval not displayed.  Cardiac Enzymes:  Recent Labs Lab 02/09/17 0037 02/09/17 0340 02/09/17 0644  TROPONINI 0.05* 0.54* 1.50*    BNP: BNP (last 3 results) No results for input(s): BNP in the last 8760 hours.  ProBNP (last 3 results) No results for input(s): PROBNP in the last 8760 hours.    Other results:  Imaging: Dg Chest Port 1 View  Result Date: 02/11/2017 CLINICAL DATA:  Status post CABG yesterday EXAM: PORTABLE CHEST 1 VIEW COMPARISON:  Portable chest x-ray of February 10, 2017 FINDINGS: There has been interval  extubation of the trachea and esophagus. The lungs are well-expanded. There is increased density in the retrocardiac region. There is no pneumothorax or significant pleural effusion. The cardiac silhouette is enlarged. The pulmonary vascularity is mildly engorged. There is calcification in the wall of the aortic arch. The Swan-Ganz catheter tip projects in the right main pulmonary artery. The sternal wires are intact. The left-sided chest tube tip projects over the posterior aspect of the fourth rib. IMPRESSION: CHF with mild pulmonary interstitial edema. Left lower lobe atelectasis or pneumonia. No pneumothorax nor large pleural effusion. The remaining support devices are in reasonable position. Thoracic aortic atherosclerosis. Electronically Signed   By: David  Martinique M.D.   On: 02/11/2017 07:32   Dg Chest Port 1 View  Result Date: 02/10/2017 CLINICAL DATA:  Status post CABG.  Evaluation of support apparatus. EXAM: PORTABLE CHEST 1 VIEW COMPARISON:  Chest radiograph 02/09/2017 FINDINGS: The endotracheal tube tip is at the level of the clavicular heads. Orogastric tube courses below the diaphragm, but the tip and side port are not visualized. There are now median sternotomy wires and CABG markers present. A right internal jugular vein approach pulmonary arterial catheter is now present with tip with likely in the proximal right pulmonary artery. Left-sided chest tube tip overlies the left upper lobe. Cardiomediastinal silhouette is unchanged in size. There are bilateral parahilar opacities there is left retrocardiac consolidation, likely atelectasis. IMPRESSION: 1. Radiographically appropriate position of endotracheal tube. 2. Pulmonary artery catheter tip overlying the proximal right pulmonary artery. Electronically Signed   By: Ulyses Jarred M.D.   On: 02/10/2017 16:54     Medications:     Scheduled Medications: . acetaminophen  1,000 mg Oral Q6H   Or  . acetaminophen (TYLENOL) oral liquid 160 mg/5  mL  1,000 mg Per  Tube Q6H  . aspirin EC  325 mg Oral Daily   Or  . aspirin  324 mg Per Tube Daily  . atorvastatin  40 mg Oral q1800  . bisacodyl  10 mg Oral Daily   Or  . bisacodyl  10 mg Rectal Daily  . cefUROXime (ZINACEF)  IV  1.5 g Intravenous Q12H  . docusate sodium  200 mg Oral Daily  . folic acid  1 mg Oral Daily  . furosemide  40 mg Intravenous Once  . insulin aspart  0-24 Units Subcutaneous Q4H  . mouth rinse  15 mL Mouth Rinse BID  . metoCLOPramide (REGLAN) injection  10 mg Intravenous Q6H  . metoprolol tartrate  12.5 mg Oral BID   Or  . metoprolol tartrate  12.5 mg Per Tube BID  . pantoprazole  40 mg Oral Daily  . potassium chloride (KCL MULTIRUN) 30 mEq in 265 mL IVPB  30 mEq Intravenous Once  . sodium chloride flush  3 mL Intravenous Q12H    Infusions: . sodium chloride Stopped (02/11/17 1100)  . sodium chloride Stopped (02/11/17 0600)  . sodium chloride Stopped (02/10/17 2200)  . dexmedetomidine (PRECEDEX) IV infusion Stopped (02/10/17 2200)  . DOPamine 3 mcg/kg/min (02/12/17 0600)  . lactated ringers 20 mL/hr at 02/12/17 0600  . lactated ringers 20 mL/hr at 02/12/17 0600  . milrinone Stopped (02/11/17 1000)  . nitroGLYCERIN Stopped (02/10/17 1615)  . phenylephrine (NEO-SYNEPHRINE) Adult infusion Stopped (02/11/17 2300)    PRN Medications: sodium chloride, lactated ringers, metoprolol, midazolam, morphine injection, morphine injection, ondansetron (ZOFRAN) IV, oxyCODONE, sodium chloride flush, traMADol   Assessment/Plan/Discussion    1. CAD- POD #2 S/P CABG x3.  Continue dopamine at 3 mcg per Dr Servando Snare.  Mobilize today.  2. Acute Systolic Heart Failure- ICM. ECHO EF ~25%.   Volume status elevated. Received IV lasix earlier. Check CVP. CO-OX pending.  Tomorrow would add low dose spiro.  3. Hyperlipidemia- on statin  4. Arthritis 5. Anemia- Expected blood loss. Hgb 8.3>7.9  6. Thrombocytopenia- Platelets 93> 67>56. Lovenox stopped. HIT panel pending.      Length of Stay: 3 Amy Clegg NP-C  02/12/2017, 8:02 AM  Advanced Heart Failure Team Pager [phone] (M-F; 7a - 4p)  Please contact [facility] for night-coverage after hours (4p -7a ) and weekends on [email or web address]  Patient seen and examined with Darrick Grinder, NP. We discussed all aspects of the encounter. I agree with the assessment and plan as stated above.   He is doing well POD #  CABG. CO-ox marginal but sufficient. Volume up a bit. Continue diuresis.   Maintaining NSR.  Platelets are dropping. No bleeding. HIT panel pending. Off lovenox.   Glori Bickers, MD  3:33 PM

## 2017-02-12 NOTE — Progress Notes (Signed)
Patient ID: Osiah Haring, male   DOB: 1946/09/23, 71 y.o.   MRN: [id number] EVENING ROUNDS NOTE :     [address]       [place] [address]             [phone]                 2 Days Post-Op Procedure(s) (LRB): CORONARY ARTERY BYPASS GRAFTING (CABG), ON PUMP, TIMES THREE, USING LEFT INTERNAL MAMMARY ARTERY AND ENDOSCOPICALLY HARVESTED BILATERAL GREATER SAPHENOUS VEINS (N/A) TRANSESOPHAGEAL ECHOCARDIOGRAM (TEE) (N/A)  Total Length of Stay:  LOS: 3 days  BP (!) 125/56 (BP Location: Left Arm)   Pulse 86   Temp 98.2 F (36.8 C) (Oral)   Resp (!) 26   Ht 5\' 11"  (1.803 m)   Wt 198 lb 13.7 oz (90.2 kg)   SpO2 99%   BMI 27.73 kg/m   .Intake/Output      04/06 0701 - 04/07 0700   I.V. (mL/kg) 377.6 (4.2)   IV Piggyback 50   Total Intake(mL/kg) 427.6 (4.7)   Urine (mL/kg/hr) 1335 (1.1)   Chest Tube 0 (0)   Total Output 1335   Net -907.4         . sodium chloride Stopped (02/11/17 1100)  . sodium chloride Stopped (02/11/17 0600)  . sodium chloride Stopped (02/10/17 2200)  . dexmedetomidine (PRECEDEX) IV infusion Stopped (02/10/17 2200)  . DOPamine 3 mcg/kg/min (02/12/17 2000)  . lactated ringers 20 mL/hr at 02/12/17 1900  . lactated ringers Stopped (02/12/17 0900)  . milrinone Stopped (02/11/17 1000)  . nitroGLYCERIN Stopped (02/10/17 1615)  . phenylephrine (NEO-SYNEPHRINE) Adult infusion Stopped (02/11/17 2300)     Lab Results  Component Value Date   WBC 6.5 02/12/2017   HGB 7.9 (L) 02/12/2017   HCT 24.0 (L) 02/12/2017   PLT 56 (L) 02/12/2017   GLUCOSE 113 (H) 02/12/2017   CHOL 139 05/29/2016   TRIG 85 05/29/2016   HDL 42 05/29/2016   LDLCALC 80 05/29/2016   ALT 27 02/09/2017   AST 37 02/09/2017   NA 137 02/12/2017   K 3.9 02/12/2017   CL 105 02/12/2017   CREATININE 0.80 02/12/2017   BUN 7 02/12/2017   CO2 25 02/12/2017   TSH 2.560 11/25/2015   INR 1.51 02/10/2017   HGBA1C 5.0 02/10/2017   Good response to lasix this am On low dose  do Lorraine Lax MD  [place] [phone] Office [phone] 02/12/2017 8:26 PM

## 2017-02-12 NOTE — Progress Notes (Signed)
Patient ID: Seth Byrd, male   DOB: [birth year], 71 y.o.   MRN: [id number] TCTS DAILY ICU PROGRESS NOTE                   [address]            [place] [address]          [phone]   2 Days Post-Op Procedure(s) (LRB): CORONARY ARTERY BYPASS GRAFTING (CABG), ON PUMP, TIMES THREE, USING LEFT INTERNAL MAMMARY ARTERY AND ENDOSCOPICALLY HARVESTED BILATERAL GREATER SAPHENOUS VEINS (N/A) TRANSESOPHAGEAL ECHOCARDIOGRAM (TEE) (N/A)  Total Length of Stay:  LOS: 3 days   Subjective: Up to chair , but felt weaker trying to walk this am   Objective: Vital signs in last 24 hours: Temp:  [98.1 F (36.7 C)-98.6 F (37 C)] 98.3 F (36.8 C) (04/06 0300) Pulse Rate:  [78-100] 82 (04/06 0700) Cardiac Rhythm: Normal sinus rhythm (04/06 0400) Resp:  [12-34] 15 (04/06 0700) BP: (93-159)/(43-89) 97/56 (04/06 0700) SpO2:  [92 %-100 %] 95 % (04/06 0700) Arterial Line BP: (91-162)/(33-86) 93/33 (04/06 0700) Weight:  [198 lb 13.7 oz (90.2 kg)] 198 lb 13.7 oz (90.2 kg) (04/06 0500)  Filed Weights   02/10/17 0400 02/11/17 0500 02/12/17 0500  Weight: 188 lb 3.2 oz (85.4 kg) 201 lb 4.5 oz (91.3 kg) 198 lb 13.7 oz (90.2 kg)    Weight change: -2 lb 6.8 oz (-1.1 kg)   Hemodynamic parameters for last 24 hours: PAP: (27-47)/(6-25) 46/25  Intake/Output from previous day: 04/05 0701 - 04/06 0700 In: 1478.5 [I.V.:1428.5; IV Piggyback:50] Out: 2215 [Urine:1865; Chest Tube:350]  Intake/Output this shift: No intake/output data recorded.  Current Meds: Scheduled Meds: . acetaminophen  1,000 mg Oral Q6H   Or  . acetaminophen (TYLENOL) oral liquid 160 mg/5 mL  1,000 mg Per Tube Q6H  . aspirin EC  325 mg Oral Daily   Or  . aspirin  324 mg Per Tube Daily  . atorvastatin  40 mg Oral q1800  . bisacodyl  10 mg Oral Daily   Or  . bisacodyl  10 mg Rectal Daily  . cefUROXime (ZINACEF)  IV  1.5 g Intravenous Q12H  . docusate sodium  200 mg Oral Daily  . enoxaparin (LOVENOX) injection   30 mg Subcutaneous QHS  . furosemide  40 mg Intravenous Once  . insulin aspart  0-24 Units Subcutaneous Q4H  . mouth rinse  15 mL Mouth Rinse BID  . metoCLOPramide (REGLAN) injection  10 mg Intravenous Q6H  . metoprolol tartrate  12.5 mg Oral BID   Or  . metoprolol tartrate  12.5 mg Per Tube BID  . pantoprazole  40 mg Oral Daily  . potassium chloride (KCL MULTIRUN) 30 mEq in 265 mL IVPB  30 mEq Intravenous Once  . sodium chloride flush  3 mL Intravenous Q12H   Continuous Infusions: . sodium chloride Stopped (02/11/17 1100)  . sodium chloride Stopped (02/11/17 0600)  . sodium chloride Stopped (02/10/17 2200)  . dexmedetomidine (PRECEDEX) IV infusion Stopped (02/10/17 2200)  . DOPamine 3 mcg/kg/min (02/12/17 0600)  . lactated ringers 20 mL/hr at 02/12/17 0600  . lactated ringers 20 mL/hr at 02/12/17 0600  . milrinone Stopped (02/11/17 1000)  . nitroGLYCERIN Stopped (02/10/17 1615)  . phenylephrine (NEO-SYNEPHRINE) Adult infusion Stopped (02/11/17 2300)   PRN Meds:.sodium chloride, lactated ringers, metoprolol, midazolam, morphine injection, morphine injection, ondansetron (ZOFRAN) IV, oxyCODONE, sodium chloride flush, traMADol  General appearance: alert and cooperative Neurologic: intact Heart: regular rate and  rhythm, S1, S2 normal, no murmur, click, rub or gallop Lungs: diminished breath sounds RLL Abdomen: soft, non-tender; bowel sounds normal; no masses,  no organomegaly Extremities: extremities normal, atraumatic, no cyanosis or edema and Homans sign is negative, no sign of DVT Wound: sternum stable  Lab Results: CBC: Recent Labs  02/11/17 1803 02/12/17 0400  WBC 8.6 6.5  HGB 8.3* 7.9*  HCT 24.6* 24.0*  PLT 67* 56*   BMET:  Recent Labs  02/11/17 0423 02/11/17 1754 02/11/17 1803 02/12/17 0400  NA 139 141  --  137  K 4.1 4.1  --  3.9  CL 110 104  --  105  CO2 23  --   --  25  GLUCOSE 117* 125*  --  113*  BUN 6 8  --  7  CREATININE 0.83 0.70 0.84 0.80    CALCIUM 8.4*  --   --  8.5*    CMET: Lab Results  Component Value Date   WBC 6.5 02/12/2017   HGB 7.9 (L) 02/12/2017   HCT 24.0 (L) 02/12/2017   PLT 56 (L) 02/12/2017   GLUCOSE 113 (H) 02/12/2017   CHOL 139 05/29/2016   TRIG 85 05/29/2016   HDL 42 05/29/2016   LDLCALC 80 05/29/2016   ALT 27 02/09/2017   AST 37 02/09/2017   NA 137 02/12/2017   K 3.9 02/12/2017   CL 105 02/12/2017   CREATININE 0.80 02/12/2017   BUN 7 02/12/2017   CO2 25 02/12/2017   TSH 2.560 11/25/2015   INR 1.51 02/10/2017   HGBA1C 5.0 02/10/2017      PT/INR:  Recent Labs  02/10/17 1618  LABPROT 18.4*  INR 1.51   Radiology: No results found.   Assessment/Plan: S/P Procedure(s) (LRB): CORONARY ARTERY BYPASS GRAFTING (CABG), ON PUMP, TIMES THREE, USING LEFT INTERNAL MAMMARY ARTERY AND ENDOSCOPICALLY HARVESTED BILATERAL GREATER SAPHENOUS VEINS (N/A) TRANSESOPHAGEAL ECHOCARDIOGRAM (TEE) (N/A) Mobilize Diuresis d/c tubes/lines Continue foley due to strict I&O Thrombocytopenia- will hold heparin with plts dropping continue scd's Remains on low dose dopamine, some increase ri effusion, lasix today Pt consult     [name] 02/12/2017 7:28 AM

## 2017-02-13 ENCOUNTER — Inpatient Hospital Stay (HOSPITAL_COMMUNITY): Payer: 59

## 2017-02-13 ENCOUNTER — Encounter (HOSPITAL_COMMUNITY): Payer: Self-pay | Admitting: Certified Registered Nurse Anesthetist

## 2017-02-13 DIAGNOSIS — E876 Hypokalemia: Secondary | ICD-10-CM

## 2017-02-13 LAB — GLUCOSE, CAPILLARY
GLUCOSE-CAPILLARY: 90 mg/dL (ref 65–99)
GLUCOSE-CAPILLARY: 105 mg/dL — AB (ref 65–99)
GLUCOSE-CAPILLARY: 94 mg/dL (ref 65–99)
Glucose-Capillary: 101 mg/dL — ABNORMAL HIGH (ref 65–99)
Glucose-Capillary: 83 mg/dL (ref 65–99)
Glucose-Capillary: 99 mg/dL (ref 65–99)

## 2017-02-13 LAB — BPAM RBC
Blood Product Expiration Date: 201804192359
Blood Product Expiration Date: 201804252359
Blood Product Expiration Date: 201804252359
Blood Product Expiration Date: 201804262359
ISSUE DATE / TIME: 201804041235
ISSUE DATE / TIME: 201804041235
Unit Type and Rh: 1700
Unit Type and Rh: 1700
Unit Type and Rh: 1700
Unit Type and Rh: 1700

## 2017-02-13 LAB — TYPE AND SCREEN
ABO/RH(D): B NEG
Antibody Screen: NEGATIVE
Unit division: 0
Unit division: 0
Unit division: 0
Unit division: 0

## 2017-02-13 LAB — CBC
HCT: 24.5 % — ABNORMAL LOW (ref 39.0–52.0)
Hemoglobin: 7.9 g/dL — ABNORMAL LOW (ref 13.0–17.0)
MCH: 31.2 pg (ref 26.0–34.0)
MCHC: 32.2 g/dL (ref 30.0–36.0)
MCV: 96.8 fL (ref 78.0–100.0)
Platelets: 79 10*3/uL — ABNORMAL LOW (ref 150–400)
RBC: 2.53 MIL/uL — ABNORMAL LOW (ref 4.22–5.81)
RDW: 12.6 % (ref 11.5–15.5)
WBC: 7.5 10*3/uL (ref 4.0–10.5)

## 2017-02-13 LAB — BASIC METABOLIC PANEL
Anion gap: 12 (ref 5–15)
BUN: 11 mg/dL (ref 6–20)
CO2: 27 mmol/L (ref 22–32)
Calcium: 8.9 mg/dL (ref 8.9–10.3)
Chloride: 100 mmol/L — ABNORMAL LOW (ref 101–111)
Creatinine, Ser: 0.89 mg/dL (ref 0.61–1.24)
GFR calc Af Amer: >60 mL/min (ref >60)
GFR calc non Af Amer: >60 mL/min (ref >60)
Glucose, Bld: 106 mg/dL — ABNORMAL HIGH (ref 65–99)
Potassium: 3.4 mmol/L — ABNORMAL LOW (ref 3.5–5.1)
Sodium: 139 mmol/L (ref 135–145)

## 2017-02-13 LAB — HEPARIN INDUCED PLATELET AB (HIT ANTIBODY): Heparin Induced Plt Ab: 0.441 OD — ABNORMAL HIGH (ref 0.000–0.400)

## 2017-02-13 MED ORDER — POTASSIUM CHLORIDE 20 MEQ/15ML (10%) PO SOLN: 20.0000 meq | Freq: Four times a day (QID) | ORAL | Status: AC

## 2017-02-13 MED ORDER — SPIRONOLACTONE 25 MG PO TABS
12.5000 mg | ORAL_TABLET | Freq: Every day | ORAL | Status: DC
  Administered 2017-02-13 – 2017-02-18 (×6): 12.5 mg via ORAL
  Filled 2017-02-13 (×6): qty 1

## 2017-02-13 MED ORDER — FUROSEMIDE 10 MG/ML IJ SOLN
40.0000 mg | Freq: Once | INTRAMUSCULAR | Status: AC
  Administered 2017-02-13: 40 mg via INTRAVENOUS
  Filled 2017-02-13: qty 4

## 2017-02-13 MED ORDER — DOPAMINE-DEXTROSE 3.2-5 MG/ML-% IV SOLN
0.0000 ug/kg/min | INTRAVENOUS | Status: DC
  Filled 2017-02-13: qty 250

## 2017-02-13 MED ORDER — POTASSIUM CHLORIDE CRYS ER 20 MEQ PO TBCR
20.0000 meq | EXTENDED_RELEASE_TABLET | Freq: Four times a day (QID) | ORAL | Status: AC
  Administered 2017-02-13 (×2): 20 meq via ORAL
  Filled 2017-02-13 (×2): qty 1

## 2017-02-13 MED ORDER — CARVEDILOL 6.25 MG PO TABS
6.2500 mg | ORAL_TABLET | Freq: Two times a day (BID) | ORAL | Status: DC
  Administered 2017-02-13 – 2017-02-14 (×2): 6.25 mg via ORAL
  Filled 2017-02-13 (×2): qty 1

## 2017-02-13 NOTE — Progress Notes (Signed)
Patient ID: Seth Byrd, male   DOB: 24-Apr-1946, 71 y.o.   MRN: [id number] EVENING ROUNDS NOTE :     [address]       [place] [address]             [phone]                 3 Days Post-Op Procedure(s) (LRB): CORONARY ARTERY BYPASS GRAFTING (CABG), ON PUMP, TIMES THREE, USING LEFT INTERNAL MAMMARY ARTERY AND ENDOSCOPICALLY HARVESTED BILATERAL GREATER SAPHENOUS VEINS (N/A) TRANSESOPHAGEAL ECHOCARDIOGRAM (TEE) (N/A)  Total Length of Stay:  LOS: 4 days  BP 135/62   Pulse 80   Temp 97.9 F (36.6 C) (Oral)   Resp 16   Ht 5\' 11"  (1.803 m)   Wt 194 lb 3.6 oz (88.1 kg)   SpO2 98%   BMI 27.09 kg/m   .Intake/Output      04/07 0701 - 04/08 0700   P.O. 1080   I.V. (mL/kg) 40.6 (0.5)   IV Piggyback    Total Intake(mL/kg) 1120.6 (12.7)   Urine (mL/kg/hr) 925 (0.8)   Chest Tube    Total Output 925   Net +195.6         . sodium chloride Stopped (02/11/17 1100)  . sodium chloride Stopped (02/11/17 0600)  . sodium chloride Stopped (02/10/17 2200)  . DOPamine 2.906 mcg/kg/min (02/13/17 1805)  . nitroGLYCERIN Stopped (02/10/17 1615)  . phenylephrine (NEO-SYNEPHRINE) Adult infusion Stopped (02/11/17 2300)     Lab Results  Component Value Date   WBC 7.5 02/13/2017   HGB 7.9 (L) 02/13/2017   HCT 24.5 (L) 02/13/2017   PLT 79 (L) 02/13/2017   GLUCOSE 106 (H) 02/13/2017   CHOL 139 05/29/2016   TRIG 85 05/29/2016   HDL 42 05/29/2016   LDLCALC 80 05/29/2016   ALT 27 02/09/2017   AST 37 02/09/2017   NA 139 02/13/2017   K 3.4 (L) 02/13/2017   CL 100 (L) 02/13/2017   CREATININE 0.89 02/13/2017   BUN 11 02/13/2017   CO2 27 02/13/2017   TSH 2.560 11/25/2015   INR 1.51 02/10/2017   HGBA1C 5.0 02/10/2017   Not able to wean off dopamine, bp down when walked   [name] MD  Beeper [phone] Office [phone] 02/13/2017 7:46 PM

## 2017-02-13 NOTE — Progress Notes (Addendum)
Patient ID: Seth Byrd, male   DOB: 03-20-1946, 71 y.o.   MRN: [id number] TCTS DAILY ICU PROGRESS NOTE                   [address]            [place] [address]          [phone]   3 Days Post-Op Procedure(s) (LRB): CORONARY ARTERY BYPASS GRAFTING (CABG), ON PUMP, TIMES THREE, USING LEFT INTERNAL MAMMARY ARTERY AND ENDOSCOPICALLY HARVESTED BILATERAL GREATER SAPHENOUS VEINS (N/A) TRANSESOPHAGEAL ECHOCARDIOGRAM (TEE) (N/A)  Total Length of Stay:  LOS: 4 days   Subjective: Awake and alert in chair eating   Objective: Vital signs in last 24 hours: Temp:  [97.7 F (36.5 C)-98.8 F (37.1 C)] 98.4 F (36.9 C) (04/07 0755) Pulse Rate:  [76-98] 85 (04/07 0800) Cardiac Rhythm: Normal sinus rhythm (04/07 0400) Resp:  [16-33] 17 (04/07 0800) BP: (91-153)/(48-74) 110/67 (04/07 0800) SpO2:  [85 %-99 %] 98 % (04/07 0800) Arterial Line BP: (137)/(56) 137/56 (04/06 1000) Weight:  [194 lb 3.6 oz (88.1 kg)] 194 lb 3.6 oz (88.1 kg) (04/07 0500)  Filed Weights   02/11/17 0500 02/12/17 0500 02/13/17 0500  Weight: 201 lb 4.5 oz (91.3 kg) 198 lb 13.7 oz (90.2 kg) 194 lb 3.6 oz (88.1 kg)    Weight change: -4 lb 10.1 oz (-2.1 kg)   Hemodynamic parameters for last 24 hours: CVP:  [10 mmHg] 10 mmHg  Intake/Output from previous day: 04/06 0701 - 04/07 0700 In: 730 [I.V.:680; IV Piggyback:50] Out: 4193 [Urine:3475]  Intake/Output this shift: No intake/output data recorded.  Current Meds: Scheduled Meds: . acetaminophen  1,000 mg Oral Q6H   Or  . acetaminophen (TYLENOL) oral liquid 160 mg/5 mL  1,000 mg Per Tube Q6H  . aspirin EC  325 mg Oral Daily   Or  . aspirin  324 mg Per Tube Daily  . atorvastatin  40 mg Oral q1800  . bisacodyl  10 mg Oral Daily   Or  . bisacodyl  10 mg Rectal Daily  . docusate sodium  200 mg Oral Daily  . folic acid  1 mg Oral Daily  . insulin aspart  0-24 Units Subcutaneous Q4H  . mouth rinse  15 mL Mouth Rinse BID  . metoCLOPramide  (REGLAN) injection  10 mg Intravenous Q6H  . metoprolol tartrate  12.5 mg Oral BID   Or  . metoprolol tartrate  12.5 mg Per Tube BID  . pantoprazole  40 mg Oral Daily  . potassium chloride  20 mEq Oral Q6H   Or  . potassium chloride  20 mEq Oral Q6H  . potassium chloride (KCL MULTIRUN) 30 mEq in 265 mL IVPB  30 mEq Intravenous Once  . sodium chloride flush  3 mL Intravenous Q12H   Continuous Infusions: . sodium chloride Stopped (02/11/17 1100)  . sodium chloride Stopped (02/11/17 0600)  . sodium chloride Stopped (02/10/17 2200)  . dexmedetomidine (PRECEDEX) IV infusion Stopped (02/10/17 2200)  . DOPamine 3 mcg/kg/min (02/13/17 0700)  . lactated ringers 20 mL/hr at 02/13/17 0700  . lactated ringers Stopped (02/12/17 0900)  . milrinone Stopped (02/11/17 1000)  . nitroGLYCERIN Stopped (02/10/17 1615)  . phenylephrine (NEO-SYNEPHRINE) Adult infusion Stopped (02/11/17 2300)   PRN Meds:.sodium chloride, lactated ringers, metoprolol, midazolam, morphine injection, morphine injection, ondansetron (ZOFRAN) IV, oxyCODONE, sodium chloride flush, traMADol  General appearance: alert, cooperative and no distress Neurologic: intact Heart: regular rate and rhythm, S1, S2 normal,  no murmur, click, rub or gallop Lungs: diminished breath sounds bibasilar Abdomen: soft, non-tender; bowel sounds normal; no masses,  no organomegaly Extremities: extremities normal, atraumatic, no cyanosis or edema and Homans sign is negative, no sign of DVT Wound: sternum stable  Lab Results: CBC: Recent Labs  02/12/17 0400 02/13/17 0415  WBC 6.5 7.5  HGB 7.9* 7.9*  HCT 24.0* 24.5*  PLT 56* 79*   BMET:  Recent Labs  02/12/17 0400 02/13/17 0415  NA 137 139  K 3.9 3.4*  CL 105 100*  CO2 25 27  GLUCOSE 113* 106*  BUN 7 11  CREATININE 0.80 0.89  CALCIUM 8.5* 8.9    CMET: Lab Results  Component Value Date   WBC 7.5 02/13/2017   HGB 7.9 (L) 02/13/2017   HCT 24.5 (L) 02/13/2017   PLT 79 (L)  02/13/2017   GLUCOSE 106 (H) 02/13/2017   CHOL 139 05/29/2016   TRIG 85 05/29/2016   HDL 42 05/29/2016   LDLCALC 80 05/29/2016   ALT 27 02/09/2017   AST 37 02/09/2017   NA 139 02/13/2017   K 3.4 (L) 02/13/2017   CL 100 (L) 02/13/2017   CREATININE 0.89 02/13/2017   BUN 11 02/13/2017   CO2 27 02/13/2017   TSH 2.560 11/25/2015   INR 1.51 02/10/2017   HGBA1C 5.0 02/10/2017      PT/INR:  Recent Labs  02/10/17 1618  LABPROT 18.4*  INR 1.51   Radiology: Dg Chest Port 1 View  Result Date: 02/13/2017 CLINICAL DATA:  Chest tube in place EXAM: PORTABLE CHEST 1 VIEW COMPARISON:  Yesterday FINDINGS: Marked cardiomegaly. Status post CABG which shows tortuosity of the aorta. Sheath or dialysis catheter on the right with tip at the SVC level. Left-sided chest tube has been removed.  No visible pneumothorax. Increased hazy opacification of the right chest. Hazy density at the left base. IMPRESSION: 1. Removed chest tube with no visible pneumothorax. 2. Increased hazy density of the right chest, likely interval layering of pleural fluid. Smaller pleural effusion on the left. Underlying atelectasis or pneumonia would be obscured. Electronically Signed   By: Seth Byrd M.D.   On: 02/13/2017 07:17     Assessment/Plan: S/P Procedure(s) (LRB): CORONARY ARTERY BYPASS GRAFTING (CABG), ON PUMP, TIMES THREE, USING LEFT INTERNAL MAMMARY ARTERY AND ENDOSCOPICALLY HARVESTED BILATERAL GREATER SAPHENOUS VEINS (N/A) TRANSESOPHAGEAL ECHOCARDIOGRAM (TEE) (N/A) Mobilize Diuresis d/c tubes/lines Wean  Off dopamine D/c foley  kcl replaced  Some increase in right pleural effusion HIT pending,plts increasing off heparin  Seth Byrd 02/13/2017 8:58 AM

## 2017-02-13 NOTE — Progress Notes (Addendum)
   02/13/17 1151  Vitals  BP (!) 66/51  MAP (mmHg) (!) 57  Pulse Rate 90  ECG Heart Rate 86  Resp (!) 22  Oxygen Therapy  SpO2 100 %  pt was up walking with PT, DOPamine restarted

## 2017-02-13 NOTE — Progress Notes (Addendum)
Advanced Heart Failure Rounding Note   Subjective:    02/10/17 S/P CABG x3   All drips weaned off except dopamine 3 mcg.   Feel good. Was up in chair. Denies SOB or orthopnea. Receiving LR fluids. Diuresed on IV lasix. Weight down 4 pounds (still up 6 pounds from baseline)   Passing gas. Chest sore.   Objective:   Weight Range:  Vital Signs:   Temp:  [98.2 F (36.8 C)-98.8 F (37.1 C)] 98.4 F (36.9 C) (04/07 1141) Pulse Rate:  [80-98] 85 (04/07 0800) Resp:  [16-33] 17 (04/07 0800) BP: (91-153)/(48-74) 110/67 (04/07 0800) SpO2:  [85 %-99 %] 98 % (04/07 0800) Weight:  [88.1 kg (194 lb 3.6 oz)] 88.1 kg (194 lb 3.6 oz) (04/07 0500) Last BM Date: 02/08/17  Weight change: Filed Weights   02/11/17 0500 02/12/17 0500 02/13/17 0500  Weight: 91.3 kg (201 lb 4.5 oz) 90.2 kg (198 lb 13.7 oz) 88.1 kg (194 lb 3.6 oz)    Intake/Output:   Intake/Output Summary (Last 24 hours) at 02/13/17 1153 Last data filed at 02/13/17 0700  Gross per 24 hour  Intake            570.8 ml  Output             2825 ml  Net          -2254.2 ml     Physical Exam: General: Lying in bed. NAD HEENT: normal. anicteric Neck: supple. JVP 10. Carotids 2+ bilat; no bruits. No lymphadenopathy or thryomegaly appreciated. RIJ Swan.  Cor: PMI nondisplaced. RRR.  No rubs, gallops or murmurs. Sternal dressing c/d/i Lungs: clear anteriorly Abdomen: soft, NT, mildly distended  No hepatosplenomegaly. No bruits or masses. Good BS Extremities: no cyanosis, clubbing, rash, 1+ edema Neuro: A&O x3. cranial nerves grossly intact. MAE x4.  Affect pleasant Skin- Sternal dressing intact. Clean. GU: Condom cath  Telemetry:  NSR 80s. Personally reviewed  Labs: Basic Metabolic Panel:  Recent Labs Lab 02/09/17 0037 02/10/17 0033  02/10/17 2211 02/10/17 2217 02/11/17 0423 02/11/17 1754 02/11/17 1803 02/12/17 0400 02/13/17 0415  NA 138 137  < >  --  141 139 141  --  137 139  K 3.4* 3.4*  < >  --  4.0 4.1 4.1   --  3.9 3.4*  CL 108 105  < >  --  106 110 104  --  105 100*  CO2 23 24  --   --   --  23  --   --  25 27  GLUCOSE 140* 101*  < >  --  154* 117* 125*  --  113* 106*  BUN 16 12  < >  --  7 6 8   --  7 11  CREATININE 1.11 0.86  < > 0.86 0.80 0.83 0.70 0.84 0.80 0.89  CALCIUM 9.4 9.1  --   --   --  8.4*  --   --  8.5* 8.9  MG  --  1.7  --  2.5*  --  2.0  --  2.2  --   --   < > = values in this interval not displayed.  Liver Function Tests:  Recent Labs Lab 02/09/17 0037  AST 37  ALT 27  ALKPHOS 66  BILITOT 1.0  PROT 7.4  ALBUMIN 3.8   No results for input(s): LIPASE, AMYLASE in the last 168 hours. No results for input(s): AMMONIA in the last 168 hours.  CBC:  Recent Labs  Lab 02/09/17 0037 02/10/17 0033  02/10/17 2211  02/11/17 0423 02/11/17 1754 02/11/17 1803 02/12/17 0400 02/13/17 0415  WBC 5.4 5.5  < > 8.4  --  7.6  --  8.6 6.5 7.5  NEUTROABS 3.5 2.6  --   --   --   --   --   --   --   --   HGB 13.5 12.9*  < > 9.5*  < > 8.9* 7.8* 8.3* 7.9* 7.9*  HCT 39.2* 37.5*  < > 28.7*  < > 26.3* 23.0* 24.6* 24.0* 24.5*  MCV 94.9 94.5  < > 95.0  --  94.3  --  95.3 96.0 96.8  PLT 138* 121*  < > 92*  --  93*  --  67* 56* 79*  < > = values in this interval not displayed.  Cardiac Enzymes:  Recent Labs Lab 02/09/17 0037 02/09/17 0340 02/09/17 0644  TROPONINI 0.05* 0.54* 1.50*    BNP: BNP (last 3 results) No results for input(s): BNP in the last 8760 hours.  ProBNP (last 3 results) No results for input(s): PROBNP in the last 8760 hours.    Other results:  Imaging: Dg Chest Port 1 View  Result Date: 02/13/2017 CLINICAL DATA:  Chest tube in place EXAM: PORTABLE CHEST 1 VIEW COMPARISON:  Yesterday FINDINGS: Marked cardiomegaly. Status post CABG which shows tortuosity of the aorta. Sheath or dialysis catheter on the right with tip at the SVC level. Left-sided chest tube has been removed.  No visible pneumothorax. Increased hazy opacification of the right chest. Hazy  density at the left base. IMPRESSION: 1. Removed chest tube with no visible pneumothorax. 2. Increased hazy density of the right chest, likely interval layering of pleural fluid. Smaller pleural effusion on the left. Underlying atelectasis or pneumonia would be obscured. Electronically Signed   By: Monte Fantasia M.D.   On: 02/13/2017 07:17   Dg Chest Port 1 View  Result Date: 02/12/2017 CLINICAL DATA:  Chest soreness. EXAM: PORTABLE CHEST 1 VIEW COMPARISON:  02/11/2017. FINDINGS: Interim removal of Swan-Ganz catheter. Right IJ sheath in stable position. Left chest tube in stable position. No pneumothorax. Prior CABG. Persistent cardiomegaly. Increasing bibasilar infiltrates/edema. Low lung volumes. Interim increase in right pleural effusion. Stable tiny left pleural effusion cannot be excluded. No acute bony abnormality . IMPRESSION: 1. Interim removal of Swan-Ganz catheter. Right IJ sheath and left chest tube in stable position. No pneumothorax. 2. Prior CABG. Progressive changes of congestive heart failure with basilar pulmonary edema and progressive right pleural effusion . Low lung volumes. Electronically Signed   By: Marcello Moores  Register   On: 02/12/2017 08:50     Medications:     Scheduled Medications: . acetaminophen  1,000 mg Oral Q6H   Or  . acetaminophen (TYLENOL) oral liquid 160 mg/5 mL  1,000 mg Per Tube Q6H  . aspirin EC  325 mg Oral Daily   Or  . aspirin  324 mg Per Tube Daily  . atorvastatin  40 mg Oral q1800  . bisacodyl  10 mg Oral Daily   Or  . bisacodyl  10 mg Rectal Daily  . carvedilol  6.25 mg Oral BID WC  . docusate sodium  200 mg Oral Daily  . folic acid  1 mg Oral Daily  . insulin aspart  0-24 Units Subcutaneous Q4H  . mouth rinse  15 mL Mouth Rinse BID  . metoCLOPramide (REGLAN) injection  10 mg Intravenous Q6H  . pantoprazole  40 mg Oral Daily  .  potassium chloride  20 mEq Oral Q6H   Or  . potassium chloride  20 mEq Oral Q6H  . potassium chloride (KCL MULTIRUN)  30 mEq in 265 mL IVPB  30 mEq Intravenous Once  . sodium chloride flush  3 mL Intravenous Q12H    Infusions: . sodium chloride Stopped (02/11/17 1100)  . sodium chloride Stopped (02/11/17 0600)  . sodium chloride Stopped (02/10/17 2200)  . dexmedetomidine (PRECEDEX) IV infusion Stopped (02/10/17 2200)  . DOPamine 3 mcg/kg/min (02/13/17 0700)  . lactated ringers 20 mL/hr at 02/13/17 0700  . lactated ringers Stopped (02/12/17 0900)  . nitroGLYCERIN Stopped (02/10/17 1615)  . phenylephrine (NEO-SYNEPHRINE) Adult infusion Stopped (02/11/17 2300)    PRN Medications: sodium chloride, lactated ringers, metoprolol, midazolam, morphine injection, morphine injection, ondansetron (ZOFRAN) IV, oxyCODONE, sodium chloride flush, traMADol   Assessment/Plan/Discussion    1. CAD- POD #3 S/P CABG x3.  2. Acute Systolic Heart Failure- ICM. ECHO EF ~25%.  Tomorrow would add low dose spiro.  3. Hyperlipidemia- on statin  4. Arthritis 5. Anemia 6. Thrombocytopenia  Doing well post op. Volume status still up a bit. Continue to diurese. Will give another dose of IV lasix today. Stop LR. Continue b-blocker. Add low dose losartan tomorrow.   Platelets coming up. No bleeding.   BP stable. Wean dopamine. Renal function normal.   K is low. Will supp.   Maintaining NSR  Continue to mobilize  Care d/w Dr. Servando Snare at bedside.  Length of Stay: 4 Bensimhon, Daniel MD 02/13/2017, 11:53 AM  Advanced Heart Failure Team Pager [phone] (M-F; 7a - 4p)  Please contact [facility] for night-coverage after hours (4p -7a ) and weekends on [email or web address]

## 2017-02-13 NOTE — Evaluation (Signed)
Physical Therapy Evaluation Patient Details Name: Seth Byrd MRN: [id number] DOB: Aug 19, 1946 Today's Date: 02/13/2017   History of Present Illness   Seth Byrd is a 71 year old AA male with a history of HTN, RA,prostate CA, hyperlipidemia, and former smoker.  Admitted 02/09/17 with NSTEMI, now s/p CABG x 3 on 02/10/17.  RN reports difficulty with mobility.  Clinical Impression  Patient presents with decreased independence with mobility due to poor activity tolerance, decreased balance, general weakness and will benefit from skilled PT in the acute setting to allow return home with family support following SNF level rehab stay.  Patient limited this session with dropping BP after in hallway ambulation.  Family also reports would be able to provide assist, but feel pt's current needs are beyond their capability to handle at home.  PT to follow.     Follow Up Recommendations SNF    Equipment Recommendations  None recommended by PT    Recommendations for Other Services       Precautions / Restrictions Precautions Precautions: Fall;Sternal Restrictions Weight Bearing Restrictions: Yes (Sternal precautions)      Mobility  Bed Mobility Overal bed mobility: Needs Assistance Bed Mobility: Rolling;Sidelying to Sit;Sit to Supine Rolling: Mod assist Sidelying to sit: Max assist   Sit to supine: Mod assist;+2 for physical assistance   General bed mobility comments: assist for legs off bed and for trunk upright, cues for technique with sternal precautions  Transfers Overall transfer level: Needs assistance Equipment used: Rolling walker (2 wheeled) Transfers: Sit to/from Stand Sit to Stand: Mod assist;+2 safety/equipment;+2 physical assistance;From elevated surface         General transfer comment: lifting assist from EOB, increased time, cues for technique  Ambulation/Gait Ambulation/Gait assistance: Mod assist;+2 safety/equipment Ambulation Distance (Feet): 22 Feet Assistive  device: Rolling walker (2 wheeled) Gait Pattern/deviations: Step-to pattern;Step-through pattern;Decreased stride length     General Gait Details: slow pace, assist to guide walker with heavy UE support, cues throughout for posture, assist for IV/O2, monitor.  Sat to rest in hallway, prep to stand and walk back, but pt diaphoretic, BP checked was 66/51, returned to room in chair and pivot to bed, back to supine and pt slow to respond, but responsive throughout. RN aware and in to assess.   Stairs            Wheelchair Mobility    Modified Rankin (Stroke Patients Only)       Balance Overall balance assessment: Needs assistance Sitting-balance support: Feet supported Sitting balance-Leahy Scale: Poor Sitting balance - Comments: min to mod A for balance at EOB, leaning to R and posterior; seated trunk work for cervical rotation, lumbo pelvic lateral and ant/post movements   Standing balance support: Bilateral upper extremity supported Standing balance-Leahy Scale: Poor Standing balance comment: UE support and assist for balance                             Pertinent Vitals/Pain Pain Assessment: Faces Pain Score: 7  Pain Location: abdomen with mobility Pain Descriptors / Indicators: Aching;Discomfort;Guarding Pain Intervention(s): Monitored during session;Repositioned    Home Living Family/patient expects to be discharged to:: Skilled nursing facility Living Arrangements: Alone Available Help at Discharge: Family;Available PRN/intermittently Type of Home: House Home Access: Stairs to enter   Entrance Stairs-Number of Steps: 1 Home Layout: One level Home Equipment: Walker - 2 wheels;Cane - single point;Bedside commode      Prior Function Level of Independence: Independent  Comments: was working PTA     Hand Dominance   Dominant Hand: Right    Extremity/Trunk Assessment   Upper Extremity Assessment Upper Extremity Assessment: LUE  deficits/detail;RUE deficits/detail RUE Deficits / Details: moves within ROM to shoulder level (sternal precautions) LUE Deficits / Details: moves within ROM to shoulder level (sternal precautions)    Lower Extremity Assessment Lower Extremity Assessment: Generalized weakness       Communication   Communication: No difficulties  Cognition Arousal/Alertness: Awake/alert Behavior During Therapy: WFL for tasks assessed/performed Overall Cognitive Status: Within Functional Limits for tasks assessed                                        General Comments      Exercises     Assessment/Plan    PT Assessment Patient needs continued PT services  PT Problem List Decreased activity tolerance;Decreased strength;Decreased mobility;Decreased knowledge of precautions;Cardiopulmonary status limiting activity;Decreased knowledge of use of DME;Decreased balance;Pain       PT Treatment Interventions DME instruction;Gait training;Functional mobility training;Balance training;Patient/family education;Therapeutic exercise;Therapeutic activities    PT Goals (Current goals can be found in the Care Plan section)  Acute Rehab PT Goals Patient Stated Goal: To return to independent PT Goal Formulation: With patient/family Time For Goal Achievement: 02/27/17 Potential to Achieve Goals: Good    Frequency Min 3X/week   Barriers to discharge Decreased caregiver support      Co-evaluation               End of Session Equipment Utilized During Treatment: Gait belt;Oxygen Activity Tolerance: Treatment limited secondary to medical complications (Comment) (decreased BP ) Patient left: in bed;with call bell/phone within reach;with family/visitor present Nurse Communication: Mobility status;Other (comment) (low BP) PT Visit Diagnosis: Muscle weakness (generalized) (M62.81);Other abnormalities of gait and mobility (R26.89)    Time: 1410-3013 PT Time Calculation (min) (ACUTE  ONLY): 32 min   Charges:   PT Evaluation $PT Eval High Complexity: 1 Procedure PT Treatments $Gait Training: 8-22 mins   PT G CodesMagda Kiel, [place] [phone] 02/13/2017   Reginia Naas 02/13/2017, 2:21 PM

## 2017-02-14 ENCOUNTER — Inpatient Hospital Stay (HOSPITAL_COMMUNITY): Payer: 59

## 2017-02-14 DIAGNOSIS — J90 Pleural effusion, not elsewhere classified: Secondary | ICD-10-CM

## 2017-02-14 LAB — GLUCOSE, CAPILLARY
GLUCOSE-CAPILLARY: 113 mg/dL — AB (ref 65–99)
GLUCOSE-CAPILLARY: 91 mg/dL (ref 65–99)
GLUCOSE-CAPILLARY: 94 mg/dL (ref 65–99)
GLUCOSE-CAPILLARY: 95 mg/dL (ref 65–99)
Glucose-Capillary: 77 mg/dL (ref 65–99)
Glucose-Capillary: 90 mg/dL (ref 65–99)

## 2017-02-14 LAB — BASIC METABOLIC PANEL
Anion gap: 9 (ref 5–15)
BUN: 13 mg/dL (ref 6–20)
CO2: 28 mmol/L (ref 22–32)
Calcium: 8.7 mg/dL — ABNORMAL LOW (ref 8.9–10.3)
Chloride: 102 mmol/L (ref 101–111)
Creatinine, Ser: 0.84 mg/dL (ref 0.61–1.24)
GFR calc Af Amer: >60 mL/min (ref >60)
GFR calc non Af Amer: >60 mL/min (ref >60)
Glucose, Bld: 102 mg/dL — ABNORMAL HIGH (ref 65–99)
Potassium: 3.3 mmol/L — ABNORMAL LOW (ref 3.5–5.1)
Sodium: 139 mmol/L (ref 135–145)

## 2017-02-14 LAB — CBC
HCT: 23.1 % — ABNORMAL LOW (ref 39.0–52.0)
Hemoglobin: 7.8 g/dL — ABNORMAL LOW (ref 13.0–17.0)
MCH: 32.6 pg (ref 26.0–34.0)
MCHC: 33.8 g/dL (ref 30.0–36.0)
MCV: 96.7 fL (ref 78.0–100.0)
Platelets: 120 10*3/uL — ABNORMAL LOW (ref 150–400)
RBC: 2.39 MIL/uL — ABNORMAL LOW (ref 4.22–5.81)
RDW: 12.9 % (ref 11.5–15.5)
WBC: 6.1 10*3/uL (ref 4.0–10.5)

## 2017-02-14 MED ORDER — SODIUM CHLORIDE 0.9% FLUSH: 10.0000 mL | INTRAVENOUS | Status: DC | PRN

## 2017-02-14 MED ORDER — CHLORHEXIDINE GLUCONATE CLOTH 2 % EX PADS
6.0000 | MEDICATED_PAD | Freq: Every day | CUTANEOUS | Status: DC
  Administered 2017-02-15: 6 via TOPICAL

## 2017-02-14 MED ORDER — FUROSEMIDE 10 MG/ML IJ SOLN
40.0000 mg | Freq: Two times a day (BID) | INTRAMUSCULAR | Status: DC
  Administered 2017-02-14 (×2): 40 mg via INTRAVENOUS
  Filled 2017-02-14 (×2): qty 4

## 2017-02-14 MED ORDER — POTASSIUM CHLORIDE CRYS ER 20 MEQ PO TBCR
40.0000 meq | EXTENDED_RELEASE_TABLET | Freq: Once | ORAL | Status: AC
  Administered 2017-02-14: 40 meq via ORAL
  Filled 2017-02-14: qty 2

## 2017-02-14 MED ORDER — POTASSIUM CHLORIDE 20 MEQ/15ML (10%) PO SOLN: 20.0000 meq | Freq: Four times a day (QID) | ORAL | Status: AC

## 2017-02-14 MED ORDER — CARVEDILOL 3.125 MG PO TABS
3.1250 mg | ORAL_TABLET | Freq: Two times a day (BID) | ORAL | Status: DC
  Administered 2017-02-14 – 2017-02-19 (×10): 3.125 mg via ORAL
  Filled 2017-02-14 (×11): qty 1

## 2017-02-14 MED ORDER — SODIUM CHLORIDE 0.9% FLUSH
10.0000 mL | Freq: Two times a day (BID) | INTRAVENOUS | Status: DC
Start: 2017-02-14 — End: 2017-02-16
  Administered 2017-02-14 – 2017-02-15 (×3): 10 mL

## 2017-02-14 MED ORDER — POTASSIUM CHLORIDE CRYS ER 20 MEQ PO TBCR
20.0000 meq | EXTENDED_RELEASE_TABLET | Freq: Four times a day (QID) | ORAL | Status: AC
  Administered 2017-02-14: 20 meq via ORAL
  Filled 2017-02-14 (×2): qty 1

## 2017-02-14 MED ORDER — CHLORHEXIDINE GLUCONATE CLOTH 2 % EX PADS: 6.0000 | MEDICATED_PAD | Freq: Every day | CUTANEOUS | Status: DC

## 2017-02-14 NOTE — Progress Notes (Signed)
Patient ID: Seth Byrd, male   DOB: 1946/10/22, 71 y.o.   MRN: [id number] EVENING ROUNDS NOTE :     [address]       [place] [address]             [phone]                 4 Days Post-Op Procedure(s) (LRB): CORONARY ARTERY BYPASS GRAFTING (CABG), ON PUMP, TIMES THREE, USING LEFT INTERNAL MAMMARY ARTERY AND ENDOSCOPICALLY HARVESTED BILATERAL GREATER SAPHENOUS VEINS (N/A) TRANSESOPHAGEAL ECHOCARDIOGRAM (TEE) (N/A)  Total Length of Stay:  LOS: 5 days  BP 128/68   Pulse 76   Temp 98.1 F (36.7 C) (Oral)   Resp 15   Ht 5\' 11"  (1.803 m)   Wt 192 lb 7.4 oz (87.3 kg)   SpO2 100%   BMI 26.84 kg/m   .Intake/Output      04/07 0701 - 04/08 0700 04/08 0701 - 04/09 0700   P.O. 1080 240   I.V. (mL/kg) 92.4 (1.1) 308 (3.5)   IV Piggyback     Total Intake(mL/kg) 1172.4 (13.4) 548 (6.3)   Urine (mL/kg/hr) 1625 (0.8) 625 (0.7)   Stool 0 (0)    Chest Tube     Total Output 1625 625   Net -452.6 -77        Urine Occurrence 3 x    Stool Occurrence 0 x      . sodium chloride Stopped (02/11/17 1100)  . sodium chloride Stopped (02/11/17 0600)  . sodium chloride Stopped (02/10/17 2200)  . DOPamine Stopped (02/14/17 1100)  . nitroGLYCERIN Stopped (02/10/17 1615)  . phenylephrine (NEO-SYNEPHRINE) Adult infusion Stopped (02/11/17 2300)     Lab Results  Component Value Date   WBC 6.1 02/14/2017   HGB 7.8 (L) 02/14/2017   HCT 23.1 (L) 02/14/2017   PLT 120 (L) 02/14/2017   GLUCOSE 102 (H) 02/14/2017   CHOL 139 05/29/2016   TRIG 85 05/29/2016   HDL 42 05/29/2016   LDLCALC 80 05/29/2016   ALT 27 02/09/2017   AST 37 02/09/2017   NA 139 02/14/2017   K 3.3 (L) 02/14/2017   CL 102 02/14/2017   CREATININE 0.84 02/14/2017   BUN 13 02/14/2017   CO2 28 02/14/2017   TSH 2.560 11/25/2015   INR 1.51 02/10/2017   HGBA1C 5.0 02/10/2017   Rep[lacing kcl Now off dopamine  1625 uop    [name] MD  Beeper [phone] Office [phone] 02/14/2017 5:49  PM

## 2017-02-14 NOTE — Progress Notes (Signed)
HIT antibody barely + 0.44 with ULN 0.4  PLTC improved 130>56> 120 off heparin/enoxaparin 4Ts score 3 low possibility of HIT/thrombosis - no need for [facility] at this time No need to confirm with [place].D. CPP, BCPS Clinical Pharmacist [phone] 02/14/2017 12:23 PM

## 2017-02-14 NOTE — Progress Notes (Signed)
Advanced Heart Failure Rounding Note   Subjective:    02/10/17 S/P CABG x3   Dopamine weaned off yesterday but now back on at 3. Weaning again.   Feels good. Chest mildly sore.  Received IV lasix x 2 yesterday. Weight down 2 pounds. Breathing better. CXR viewed personally. Moderate R effusion that is improving   Passing gas. No BM yet  HIT ab +. Off heparin. PLTs improving. No bleeding.   Objective:   Weight Range:  Vital Signs:   Temp:  [97.9 F (36.6 C)-98.7 F (37.1 C)] 98.7 F (37.1 C) (04/08 0800) Pulse Rate:  [61-90] 77 (04/08 0700) Resp:  [12-22] 14 (04/08 0700) BP: (66-153)/(46-82) 131/71 (04/08 0700) SpO2:  [93 %-100 %] 100 % (04/08 0700) Weight:  [87.3 kg (192 lb 7.4 oz)] 87.3 kg (192 lb 7.4 oz) (04/08 0500) Last BM Date: 02/08/17  Weight change: Filed Weights   02/12/17 0500 02/13/17 0500 02/14/17 0500  Weight: 90.2 kg (198 lb 13.7 oz) 88.1 kg (194 lb 3.6 oz) 87.3 kg (192 lb 7.4 oz)    Intake/Output:   Intake/Output Summary (Last 24 hours) at 02/14/17 1103 Last data filed at 02/14/17 0700  Gross per 24 hour  Intake           812.36 ml  Output             1350 ml  Net          -537.64 ml     Physical Exam: General: Sitting in chair. NAD HEENT: normal. anicteric Neck: supple. JVP 9  Carotids 2+ bilat; no bruits. No lymphadenopathy or thryomegaly appreciated. RIJ Swan.  Cor: PMI nondisplaced. RRR  No m/r/g Sternal dressing c/d/i Lungs: clear. Decreased R 1/3 up Abdomen: soft, NT, nondistended  No hepatosplenomegaly. No bruits or masses. Good BS Extremities: no cyanosis, clubbing, rash, trace edema Neuro: A&O x3. cranial nerves grossly intact. MAE x4.  Affect pleasant Skin- Sternal dressing intact. Clean. GU: Condom cath  Telemetry:  NSR 70-80s. Personally reviewed   Labs: Basic Metabolic Panel:  Recent Labs Lab 02/10/17 0033  02/10/17 2211  02/11/17 0423 02/11/17 1754 02/11/17 1803 02/12/17 0400 02/13/17 0415 02/14/17 0415  NA 137   < >  --   < > 139 141  --  137 139 139  K 3.4*  < >  --   < > 4.1 4.1  --  3.9 3.4* 3.3*  CL 105  < >  --   < > 110 104  --  105 100* 102  CO2 24  --   --   --  23  --   --  25 27 28   GLUCOSE 101*  < >  --   < > 117* 125*  --  113* 106* 102*  BUN 12  < >  --   < > 6 8  --  7 11 13   CREATININE 0.86  < > 0.86  < > 0.83 0.70 0.84 0.80 0.89 0.84  CALCIUM 9.1  --   --   --  8.4*  --   --  8.5* 8.9 8.7*  MG 1.7  --  2.5*  --  2.0  --  2.2  --   --   --   < > = values in this interval not displayed.  Liver Function Tests:  Recent Labs Lab 02/09/17 0037  AST 37  ALT 27  ALKPHOS 66  BILITOT 1.0  PROT 7.4  ALBUMIN 3.8  No results for input(s): LIPASE, AMYLASE in the last 168 hours. No results for input(s): AMMONIA in the last 168 hours.  CBC:  Recent Labs Lab 02/09/17 0037 02/10/17 0033  02/10/17 2211  02/11/17 0423 02/11/17 1754 02/11/17 1803 02/12/17 0400 02/13/17 0415  WBC 5.4 5.5  < > 8.4  --  7.6  --  8.6 6.5 7.5  NEUTROABS 3.5 2.6  --   --   --   --   --   --   --   --   HGB 13.5 12.9*  < > 9.5*  < > 8.9* 7.8* 8.3* 7.9* 7.9*  HCT 39.2* 37.5*  < > 28.7*  < > 26.3* 23.0* 24.6* 24.0* 24.5*  MCV 94.9 94.5  < > 95.0  --  94.3  --  95.3 96.0 96.8  PLT 138* 121*  < > 92*  --  93*  --  67* 56* 79*  < > = values in this interval not displayed.  Cardiac Enzymes:  Recent Labs Lab 02/09/17 0037 02/09/17 0340 02/09/17 0644  TROPONINI 0.05* 0.54* 1.50*    BNP: BNP (last 3 results) No results for input(s): BNP in the last 8760 hours.  ProBNP (last 3 results) No results for input(s): PROBNP in the last 8760 hours.    Other results:  Imaging: Dg Chest Port 1 View  Result Date: 02/14/2017 CLINICAL DATA:  Chest tube in place. EXAM: PORTABLE CHEST 1 VIEW COMPARISON:  February 13, 2017 FINDINGS: Stable cardiomegaly. A right IJ sheath remains. No chest tubes are identified. No pneumothorax. Tiny left layering effusion suspected. Moderate layering effusion on the right is less  prominent in the interval. No other changes. IMPRESSION: Possible tiny left effusion. The moderate right effusion appear smaller in the interval. No other interval change. Electronically Signed   By: Dorise Bullion III M.D   On: 02/14/2017 07:01   Dg Chest Port 1 View  Result Date: 02/13/2017 CLINICAL DATA:  Chest tube in place EXAM: PORTABLE CHEST 1 VIEW COMPARISON:  Yesterday FINDINGS: Marked cardiomegaly. Status post CABG which shows tortuosity of the aorta. Sheath or dialysis catheter on the right with tip at the SVC level. Left-sided chest tube has been removed.  No visible pneumothorax. Increased hazy opacification of the right chest. Hazy density at the left base. IMPRESSION: 1. Removed chest tube with no visible pneumothorax. 2. Increased hazy density of the right chest, likely interval layering of pleural fluid. Smaller pleural effusion on the left. Underlying atelectasis or pneumonia would be obscured. Electronically Signed   By: Monte Fantasia M.D.   On: 02/13/2017 07:17     Medications:     Scheduled Medications: . acetaminophen  1,000 mg Oral Q6H   Or  . acetaminophen (TYLENOL) oral liquid 160 mg/5 mL  1,000 mg Per Tube Q6H  . aspirin EC  325 mg Oral Daily   Or  . aspirin  324 mg Per Tube Daily  . atorvastatin  40 mg Oral q1800  . bisacodyl  10 mg Oral Daily   Or  . bisacodyl  10 mg Rectal Daily  . carvedilol  6.25 mg Oral BID WC  . docusate sodium  200 mg Oral Daily  . folic acid  1 mg Oral Daily  . insulin aspart  0-24 Units Subcutaneous Q4H  . mouth rinse  15 mL Mouth Rinse BID  . metoCLOPramide (REGLAN) injection  10 mg Intravenous Q6H  . pantoprazole  40 mg Oral Daily  . potassium chloride  20  mEq Oral Q6H   Or  . potassium chloride  20 mEq Oral Q6H  . potassium chloride (KCL MULTIRUN) 30 mEq in 265 mL IVPB  30 mEq Intravenous Once  . sodium chloride flush  3 mL Intravenous Q12H  . spironolactone  12.5 mg Oral Daily    Infusions: . sodium chloride Stopped  (02/11/17 1100)  . sodium chloride Stopped (02/11/17 0600)  . sodium chloride Stopped (02/10/17 2200)  . DOPamine 1 mcg/kg/min (02/14/17 1016)  . nitroGLYCERIN Stopped (02/10/17 1615)  . phenylephrine (NEO-SYNEPHRINE) Adult infusion Stopped (02/11/17 2300)    PRN Medications: sodium chloride, metoprolol, midazolam, morphine injection, morphine injection, ondansetron (ZOFRAN) IV, oxyCODONE, sodium chloride flush, traMADol   Assessment/Plan/Discussion    1. CAD- POD S/P CABG x3. On 4/5 2. Acute Systolic Heart Failure- ICM. ECHO EF ~25%.  3. Hyperlipidemia 4. Arthritis 5. Anemia 6. Thrombocytopenia 7. Hypokalemia 8. R pleural effusion    Doing well post-op Volume still up about 4 pounds with moderate R pleural effusion which is improving with diuresis. Renal function stable. Co-ox 57%. Continue iv lasix 40 bid today. Supp K. Continue spiro. Will wean dopamine today. Add losartan tomorrow. Cut carvedilol to 3.125 bid.   Hit AB weakly +. Off heparin. Platelets coming up. No bleeding. Discussed with PharmD  Hgb stable at 7.8  Maintaining NSR.   Repeat CXR in am to reassess effusion   Continue to mobilize   Length of Stay: 5 Zakhia Seres MD 02/14/2017, 11:03 AM  Advanced Heart Failure Team Pager [phone] (M-F; 7a - 4p)  Please contact [facility] for night-coverage after hours (4p -7a ) and weekends on [email or web address]

## 2017-02-14 NOTE — Progress Notes (Signed)
Patient ID: Branndon Tuite, male   DOB: April 19, 1946, 71 y.o.   MRN: [id number] Patient ID: Sanjiv Castorena, male   DOB: 05/11/1946, 71 y.o.   MRN: [id number] TCTS DAILY ICU PROGRESS NOTE                   [address]            [place] [address]          [phone]   4 Days Post-Op Procedure(s) (LRB): CORONARY ARTERY BYPASS GRAFTING (CABG), ON PUMP, TIMES THREE, USING LEFT INTERNAL MAMMARY ARTERY AND ENDOSCOPICALLY HARVESTED BILATERAL GREATER SAPHENOUS VEINS (N/A) TRANSESOPHAGEAL ECHOCARDIOGRAM (TEE) (N/A)  Total Length of Stay:  LOS: 5 days   Subjective: Awake and alert in chair eating   Objective: Vital signs in last 24 hours: Temp:  [97.9 F (36.6 C)-98.7 F (37.1 C)] 98.7 F (37.1 C) (04/08 0800) Pulse Rate:  [61-90] 77 (04/08 0700) Cardiac Rhythm: Normal sinus rhythm (04/08 0400) Resp:  [12-22] 14 (04/08 0700) BP: (66-153)/(46-82) 131/71 (04/08 0700) SpO2:  [93 %-100 %] 100 % (04/08 0700) Weight:  [192 lb 7.4 oz (87.3 kg)] 192 lb 7.4 oz (87.3 kg) (04/08 0500)  Filed Weights   02/12/17 0500 02/13/17 0500 02/14/17 0500  Weight: 198 lb 13.7 oz (90.2 kg) 194 lb 3.6 oz (88.1 kg) 192 lb 7.4 oz (87.3 kg)    Weight change: -1 lb 12.2 oz (-0.8 kg)   Hemodynamic parameters for last 24 hours:    Intake/Output from previous day: 04/07 0701 - 04/08 0700 In: 1172.4 [P.O.:1080; I.V.:92.4] Out: 1625 [Urine:1625]  Intake/Output this shift: No intake/output data recorded.  Current Meds: Scheduled Meds: . acetaminophen  1,000 mg Oral Q6H   Or  . acetaminophen (TYLENOL) oral liquid 160 mg/5 mL  1,000 mg Per Tube Q6H  . aspirin EC  325 mg Oral Daily   Or  . aspirin  324 mg Per Tube Daily  . atorvastatin  40 mg Oral q1800  . bisacodyl  10 mg Oral Daily   Or  . bisacodyl  10 mg Rectal Daily  . carvedilol  6.25 mg Oral BID WC  . docusate sodium  200 mg Oral Daily  . folic acid  1 mg Oral Daily  . insulin aspart  0-24 Units Subcutaneous Q4H  . mouth rinse   15 mL Mouth Rinse BID  . metoCLOPramide (REGLAN) injection  10 mg Intravenous Q6H  . pantoprazole  40 mg Oral Daily  . potassium chloride  20 mEq Oral Q6H   Or  . potassium chloride  20 mEq Oral Q6H  . potassium chloride (KCL MULTIRUN) 30 mEq in 265 mL IVPB  30 mEq Intravenous Once  . sodium chloride flush  3 mL Intravenous Q12H  . spironolactone  12.5 mg Oral Daily   Continuous Infusions: . sodium chloride Stopped (02/11/17 1100)  . sodium chloride Stopped (02/11/17 0600)  . sodium chloride Stopped (02/10/17 2200)  . DOPamine 2 mcg/kg/min (02/14/17 0700)  . nitroGLYCERIN Stopped (02/10/17 1615)  . phenylephrine (NEO-SYNEPHRINE) Adult infusion Stopped (02/11/17 2300)   PRN Meds:.sodium chloride, metoprolol, midazolam, morphine injection, morphine injection, ondansetron (ZOFRAN) IV, oxyCODONE, sodium chloride flush, traMADol  General appearance: alert, cooperative and no distress Neurologic: intact Heart: regular rate and rhythm, S1, S2 normal, no murmur, click, rub or gallop Lungs: diminished breath sounds bibasilar Abdomen: soft, non-tender; bowel sounds normal; no masses,  no organomegaly Extremities: extremities normal, atraumatic, no cyanosis or edema and Homans sign is negative,  no sign of DVT Wound: sternum stable  Lab Results: CBC:  Recent Labs  02/12/17 0400 02/13/17 0415  WBC 6.5 7.5  HGB 7.9* 7.9*  HCT 24.0* 24.5*  PLT 56* 79*   BMET:   Recent Labs  02/13/17 0415 02/14/17 0415  NA 139 139  K 3.4* 3.3*  CL 100* 102  CO2 27 28  GLUCOSE 106* 102*  BUN 11 13  CREATININE 0.89 0.84  CALCIUM 8.9 8.7*    CMET: Lab Results  Component Value Date   WBC 7.5 02/13/2017   HGB 7.9 (L) 02/13/2017   HCT 24.5 (L) 02/13/2017   PLT 79 (L) 02/13/2017   GLUCOSE 102 (H) 02/14/2017   CHOL 139 05/29/2016   TRIG 85 05/29/2016   HDL 42 05/29/2016   LDLCALC 80 05/29/2016   ALT 27 02/09/2017   AST 37 02/09/2017   NA 139 02/14/2017   K 3.3 (L) 02/14/2017   CL 102  02/14/2017   CREATININE 0.84 02/14/2017   BUN 13 02/14/2017   CO2 28 02/14/2017   TSH 2.560 11/25/2015   INR 1.51 02/10/2017   HGBA1C 5.0 02/10/2017      PT/INR: No results for input(s): LABPROT, INR in the last 72 hours. Radiology: Dg Chest Port 1 View  Result Date: 02/14/2017 CLINICAL DATA:  Chest tube in place. EXAM: PORTABLE CHEST 1 VIEW COMPARISON:  February 13, 2017 FINDINGS: Stable cardiomegaly. A right IJ sheath remains. No chest tubes are identified. No pneumothorax. Tiny left layering effusion suspected. Moderate layering effusion on the right is less prominent in the interval. No other changes. IMPRESSION: Possible tiny left effusion. The moderate right effusion appear smaller in the interval. No other interval change. Electronically Signed   By: Dorise Bullion III M.D   On: 02/14/2017 07:01     Assessment/Plan: S/P Procedure(s) (LRB): CORONARY ARTERY BYPASS GRAFTING (CABG), ON PUMP, TIMES THREE, USING LEFT INTERNAL MAMMARY ARTERY AND ENDOSCOPICALLY HARVESTED BILATERAL GREATER SAPHENOUS VEINS (N/A) TRANSESOPHAGEAL ECHOCARDIOGRAM (TEE) (N/A) Replacing kcl Wean  Off dopamine- tried yesterday but restarted  Some increase in right pleural effusion- xray this am better  HIT pending,plts increasing off heparinGrace Isaac 02/14/2017 8:52 AM

## 2017-02-15 ENCOUNTER — Inpatient Hospital Stay (HOSPITAL_COMMUNITY): Payer: 59

## 2017-02-15 LAB — VAS US DOPPLER PRE CABG
LCCAPSYS: 75 cm/s
LEFT ECA DIAS: -15 cm/s
LEFT VERTEBRAL DIAS: 8 cm/s
LICAPSYS: -60 cm/s
Left CCA dist dias: 18 cm/s
Left CCA dist sys: 75 cm/s
Left CCA prox dias: 17 cm/s
Left ICA dist dias: -15 cm/s
Left ICA dist sys: -44 cm/s
Left ICA prox dias: -25 cm/s
RIGHT ECA DIAS: -10 cm/s
RIGHT VERTEBRAL DIAS: 17 cm/s
Right CCA prox dias: 10 cm/s
Right CCA prox sys: 69 cm/s
Right cca dist sys: -74 cm/s

## 2017-02-15 LAB — CBC
HCT: 23.7 % — ABNORMAL LOW (ref 39.0–52.0)
Hemoglobin: 7.8 g/dL — ABNORMAL LOW (ref 13.0–17.0)
MCH: 31.6 pg (ref 26.0–34.0)
MCHC: 32.9 g/dL (ref 30.0–36.0)
MCV: 96 fL (ref 78.0–100.0)
Platelets: 151 10*3/uL (ref 150–400)
RBC: 2.47 MIL/uL — ABNORMAL LOW (ref 4.22–5.81)
RDW: 12.4 % (ref 11.5–15.5)
WBC: 6.5 10*3/uL (ref 4.0–10.5)

## 2017-02-15 LAB — GLUCOSE, CAPILLARY
GLUCOSE-CAPILLARY: 110 mg/dL — AB (ref 65–99)
GLUCOSE-CAPILLARY: 96 mg/dL (ref 65–99)
GLUCOSE-CAPILLARY: 96 mg/dL (ref 65–99)
Glucose-Capillary: 81 mg/dL (ref 65–99)
Glucose-Capillary: 92 mg/dL (ref 65–99)
Glucose-Capillary: 84 mg/dL (ref 65–99)

## 2017-02-15 LAB — BASIC METABOLIC PANEL
Anion gap: 12 (ref 5–15)
BUN: 16 mg/dL (ref 6–20)
CO2: 27 mmol/L (ref 22–32)
Calcium: 9 mg/dL (ref 8.9–10.3)
Chloride: 101 mmol/L (ref 101–111)
Creatinine, Ser: 0.97 mg/dL (ref 0.61–1.24)
GFR calc Af Amer: >60 mL/min (ref >60)
GFR calc non Af Amer: >60 mL/min (ref >60)
Glucose, Bld: 97 mg/dL (ref 65–99)
Potassium: 3.4 mmol/L — ABNORMAL LOW (ref 3.5–5.1)
Sodium: 140 mmol/L (ref 135–145)

## 2017-02-15 MED ORDER — CHLORHEXIDINE GLUCONATE CLOTH 2 % EX PADS
6.0000 | MEDICATED_PAD | Freq: Every day | CUTANEOUS | Status: DC
  Administered 2017-02-16: 6 via TOPICAL

## 2017-02-15 MED ORDER — POTASSIUM CHLORIDE CRYS ER 20 MEQ PO TBCR
40.0000 meq | EXTENDED_RELEASE_TABLET | Freq: Once | ORAL | Status: DC
  Filled 2017-02-15: qty 2

## 2017-02-15 MED ORDER — SODIUM CHLORIDE 0.9% FLUSH
10.0000 mL | Freq: Two times a day (BID) | INTRAVENOUS | Status: DC
  Administered 2017-02-15 – 2017-02-16 (×2): 10 mL

## 2017-02-15 MED ORDER — SODIUM CHLORIDE 0.9% FLUSH: 10.0000 mL | INTRAVENOUS | Status: DC | PRN

## 2017-02-15 NOTE — Progress Notes (Addendum)
Advanced Heart Failure Rounding Note   Subjective:    02/10/17 S/P CABG x3   Off dopamine BP stable. Diuresed well with IV lasix. Weight down another 6 pounds. Now back to baseline.  Denies SOB. Chest sore. R effusion essentially resolved on CXR (viewed personally)  HIT ab +. Off heparin. PLTs improving. No bleeding.  K 3.4   Objective:   Weight Range:  Vital Signs:   Temp:  [97.8 F (36.6 C)-98.8 F (37.1 C)] 98.5 F (36.9 C) (04/09 0400) Pulse Rate:  [38-81] 70 (04/09 0600) Resp:  [13-28] 19 (04/09 0600) BP: (105-134)/(51-97) 122/60 (04/09 0600) SpO2:  [94 %-100 %] 96 % (04/09 0600) Weight:  [84.5 kg (186 lb 4.8 oz)] 84.5 kg (186 lb 4.8 oz) (04/09 0550) Last BM Date: 02/14/17  Weight change: Filed Weights   02/13/17 0500 02/14/17 0500 02/15/17 0550  Weight: 88.1 kg (194 lb 3.6 oz) 87.3 kg (192 lb 7.4 oz) 84.5 kg (186 lb 4.8 oz)    Intake/Output:   Intake/Output Summary (Last 24 hours) at 02/15/17 0620 Last data filed at 02/15/17 0600  Gross per 24 hour  Intake           553.43 ml  Output             1475 ml  Net          -921.57 ml     Physical Exam: General: Sitting in chair  NAD HEENT: normal. Anicteric  Neck: supple. JVP 5-6  RIJ TLC Carotids 2+ bilat; no bruits. No lymphadenopathy or thryomegaly appreciated. RIJ Swan.  Cor: PMI nondisplaced. RRR  No m/r/g Sternal dressing c/d/i Lungs: clear. No wheeze  Abdomen: soft, NT/ND  No hepatosplenomegaly. No bruits or masses. Good BS Extremities: no cyanosis, clubbing, rash,  Tr  edema. Warm   Left leg SVG harvest scar c/d/i Neuro: A&O x3. cranial nerves grossly intact. MAE x4. Affect pelasant  Skin- Sternal dressing intact. c/d/i GU: Condom cath  Telemetry:  NSR 70s Personally reviewed   Labs: Basic Metabolic Panel:  Recent Labs Lab 02/10/17 0033  02/10/17 2211  02/11/17 0423 02/11/17 1754 02/11/17 1803 02/12/17 0400 02/13/17 0415 02/14/17 0415 02/15/17 0400  NA 137  < >  --   < > 139 141  --   137 139 139 140  K 3.4*  < >  --   < > 4.1 4.1  --  3.9 3.4* 3.3* 3.4*  CL 105  < >  --   < > 110 104  --  105 100* 102 101  CO2 24  --   --   --  23  --   --  25 27 28 27   GLUCOSE 101*  < >  --   < > 117* 125*  --  113* 106* 102* 97  BUN 12  < >  --   < > 6 8  --  7 11 13 16   CREATININE 0.86  < > 0.86  < > 0.83 0.70 0.84 0.80 0.89 0.84 0.97  CALCIUM 9.1  --   --   --  8.4*  --   --  8.5* 8.9 8.7* 9.0  MG 1.7  --  2.5*  --  2.0  --  2.2  --   --   --   --   < > = values in this interval not displayed.  Liver Function Tests:  Recent Labs Lab 02/09/17 0037  AST 37  ALT 27  ALKPHOS  66  BILITOT 1.0  PROT 7.4  ALBUMIN 3.8   No results for input(s): LIPASE, AMYLASE in the last 168 hours. No results for input(s): AMMONIA in the last 168 hours.  CBC:  Recent Labs Lab 02/09/17 0037 02/10/17 0033  02/11/17 1803 02/12/17 0400 02/13/17 0415 02/14/17 1041 02/15/17 0400  WBC 5.4 5.5  < > 8.6 6.5 7.5 6.1 6.5  NEUTROABS 3.5 2.6  --   --   --   --   --   --   HGB 13.5 12.9*  < > 8.3* 7.9* 7.9* 7.8* 7.8*  HCT 39.2* 37.5*  < > 24.6* 24.0* 24.5* 23.1* 23.7*  MCV 94.9 94.5  < > 95.3 96.0 96.8 96.7 96.0  PLT 138* 121*  < > 67* 56* 79* 120* 151  < > = values in this interval not displayed.  Cardiac Enzymes:  Recent Labs Lab 02/09/17 0037 02/09/17 0340 02/09/17 0644  TROPONINI 0.05* 0.54* 1.50*    BNP: BNP (last 3 results) No results for input(s): BNP in the last 8760 hours.  ProBNP (last 3 results) No results for input(s): PROBNP in the last 8760 hours.    Other results:  Imaging: Dg Chest Port 1 View  Result Date: 02/14/2017 CLINICAL DATA:  Chest tube in place. EXAM: PORTABLE CHEST 1 VIEW COMPARISON:  February 13, 2017 FINDINGS: Stable cardiomegaly. A right IJ sheath remains. No chest tubes are identified. No pneumothorax. Tiny left layering effusion suspected. Moderate layering effusion on the right is less prominent in the interval. No other changes. IMPRESSION: Possible  tiny left effusion. The moderate right effusion appear smaller in the interval. No other interval change. Electronically Signed   By: Dorise Bullion III M.D   On: 02/14/2017 07:01     Medications:     Scheduled Medications: . acetaminophen  1,000 mg Oral Q6H   Or  . acetaminophen (TYLENOL) oral liquid 160 mg/5 mL  1,000 mg Per Tube Q6H  . aspirin EC  325 mg Oral Daily   Or  . aspirin  324 mg Per Tube Daily  . atorvastatin  40 mg Oral q1800  . bisacodyl  10 mg Oral Daily   Or  . bisacodyl  10 mg Rectal Daily  . carvedilol  3.125 mg Oral BID WC  . Chlorhexidine Gluconate Cloth  6 each Topical Q0600  . docusate sodium  200 mg Oral Daily  . folic acid  1 mg Oral Daily  . furosemide  40 mg Intravenous BID  . insulin aspart  0-24 Units Subcutaneous Q4H  . mouth rinse  15 mL Mouth Rinse BID  . metoCLOPramide (REGLAN) injection  10 mg Intravenous Q6H  . pantoprazole  40 mg Oral Daily  . potassium chloride (KCL MULTIRUN) 30 mEq in 265 mL IVPB  30 mEq Intravenous Once  . sodium chloride flush  10-40 mL Intracatheter Q12H  . sodium chloride flush  3 mL Intravenous Q12H  . spironolactone  12.5 mg Oral Daily    Infusions: . sodium chloride Stopped (02/11/17 1100)  . sodium chloride Stopped (02/11/17 0600)  . sodium chloride Stopped (02/10/17 2200)  . DOPamine Stopped (02/14/17 1100)  . nitroGLYCERIN Stopped (02/10/17 1615)  . phenylephrine (NEO-SYNEPHRINE) Adult infusion Stopped (02/11/17 2300)    PRN Medications: sodium chloride, metoprolol, midazolam, morphine injection, morphine injection, ondansetron (ZOFRAN) IV, oxyCODONE, sodium chloride flush, sodium chloride flush, traMADol   Assessment/Plan/Discussion    1. CAD- POD S/P CABG x3. On 4/5 2. Acute Systolic Heart Failure- ICM. ECHO  EF ~25%.  3. Hyperlipidemia 4. Arthritis 5. Anemia 6. Thrombocytopenia 7. Hypokalemia 8. R pleural effusion   Doing well post-op  Volume status now back to baseline. Stop IV lasix. Can  start po lasix tomorrow as needed.   HF stable. On carvedilol 3.125 and spiro 12.5. BP still a bit soft. Would wait another day to start low-dose losartan. Suspect EF will recover with revascularization as troponin didn't bump much and no major scar at time of CABG.   R pleural effusion resolved on CXR (personally reviewed)   Hit AB weakly +. Off heparin. Platelets coming up - 151k today . No bleeding. Doubt he actually ahs HIT. Discussed with PharmD.   Hgb stable at 7.8   Maintaining NSR   Continue to mobilize   Length of Stay: 6 Trusten Hume MD 02/15/2017, 6:20 AM  Advanced Heart Failure Team Pager [phone] (M-F; 7a - 4p)  Please contact [facility] for night-coverage after hours (4p -7a ) and weekends on [email or web address]

## 2017-02-15 NOTE — Progress Notes (Signed)
qPhysical Therapy Treatment Patient Details Name: Seth Byrd MRN: [id number] DOB: 08-15-1946 Today's Date: 02/15/2017    History of Present Illness  Seth Byrd is a 71 year old AA male with a history of HTN, RA,prostate CA, hyperlipidemia, and former smoker.  Admitted 02/09/17 with NSTEMI, now s/p CABG x 3 on 02/10/17.    PT Comments    Pt making good progress. No problem with BP today. Continue to recommend ST-SNF since pt lives alone.   Follow Up Recommendations  SNF     Equipment Recommendations  None recommended by PT    Recommendations for Other Services       Precautions / Restrictions Precautions Precautions: Fall;Sternal Restrictions Weight Bearing Restrictions: No    Mobility  Bed Mobility Overal bed mobility: Needs Assistance Bed Mobility: Sit to Sidelying         Sit to sidelying: Mod assist General bed mobility comments: Assist to bring legs up and lower trunk.  Transfers Overall transfer level: Needs assistance Equipment used: Rolling walker (2 wheeled) Transfers: Sit to/from Stand Sit to Stand: Mod assist         General transfer comment: Assist to bring hips up. Had pt place hands on knees to follow sternal precautions.  Ambulation/Gait Ambulation/Gait assistance: Min assist Ambulation Distance (Feet): 300 Feet Assistive device: Rolling walker (2 wheeled) Gait Pattern/deviations: Step-through pattern;Decreased stride length;Trunk flexed Gait velocity: decr Gait velocity interpretation: Below normal speed for age/gender General Gait Details: Assist for balance and support.  Amb on 2L of O2 with SpO2 >96%. HR 90's. Verbal cues for pt to stand more erect.    Stairs            Wheelchair Mobility    Modified Rankin (Stroke Patients Only)       Balance Overall balance assessment: Needs assistance Sitting-balance support: Feet supported;No upper extremity supported Sitting balance-Leahy Scale: Good Sitting balance - Comments: min  to mod A for balance at EOB, leaning to R and posterior; seated trunk work for cervical rotation, lumbo pelvic lateral and ant/post movements   Standing balance support: Single extremity supported Standing balance-Leahy Scale: Poor Standing balance comment: UE support and supervision for static standing                            Cognition Arousal/Alertness: Awake/alert Behavior During Therapy: WFL for tasks assessed/performed Overall Cognitive Status: Within Functional Limits for tasks assessed                                        Exercises      General Comments        Pertinent Vitals/Pain Pain Assessment: Faces Faces Pain Scale: No hurt    Home Living                      Prior Function            PT Goals (current goals can now be found in the care plan section) Progress towards PT goals: Progressing toward goals    Frequency    Min 3X/week      PT Plan Current plan remains appropriate    Co-evaluation             End of Session Equipment Utilized During Treatment: Oxygen Activity Tolerance: Patient tolerated treatment well (decreased BP ) Patient left: in bed;with  call bell/phone within reach;with nursing/sitter in room Nurse Communication: Mobility status PT Visit Diagnosis: Muscle weakness (generalized) (M62.81);Other abnormalities of gait and mobility (R26.89)     Time: 5301-0404 PT Time Calculation (min) (ACUTE ONLY): 28 min  Charges:  $Gait Training: 23-37 mins                    G Codes:       [facility] PT [place] 02/15/2017, 10:19 AM

## 2017-02-15 NOTE — Progress Notes (Signed)
Peripherally Inserted Central Catheter/Midline Placement  The IV Nurse has discussed with the patient and/or persons authorized to consent for the patient, the purpose of this procedure and the potential benefits and risks involved with this procedure.  The benefits include less needle sticks, lab draws from the catheter, and the patient may be discharged home with the catheter. Risks include, but not limited to, infection, bleeding, blood clot (thrombus formation), and puncture of an artery; nerve damage and irregular heartbeat and possibility to perform a PICC exchange if needed/ordered by physician.  Alternatives to this procedure were also discussed.  Bard Power PICC patient education guide, fact sheet on infection prevention and patient information card has been provided to patient /or left at bedside.    PICC/Midline Placement Documentation        Darlyn Read 02/15/2017, 3:52 PM

## 2017-02-15 NOTE — Progress Notes (Signed)
Patient ID: Seth Byrd, male   DOB: 12-Jan-1946, 72 y.o.   MRN: [id number] SICU Evening Rounds:  Hemodynamically stable in sinus rhythm  sats 100%  Urine output ok  No problems today.

## 2017-02-15 NOTE — Progress Notes (Addendum)
      [address]       [place] [address]             [phone]      5 Days Post-Op Procedure(s) (LRB): CORONARY ARTERY BYPASS GRAFTING (CABG), ON PUMP, TIMES THREE, USING LEFT INTERNAL MAMMARY ARTERY AND ENDOSCOPICALLY HARVESTED BILATERAL GREATER SAPHENOUS VEINS (N/A) TRANSESOPHAGEAL ECHOCARDIOGRAM (TEE) (N/A) Subjective: Feels good. No issues overnight.   Objective: Vital signs in last 24 hours: Temp:  [97.4 F (36.3 C)-98.8 F (37.1 C)] 97.4 F (36.3 C) (04/09 0841) Pulse Rate:  [39-81] 75 (04/09 1000) Cardiac Rhythm: Normal sinus rhythm (04/09 0725) Resp:  [13-25] 25 (04/09 1000) BP: (105-134)/(51-109) 131/67 (04/09 1000) SpO2:  [94 %-100 %] 98 % (04/09 1000) Weight:  [84.5 kg (186 lb 4.8 oz)] 84.5 kg (186 lb 4.8 oz) (04/09 0550)     Intake/Output from previous day: 04/08 0701 - 04/09 0700 In: 1030.1 [P.O.:720; I.V.:310.1] Out: 1275 [Urine:1275] Intake/Output this shift: Total I/O In: 240 [P.O.:240] Out: 150 [Urine:150]  General appearance: alert, cooperative and no distress Heart: regular rate and rhythm, S1, S2 normal, no murmur, click, rub or gallop Lungs: clear to auscultation bilaterally Abdomen: soft, non-tender; bowel sounds normal; no masses,  no organomegaly Extremities: extremities normal, atraumatic, no cyanosis or edema Wound: bilateral vein harvest sites all without errythema or edema. Healing well. Sternal incision covered with sterile dressing  Lab Results:  Recent Labs  02/14/17 1041 02/15/17 0400  WBC 6.1 6.5  HGB 7.8* 7.8*  HCT 23.1* 23.7*  PLT 120* 151   BMET:  Recent Labs  02/14/17 0415 02/15/17 0400  NA 139 140  K 3.3* 3.4*  CL 102 101  CO2 28 27  GLUCOSE 102* 97  BUN 13 16  CREATININE 0.84 0.97  CALCIUM 8.7* 9.0    PT/INR: No results for input(s): LABPROT, INR in the last 72 hours. ABG    Component Value Date/Time   PHART 7.372 02/11/2017 0007   HCO3 21.6 02/11/2017 0007   TCO2 24 02/11/2017  1754   ACIDBASEDEF 3.0 (H) 02/11/2017 0007   O2SAT 56.8 02/12/2017 0823   CBG (last 3)   Recent Labs  02/14/17 2331 02/15/17 0357 02/15/17 0828  GLUCAP 77 92 110*    Assessment/Plan: S/P Procedure(s) (LRB): CORONARY ARTERY BYPASS GRAFTING (CABG), ON PUMP, TIMES THREE, USING LEFT INTERNAL MAMMARY ARTERY AND ENDOSCOPICALLY HARVESTED BILATERAL GREATER SAPHENOUS VEINS (N/A) TRANSESOPHAGEAL ECHOCARDIOGRAM (TEE) (N/A)  1. CV-NSR in the 59s. BP well controlled. Off Dopamine. HF service following. Tolerating carvedilol and spirolactone. Hold off on ARB for now due to soft BP. 2. Pulm-2L  good oxygen saturation. CXR this morning with improved bilateral lower lobe airspace disease and effusions.  3. Renal- Creatinine stable at 0.97. Electrolytes replaced. Weight now at baseline. Stopping IV lasix. Continues to make good urine.  4. H and H stable and platelets trending up. Holding off on blood transfusion for now, asymptomatic. 5. Endo-blood glucose level well controlled on current regimen.      LOS: 6 days    Seth Byrd 02/15/2017  Place pic and d/c neck line I have seen and examined Seth Byrd and agree with the above assessment  and plan.  Seth Isaac MD [facility] [phone] Office [phone] 02/15/2017 2:53 PM

## 2017-02-15 NOTE — Care Management Note (Signed)
Case Management Note  Patient Details  Name: Seth Byrd MRN: [id number] Date of Birth: 1946/08/07  Subjective/Objective:  Pt is s/p CABG                  Action/Plan:   PTA from home.  Pt remains on inotropic drip.  Per bedside nurse pt required a lot of assistance for ambulation - PT/PT ordered.  CM will continue to follow for discharge needs   Expected Discharge Date:                  Expected Discharge Plan:     In-House Referral:     Discharge planning Services  CM Consult  Post Acute Care Choice:    Choice offered to:     DME Arranged:    DME Agency:     HH Arranged:    HH Agency:     Status of Service:     If discussed at [employer], dates discussed:    Additional Comments: Recommendation is for SNF - CSW consulted Maryclare Labrador, RN 02/15/2017, 4:19 PM

## 2017-02-16 DIAGNOSIS — I5043 Acute on chronic combined systolic (congestive) and diastolic (congestive) heart failure: Secondary | ICD-10-CM

## 2017-02-16 LAB — CBC WITH DIFFERENTIAL/PLATELET
Basophils Absolute: 0 10*3/uL (ref 0.0–0.1)
Basophils Relative: 0 %
Eosinophils Absolute: 0.2 10*3/uL (ref 0.0–0.7)
Eosinophils Relative: 2 %
HCT: 25.1 % — ABNORMAL LOW (ref 39.0–52.0)
HEMOGLOBIN: 8.1 g/dL — AB (ref 13.0–17.0)
LYMPHS ABS: 1.3 10*3/uL (ref 0.7–4.0)
LYMPHS PCT: 19 %
MCH: 31 pg (ref 26.0–34.0)
MCHC: 32.3 g/dL (ref 30.0–36.0)
MCV: 96.2 fL (ref 78.0–100.0)
Monocytes Absolute: 0.6 10*3/uL (ref 0.1–1.0)
Monocytes Relative: 9 %
NEUTROS PCT: 70 %
Neutro Abs: 4.5 10*3/uL (ref 1.7–7.7)
Platelets: 160 10*3/uL (ref 150–400)
RBC: 2.61 MIL/uL — AB (ref 4.22–5.81)
RDW: 12.4 % (ref 11.5–15.5)
WBC: 6.6 10*3/uL (ref 4.0–10.5)

## 2017-02-16 LAB — BASIC METABOLIC PANEL
Anion gap: 9 (ref 5–15)
BUN: 11 mg/dL (ref 6–20)
CHLORIDE: 105 mmol/L (ref 101–111)
CO2: 27 mmol/L (ref 22–32)
Calcium: 9.3 mg/dL (ref 8.9–10.3)
Creatinine, Ser: 0.81 mg/dL (ref 0.61–1.24)
GFR calc non Af Amer: >60 mL/min (ref >60)
Glucose, Bld: 106 mg/dL — ABNORMAL HIGH (ref 65–99)
Potassium: 4 mmol/L (ref 3.5–5.1)
SODIUM: 141 mmol/L (ref 135–145)

## 2017-02-16 LAB — GLUCOSE, CAPILLARY
GLUCOSE-CAPILLARY: 99 mg/dL (ref 65–99)
GLUCOSE-CAPILLARY: 98 mg/dL (ref 65–99)
GLUCOSE-CAPILLARY: 90 mg/dL (ref 65–99)
Glucose-Capillary: 84 mg/dL (ref 65–99)
Glucose-Capillary: 87 mg/dL (ref 65–99)
Glucose-Capillary: 97 mg/dL (ref 65–99)

## 2017-02-16 LAB — MAGNESIUM: Magnesium: 2 mg/dL (ref 1.7–2.4)

## 2017-02-16 MED ORDER — BISACODYL 10 MG RE SUPP: 10.0000 mg | Freq: Every day | RECTAL | Status: DC | PRN

## 2017-02-16 MED ORDER — SODIUM CHLORIDE 0.9% FLUSH: 3.0000 mL | Freq: Two times a day (BID) | INTRAVENOUS | Status: DC

## 2017-02-16 MED ORDER — ASPIRIN EC 325 MG PO TBEC
325.0000 mg | DELAYED_RELEASE_TABLET | Freq: Every day | ORAL | Status: DC
  Administered 2017-02-17 – 2017-02-19 (×3): 325 mg via ORAL
  Filled 2017-02-16 (×4): qty 1

## 2017-02-16 MED ORDER — INSULIN ASPART 100 UNIT/ML ~~LOC~~ SOLN: 0.0000 [IU] | Freq: Three times a day (TID) | SUBCUTANEOUS | Status: DC

## 2017-02-16 MED ORDER — SODIUM CHLORIDE 0.9 % IV SOLN: 250.0000 mL | INTRAVENOUS | Status: DC | PRN

## 2017-02-16 MED ORDER — SODIUM CHLORIDE 0.9% FLUSH: 3.0000 mL | INTRAVENOUS | Status: DC | PRN

## 2017-02-16 MED ORDER — ONDANSETRON HCL 4 MG/2ML IJ SOLN: 4.0000 mg | Freq: Four times a day (QID) | INTRAMUSCULAR | Status: DC | PRN

## 2017-02-16 MED ORDER — FUROSEMIDE 40 MG PO TABS
40.0000 mg | ORAL_TABLET | Freq: Every day | ORAL | Status: DC
  Administered 2017-02-16 – 2017-02-19 (×4): 40 mg via ORAL
  Filled 2017-02-16 (×4): qty 1

## 2017-02-16 MED ORDER — TRAMADOL HCL 50 MG PO TABS
50.0000 mg | ORAL_TABLET | ORAL | Status: DC | PRN
  Administered 2017-02-17 – 2017-02-18 (×2): 100 mg via ORAL
  Filled 2017-02-16 (×2): qty 2

## 2017-02-16 MED ORDER — POTASSIUM CHLORIDE CRYS ER 20 MEQ PO TBCR
20.0000 meq | EXTENDED_RELEASE_TABLET | Freq: Every day | ORAL | Status: DC
  Administered 2017-02-16 – 2017-02-18 (×3): 20 meq via ORAL
  Filled 2017-02-16 (×3): qty 1

## 2017-02-16 MED ORDER — ACETAMINOPHEN 325 MG PO TABS
650.0000 mg | ORAL_TABLET | Freq: Four times a day (QID) | ORAL | Status: DC | PRN
  Administered 2017-02-19: 650 mg via ORAL
  Filled 2017-02-16: qty 2

## 2017-02-16 MED ORDER — MOVING RIGHT ALONG BOOK
Freq: Once | Status: AC
  Administered 2017-02-16: 1
  Filled 2017-02-16: qty 1

## 2017-02-16 MED ORDER — ONDANSETRON HCL 4 MG PO TABS: 4.0000 mg | ORAL_TABLET | Freq: Four times a day (QID) | ORAL | Status: DC | PRN

## 2017-02-16 MED ORDER — PANTOPRAZOLE SODIUM 40 MG PO TBEC
40.0000 mg | DELAYED_RELEASE_TABLET | Freq: Every day | ORAL | Status: DC
  Administered 2017-02-17 – 2017-02-19 (×3): 40 mg via ORAL
  Filled 2017-02-16 (×3): qty 1

## 2017-02-16 MED ORDER — BISACODYL 5 MG PO TBEC: 10.0000 mg | DELAYED_RELEASE_TABLET | Freq: Every day | ORAL | Status: DC | PRN

## 2017-02-16 NOTE — Progress Notes (Signed)
Advanced Heart Failure Rounding Note   Subjective:    02/10/17 S/P CABG x3   CXR 02/15/17 with improving bilateral lower lobe airspace disease and effusions.  Personally reviewed.   Feeling great. Denies lightheadedness or dizziness. No CP. Ambulating halls without difficulty.   Out 500 cc and down 2 lbs off diuretics. CVP 2-3.   HIT ab +. Off heparin. PLTs up to 160 from 151 yesterday. No overt bleeding. K 4.0    Objective:   Weight Range:  Vital Signs:   Temp:  [97.5 F (36.4 C)-98.9 F (37.2 C)] 98.4 F (36.9 C) (04/10 0830) Pulse Rate:  [69-85] 83 (04/10 0700) Resp:  [13-25] 19 (04/10 0700) BP: (117-152)/(56-109) 132/59 (04/10 0700) SpO2:  [94 %-100 %] 97 % (04/10 0700) Weight:  [184 lb 8 oz (83.7 kg)] 184 lb 8 oz (83.7 kg) (04/10 0600) Last BM Date: 02/15/17  Weight change: Filed Weights   02/14/17 0500 02/15/17 0550 02/16/17 0600  Weight: 192 lb 7.4 oz (87.3 kg) 186 lb 4.8 oz (84.5 kg) 184 lb 8 oz (83.7 kg)    Intake/Output:   Intake/Output Summary (Last 24 hours) at 02/16/17 0859 Last data filed at 02/16/17 0600  Gross per 24 hour  Intake              660 ml  Output             1202 ml  Net             -542 ml     Physical Exam: General: Elderly AA male in NAD.  HEENT: Normal Neck: Supple. JVP flat. RIJ TLC Carotids 2+ bilat; no bruits. No thyromegaly or nodule noted. RIJ Swan.  Cor: PMI non-dispalced. RRR. No M/G/R. Sternotomy incision well approximated.  Lungs: CTAB, normal effort. No wheeze.   Abdomen: Soft, NT, ND, no HSM. No bruits or masses. +BS  Extremities: no cyanosis, clubbing, rash,  Tr  edema. Warm   Left leg SVG harvest scar c/d/i Neuro: Alert & oriented x 3. Cranial nerves grossly intact. Moves all 4 extremities w/o difficulty. Affect pleasant   Skin- Sternal wound well approximated.  GU: Condom cath  Telemetry: Personally reviewed, NSR 70s with occasional PVCs.    Labs: Basic Metabolic Panel:  Recent Labs Lab 02/10/17 0033   02/10/17 2211  02/11/17 0423  02/11/17 1803 02/12/17 0400 02/13/17 0415 02/14/17 0415 02/15/17 0400 02/16/17 0326  NA 137  < >  --   < > 139  < >  --  137 139 139 140 141  K 3.4*  < >  --   < > 4.1  < >  --  3.9 3.4* 3.3* 3.4* 4.0  CL 105  < >  --   < > 110  < >  --  105 100* 102 101 105  CO2 24  --   --   --  23  --   --  25 27 28 27 27   GLUCOSE 101*  < >  --   < > 117*  < >  --  113* 106* 102* 97 106*  BUN 12  < >  --   < > 6  < >  --  7 11 13 16 11   CREATININE 0.86  < > 0.86  < > 0.83  < > 0.84 0.80 0.89 0.84 0.97 0.81  CALCIUM 9.1  --   --   --  8.4*  --   --  8.5* 8.9  8.7* 9.0 9.3  MG 1.7  --  2.5*  --  2.0  --  2.2  --   --   --   --  2.0  < > = values in this interval not displayed.  Liver Function Tests: No results for input(s): AST, ALT, ALKPHOS, BILITOT, PROT, ALBUMIN in the last 168 hours. No results for input(s): LIPASE, AMYLASE in the last 168 hours. No results for input(s): AMMONIA in the last 168 hours.  CBC:  Recent Labs Lab 02/10/17 0033  02/12/17 0400 02/13/17 0415 02/14/17 1041 02/15/17 0400 02/16/17 0326  WBC 5.5  < > 6.5 7.5 6.1 6.5 6.6  NEUTROABS 2.6  --   --   --   --   --  4.5  HGB 12.9*  < > 7.9* 7.9* 7.8* 7.8* 8.1*  HCT 37.5*  < > 24.0* 24.5* 23.1* 23.7* 25.1*  MCV 94.5  < > 96.0 96.8 96.7 96.0 96.2  PLT 121*  < > 56* 79* 120* 151 160  < > = values in this interval not displayed.  Cardiac Enzymes: No results for input(s): CKTOTAL, CKMB, CKMBINDEX, TROPONINI in the last 168 hours.  BNP: BNP (last 3 results) No results for input(s): BNP in the last 8760 hours.  ProBNP (last 3 results) No results for input(s): PROBNP in the last 8760 hours.    Other results:  Imaging: Dg Chest Port 1 View  Result Date: 02/15/2017 CLINICAL DATA:  Chest tube removal. Chest pain. Five days post CABG. EXAM: PORTABLE CHEST 1 VIEW COMPARISON:  One-view chest x-ray 02/14/2017. FINDINGS: A right IJ sheath remains. The heart is enlarged. Bilateral effusions are  suspected. Bilateral lower lobe airspace disease is improving. The visualized soft tissues and bony thorax are unremarkable. IMPRESSION: 1. Improving bilateral lower lobe airspace disease and effusions. Electronically Signed   By: San Morelle M.D.   On: 02/15/2017 07:20     Medications:     Scheduled Medications: . aspirin EC  325 mg Oral Daily   Or  . aspirin  324 mg Per Tube Daily  . atorvastatin  40 mg Oral q1800  . bisacodyl  10 mg Oral Daily   Or  . bisacodyl  10 mg Rectal Daily  . carvedilol  3.125 mg Oral BID WC  . Chlorhexidine Gluconate Cloth  6 each Topical Daily  . docusate sodium  200 mg Oral Daily  . folic acid  1 mg Oral Daily  . insulin aspart  0-24 Units Subcutaneous Q4H  . pantoprazole  40 mg Oral Daily  . potassium chloride (KCL MULTIRUN) 30 mEq in 265 mL IVPB  30 mEq Intravenous Once  . potassium chloride  40 mEq Oral Once  . sodium chloride flush  10-40 mL Intracatheter Q12H  . sodium chloride flush  3 mL Intravenous Q12H  . spironolactone  12.5 mg Oral Daily    Infusions: . sodium chloride Stopped (02/11/17 1100)  . sodium chloride Stopped (02/11/17 0600)  . sodium chloride Stopped (02/10/17 2200)  . DOPamine Stopped (02/14/17 1100)  . nitroGLYCERIN Stopped (02/10/17 1615)  . phenylephrine (NEO-SYNEPHRINE) Adult infusion Stopped (02/11/17 2300)    PRN Medications: sodium chloride, metoprolol, midazolam, morphine injection, morphine injection, ondansetron (ZOFRAN) IV, oxyCODONE, sodium chloride flush, sodium chloride flush, traMADol   Assessment/Plan/Discussion    1. CAD- POD S/P CABG x3. On 4/5 - Suspect EF will recover with revascularization as troponin didn't bump much and no major scar at time of CABG.  2. Acute Systolic Heart Failure-  ICM. ECHO EF ~25%.  - HF stable. BP much improved. Volume status at baseline vs dry. - CVP 2-3. Would hold lasix today.  - Continue coreg 3.125 mg BID - Continue spiro 12.5 mg daily.  - Start losartan  12.5 mg daily 3. HLD - Continue statin and ASA 4. Anemia - Stable at 8.1 this am.  - Continue to follow 5. Thrombocytopenia - Hit AB weakly +. Off heparin - Platelets up to 160.  - No bleeding.  ? If true HIT.  6. Hypokalemia - Stable. On spiro as above.  7. R pleural effusion  - Resolving on CXR. Reviewed personally.   Length of Stay: [place], [place]  02/16/2017, 8:59 AM  Advanced Heart Failure Team Pager [phone] (M-F; 7a - 4p)  Please contact [facility] for night-coverage after hours (4p -7a ) and weekends on [email or web address]  Patient seen with PA, agree with the above note.  He is doing well post-CABG.  Volume status ok, agree with holding Lasix today.  Add losartan to regimen as above.  Transfer to step down.   Loralie Champagne 02/16/2017

## 2017-02-16 NOTE — Progress Notes (Signed)
qPhysical Therapy Treatment Patient Details Name: Seth Byrd MRN: [id number] DOB: 12-07-1945 Today's Date: 02/16/2017    History of Present Illness  Mr Seth Byrd is a 71 year old AA male with a history of HTN, RA,prostate CA, hyperlipidemia, and former smoker.  Admitted 02/09/17 with NSTEMI, now s/p CABG x 3 on 02/10/17.    PT Comments    Pt continues to make steady progress. Continue to recommend ST-SNF due to lives alone and requiring assist for all mobility.   Follow Up Recommendations  SNF     Equipment Recommendations  None recommended by PT    Recommendations for Other Services       Precautions / Restrictions Precautions Precautions: Fall;Sternal Restrictions Weight Bearing Restrictions: No    Mobility  Bed Mobility Overal bed mobility: Needs Assistance Bed Mobility: Sit to Sidelying         Sit to sidelying: Mod assist General bed mobility comments: Assist to bring legs up and lower trunk.  Transfers Overall transfer level: Needs assistance Equipment used: Rolling walker (2 wheeled) Transfers: Sit to/from Stand Sit to Stand: Mod assist         General transfer comment: Assist to bring hips up. Had pt place hands on knees to follow sternal precautions.  Ambulation/Gait Ambulation/Gait assistance: Min assist Ambulation Distance (Feet): 300 Feet Assistive device: 4-wheeled walker Gait Pattern/deviations: Step-through pattern;Decreased stride length;Trunk flexed Gait velocity: decr Gait velocity interpretation: Below normal speed for age/gender General Gait Details: Assist for balance and support.  Amb on 2L of O2 with SpO2 not getting good wave form. Verbal cues for pt to stand more erect and stay closer to walker   Stairs            Wheelchair Mobility    Modified Rankin (Stroke Patients Only)       Balance Overall balance assessment: Needs assistance Sitting-balance support: Feet supported;No upper extremity supported Sitting  balance-Leahy Scale: Good     Standing balance support: Single extremity supported Standing balance-Leahy Scale: Poor Standing balance comment: UE support and supervision for static standing                            Cognition Arousal/Alertness: Awake/alert Behavior During Therapy: WFL for tasks assessed/performed Overall Cognitive Status: Within Functional Limits for tasks assessed                                        Exercises      General Comments        Pertinent Vitals/Pain Pain Assessment: Faces Faces Pain Scale: No hurt    Home Living                      Prior Function            PT Goals (current goals can now be found in the care plan section) Progress towards PT goals: Progressing toward goals    Frequency    Min 2X/week      PT Plan Current plan remains appropriate;Frequency needs to be updated    Co-evaluation             End of Session Equipment Utilized During Treatment: Oxygen Activity Tolerance: Patient tolerated treatment well Patient left: in bed;with call bell/phone within reach;with nursing/sitter in room Nurse Communication: Mobility status PT Visit Diagnosis: Muscle weakness (generalized) (M62.81);Other abnormalities  of gait and mobility (R26.89)     Time: 1110-1136 PT Time Calculation (min) (ACUTE ONLY): 26 min  Charges:  $Gait Training: 23-37 mins                    G Codes:       [facility] PT [place] 02/16/2017, 2:01 PM

## 2017-02-16 NOTE — Progress Notes (Signed)
Patient ID: Seth Byrd, male   DOB: 06/17/1946, 71 y.o.   MRN: [id number] TCTS DAILY ICU PROGRESS NOTE                   [address]            [place] [address]          [phone]   6 Days Post-Op Procedure(s) (LRB): CORONARY ARTERY BYPASS GRAFTING (CABG), ON PUMP, TIMES THREE, USING LEFT INTERNAL MAMMARY ARTERY AND ENDOSCOPICALLY HARVESTED BILATERAL GREATER SAPHENOUS VEINS (N/A) TRANSESOPHAGEAL ECHOCARDIOGRAM (TEE) (N/A)  Total Length of Stay:  LOS: 7 days   Subjective: Awake and alert, ambulating better   Objective: Vital signs in last 24 hours: Temp:  [97.5 F (36.4 C)-98.9 F (37.2 C)] 97.7 F (36.5 C) (04/10 1236) Pulse Rate:  [69-85] 76 (04/10 1200) Cardiac Rhythm: Normal sinus rhythm (04/10 1200) Resp:  [13-26] 23 (04/10 1200) BP: (108-152)/(51-85) 152/66 (04/10 1200) SpO2:  [94 %-100 %] 97 % (04/10 1200) Weight:  [184 lb 8 oz (83.7 kg)] 184 lb 8 oz (83.7 kg) (04/10 0600)  Filed Weights   02/14/17 0500 02/15/17 0550 02/16/17 0600  Weight: 192 lb 7.4 oz (87.3 kg) 186 lb 4.8 oz (84.5 kg) 184 lb 8 oz (83.7 kg)    Weight change: -1 lb 12.8 oz (-0.816 kg)   Hemodynamic parameters for last 24 hours:    Intake/Output from previous day: 04/09 0701 - 04/10 0700 In: 660 [P.O.:660] Out: 1202 [Urine:1200; Stool:2]  Intake/Output this shift: Total I/O In: 120 [P.O.:120] Out: -   Current Meds: Scheduled Meds: . aspirin EC  325 mg Oral Daily   Or  . aspirin  324 mg Per Tube Daily  . atorvastatin  40 mg Oral q1800  . bisacodyl  10 mg Oral Daily   Or  . bisacodyl  10 mg Rectal Daily  . carvedilol  3.125 mg Oral BID WC  . Chlorhexidine Gluconate Cloth  6 each Topical Daily  . docusate sodium  200 mg Oral Daily  . folic acid  1 mg Oral Daily  . insulin aspart  0-24 Units Subcutaneous Q4H  . pantoprazole  40 mg Oral Daily  . potassium chloride (KCL MULTIRUN) 30 mEq in 265 mL IVPB  30 mEq Intravenous Once  . potassium chloride  40 mEq Oral  Once  . sodium chloride flush  10-40 mL Intracatheter Q12H  . sodium chloride flush  3 mL Intravenous Q12H  . spironolactone  12.5 mg Oral Daily   Continuous Infusions: . sodium chloride Stopped (02/11/17 1100)  . sodium chloride Stopped (02/11/17 0600)  . sodium chloride Stopped (02/10/17 2200)  . DOPamine Stopped (02/14/17 1100)  . nitroGLYCERIN Stopped (02/10/17 1615)  . phenylephrine (NEO-SYNEPHRINE) Adult infusion Stopped (02/11/17 2300)   PRN Meds:.sodium chloride, metoprolol, midazolam, morphine injection, morphine injection, ondansetron (ZOFRAN) IV, oxyCODONE, sodium chloride flush, sodium chloride flush, traMADol  General appearance: alert and cooperative Neurologic: intact Heart: regular rate and rhythm, S1, S2 normal, no murmur, click, rub or gallop Lungs: diminished breath sounds bibasilar Abdomen: soft, non-tender; bowel sounds normal; no masses,  no organomegaly Extremities: extremities normal, atraumatic, no cyanosis or edema and Homans sign is negative, no sign of DVT Wound: sternum stable  Lab Results: CBC: Recent Labs  02/15/17 0400 02/16/17 0326  WBC 6.5 6.6  HGB 7.8* 8.1*  HCT 23.7* 25.1*  PLT 151 160   BMET:  Recent Labs  02/15/17 0400 02/16/17 0326  NA 140 141  K 3.4* 4.0  CL 101 105  CO2 27 27  GLUCOSE 97 106*  BUN 16 11  CREATININE 0.97 0.81  CALCIUM 9.0 9.3    CMET: Lab Results  Component Value Date   WBC 6.6 02/16/2017   HGB 8.1 (L) 02/16/2017   HCT 25.1 (L) 02/16/2017   PLT 160 02/16/2017   GLUCOSE 106 (H) 02/16/2017   CHOL 139 05/29/2016   TRIG 85 05/29/2016   HDL 42 05/29/2016   LDLCALC 80 05/29/2016   ALT 27 02/09/2017   AST 37 02/09/2017   NA 141 02/16/2017   K 4.0 02/16/2017   CL 105 02/16/2017   CREATININE 0.81 02/16/2017   BUN 11 02/16/2017   CO2 27 02/16/2017   TSH 2.560 11/25/2015   INR 1.51 02/10/2017   HGBA1C 5.0 02/10/2017      PT/INR: No results for input(s): LABPROT, INR in the last 72  hours. Radiology: No results found.   Assessment/Plan: S/P Procedure(s) (LRB): CORONARY ARTERY BYPASS GRAFTING (CABG), ON PUMP, TIMES THREE, USING LEFT INTERNAL MAMMARY ARTERY AND ENDOSCOPICALLY HARVESTED BILATERAL GREATER SAPHENOUS VEINS (N/A) TRANSESOPHAGEAL ECHOCARDIOGRAM (TEE) (N/A) Mobilize Diuresis Diabetes control Plan for transfer to step-down: see transfer orders     [name] 02/16/2017 1:13 PM

## 2017-02-16 NOTE — Progress Notes (Signed)
Report called to Clarene Critchley, RN on 2West  Sherrie Mustache 4:43 PM

## 2017-02-16 NOTE — Progress Notes (Signed)
Pt transferred to 2West20 via wheelchair, vss on 2L, all patient belongings at side, daughter Lattie Haw at side, receiving RN and NT at bedside, pt settled in bed.   Seth Byrd

## 2017-02-17 ENCOUNTER — Inpatient Hospital Stay (HOSPITAL_COMMUNITY): Payer: 59

## 2017-02-17 DIAGNOSIS — I5022 Chronic systolic (congestive) heart failure: Secondary | ICD-10-CM

## 2017-02-17 LAB — BASIC METABOLIC PANEL
Anion gap: 10 (ref 5–15)
BUN: 13 mg/dL (ref 6–20)
CO2: 25 mmol/L (ref 22–32)
Calcium: 9.2 mg/dL (ref 8.9–10.3)
Chloride: 104 mmol/L (ref 101–111)
Creatinine, Ser: 0.95 mg/dL (ref 0.61–1.24)
GFR calc Af Amer: >60 mL/min (ref >60)
GFR calc non Af Amer: >60 mL/min (ref >60)
Glucose, Bld: 99 mg/dL (ref 65–99)
Potassium: 4 mmol/L (ref 3.5–5.1)
Sodium: 139 mmol/L (ref 135–145)

## 2017-02-17 LAB — CBC
HCT: 25.3 % — ABNORMAL LOW (ref 39.0–52.0)
Hemoglobin: 8.3 g/dL — ABNORMAL LOW (ref 13.0–17.0)
MCH: 31.7 pg (ref 26.0–34.0)
MCHC: 32.8 g/dL (ref 30.0–36.0)
MCV: 96.6 fL (ref 78.0–100.0)
Platelets: 197 10*3/uL (ref 150–400)
RBC: 2.62 MIL/uL — ABNORMAL LOW (ref 4.22–5.81)
RDW: 12.7 % (ref 11.5–15.5)
WBC: 6.3 10*3/uL (ref 4.0–10.5)

## 2017-02-17 LAB — GLUCOSE, CAPILLARY
GLUCOSE-CAPILLARY: 90 mg/dL (ref 65–99)
GLUCOSE-CAPILLARY: 150 mg/dL — AB (ref 65–99)

## 2017-02-17 MED ORDER — LOSARTAN POTASSIUM 25 MG PO TABS
12.5000 mg | ORAL_TABLET | Freq: Every day | ORAL | Status: DC
  Administered 2017-02-17: 12.5 mg via ORAL
  Filled 2017-02-17: qty 1

## 2017-02-17 NOTE — Care Management Note (Signed)
Case Management Note Marvetta Gibbons RN, BSN Unit 2W-Case Manager [phone]  Patient Details  Name: Seth Byrd MRN: [id number] Date of Birth: 02-Nov-1946  Subjective/Objective:   Pt admitted with NSTEMI, s/p CABG- tx from [place] to 2W on 02/16/17                 Action/Plan: PTA pt lived at home- PT eval recommending STSNF- CSW consulted for placement needs.   Expected Discharge Date:                  Expected Discharge Plan:  Skilled Nursing Facility  In-House Referral:  Clinical Social Work  Discharge planning Services  CM Consult  Post Acute Care Choice:  NA Choice offered to:  NA  DME Arranged:    DME Agency:     HH Arranged:    [place] Agency:     Status of Service:  In process, will continue to follow  If discussed at Long Length of Stay Meetings, dates discussed:    Discharge Disposition:   Additional Comments:  Dawayne Patricia, RN 02/17/2017, 10:39 AM

## 2017-02-17 NOTE — Clinical Social Work Note (Signed)
Clinical Social Work Assessment  Patient Details  Name: Seth Byrd MRN: [id number] Date of Birth: March 03, 1946  Date of referral:  02/17/17               Reason for consult:  Facility Placement                Permission sought to share information with:  Facility Sport and exercise psychologist, Family Supports Permission granted to share information::  Yes, Verbal Permission Granted  Name::     Physicist, medical::  SNF  Relationship::  dtr  Contact Information:     Housing/Transportation Living arrangements for the past 2 months:  [place] of Information:  Adult Children Patient Interpreter Needed:  None Criminal Activity/Legal Involvement Pertinent to Current Situation/Hospitalization:  No - Comment as needed Significant Relationships:  Adult Children Lives with:  Self Do you feel safe going back to the place where you live?  Yes Need for family participation in patient care:  No (Coment)  Care giving concerns:  Pt lives alone- has two adult dtrs who can help but wouldn't be able to provide 24 hour supervision and assistance which doctors feel like patient needs following surgery.   Social Worker assessment / plan:  CSW spoke with pt and pt dtr at bedside concerning PT and MD recommendation for SNF.  Patient is alert and oriented but defers to dtr during the conversation.  CSW explained SNF and SNF referral process.  Employment status:  Retired Forensic scientist:  Medicare PT Recommendations:  [place] / Referral to community resources:  [place]  Patient/Family's Response to care:  Patient agreeable to SNF stay but somewhat frustrated that he has to go- expresses understanding of the reasoning behind recommendation but is not enthusiastic about the prospect.  Patient/Family's Understanding of and Emotional Response to Diagnosis, Current Treatment, and Prognosis:  Pt and family express good understanding of patient care  plan and recommendations patient is hopeful that SNF stay will be very short and that he can returning to living independently soon.  Emotional Assessment Appearance:  Appears stated age Attitude/Demeanor/Rapport:    Affect (typically observed):  Accepting, Appropriate Orientation:  Oriented to Self, Oriented to Place, Oriented to  Time, Oriented to Situation Alcohol / Substance use:  Not Applicable Psych involvement (Current and /or in the community):  No (Comment)  Discharge Needs  Concerns to be addressed:  Care Coordination Readmission within the last 30 days:  No Current discharge risk:  Physical Impairment, Lives alone Barriers to Discharge:  Continued Medical Work up   Jorge Ny, LCSW 02/17/2017, 10:11 AM

## 2017-02-17 NOTE — NC FL2 (Signed)
[place] LEVEL OF CARE SCREENING TOOL     IDENTIFICATION  Patient Name: Seth Byrd Birthdate: [birth year] Sex: male Admission Date (Current Location): 02/09/2017  [facility] and [place] Number:  Engineering geologist and Address:  [facility] [place], [place] [address], [place], [place] [address]      Provider Number: [id number]  Attending Physician Name and Address:  [name], MD  Relative Name and Phone Number:       Current Level of Care: Hospital Recommended Level of Care: [place] Prior Approval Number:    Date Approved/Denied:   PASRR Number: [id number]  Discharge Plan: SNF    Current Diagnoses: Patient Active Problem List   Diagnosis Date Noted  . 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 Chest pain, rule out acute myocardial infarction 02/09/2017  . NSTEMI (non-ST elevated myocardial infarction) ([place]) 02/09/2017  . Essential hypertension 11/25/2015  . Hyperlipidemia 11/25/2015  . Panlobular emphysema ([place]) 11/25/2015  . 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 Primary osteoarthritis involving multiple joints 11/25/2015  . Primary osteoarthritis of knee 04/23/2015    Orientation RESPIRATION BLADDER Height & Weight     Self, Time, Situation, Place  O2 (2L [place]) Incontinent Weight: 181 lb 3.2 oz (82.2 kg) Height:  5\' 11"  (180.3 cm)  BEHAVIORAL SYMPTOMS/MOOD NEUROLOGICAL BOWEL NUTRITION STATUS      Continent Diet  AMBULATORY STATUS COMMUNICATION OF NEEDS Skin   Limited Assist Verbally Surgical wounds                       Personal Care Assistance Level of Assistance  Bathing, Dressing Bathing Assistance: Limited assistance   Dressing Assistance: Limited assistance     Functional Limitations Info             SPECIAL CARE FACTORS FREQUENCY  PT (By licensed PT), OT (By licensed OT)     PT Frequency: 5/wk OT Frequency: 5/wk            Contractures      Additional Factors Info  Code  Status, Allergies Code Status Info: FULL Allergies Info: NKA           Current Medications (02/17/2017):  This is the current hospital active medication list Current Facility-Administered Medications  Medication Dose Route Frequency Provider Last Rate Last Dose  . 0.9 %  sodium chloride infusion  250 mL Intravenous PRN [name], MD      . acetaminophen (TYLENOL) tablet 650 mg  650 mg Oral Q6H PRN [name], MD      . aspirin EC tablet 325 mg  325 mg Oral Daily [name], MD      . atorvastatin (LIPITOR) tablet 40 mg  40 mg Oral q1800 Elgie Collard, PA-C   40 mg at 02/16/17 1654  . bisacodyl (DULCOLAX) EC tablet 10 mg  10 mg Oral Daily PRN [name], MD       Or  . bisacodyl (DULCOLAX) suppository 10 mg  10 mg Rectal Daily PRN [name], MD      . carvedilol (COREG) tablet 3.125 mg  3.125 mg Oral BID WC [name], MD   3.125 mg at 02/17/17 0835  . Chlorhexidine Gluconate Cloth 2 % PADS 6 each  6 each Topical Daily [name], MD   6 each at 34/74/25 9563  . folic acid (FOLVITE) tablet 1 mg  1 mg Oral Daily  [name], MD   1 mg at 02/16/17 1009  . furosemide (LASIX) tablet 40 mg  40 mg Oral Daily [name], MD   40 mg at 02/16/17 1520  . insulin aspart (novoLOG) injection 0-24 Units  0-24 Units Subcutaneous TID AC & HS [name], MD      . losartan (COZAAR) tablet 12.5 mg  12.5 mg Oral Daily Amy D Clegg, NP      . ondansetron (ZOFRAN) tablet 4 mg  4 mg Oral Q6H PRN [name], MD       Or  . ondansetron [facility]) injection 4 mg  4 mg Intravenous Q6H PRN [name], MD      . pantoprazole (PROTONIX) EC tablet 40 mg  40 mg Oral QAC breakfast [name], MD   40 mg at 02/17/17 0834  . potassium chloride SA (K-DUR,KLOR-CON) CR tablet 20 mEq  20 mEq Oral Daily [name], MD   20 mEq at 02/16/17 1520  . sodium chloride flush (NS) 0.9 % injection 10-40 mL  10-40 mL Intracatheter PRN [name], MD      . sodium chloride flush (NS) 0.9 % injection 3 mL  3 mL Intravenous Q12H [name], MD      . sodium chloride flush (NS) 0.9 % injection 3 mL  3 mL Intravenous PRN [name], MD      . spironolactone (ALDACTONE) tablet 12.5 mg  12.5 mg Oral Daily [name], MD   12.5 mg at 02/16/17 1010  . traMADol (ULTRAM) tablet 50-100 mg  50-100 mg Oral Q4H PRN [name], MD         Discharge Medications: Please see discharge summary for a list of discharge medications.  Relevant Imaging Results:  Relevant Lab Results:   Additional Information SS#: [id number]  Jorge Ny, LCSW

## 2017-02-17 NOTE — Progress Notes (Signed)
Advanced Heart Failure Rounding Note   Subjective:    02/10/17 S/P CABG x3   HIT ab +. Platelets up to 197   Yesterday diuretics held. Weight down another few pounds.   Overall feeling good. Denies SOB.   Objective:   Weight Range:  Vital Signs:   Temp:  [97.7 F (36.5 C)-98.8 F (37.1 C)] 98.8 F (37.1 C) (04/11 0527) Pulse Rate:  [73-83] 76 (04/11 0527) Resp:  [18-26] 18 (04/11 0527) BP: (107-152)/(51-73) 123/55 (04/11 0527) SpO2:  [92 %-100 %] 92 % (04/11 0527) Weight:  [181 lb 3.2 oz (82.2 kg)] 181 lb 3.2 oz (82.2 kg) (04/11 0527) Last BM Date: 02/16/17  Weight change: Filed Weights   02/15/17 0550 02/16/17 0600 02/17/17 0527  Weight: 186 lb 4.8 oz (84.5 kg) 184 lb 8 oz (83.7 kg) 181 lb 3.2 oz (82.2 kg)    Intake/Output:   Intake/Output Summary (Last 24 hours) at 02/17/17 0826 Last data filed at 02/17/17 0529  Gross per 24 hour  Intake              360 ml  Output              855 ml  Net             -495 ml     Physical Exam: General:  Well appearing. No resp difficulty. In bed.  HEENT: normal Neck: supple. no JVD. Carotids 2+ bilat; no bruits. No lymphadenopathy or thryomegaly appreciated. Cor: PMI nondisplaced. Regular rate & rhythm. No rubs, gallops or murmurs. Lungs: clear Abdomen: soft, nontender, nondistended. No hepatosplenomegaly. No bruits or masses. Good bowel sounds. Extremities: no cyanosis, clubbing, rash, edema. RUE PICC Neuro: alert & orientedx3, cranial nerves grossly intact. moves all 4 extremities w/o difficulty. Affect pleasant Skin: Sternal LLE/RLE incisions approximated. No exudate.    Telemetry: Personally reviewed, NSR 70-80s with PVCs.     Labs: Basic Metabolic Panel:  Recent Labs Lab 02/10/17 2211  02/11/17 0423  02/11/17 1803  02/13/17 0415 02/14/17 0415 02/15/17 0400 02/16/17 0326 02/17/17 0338  NA  --   < > 139  < >  --   < > 139 139 140 141 139  K  --   < > 4.1  < >  --   < > 3.4* 3.3* 3.4* 4.0 4.0  CL  --    < > 110  < >  --   < > 100* 102 101 105 104  CO2  --   --  23  --   --   < > 27 28 27 27 25   GLUCOSE  --   < > 117*  < >  --   < > 106* 102* 97 106* 99  BUN  --   < > 6  < >  --   < > 11 13 16 11 13   CREATININE 0.86  < > 0.83  < > 0.84  < > 0.89 0.84 0.97 0.81 0.95  CALCIUM  --   --  8.4*  --   --   < > 8.9 8.7* 9.0 9.3 9.2  MG 2.5*  --  2.0  --  2.2  --   --   --   --  2.0  --   < > = values in this interval not displayed.  Liver Function Tests: No results for input(s): AST, ALT, ALKPHOS, BILITOT, PROT, ALBUMIN in the last 168 hours. No results for input(s): LIPASE, AMYLASE in  the last 168 hours. No results for input(s): AMMONIA in the last 168 hours.  CBC:  Recent Labs Lab 02/13/17 0415 02/14/17 1041 02/15/17 0400 02/16/17 0326 02/17/17 0338  WBC 7.5 6.1 6.5 6.6 6.3  NEUTROABS  --   --   --  4.5  --   HGB 7.9* 7.8* 7.8* 8.1* 8.3*  HCT 24.5* 23.1* 23.7* 25.1* 25.3*  MCV 96.8 96.7 96.0 96.2 96.6  PLT 79* 120* 151 160 197    Cardiac Enzymes: No results for input(s): CKTOTAL, CKMB, CKMBINDEX, TROPONINI in the last 168 hours.  BNP: BNP (last 3 results) No results for input(s): BNP in the last 8760 hours.  ProBNP (last 3 results) No results for input(s): PROBNP in the last 8760 hours.    Other results:  Imaging: Dg Chest 2 View  Result Date: 02/17/2017 CLINICAL DATA:  Status post CABG seven days ago EXAM: CHEST  2 VIEW COMPARISON:  Portable chest x-ray of February 15, 2017 FINDINGS: The lungs are well-expanded. There remain small bilateral pleural effusions greatest on the right. There is no pneumothorax. Minimal bibasilar densities persist. The heart is mildly enlarged. The pulmonary vascularity is normal. The sternal wires are intact. The PICC line tip projects at the cavoatrial junction. The right internal jugular Cordis sheath has been removed. IMPRESSION: Interval resolution of pulmonary interstitial edema. Minimal persistent bibasilar atelectasis. Further decrease in  size of small bilateral pleural effusions. Stable mild cardiomegaly without pulmonary vascular congestion. Electronically Signed   By: David  [place]ique M.D.   On: 02/17/2017 08:07     Medications:     Scheduled Medications: . aspirin EC  325 mg Oral Daily  . atorvastatin  40 mg Oral q1800  . carvedilol  3.125 mg Oral BID WC  . Chlorhexidine Gluconate Cloth  6 each Topical Daily  . folic acid  1 mg Oral Daily  . furosemide  40 mg Oral Daily  . insulin aspart  0-24 Units Subcutaneous TID AC & HS  . pantoprazole  40 mg Oral QAC breakfast  . potassium chloride  20 mEq Oral Daily  . sodium chloride flush  3 mL Intravenous Q12H  . spironolactone  12.5 mg Oral Daily    Infusions:   PRN Medications: sodium chloride, acetaminophen, bisacodyl **OR** bisacodyl, ondansetron **OR** ondansetron (ZOFRAN) IV, sodium chloride flush, sodium chloride flush, traMADol   Assessment/Plan/Discussion    1. CAD- POD S/P CABG x3. On 4/5 - Suspect EF will recover with revascularization as troponin didn't bump much and no major scar at time of CABG.  2. Acute Systolic Heart Failure- ICM. ECHO EF ~25%.  -Volume status stable. Weight down another few pounds. Hold lasix.  - Continue coreg 3.125 mg BID - Continue spiro 12.5 mg daily.  - Add 12.5 mg losartan. ---> did not get ordered yesterday.   Renal function stable.BMET in am.  3. HLD - Continue statin and ASA 4. Anemia - Stable. 8.3.   - Continue to follow 5. Thrombocytopenia - Hit AB weakly +. Off heparin - Platelets 197.  - No bleeding.  ? If true HIT.  6. Hypokalemia - K 4.0 Stable.   7. R pleural effusion  - CXR today with decreased effusion noted.    Length of Stay: [place], NP  02/17/2017, 8:26 AM  Advanced Heart Failure Team Pager [phone] (M-F; 7a - 4p)  Please contact [facility] for night-coverage after hours (4p -7a ) and weekends on [email or web address]  Patient seen with NP, agree with the above  note.  He looks euvolemic,  continue to hold Lasix.  Add losartan 12.5 mg daily today.  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   Loralie Champagne 02/17/2017 2:44 PM

## 2017-02-17 NOTE — Progress Notes (Addendum)
[address]       [place] [address]             [phone]      7 Days Post-Op Procedure(s) (LRB): CORONARY ARTERY BYPASS GRAFTING (CABG), ON PUMP, TIMES THREE, USING LEFT INTERNAL MAMMARY ARTERY AND ENDOSCOPICALLY HARVESTED BILATERAL GREATER SAPHENOUS VEINS (N/A) TRANSESOPHAGEAL ECHOCARDIOGRAM (TEE) (N/A) Subjective: Feels better each day  Objective: Vital signs in last 24 hours: Temp:  [97.7 F (36.5 C)-98.8 F (37.1 C)] 98.8 F (37.1 C) (04/11 0527) Pulse Rate:  [73-83] 76 (04/11 0527) Cardiac Rhythm: Normal sinus rhythm (04/11 0700) Resp:  [18-26] 18 (04/11 0527) BP: (107-152)/(51-73) 123/55 (04/11 0527) SpO2:  [92 %-100 %] 92 % (04/11 0527) Weight:  [181 lb 3.2 oz (82.2 kg)] 181 lb 3.2 oz (82.2 kg) (04/11 0527)  Hemodynamic parameters for last 24 hours:    Intake/Output from previous day: 04/10 0701 - 04/11 0700 In: 360 [P.O.:360] Out: 855 [Urine:855] Intake/Output this shift: No intake/output data recorded.  General appearance: alert, cooperative and no distress Heart: regular rate and rhythm and freq extrasystole Lungs: clear to auscultation bilaterally Abdomen: benign Extremities: benign Wound: incis healing well Ext- min edema  Lab Results:  Recent Labs  02/16/17 0326 02/17/17 0338  WBC 6.6 6.3  HGB 8.1* 8.3*  HCT 25.1* 25.3*  PLT 160 197   BMET:  Recent Labs  02/16/17 0326 02/17/17 0338  NA 141 139  K 4.0 4.0  CL 105 104  CO2 27 25  GLUCOSE 106* 99  BUN 11 13  CREATININE 0.81 0.95  CALCIUM 9.3 9.2    PT/INR: No results for input(s): LABPROT, INR in the last 72 hours. ABG    Component Value Date/Time   PHART 7.372 02/11/2017 0007   HCO3 21.6 02/11/2017 0007   TCO2 24 02/11/2017 1754   ACIDBASEDEF 3.0 (H) 02/11/2017 0007   O2SAT 56.8 02/12/2017 0823   CBG (last 3)   Recent Labs  02/16/17 1637 02/16/17 2053 02/17/17 0631  GLUCAP 98 90 90    Meds Scheduled Meds: . aspirin EC  325 mg Oral Daily    . atorvastatin  40 mg Oral q1800  . carvedilol  3.125 mg Oral BID WC  . Chlorhexidine Gluconate Cloth  6 each Topical Daily  . folic acid  1 mg Oral Daily  . furosemide  40 mg Oral Daily  . insulin aspart  0-24 Units Subcutaneous TID AC & HS  . pantoprazole  40 mg Oral QAC breakfast  . potassium chloride  20 mEq Oral Daily  . sodium chloride flush  3 mL Intravenous Q12H  . spironolactone  12.5 mg Oral Daily   Continuous Infusions: PRN Meds:.sodium chloride, acetaminophen, bisacodyl **OR** bisacodyl, ondansetron **OR** ondansetron (ZOFRAN) IV, sodium chloride flush, sodium chloride flush, traMADol  Xrays No results found.  Assessment/Plan: S/P Procedure(s) (LRB): CORONARY ARTERY BYPASS GRAFTING (CABG), ON PUMP, TIMES THREE, USING LEFT INTERNAL MAMMARY ARTERY AND ENDOSCOPICALLY HARVESTED BILATERAL GREATER SAPHENOUS VEINS (N/A) TRANSESOPHAGEAL ECHOCARDIOGRAM (TEE) (N/A)  1 steady progress 2 wean O2, routine pulm toilet. rehab 3 gentle diuresis- cont current diuretics per AHF team 4 sugars fine- A1C 5- d/c SSI 5 some HTN with freq PVC's - AHF team managing currently 6 H/H stable 7 renal fxn -stable   LOS: 8 days    GOLD,[name] 02/17/2017 Poss home 1-2 more days  I have seen and examined Seth Byrd and agree with the above assessment  and plan.  Lilia Argue  Servando Snare MD Beeper [phone] Office [phone] 02/17/2017 8:42 AM

## 2017-02-17 NOTE — Progress Notes (Addendum)
CARDIAC REHAB PHASE I   PRE:  Rate/Rhythm:83 SR  BP:  Sitting: 125/58        SaO2: 93 1 L  MODE:  Ambulation: 350 ft   POST:  Rate/Rhythm: 101 ST c/ PVCs  BP:  Sitting: 118/63         SaO2: 97 1L  Pt required moderate assistance from lying to sitting position in bed, verbal reminder cues for sternal precautions. Pt able to stand with minimal assist. Pt ambulated 350 ft on 1L O2, rolling walker, assist x1, slow, mostly steady gait, tolerated fairly well, denies any complaints, standing rest x2. Encouraged IS, additional ambulation x2 today. Pt to recliner after walk, feet elevated, call bell within reach. Will follow.   [phone] Lenna Sciara, RN, BSN 02/17/2017 10:58 AM

## 2017-02-17 NOTE — Progress Notes (Signed)
Physical Therapy Treatment Patient Details Name: Seth Byrd MRN: [id number] DOB: 1946-11-09 Today's Date: 02/17/2017    History of Present Illness  [name] is a 71 year old AA male with a history of HTN, RA,prostate CA, hyperlipidemia, and former smoker.  Admitted 02/09/17 with NSTEMI, now s/p CABG x 3 on 02/10/17.    PT Comments    Pt was tired when PT first arrived to see him but on return he agreed to walk.  He had O2 sats of 96% on room air, was able to maintain pulses in 81-88 range with walking at slow pace and two standing rests.  Will continue acutely for the recovery of mobility and progression of strengthening and efforts to move with maintenance of sternal precautions.  Follow Up Recommendations  SNF     Equipment Recommendations  None recommended by PT    Recommendations for Other Services       Precautions / Restrictions Precautions Precautions: Fall;Sternal Restrictions Weight Bearing Restrictions: Yes Other Position/Activity Restrictions: sternal precautions    Mobility  Bed Mobility Overal bed mobility: Needs Assistance Bed Mobility: Supine to Sit;Sit to Supine   Sidelying to sit: Min assist     Sit to sidelying: Mod assist General bed mobility comments: Assist to bring legs up and lower trunk.  Transfers Overall transfer level: Needs assistance Equipment used: Rolling walker (2 wheeled) Transfers: Sit to/from Stand Sit to Stand: Min assist            Ambulation/Gait Ambulation/Gait assistance: Min assist Ambulation Distance (Feet): 300 Feet Assistive device: Rolling walker (2 wheeled) Gait Pattern/deviations: Step-through pattern;Decreased stride length;Wide base of support;Trunk flexed Gait velocity: decreased Gait velocity interpretation: Below normal speed for age/gender General Gait Details: took lengthy time for walk with short standing break and used RW for support of balance    Stairs            Wheelchair Mobility     Modified Rankin (Stroke Patients Only)       Balance     Sitting balance-Leahy Scale: Good       Standing balance-Leahy Scale: Fair                              Cognition Arousal/Alertness: Awake/alert Behavior During Therapy: WFL for tasks assessed/performed Overall Cognitive Status: Within Functional Limits for tasks assessed                                        Exercises      General Comments        Pertinent Vitals/Pain Pain Assessment: No/denies pain    Home Living                      Prior Function            PT Goals (current goals can now be found in the care plan section) Progress towards PT goals: Progressing toward goals    Frequency    Min 2X/week      PT Plan Current plan remains appropriate    Co-evaluation             End of Session Equipment Utilized During Treatment: Gait belt Activity Tolerance: Patient tolerated treatment well Patient left: in bed;with call bell/phone within reach;with bed alarm set Nurse Communication: Mobility status PT Visit Diagnosis: Muscle weakness (  generalized) (M62.81);Other abnormalities of gait and mobility (R26.89)     Time: 1431-1456 PT Time Calculation (min) (ACUTE ONLY): 25 min  Charges:  $Gait Training: 8-22 mins $Therapeutic Activity: 8-22 mins                    G Codes:        Ramond Dial 02/28/2017, 4:10 PM   Mee Hives, PT MS Acute Rehab Dept. Number: [place] and [place]

## 2017-02-17 NOTE — Discharge Instructions (Signed)
Heart Failure Heart failure means your heart has trouble pumping blood. This makes it hard for your body to work well. Heart failure is usually a long-term (chronic) condition. You must take good care of yourself and follow your doctor's treatment plan. Follow these instructions at home:  Take your heart medicine as told by your doctor.  Do not stop taking medicine unless your doctor tells you to.  Do not skip any dose of medicine.  Refill your medicines before they run out.  Take other medicines only as told by your doctor or pharmacist.  Stay active if told by your doctor. The elderly and people with severe heart failure should talk with a doctor about physical activity.  Eat heart-healthy foods. Choose foods that are without trans fat and are low in saturated fat, cholesterol, and salt (sodium). This includes fresh or frozen fruits and vegetables, fish, lean meats, fat-free or low-fat dairy foods, whole grains, and high-fiber foods. Lentils and dried peas and beans (legumes) are also good choices.  Limit salt if told by your doctor.  Cook in a healthy way. Roast, grill, broil, bake, poach, steam, or stir-fry foods.  Limit fluids as told by your doctor.  Weigh yourself every morning. Do this after you pee (urinate) and before you eat breakfast. Write down your weight to give to your doctor.  Take your blood pressure and write it down if your doctor tells you to.  Ask your doctor how to check your pulse. Check your pulse as told.  Lose weight if told by your doctor.  Stop smoking or chewing tobacco. Do not use gum or patches that help you quit without your doctor's approval.  Schedule and go to doctor visits as told.  Nonpregnant women should have no more than 1 drink a day. Men should have no more than 2 drinks a day. Talk to your doctor about drinking alcohol.  Stop illegal drug use.  Stay current with shots (immunizations).  Manage your health conditions as told by your  doctor.  Learn to manage your stress.  Rest when you are tired.  If it is really hot outside:  Avoid intense activities.  Use air conditioning or fans, or get in a cooler place.  Avoid caffeine and alcohol.  Wear loose-fitting, lightweight, and light-colored clothing.  If it is really cold outside:  Avoid intense activities.  Layer your clothing.  Wear mittens or gloves, a hat, and a scarf when going outside.  Avoid alcohol.  Learn about heart failure and get support as needed.  Get help to maintain or improve your quality of life and your ability to care for yourself as needed. Contact a doctor if:  You gain weight quickly.  You are more short of breath than usual.  You cannot do your normal activities.  You tire easily.  You cough more than normal, especially with activity.  You have any or more puffiness (swelling) in areas such as your hands, feet, ankles, or belly (abdomen).  You cannot sleep because it is hard to breathe.  You feel like your heart is beating fast (palpitations).  You get dizzy or light-headed when you stand up. Get help right away if:  You have trouble breathing.  There is a change in mental status, such as becoming less alert or not being able to focus.  You have chest pain or discomfort.  You faint. This information is not intended to replace advice given to you by your health care provider. Make sure you  discuss any questions you have with your health care provider. Document Released: 08/04/2008 Document Revised: 04/02/2016 Document Reviewed: 12/12/2012 Elsevier Interactive Patient Education  2017 [employer] Coronary Artery Bypass Grafting, Care After   This sheet gives you information about how to care for yourself after your procedure. Your health care provider may also give you more specific instructions. If you have problems or questions, contact your health care provider. What can I expect after the procedure? After  the procedure, it is common to have:  Nausea and a lack of appetite.  Constipation.  Weakness and fatigue.  Depression or irritability.  Pain or discomfort in your incision areas. Follow these instructions at home: Medicines   Take over-the-counter and prescription medicines only as told by your health care provider. Do not stop taking medicines or start any new medicines without approval from your health care provider.  If you were prescribed an antibiotic medicine, take it as told by your health care provider. Do not stop taking the antibiotic even if you start to feel better.  Do not drive or use heavy machinery while taking prescription pain medicine. Incision care   Follow instructions from your health care provider about how to take care of your incisions. Make sure you:  Wash your hands with soap and water before you change your bandage (dressing). If soap and water are not available, use hand sanitizer.  Change your dressing as told by your health care provider.  Leave stitches (sutures), skin glue, or adhesive strips in place. These skin closures may need to stay in place for 2 weeks or longer. If adhesive strip edges start to loosen and curl up, you may trim the loose edges. Do not remove adhesive strips completely unless your health care provider tells you to do that.  Keep incision areas clean, dry, and protected.  Check your incision areas every day for signs of infection. Check for:  More redness, swelling, or pain.  More fluid or blood.  Warmth.  Pus or a bad smell.  If incisions were made in your legs:  Avoid crossing your legs.  Avoid sitting for long periods of time. Change positions every 30 minutes.  Raise (elevate) your legs when you are sitting. Bathing   Do not take baths, swim, or use a hot tub until your health care provider approves.  Only take sponge baths. Pat the incisions dry. Do not rub incisions with a washcloth or towel.  Ask your  health care provider when you can shower. Eating and drinking   Eat foods that are high in fiber, such as raw fruits and vegetables, whole grains, beans, and nuts. Meats should be lean cut. Avoid canned, processed, and fried foods. This can help prevent constipation and is a recommended part of a heart-healthy diet.  Drink enough fluid to keep your urine clear or pale yellow.  Limit alcohol intake to no more than 1 drink a day for nonpregnant women and 2 drinks a day for men. One drink equals 12 oz of beer, 5 oz of wine, or 1 oz of hard liquor. Activity   Rest and limit your activity as told by your health care provider. You may be instructed to:  Stop any activity right away if you have chest pain, shortness of breath, irregular heartbeats, or dizziness. Get help right away if you have any of these symptoms.  Move around frequently for short periods or take short walks as directed by your health care provider. Gradually increase your activities.  You may need physical therapy or cardiac rehabilitation to help strengthen your muscles and build your endurance.  Avoid lifting, pushing, or pulling anything that is heavier than 10 lb (4.5 kg) for at least 6 weeks or as told by your health care provider.  Do not drive until your health care provider approves.  Ask your health care provider when you may return to work.  Ask your health care provider when you may resume sexual activity. General instructions   Do not use any products that contain nicotine or tobacco, such as cigarettes and e-cigarettes. If you need help quitting, ask your health care provider.  Take 2-3 deep breaths every few hours during the day, while you recover. This helps expand your lungs and prevent complications like pneumonia after surgery.  If you were given a device called an incentive spirometer, use it several times a day to practice deep breathing. Support your chest with a pillow or your arms when you take deep  breaths or cough.  Wear compression stockings as told by your health care provider. These stockings help to prevent blood clots and reduce swelling in your legs.  Weigh yourself every day. This helps identify if your body is holding (retaining) fluid that may make your heart and lungs work harder.  Keep all follow-up visits as told by your health care provider. This is important. Contact a health care provider if:  You have more redness, swelling, or pain around any incision.  You have more fluid or blood coming from any incision.  Any incision feels warm to the touch.  You have pus or a bad smell coming from any incision  You have a fever.  You have swelling in your ankles or legs.  You have pain in your legs.  You gain 2 lb (0.9 kg) or more a day.  You are nauseous or you vomit.  You have diarrhea. Get help right away if:  You have chest pain that spreads to your jaw or arms.  You are short of breath.  You have a fast or irregular heartbeat.  You notice a "clicking" in your breastbone (sternum) when you move.  You have numbness or weakness in your arms or legs.  You feel dizzy or light-headed. Summary  After the procedure, it is common to have pain or discomfort in the incision areas.  Do not take baths, swim, or use a hot tub until your health care provider approves.  Gradually increase your activities. You may need physical therapy or cardiac rehabilitation to help strengthen your muscles and build your endurance.  Weigh yourself every day. This helps identify if your body is holding (retaining) fluid that may make your heart and lungs work harder. This information is not intended to replace advice given to you by your health care provider. Make sure you discuss any questions you have with your health care provider. Document Released: 05/15/2005 Document Revised: 09/14/2016 Document Reviewed: 09/14/2016 Elsevier Interactive Patient Education  2017 [employer].

## 2017-02-18 LAB — CBC WITH DIFFERENTIAL/PLATELET
Basophils Absolute: 0 10*3/uL (ref 0.0–0.1)
Basophils Relative: 0 %
EOS ABS: 0.3 10*3/uL (ref 0.0–0.7)
Eosinophils Relative: 4 %
HCT: 24.7 % — ABNORMAL LOW (ref 39.0–52.0)
HEMOGLOBIN: 7.8 g/dL — AB (ref 13.0–17.0)
LYMPHS ABS: 1.2 10*3/uL (ref 0.7–4.0)
LYMPHS PCT: 17 %
MCH: 31 pg (ref 26.0–34.0)
MCHC: 31.6 g/dL (ref 30.0–36.0)
MCV: 98 fL (ref 78.0–100.0)
MONOS PCT: 13 %
Monocytes Absolute: 0.9 10*3/uL (ref 0.1–1.0)
NEUTROS PCT: 66 %
Neutro Abs: 4.6 10*3/uL (ref 1.7–7.7)
Platelets: 263 10*3/uL (ref 150–400)
RBC: 2.52 MIL/uL — AB (ref 4.22–5.81)
RDW: 12.9 % (ref 11.5–15.5)
WBC: 7 10*3/uL (ref 4.0–10.5)

## 2017-02-18 LAB — BASIC METABOLIC PANEL
Anion gap: 9 (ref 5–15)
BUN: 19 mg/dL (ref 6–20)
CHLORIDE: 105 mmol/L (ref 101–111)
CO2: 27 mmol/L (ref 22–32)
CREATININE: 0.99 mg/dL (ref 0.61–1.24)
Calcium: 9.5 mg/dL (ref 8.9–10.3)
GFR calc Af Amer: >60 mL/min (ref >60)
GFR calc non Af Amer: >60 mL/min (ref >60)
Glucose, Bld: 96 mg/dL (ref 65–99)
POTASSIUM: 4.3 mmol/L (ref 3.5–5.1)
SODIUM: 141 mmol/L (ref 135–145)

## 2017-02-18 MED ORDER — LOSARTAN POTASSIUM 25 MG PO TABS
25.0000 mg | ORAL_TABLET | Freq: Every day | ORAL | Status: DC
  Administered 2017-02-18 – 2017-02-19 (×2): 25 mg via ORAL
  Filled 2017-02-18 (×2): qty 1

## 2017-02-18 NOTE — Progress Notes (Signed)
CSW met patient and daughter at bedside to discuss SNF placement. Family would like a private bed at [facility] and if they are unable to discharge to that facility. They have chosen [facility] as a back up. CSW will reach out [facility] and express patients request. CSW remains available for support an discharge needs.   Rhea Pink, MSW,  [place]

## 2017-02-18 NOTE — Progress Notes (Addendum)
OT Cancellation Note  Patient Details Name: Petra Sargeant MRN: [id number] DOB: [birth year]   Cancelled Treatment:    Reason Eval/Treat Not Completed: Patient not medically ready (pt on bedrest x1 hour following removal of EPW). Will follow up for OT eval as time allows.  Binnie Kand M.S., OTR/L Pager: [phone]  02/18/2017, 10:13 AM

## 2017-02-18 NOTE — Progress Notes (Addendum)
      [address]       [place], [address]             [phone]      8 Days Post-Op Procedure(s) (LRB): CORONARY ARTERY BYPASS GRAFTING (CABG), ON PUMP, TIMES THREE, USING LEFT INTERNAL MAMMARY ARTERY AND ENDOSCOPICALLY HARVESTED BILATERAL GREATER SAPHENOUS VEINS (N/A) TRANSESOPHAGEAL ECHOCARDIOGRAM (TEE) (N/A) Subjective: Feels good this morning.   Objective: Vital signs in last 24 hours: Temp:  [98.4 F (36.9 C)-98.7 F (37.1 C)] 98.7 F (37.1 C) (04/12 0346) Pulse Rate:  [75-84] 75 (04/12 0346) Cardiac Rhythm: Normal sinus rhythm;Bundle branch block (04/11 1900) Resp:  [18] 18 (04/12 0346) BP: (98-120)/(50-58) 120/55 (04/12 0346) SpO2:  [91 %-97 %] 91 % (04/12 0346) Weight:  [81.9 kg (180 lb 8 oz)] 81.9 kg (180 lb 8 oz) (04/12 0346)    Intake/Output from previous day: 04/11 0701 - 04/12 0700 In: 0  Out: 500 [Urine:500] Intake/Output this shift: Total I/O In: -  Out: 300 [Urine:300]  General appearance: alert, cooperative and no distress Heart: regular rate and rhythm, S1, S2 normal, no murmur, click, rub or gallop Lungs: clear to auscultation bilaterally Abdomen: soft, non-tender; bowel sounds normal; no masses,  no organomegaly Extremities: extremities normal, atraumatic, no cyanosis or edema Wound: clean and dry  Lab Results:  Recent Labs  02/17/17 0338 02/18/17 0401  WBC 6.3 7.0  HGB 8.3* 7.8*  HCT 25.3* 24.7*  PLT 197 263   BMET:  Recent Labs  02/17/17 0338 02/18/17 0401  NA 139 141  K 4.0 4.3  CL 104 105  CO2 25 27  GLUCOSE 99 96  BUN 13 19  CREATININE 0.95 0.99  CALCIUM 9.2 9.5    PT/INR: No results for input(s): LABPROT, INR in the last 72 hours. ABG    Component Value Date/Time   PHART 7.372 02/11/2017 0007   HCO3 21.6 02/11/2017 0007   TCO2 24 02/11/2017 1754   ACIDBASEDEF 3.0 (H) 02/11/2017 0007   O2SAT 56.8 02/12/2017 0823   CBG (last 3)   Recent Labs  02/16/17 2053 02/17/17 0631 02/17/17 1103    GLUCAP 90 90 150*    Assessment/Plan: S/P Procedure(s) (LRB): CORONARY ARTERY BYPASS GRAFTING (CABG), ON PUMP, TIMES THREE, USING LEFT INTERNAL MAMMARY ARTERY AND ENDOSCOPICALLY HARVESTED BILATERAL GREATER SAPHENOUS VEINS (N/A) TRANSESOPHAGEAL ECHOCARDIOGRAM (TEE) (N/A)  1 steady progress, recovering well 2 tolerating room air, encouraged IS 3 gentle diuresis- cont current diuretics per AHF team. Weight continued to trend down. 4 good blood glucose control. A1C 5 5 BP better control this morning. HR NSR in the 70s with occasional PVC. Continue coreg, cozaar, aldactone, and lasix 6 H/H stable 7 renal fxn -stable with good electrolytes  Plan: Case management and OT consulted yesterday to assist in facility placement. Will await recommendations. EPW out today.    LOS: 9 days    Seth Byrd 02/18/2017  poss to snf tomorrow I have seen and examined Seth Byrd and agree with the above assessment  and plan.  [name] MD Beeper [phone] Office [phone] 02/18/2017 12:48 PM

## 2017-02-18 NOTE — Progress Notes (Signed)
Advanced Heart Failure Rounding Note   Subjective:    02/10/17 S/P CABG x3   HIT ab +. Platelets up to 197   Yesterday losartan started. Renal function stable. Hgb down to 7.8. No obvious source.   Denies SOB. Hungry.   Objective:   Weight Range:  Vital Signs:   Temp:  [98.4 F (36.9 C)-98.7 F (37.1 C)] 98.7 F (37.1 C) (04/12 0346) Pulse Rate:  [75-84] 75 (04/12 0346) Resp:  [18] 18 (04/12 0346) BP: (98-120)/(50-58) 120/55 (04/12 0346) SpO2:  [91 %-97 %] 91 % (04/12 0346) Weight:  [180 lb 8 oz (81.9 kg)] 180 lb 8 oz (81.9 kg) (04/12 0346) Last BM Date: 02/17/17  Weight change: Filed Weights   02/16/17 0600 02/17/17 0527 02/18/17 0346  Weight: 184 lb 8 oz (83.7 kg) 181 lb 3.2 oz (82.2 kg) 180 lb 8 oz (81.9 kg)    Intake/Output:   Intake/Output Summary (Last 24 hours) at 02/18/17 0803 Last data filed at 02/18/17 0727  Gross per 24 hour  Intake                0 ml  Output              800 ml  Net             -800 ml     Physical Exam: General:  Well appearing. No resp difficulty. In bed.  HEENT: normal Neck: supple. no JVD. Carotids 2+ bilat; no bruits. No lymphadenopathy or thryomegaly appreciated. Cor: PMI nondisplaced. Regular rate & rhythm. No rubs, gallops or murmurs. Lungs: clear Abdomen: soft, nontender, nondistended. No hepatosplenomegaly. No bruits or masses. Good bowel sounds. Extremities: no cyanosis, clubbing, rash, edema. RUE PICC Neuro: alert & orientedx3, cranial nerves grossly intact. moves all 4 extremities w/o difficulty. Affect pleasant Skin: Sternal LLE/RLE incisions approximated. No exudate.    Telemetry: Personally reviewed, NSR 70-80s with PVCs.     Labs: Basic Metabolic Panel:  Recent Labs Lab 02/11/17 1803  02/14/17 0415 02/15/17 0400 02/16/17 0326 02/17/17 0338 02/18/17 0401  NA  --   < > 139 140 141 139 141  K  --   < > 3.3* 3.4* 4.0 4.0 4.3  CL  --   < > 102 101 105 104 105  CO2  --   < > 28 27 27 25 27   GLUCOSE   --   < > 102* 97 106* 99 96  BUN  --   < > 13 16 11 13 19   CREATININE 0.84  < > 0.84 0.97 0.81 0.95 0.99  CALCIUM  --   < > 8.7* 9.0 9.3 9.2 9.5  MG 2.2  --   --   --  2.0  --   --   < > = values in this interval not displayed.  Liver Function Tests: No results for input(s): AST, ALT, ALKPHOS, BILITOT, PROT, ALBUMIN in the last 168 hours. No results for input(s): LIPASE, AMYLASE in the last 168 hours. No results for input(s): AMMONIA in the last 168 hours.  CBC:  Recent Labs Lab 02/14/17 1041 02/15/17 0400 02/16/17 0326 02/17/17 0338 02/18/17 0401  WBC 6.1 6.5 6.6 6.3 7.0  NEUTROABS  --   --  4.5  --  4.6  HGB 7.8* 7.8* 8.1* 8.3* 7.8*  HCT 23.1* 23.7* 25.1* 25.3* 24.7*  MCV 96.7 96.0 96.2 96.6 98.0  PLT 120* 151 160 197 263    Cardiac Enzymes: No results for input(s):  CKTOTAL, CKMB, CKMBINDEX, TROPONINI in the last 168 hours.  BNP: BNP (last 3 results) No results for input(s): BNP in the last 8760 hours.  ProBNP (last 3 results) No results for input(s): PROBNP in the last 8760 hours.    Other results:  Imaging: Dg Chest 2 View  Result Date: 02/17/2017 CLINICAL DATA:  Status post CABG seven days ago EXAM: CHEST  2 VIEW COMPARISON:  Portable chest x-ray of February 15, 2017 FINDINGS: The lungs are well-expanded. There remain small bilateral pleural effusions greatest on the right. There is no pneumothorax. Minimal bibasilar densities persist. The heart is mildly enlarged. The pulmonary vascularity is normal. The sternal wires are intact. The PICC line tip projects at the cavoatrial junction. The right internal jugular Cordis sheath has been removed. IMPRESSION: Interval resolution of pulmonary interstitial edema. Minimal persistent bibasilar atelectasis. Further decrease in size of small bilateral pleural effusions. Stable mild cardiomegaly without pulmonary vascular congestion. Electronically Signed   By: David  Martinique M.D.   On: 02/17/2017 08:07     Medications:      Scheduled Medications: . aspirin EC  325 mg Oral Daily  . atorvastatin  40 mg Oral q1800  . carvedilol  3.125 mg Oral BID WC  . folic acid  1 mg Oral Daily  . furosemide  40 mg Oral Daily  . losartan  12.5 mg Oral Daily  . pantoprazole  40 mg Oral QAC breakfast  . potassium chloride  20 mEq Oral Daily  . sodium chloride flush  3 mL Intravenous Q12H  . spironolactone  12.5 mg Oral Daily    Infusions:   PRN Medications: sodium chloride, acetaminophen, bisacodyl **OR** bisacodyl, ondansetron **OR** ondansetron (ZOFRAN) IV, sodium chloride flush, sodium chloride flush, traMADol   Assessment/Plan/Discussion    1. CAD- POD S/P CABG x3. On 4/5 - Suspect EF will recover with revascularization as troponin didn't bump much and no major scar at time of CABG.  2. Acute Systolic Heart Failure- ICM. ECHO EF ~25%.  -Volume status stable. Hold lasix.   - Continue coreg 3.125 mg BID - Continue spiro 12.5 mg daily.  - Increase losartan to 25 mg daily.  - BMET in am.    3. HLD - Continue statin and ASA 4. Anemia - Hgb down to 7.8. No obvious source.  5. Thrombocytopenia - Hit AB weakly +. Off heparin.  Waiting for SRA.  - Platelets 197.  - No bleeding.  ? If true HIT. SRA pending.   6. Hypokalemia - K 4.3 Stable.   7. R pleural effusion  - CXR today with decreased effusion noted.    Length of Stay: [place], NP  02/18/2017, 8:03 AM  Advanced Heart Failure Team Pager [phone] (M-F; 7a - 4p)  Please contact [facility] for night-coverage after hours (4p -7a ) and weekends on [email or web address]  Patient seen with NP, agree with the above note.  Awaiting SRA to confirm HIT.  Stable otherwise.  Increase losartan as above, continue to follow labs.   Loralie Champagne 02/18/2017 4:34 PM

## 2017-02-18 NOTE — Progress Notes (Signed)
EPW d/c'd per order and per protocol. Tips intact. VSS. Pt educated on need for bedrest x 1h and q11m vitals. Call bell and phone within reach. Will continue to monitor.

## 2017-02-18 NOTE — Progress Notes (Signed)
CARDIAC REHAB PHASE I   PRE:  Rate/Rhythm: 78 SR  BP:  Sitting: 111/61        SaO2: 94 RA  MODE:  Ambulation: 380 ft   POST:  Rate/Rhythm: 100 SR  BP:  Sitting: 122/66         SaO2: 98 RA  Pt still requires some assistance out of bed, needs cues for sternal precautions. Pt ambulated 380 ft on RA, rolling walker, hand held assist, slow, steady gait, (however, pt does need cues to maintain safe technique with RW), tolerated well with no complaints. Encouraged IS, additional ambulation x 2 today. Pt to recliner after walk, call bell within reach. Will follow.  [place], RN, BSN 02/18/2017 12:00 PM

## 2017-02-18 NOTE — Evaluation (Signed)
Occupational Therapy Evaluation Patient Details Name: Seth Byrd MRN: [id number] DOB: 20-Jun-1946 Today's Date: 02/18/2017    History of Present Illness  Seth Byrd is a 71 year old AA male with a history of HTN, RA,prostate CA, hyperlipidemia, and former smoker.  Admitted 02/09/17 with NSTEMI, now s/p CABG x 3 on 02/10/17.   Clinical Impression   Pt reports he was independent with ADL and mobility PTA. Currently pt overall min guard for functional mobility and min-mod assist for ADL. Pt unable to recall sternal precautions at start of session; educated on sternal precautions but pt needs consistent cues to maintain during functional activities. Recommending SNF for follow up to maximize independence and safety with ADL and functional mobility prior to return home alone. Pt would benefit from continued skilled OT to address established goals.    Follow Up Recommendations  SNF    Equipment Recommendations  Other (comment) (TBD at next venue)    Recommendations for Other Services       Precautions / Restrictions Precautions Precautions: Fall;Sternal Precaution Comments: Pt able to recall 0/5 sternal precautions. Educated pt and daughter on sternal precautions during functional activities Restrictions Weight Bearing Restrictions: Yes Other Position/Activity Restrictions: Sternal precautions      Mobility Bed Mobility Overal bed mobility: Needs Assistance Bed Mobility: Sidelying to Sit;Rolling Rolling: Min guard Sidelying to sit: Min assist       General bed mobility comments: Min assist for trunk elevation to sitting. Cues throughout for sternal precautions and use of pillow to maintain precautions  Transfers Overall transfer level: Needs assistance Equipment used: Rolling walker (2 wheeled) Transfers: Sit to/from Stand Sit to Stand: Min guard         General transfer comment: Cues to maintain sternal precautions with use of pillow to maintain    Balance Overall  balance assessment: Needs assistance Sitting-balance support: No upper extremity supported;Feet supported Sitting balance-Leahy Scale: Good     Standing balance support: No upper extremity supported;During functional activity Standing balance-Leahy Scale: Fair                             ADL either performed or assessed with clinical judgement   ADL Overall ADL's : Needs assistance/impaired Eating/Feeding: Independent;Sitting   Grooming: Min guard;Standing;Wash/dry hands   Upper Body Bathing: Minimal assistance;Sitting   Lower Body Bathing: Moderate assistance;Sit to/from stand   Upper Body Dressing : Minimal assistance;Sitting   Lower Body Dressing: Moderate assistance;Sit to/from stand   Toilet Transfer: Min guard;Ambulation Toilet Transfer Details (indicate cue type and reason): Simulated by sit to stand from EOB with functional mobility. Pt able to stand at toilet to void         Functional mobility during ADLs: Min guard;Rolling walker General ADL Comments: Cues throughout to maintain sternal precautions; use of pillow improved pt adhering to precautions     Vision         Perception     Praxis      Pertinent Vitals/Pain Pain Assessment: No/denies pain     Hand Dominance Right   Extremity/Trunk Assessment Upper Extremity Assessment Upper Extremity Assessment: Overall WFL for tasks assessed (difficult to assess due to sternal precautions)   Lower Extremity Assessment Lower Extremity Assessment: Defer to PT evaluation       Communication Communication Communication: No difficulties   Cognition Arousal/Alertness: Awake/alert Behavior During Therapy: WFL for tasks assessed/performed Overall Cognitive Status: Within Functional Limits for tasks assessed  General Comments       Exercises     Shoulder Instructions      Home Living Family/patient expects to be discharged to:: Skilled  nursing facility Living Arrangements: Alone                                      Prior Functioning/Environment Level of Independence: Independent        Comments: works for [employer]        OT Problem List: Decreased strength;Decreased activity tolerance;Impaired balance (sitting and/or standing);Decreased knowledge of use of DME or AE;Decreased knowledge of precautions;Cardiopulmonary status limiting activity      OT Treatment/Interventions: Self-care/ADL training;Energy conservation;DME and/or AE instruction;Therapeutic activities;Patient/family education;Balance training    OT Goals(Current goals can be found in the care plan section) Acute Rehab OT Goals Patient Stated Goal: To return to independent OT Goal Formulation: With patient/family Time For Goal Achievement: 03/04/17 Potential to Achieve Goals: Good ADL Goals Pt Will Perform Lower Body Bathing: with supervision;sit to/from stand Pt Will Perform Lower Body Dressing: with supervision;sit to/from stand Pt Will Transfer to Toilet: with supervision;ambulating;regular height toilet Pt Will Perform Toileting - Clothing Manipulation and hygiene: with supervision;sit to/from stand Additional ADL Goal #1: Pt will independently verbally recall sternal precautions and maintain throughout ADL.  OT Frequency: Min 2X/week   Barriers to D/C: Decreased caregiver support  pt lives alone       Co-evaluation              End of Session Equipment Utilized During Treatment: Surveyor, mining Communication: Mobility status  Activity Tolerance: Patient tolerated treatment well Patient left: in chair;with call bell/phone within reach;with family/visitor present  OT Visit Diagnosis: Unsteadiness on feet (R26.81)                Time: 8657-8469 OT Time Calculation (min): 16 min Charges:  OT General Charges $OT Visit: 1 Procedure OT Evaluation $OT Eval Moderate Complexity: 1 Procedure G-Codes:     Seth Byrd  A. Ulice Brilliant, M.S., OTR/L Pager: [place] 02/18/2017, 2:59 PM

## 2017-02-19 LAB — CBC WITH DIFFERENTIAL/PLATELET
BASOS ABS: 0 10*3/uL (ref 0.0–0.1)
Basophils Relative: 0 %
EOS PCT: 4 %
Eosinophils Absolute: 0.3 10*3/uL (ref 0.0–0.7)
HCT: 26.7 % — ABNORMAL LOW (ref 39.0–52.0)
Hemoglobin: 8.4 g/dL — ABNORMAL LOW (ref 13.0–17.0)
LYMPHS PCT: 27 %
Lymphs Abs: 2.1 10*3/uL (ref 0.7–4.0)
MCH: 30.4 pg (ref 26.0–34.0)
MCHC: 31.5 g/dL (ref 30.0–36.0)
MCV: 96.7 fL (ref 78.0–100.0)
MONO ABS: 0.7 10*3/uL (ref 0.1–1.0)
MONOS PCT: 10 %
Neutro Abs: 4.5 10*3/uL (ref 1.7–7.7)
Neutrophils Relative %: 59 %
PLATELETS: 352 10*3/uL (ref 150–400)
RBC: 2.76 MIL/uL — ABNORMAL LOW (ref 4.22–5.81)
RDW: 12.8 % (ref 11.5–15.5)
WBC: 7.7 10*3/uL (ref 4.0–10.5)

## 2017-02-19 LAB — BASIC METABOLIC PANEL
Anion gap: 10 (ref 5–15)
BUN: 24 mg/dL — AB (ref 6–20)
CALCIUM: 9.4 mg/dL (ref 8.9–10.3)
CO2: 26 mmol/L (ref 22–32)
Chloride: 102 mmol/L (ref 101–111)
Creatinine, Ser: 1.1 mg/dL (ref 0.61–1.24)
GFR calc Af Amer: >60 mL/min (ref >60)
GLUCOSE: 91 mg/dL (ref 65–99)
Potassium: 4.3 mmol/L (ref 3.5–5.1)
Sodium: 138 mmol/L (ref 135–145)

## 2017-02-19 LAB — SEROTONIN RELEASE ASSAY (SRA): SRA, LOW DOSE HEPARIN: 1 % (ref 0–20)

## 2017-02-19 MED ORDER — ASPIRIN 325 MG PO TBEC: 325.0000 mg | DELAYED_RELEASE_TABLET | Freq: Every day | ORAL | 0 refills | Status: DC

## 2017-02-19 MED ORDER — CARVEDILOL 3.125 MG PO TABS: 3.1250 mg | ORAL_TABLET | Freq: Two times a day (BID) | ORAL | Status: DC

## 2017-02-19 MED ORDER — SPIRONOLACTONE 25 MG PO TABS: 25.0000 mg | ORAL_TABLET | Freq: Every day | ORAL | Status: AC

## 2017-02-19 MED ORDER — TRAMADOL HCL 50 MG PO TABS: 50.0000 mg | ORAL_TABLET | ORAL | 0 refills | Status: DC | PRN

## 2017-02-19 MED ORDER — SPIRONOLACTONE 25 MG PO TABS
25.0000 mg | ORAL_TABLET | Freq: Every day | ORAL | Status: DC
  Administered 2017-02-19: 25 mg via ORAL
  Filled 2017-02-19: qty 1

## 2017-02-19 MED ORDER — FUROSEMIDE 40 MG PO TABS: 40.0000 mg | ORAL_TABLET | Freq: Every day | ORAL | Status: AC

## 2017-02-19 MED ORDER — LOSARTAN POTASSIUM 25 MG PO TABS: 25.0000 mg | ORAL_TABLET | Freq: Every day | ORAL | Status: DC

## 2017-02-19 NOTE — Care Management Note (Signed)
Case Management Note Marvetta Gibbons RN, BSN Unit 2W-Case Manager [phone]  Patient Details  Name: Kolden Dupee MRN: [id number] Date of Birth: 1946/01/12  Subjective/Objective:   Pt admitted with NSTEMI, s/p CABG- tx from [facility] to 2W on 02/16/17                 Action/Plan: PTA pt lived at home- PT eval recommending STSNF- CSW consulted for placement needs.   Expected Discharge Date:  02/19/17               Expected Discharge Plan:  Skilled Nursing Facility  In-House Referral:  Clinical Social Work  Discharge planning Services  CM Consult  Post Acute Care Choice:  NA Choice offered to:  NA  DME Arranged:    DME Agency:     HH Arranged:    [place] Agency:     Status of Service:  Completed, signed off  If discussed at [employer], dates discussed:    Discharge Disposition: skilled facility   Additional Comments:  02/19/17- 1030- Laylia Mui RN, CM- pt for d/c to SNF today- CSW following for placement needs- plan for [facility].   Dawayne Patricia, RN 02/19/2017, 10:36 AM

## 2017-02-19 NOTE — Progress Notes (Signed)
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 Platelets up to 197   Overall feels good. Denies SOB.   Objective:   Weight Range:  Vital Signs:   Temp:  [98 F (36.7 C)-98.3 F (36.8 C)] 98 F (36.7 C) (04/13 0445) Pulse Rate:  [75-88] 75 (04/13 0445) Resp:  [18] 18 (04/13 0445) BP: (108-125)/(47-68) 115/61 (04/13 0445) SpO2:  [96 %-100 %] 100 % (04/13 0445) Weight:  [179 lb 9.6 oz (81.5 kg)] 179 lb 9.6 oz (81.5 kg) (04/13 0445) Last BM Date: 02/18/17  Weight change: Filed Weights   02/17/17 0527 02/18/17 0346 02/19/17 0445  Weight: 181 lb 3.2 oz (82.2 kg) 180 lb 8 oz (81.9 kg) 179 lb 9.6 oz (81.5 kg)    Intake/Output:   Intake/Output Summary (Last 24 hours) at 02/19/17 0755 Last data filed at 02/19/17 0300  Gross per 24 hour  Intake                0 ml  Output              150 ml  Net             -150 ml     Physical Exam: General:  Well appearing. No resp difficulty. Sitting on the side of the bed.  HEENT: normal Neck: supple. no JVD. Carotids 2+ bilat; no bruits. No lymphadenopathy or thryomegaly appreciated. Cor: PMI nondisplaced. Regular rate & rhythm. No rubs, gallops or murmurs. Lungs: clear Abdomen: soft, nontender, nondistended. No hepatosplenomegaly. No bruits or masses. Good bowel sounds. Extremities: no cyanosis, clubbing, rash, edema Neuro: alert & orientedx3, cranial nerves grossly intact. moves all 4 extremities w/o difficulty. Affect pleasant Skin: sternal, RLE, LLE incision approximated.    Telemetry: NSR 70-80s with PVCs. Personally reviewed.   Labs: Basic Metabolic Panel:  Recent Labs Lab 02/15/17 0400 02/16/17 0326 02/17/17 0338 02/18/17 0401 02/19/17 0405  NA 140 141 139 141 138  K 3.4* 4.0 4.0 4.3 4.3  CL 101 105 104 105 102  CO2 27 27 25 27 26   GLUCOSE 97 106* 99 96 91  BUN 16 11 13 19  24*  CREATININE 0.97 0.81 0.95 0.99 1.10  CALCIUM 9.0 9.3 9.2  9.5 9.4  MG  --  2.0  --   --   --     Liver Function Tests: No results for input(s): AST, ALT, ALKPHOS, BILITOT, PROT, ALBUMIN in the last 168 hours. No results for input(s): LIPASE, AMYLASE in the last 168 hours. No results for input(s): AMMONIA in the last 168 hours.  CBC:  Recent Labs Lab 02/15/17 0400 02/16/17 0326 02/17/17 0338 02/18/17 0401 02/19/17 0405  WBC 6.5 6.6 6.3 7.0 7.7  NEUTROABS  --  4.5  --  4.6 4.5  HGB 7.8* 8.1* 8.3* 7.8* 8.4*  HCT 23.7* 25.1* 25.3* 24.7* 26.7*  MCV 96.0 96.2 96.6 98.0 96.7  PLT 151 160 197 263 352    Cardiac Enzymes: No results for input(s): CKTOTAL, CKMB, CKMBINDEX, TROPONINI in the last 168 hours.  BNP: BNP (last 3 results) No results for input(s): BNP in the last 8760 hours.  ProBNP (last 3 results) No results for input(s): PROBNP in the last 8760 hours.    Other results:  Imaging: No results found.   Medications:     Scheduled Medications: . aspirin EC  325 mg Oral Daily  . atorvastatin  40  mg Oral q1800  . carvedilol  3.125 mg Oral BID WC  . folic acid  1 mg Oral Daily  . furosemide  40 mg Oral Daily  . losartan  25 mg Oral Daily  . pantoprazole  40 mg Oral QAC breakfast  . potassium chloride  20 mEq Oral Daily  . sodium chloride flush  3 mL Intravenous Q12H  . spironolactone  12.5 mg Oral Daily    Infusions:   PRN Medications: sodium chloride, acetaminophen, bisacodyl **OR** bisacodyl, ondansetron **OR** ondansetron (ZOFRAN) IV, sodium chloride flush, sodium chloride flush, traMADol   Assessment/Plan/Discussion    1. CAD- POD S/P CABG x3. On 4/5 - Suspect EF will recover with revascularization as troponin didn't bump much and no major scar at time of CABG.  2. Acute Systolic Heart Failure- ICM. ECHO EF ~25%.  -Volume status stable. Continue lasix 40 mg daily.     - Continue coreg 3.125 mg BID - Increase spiro to 25 mg daily.   - Continue losartan 25 mg daily.  - Renal function stable.     3.  HLD - Continue statin and ASA 4. Anemia - Hgb up to 8.4 . No obvious source.  5. Thrombocytopenia - Hit AB weakly + but SRA negative so doubt HIT.  - Platelets up to 352  - No bleeding.  ? If true HIT. SRA pending.   6. Hypokalemia K 4.3    7. R pleural effusion  - CXR today with decreased effusion noted.    D/C cardiac meds Lasix 40 mg daily  Carvedilol 3.125 mg twice a day.  Losartan 25 mg daily Spironolactone 25 mg daily ASA Atorvastatin 40  Follow up in the HF follow up. April 19th at 9:30. Plan to check BMET at that time.   Length of Stay: [place], NP  02/19/2017, 7:55 AM  Advanced Heart Failure Team Pager [phone] (M-F; 7a - 4p)  Please contact [facility] for night-coverage after hours (4p -7a ) and weekends on [email or web address]  Patient seen with NP, agree with the above note.  Stable today, possible discharge to SNF.  He should get the above cardiac meds.   HIT Ab weakly positive but SRA negative, doubt HIT.   Loralie Champagne 02/19/2017 8:14 AM

## 2017-02-19 NOTE — Progress Notes (Signed)
Discharged to home with family office visits in place teaching done  

## 2017-02-19 NOTE — Progress Notes (Signed)
CARDIAC REHAB PHASE I   PRE:  Rate/Rhythm: 77 SR  BP:  Sitting: 103/66        SaO2: 96 RA  MODE:  Ambulation: 250 ft   POST:  Rate/Rhythm: 103 ST  BP:  Sitting: 135/63         SaO2: 96 RA  Pt ambulated 250 ft on RA, rolling walker, assist x1, slow, steady gait, tolerated well with no complaints. Cardiac surgery discharge education completed with pt and daughters at bedside. Reviewed risk factors, IS, sternal precautions, activity progression, exercise, heart healthy diet, sodium restrictions, CHF booklet and zone tool, daily weights and phase 2 cardiac rehab. Pt verbalized understanding, receptive to education. Pt agrees to phase 2 cardiac rehab referral, will send to [facility] per pt request. Pt to recliner after walk, call bell within reach.  [phone] Lenna Sciara, RN, BSN 02/19/2017 12:09 PM

## 2017-02-19 NOTE — Progress Notes (Addendum)
Clinical Social Worker facilitated patient discharge including contacting patient family and facility to confirm patient discharge plans.  Clinical information faxed to facility and family agreeable with plan. Patients daughter stated she will transport patient to [facility] .  RN to call [phone] (pt will be place in room 511) report prior to discharge.  Clinical Social Worker will sign off for now as social work intervention is no longer needed. Please consult [place] again if new need arises.  Rhea Pink, MSW, [place]

## 2017-02-19 NOTE — Progress Notes (Addendum)
      [address]       [place] [address]             [phone]      9 Days Post-Op Procedure(s) (LRB): CORONARY ARTERY BYPASS GRAFTING (CABG), ON PUMP, TIMES THREE, USING LEFT INTERNAL MAMMARY ARTERY AND ENDOSCOPICALLY HARVESTED BILATERAL GREATER SAPHENOUS VEINS (N/A) TRANSESOPHAGEAL ECHOCARDIOGRAM (TEE) (N/A)   Subjective:  Feels great.  No complaints  + ambulation   + BM  Objective: Vital signs in last 24 hours: Temp:  [98 F (36.7 C)-98.3 F (36.8 C)] 98 F (36.7 C) (04/13 0445) Pulse Rate:  [75-88] 75 (04/13 0445) Cardiac Rhythm: Normal sinus rhythm;Bundle branch block (04/12 1900) Resp:  [18] 18 (04/13 0445) BP: (108-125)/(47-68) 115/61 (04/13 0445) SpO2:  [96 %-100 %] 100 % (04/13 0445) Weight:  [179 lb 9.6 oz (81.5 kg)] 179 lb 9.6 oz (81.5 kg) (04/13 0445)  Intake/Output from previous day: 04/12 0701 - 04/13 0700 In: 0  Out: 450 [Urine:450]  General appearance: alert, cooperative and no distress Heart: regular rate and rhythm Lungs: clear to auscultation bilaterally Abdomen: soft, non-tender; bowel sounds normal; no masses,  no organomegaly Extremities: edema trace, R>L Wound: clean and dry  Lab Results:  Recent Labs  02/18/17 0401 02/19/17 0405  WBC 7.0 7.7  HGB 7.8* 8.4*  HCT 24.7* 26.7*  PLT 263 352   BMET:  Recent Labs  02/18/17 0401 02/19/17 0405  NA 141 138  K 4.3 4.3  CL 105 102  CO2 27 26  GLUCOSE 96 91  BUN 19 24*  CREATININE 0.99 1.10  CALCIUM 9.5 9.4    PT/INR: No results for input(s): LABPROT, INR in the last 72 hours. ABG    Component Value Date/Time   PHART 7.372 02/11/2017 0007   HCO3 21.6 02/11/2017 0007   TCO2 24 02/11/2017 1754   ACIDBASEDEF 3.0 (H) 02/11/2017 0007   O2SAT 56.8 02/12/2017 0823   CBG (last 3)   Recent Labs  02/16/17 2053 02/17/17 0631 02/17/17 1103  GLUCAP 90 90 150*    Assessment/Plan: S/P Procedure(s) (LRB): CORONARY ARTERY BYPASS GRAFTING (CABG), ON PUMP, TIMES  THREE, USING LEFT INTERNAL MAMMARY ARTERY AND ENDOSCOPICALLY HARVESTED BILATERAL GREATER SAPHENOUS VEINS (N/A) TRANSESOPHAGEAL ECHOCARDIOGRAM (TEE) (N/A)  1. CV- Acute Systolic HF, AHF managing- cnotinue Coreg and Losartan 2. Pulm- no acute issues, continue IS 3. Renal- creatinine 1.10, weight is trending down continue Lasix, Spiro 4. Expected blood loss anemia- Hgb at 8.4, no obvious source of bleeding, encourage multivitamin with iron 5. Deconditioning- SNF placement arranged 6. Dispo- patient stable, will d/c to SNF today   LOS: 10 days    Byrd, Seth 02/19/2017 I have seen and examined Seth Byrd and agree with the above assessment  and plan.  [name] MD [place] [phone] Office [phone] 02/19/2017 2:34 PM

## 2017-02-23 ENCOUNTER — Telehealth (HOSPITAL_COMMUNITY): Payer: Self-pay | Admitting: Physician Assistant

## 2017-02-23 DIAGNOSIS — M069 Rheumatoid arthritis, unspecified: Secondary | ICD-10-CM | POA: Insufficient documentation

## 2017-02-23 NOTE — Telephone Encounter (Signed)
      [address]       [place] [address]             [phone]    [place] [phone]   S/P CABG x 3 performed on 4/4.  Discharged to a SNF on 4/13. Attempted to call both numbers listed in Epic this morning. Appointment made with Dr. Bethanne Ginger office. I will try to call at another time.   Medications: Current Outpatient Prescriptions on File Prior to Visit  Medication Sig Dispense Refill  . acetaminophen (TYLENOL) 500 MG tablet Take 500 mg by mouth every 6 (six) hours as needed.    Marland Kitchen aspirin EC 325 MG EC tablet Take 1 tablet (325 mg total) by mouth daily. 30 tablet 0  . atorvastatin (LIPITOR) 40 MG tablet Take 1 tablet (40 mg total) by mouth daily at 6 PM.    . carvedilol (COREG) 3.125 MG tablet Take 1 tablet (3.125 mg total) by mouth 2 (two) times daily with a meal.    . etanercept (ENBREL) 50 MG/ML injection Inject 50 mg into the skin once a week. Saturday    . furosemide (LASIX) 40 MG tablet Take 1 tablet (40 mg total) by mouth daily. 30 tablet   . losartan (COZAAR) 25 MG tablet Take 1 tablet (25 mg total) by mouth daily.    . Multiple Vitamins-Minerals (ONE-A-DAY MENS 50+ ADVANTAGE PO) Take 1 tablet by mouth daily.    . Omega-3 Fatty Acids (FISH OIL) 1200 MG CAPS Take 1 capsule by mouth 3 (three) times daily.    Marland Kitchen spironolactone (ALDACTONE) 25 MG tablet Take 1 tablet (25 mg total) by mouth daily.    . traMADol (ULTRAM) 50 MG tablet Take 1-2 tablets (50-100 mg total) by mouth every 4 (four) hours as needed for moderate pain. 30 tablet 0  . vitamin B-12 (CYANOCOBALAMIN) 1000 MCG tablet Take 5,000 mcg by mouth daily.     No current facility-administered medications on file prior to visit.     Coumadin:  INR check Yes/No   Follow up Appointment:   Dr. Ubaldo Glassing on May 8th 2018 at 9:45am.   [name], MD Follow up.   Specialty:  Cardiothoracic Surgery Why:  Your appointment is on May 10th at 1:30pm. Please arrive at 1:00pm for your chest xray which  is located on the first floor of our building.  Contact information: [address] [place] [address] [phone]     Nicholes Rough, PA-C

## 2017-02-24 ENCOUNTER — Telehealth (HOSPITAL_COMMUNITY): Payer: Self-pay | Admitting: Physician Assistant

## 2017-02-24 NOTE — Telephone Encounter (Signed)
      [address]       [place] [address]             [phone]    Seth Byrd [id number]   S/P CABG x 3 performed on 4/4.  Discharged to a SNF on 4/13. Attempted to call both numbers listed in Epic this morning. Appointment made with Dr. Bethanne Ginger office and shared with the patient.    Medications:  Current Outpatient Prescriptions on File Prior to Visit  Medication Sig Dispense Refill  . acetaminophen (TYLENOL) 500 MG tablet Take 500 mg by mouth every 6 (six) hours as needed.    Marland Kitchen aspirin EC 325 MG EC tablet Take 1 tablet (325 mg total) by mouth daily. 30 tablet 0  . atorvastatin (LIPITOR) 40 MG tablet Take 1 tablet (40 mg total) by mouth daily at 6 PM.    . carvedilol (COREG) 3.125 MG tablet Take 1 tablet (3.125 mg total) by mouth 2 (two) times daily with a meal.    . etanercept (ENBREL) 50 MG/ML injection Inject 50 mg into the skin once a week. Saturday    . furosemide (LASIX) 40 MG tablet Take 1 tablet (40 mg total) by mouth daily. 30 tablet   . losartan (COZAAR) 25 MG tablet Take 1 tablet (25 mg total) by mouth daily.    . Multiple Vitamins-Minerals (ONE-A-DAY MENS 50+ ADVANTAGE PO) Take 1 tablet by mouth daily.    . Omega-3 Fatty Acids (FISH OIL) 1200 MG CAPS Take 1 capsule by mouth 3 (three) times daily.    Marland Kitchen spironolactone (ALDACTONE) 25 MG tablet Take 1 tablet (25 mg total) by mouth daily.    . traMADol (ULTRAM) 50 MG tablet Take 1-2 tablets (50-100 mg total) by mouth every 4 (four) hours as needed for moderate pain. 30 tablet 0  . vitamin B-12 (CYANOCOBALAMIN) 1000 MCG tablet Take 5,000 mcg by mouth daily.     No current facility-administered medications on file prior to visit.     Coumadin:  INR check Yes/No  Problems/Concerns: I spoke with Seth Byrd today. He is doing well at rehab and is scheduled to leave rehab on Friday. He has an appointment tomorrow with Seth Grinder, NP. He also has an appointment with [place] on May 10th. I reviewed the  appointment I made with Seth Byrd in [place] on May 8th. He understood all his appointment and didn't have any further questions. He did mention that his daughter might call our office to confirm. I shared our office number with him again. He is excited to be discharged home at the end of the week. The facility is arranging some home care for him.   Assessment:  Patient is doing well. Contact office if concerns or problems develop  Follow up Appointment:   Seth Isaac, MD Follow up.   Specialty:  Cardiothoracic Surgery Why:  Your appointment is on May 10th at 1:30pm. Please arrive at 1:00pm for your chest xray which is located on the first floor of our building.  Contact information: [address] [place] [address] [phone]            Seth Grinder, NP Follow up on 02/25/2017.   Specialty:  Cardiology Why:  9;30 Garage Code 6000  Contact information: [address] [place] [address] [phone]     Seth Byrd on May 8th 2018 at 9:45am.    Seth Rough, PA-C

## 2017-02-25 ENCOUNTER — Ambulatory Visit (HOSPITAL_COMMUNITY)
Admit: 2017-02-25 | Discharge: 2017-02-25 | Disposition: A | Discharge status: Home / Self Care | Payer: 59 | Attending: Internal Medicine | Admitting: Internal Medicine

## 2017-02-25 VITALS — BP 112/60 | HR 88 | Wt 179.0 lb

## 2017-02-25 DIAGNOSIS — Z7982 Long term (current) use of aspirin: Secondary | ICD-10-CM | POA: Insufficient documentation

## 2017-02-25 DIAGNOSIS — D649 Anemia, unspecified: Secondary | ICD-10-CM | POA: Diagnosis not present

## 2017-02-25 DIAGNOSIS — Z951 Presence of aortocoronary bypass graft: Secondary | ICD-10-CM | POA: Diagnosis not present

## 2017-02-25 DIAGNOSIS — Z87891 Personal history of nicotine dependence: Secondary | ICD-10-CM | POA: Diagnosis not present

## 2017-02-25 DIAGNOSIS — Z8546 Personal history of malignant neoplasm of prostate: Secondary | ICD-10-CM | POA: Insufficient documentation

## 2017-02-25 DIAGNOSIS — I5022 Chronic systolic (congestive) heart failure: Secondary | ICD-10-CM | POA: Diagnosis not present

## 2017-02-25 DIAGNOSIS — E785 Hyperlipidemia, unspecified: Secondary | ICD-10-CM | POA: Insufficient documentation

## 2017-02-25 DIAGNOSIS — D696 Thrombocytopenia, unspecified: Secondary | ICD-10-CM | POA: Insufficient documentation

## 2017-02-25 DIAGNOSIS — Z823 Family history of stroke: Secondary | ICD-10-CM | POA: Insufficient documentation

## 2017-02-25 DIAGNOSIS — I255 Ischemic cardiomyopathy: Secondary | ICD-10-CM

## 2017-02-25 DIAGNOSIS — I10 Essential (primary) hypertension: Secondary | ICD-10-CM

## 2017-02-25 DIAGNOSIS — J449 Chronic obstructive pulmonary disease, unspecified: Secondary | ICD-10-CM | POA: Diagnosis not present

## 2017-02-25 DIAGNOSIS — I110 Hypertensive heart disease with heart failure: Secondary | ICD-10-CM | POA: Diagnosis not present

## 2017-02-25 DIAGNOSIS — M1990 Unspecified osteoarthritis, unspecified site: Secondary | ICD-10-CM | POA: Insufficient documentation

## 2017-02-25 DIAGNOSIS — I2510 Atherosclerotic heart disease of native coronary artery without angina pectoris: Secondary | ICD-10-CM

## 2017-02-25 LAB — CBC
HEMATOCRIT: 27.5 % — AB (ref 39.0–52.0)
HEMOGLOBIN: 8.8 g/dL — AB (ref 13.0–17.0)
MCH: 30.7 pg (ref 26.0–34.0)
MCHC: 32 g/dL (ref 30.0–36.0)
MCV: 95.8 fL (ref 78.0–100.0)
Platelets: 473 10*3/uL — ABNORMAL HIGH (ref 150–400)
RBC: 2.87 MIL/uL — ABNORMAL LOW (ref 4.22–5.81)
RDW: 12.5 % (ref 11.5–15.5)
WBC: 6.6 10*3/uL (ref 4.0–10.5)

## 2017-02-25 LAB — BASIC METABOLIC PANEL
ANION GAP: 10 (ref 5–15)
BUN: 15 mg/dL (ref 6–20)
CHLORIDE: 104 mmol/L (ref 101–111)
CO2: 23 mmol/L (ref 22–32)
Calcium: 9.8 mg/dL (ref 8.9–10.3)
Creatinine, Ser: 0.92 mg/dL (ref 0.61–1.24)
GFR calc Af Amer: >60 mL/min (ref >60)
GLUCOSE: 90 mg/dL (ref 65–99)
POTASSIUM: 4.1 mmol/L (ref 3.5–5.1)
Sodium: 137 mmol/L (ref 135–145)

## 2017-02-25 MED ORDER — CARVEDILOL 3.125 MG PO TABS: 3.1250 mg | ORAL_TABLET | Freq: Two times a day (BID) | ORAL | Status: DC

## 2017-02-25 NOTE — Progress Notes (Signed)
Advanced Heart Failure Clinic Note   Primary Cardiologist: Dr. Haroldine Laws   HPI:  Seth Byrd is a 71 y.o. AA male with history of chronic systolic CHF due to ischemic cardiomyopathy, Echo 02/09/17 LVEF 20-25%, OA, HTN, HLD, h/o prostate cancer, and COPD.   Admitted presented to [facility] 02/09/17 with chest discomfort, diaphoresis, and SOB. Workup in the ED showed no ST elevation but + cardiac enzymes and ruled in for NSTEMI. Cath and Echo 02/09/17 as below. CVTS consulted and pt sent to [facility] for CABG consideration.  HF team consulted for optimization. Revascularization decided as priority and pt underwent CABG x 3 02/10/17.  Pt presents today for post hospital follow up. Currently staying in SNF at [facility] in [place]. Finished rehab and is going home tomorrow.  Feels like he is back up to about 60-65% of his normal.  Denies any SOB walking up steps or with PT.  Still having some peripheral edema. Still sore at his sternal wound. Denies fever, chills, drainage from wounds, or N/V, or diarrhea. No CP or heaviness. No orthopnea or PND. Denies lightheadedness or dizziness. Taking all medications as directed, although Coreg is missing from his list.   02/09/17 Echo LVEF 20-25%, Mild AI, Mild/ModMR, RV mildly dilated and mildly reduced  LHC 02/09/17  -Ost LM lesion, 90 %stenosed. -Prox Cx lesion, 95 %stenosed. Colon Flattery LAD lesion, 95 %stenosed. -Prox RCA lesion, 55 %stenosed. -Mid RCA lesion, 40 %stenosed. -Dist RCA lesion, 35 %stenosed. -There is severe left ventricular systolic dysfunction. -The left ventricular ejection fraction is 25-35% by visual estimate.   Left main 90%, 95% proximal lcx, 95% proximal lad, insignificant disease in rca. Moderate to severely reduced lv funciton with ef 25-25%  TEE 02/10/17 (interoperative) Severe LV dilation, EF 20-25% Trileaflet AV with trace regurg Mitral with Mod/Severe regurg RV with mild/mod dilation and mildly reduced function TV with mod TR jet  directed toward septum PV with mod PR  02/10/17 Spirometry FVC 2.72 (68%) FEV1 1.94 (64%) FEV1/FVC 70%   Review of systems complete and found to be negative unless listed in HPI.    Past Medical History:  Diagnosis Date  . Arthritis   . Hypertension     Current Outpatient Prescriptions  Medication Sig Dispense Refill  . acetaminophen (TYLENOL) 500 MG tablet Take 500 mg by mouth every 6 (six) hours as needed.    Marland Kitchen aspirin EC 325 MG EC tablet Take 1 tablet (325 mg total) by mouth daily. 30 tablet 0  . atorvastatin (LIPITOR) 40 MG tablet Take 1 tablet (40 mg total) by mouth daily at 6 PM.    . etanercept (ENBREL) 50 MG/ML injection Inject 50 mg into the skin once a week. Saturday    . furosemide (LASIX) 40 MG tablet Take 1 tablet (40 mg total) by mouth daily. 30 tablet   . losartan (COZAAR) 25 MG tablet Take 1 tablet (25 mg total) by mouth daily.    . Multiple Vitamins-Minerals (ONE-A-DAY MENS 50+ ADVANTAGE PO) Take 1 tablet by mouth daily.    . Omega-3 Fatty Acids (FISH OIL) 1200 MG CAPS Take 1 capsule by mouth 3 (three) times daily.    Marland Kitchen spironolactone (ALDACTONE) 25 MG tablet Take 1 tablet (25 mg total) by mouth daily.    . traMADol (ULTRAM) 50 MG tablet Take 1-2 tablets (50-100 mg total) by mouth every 4 (four) hours as needed for moderate pain. 30 tablet 0  . vitamin B-12 (CYANOCOBALAMIN) 1000 MCG tablet Take 1,000 mcg by mouth daily.  No current facility-administered medications for this encounter.    No Known Allergies   Social History   Social History  . Marital status: Married    Spouse name: N/A  . Number of children: N/A  . Years of education: N/A   Occupational History  . Not on file.   Social History Main Topics  . Smoking status: Former Smoker    Packs/day: 1.50    Types: Cigarettes    Quit date: 04/09/2005  . Smokeless tobacco: Never Used  . Alcohol use No  . Drug use: No  . Sexual activity: Not on file   Other Topics Concern  . Not on file    Social History Narrative  . No narrative on file      Family History  Problem Relation Age of Onset  . Hypertension Mother   . Transient ischemic attack Mother   . CVA Mother   . Colon cancer Father   . Hypertension Father   . Prostate cancer Father   . Diabetes Brother     Vitals:   02/25/17 0934  BP: 112/60  Pulse: 88  SpO2: 96%  Weight: 179 lb (81.2 kg)   Wt Readings from Last 3 Encounters:  02/25/17 179 lb (81.2 kg)  02/19/17 179 lb 9.6 oz (81.5 kg)  02/09/17 187 lb (84.8 kg)   PHYSICAL EXAM: General:  Well appearing elder AA male in NAD. HEENT: Normal Neck: Supple. no JVD. Carotids 2+ bilat; no bruits. No lymphadenopathy or thyromegaly appreciated. Cor: PMI nondisplaced. Regular rate & rhythm. No rubs, gallops or murmurs. Sternal incision C/D/I. No swelling or drainage. Lungs: Clear Abdomen: Soft, nontender, nondistended. No hepatosplenomegaly. No bruits or masses. Good bowel sounds. Extremities: No cyanosis, clubbing, or rash. Trace ankle edema. Bilateral graft sites stable.  Neuro: Alert & oriented x 3, cranial nerves grossly intact. moves all 4 extremities w/o difficulty. Affect pleasant.  ECG today reviewed personally. Sinus rhythm 88 bpm with PAC    ASSESSMENT & PLAN:  1. Chronic systolic CHF due to ICM - 02/09/17 Echo LVEF 20-25%, Mild AI, Mild/ModMR, RV mildly dilated and mildly reduced - NYHA II-III symptoms. Improving with PT.  - Volume status stable on exam.  Trace ankle edema.  - Continue lasix 40 mg daily - Start on coreg 3.125 mg BID - Continue losartan 25 mg daily. BMET today.  - Continue spironolactone 25 mg daily - Reinforced fluid restriction to < 2 L daily, sodium restriction to less than 2000 mg daily, and the importance of daily weights.   - Will plan on repeat echo in 3-4 months for possible ICD consideration. Though suspect he will see improvement in EF.  2. CAD s/p CABG x 3 - Continue ASA 81 mg and Atorvastatin 40 mg daily.  3.  HLD - Continue statin and ASA 4. Anemia - Had improved prior to d/c. CBC today.  5. Thrombocytopenia - CBC today.  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  BMET today. Add coreg. Follow up 3 weeks with Pharm-D and 6 weeks with MD. Will eventually plan repeat Echo to make sure patient wont need ICD  Shirley Friar, PA-C 02/25/17   Greater than 50% of the 25 minute visit was spent in counseling/coordination of care regarding disease state education, medication reconciliation, medication/dietary compliance, and discussion of medical regimen with on-site Pharm-D.

## 2017-02-25 NOTE — Progress Notes (Signed)
Advanced Heart Failure Medication Review by a Pharmacist  Does the patient  feel that his/her medications are working for him/her?  yes  Has the patient been experiencing any side effects to the medications prescribed?  no  Does the patient measure his/her own blood pressure or blood glucose at home?  yes   Does the patient have any problems obtaining medications due to transportation or finances?   no  Understanding of regimen: fair Understanding of indications: fair Potential of compliance: excellent Patient understands to avoid NSAIDs. Patient understands to avoid decongestants.  Issues to address at subsequent visits: None   Pharmacist comments:  Seth Byrd is a pleasant 71 yo M presenting with his son from [facility]. Updated medication list based on current SNF list. Carvedilol not listed so taken off of our list. No other discrepancies noted.   [name], PharmD, BCPS, CPP Clinical Pharmacist Pager: [phone] Phone: [phone] 02/25/2017 9:51 AM      Time with patient: 2 minutes Preparation and documentation time: 10 minutes Total time: 12 minutes

## 2017-02-25 NOTE — Patient Instructions (Signed)
Routine lab work today. Will notify you of abnormal results, otherwise no news is good news!  START Carvedilol (Coreg) 3.125 mg tablet twice daily.  Follow up in 3 weeks with Ileene Patrick CHF pharmacist.  Follow up 6 months with Dr. Haroldine Laws.  Do the following things EVERYDAY: 1) Weigh yourself in the morning before breakfast. Write it down and keep it in a log. 2) Take your medicines as prescribed 3) Eat low salt foods-Limit salt (sodium) to 2000 mg per day.  4) Stay as active as you can everyday 5) Limit all fluids for the day to less than 2 liters

## 2017-03-11 ENCOUNTER — Ambulatory Visit: Payer: 59 | Admitting: Family Medicine

## 2017-03-15 ENCOUNTER — Other Ambulatory Visit: Payer: Self-pay | Admitting: Cardiothoracic Surgery

## 2017-03-15 DIAGNOSIS — Z951 Presence of aortocoronary bypass graft: Secondary | ICD-10-CM

## 2017-03-16 ENCOUNTER — Encounter: Payer: Self-pay | Admitting: Family Medicine

## 2017-03-16 ENCOUNTER — Ambulatory Visit (INDEPENDENT_AMBULATORY_CARE_PROVIDER_SITE_OTHER): Payer: 59 | Admitting: Family Medicine

## 2017-03-16 VITALS — BP 114/73 | HR 99 | Temp 98.1°F | Resp 16 | Ht 71.0 in | Wt 174.9 lb

## 2017-03-16 DIAGNOSIS — Z951 Presence of aortocoronary bypass graft: Secondary | ICD-10-CM

## 2017-03-16 DIAGNOSIS — I2510 Atherosclerotic heart disease of native coronary artery without angina pectoris: Secondary | ICD-10-CM

## 2017-03-16 DIAGNOSIS — I5022 Chronic systolic (congestive) heart failure: Secondary | ICD-10-CM | POA: Diagnosis not present

## 2017-03-16 DIAGNOSIS — I214 Non-ST elevation (NSTEMI) myocardial infarction: Secondary | ICD-10-CM | POA: Diagnosis not present

## 2017-03-16 NOTE — Progress Notes (Signed)
Name: Seth Byrd   MRN: [id number]    DOB: 1946-02-19   Date:03/16/2017       Progress Note  Subjective  Chief Complaint  Chief Complaint  Patient presents with  . Hospitalization Follow-up    Triple Bypass surgery    HPI  Patient presents for hospital and rehab follow up after undergoing CABG x3 for NSTEMI with severely reduced EF. Initial admission to [facility] for symptoms of chest discomfort, ruled in for NSTEMI, transferred to [facility] where he had CABG X3, discharged to [facility] rehab.  He feels well, on appropriate post MI and post CABG medications, followed by Cardiology with plans to start on Cardiac rehab.   Past Medical History:  Diagnosis Date  . Arthritis   . Hypertension     Past Surgical History:  Procedure Laterality Date  . BACK SURGERY     Spinal Fusion with 2 screws  . CORONARY ARTERY BYPASS GRAFT N/A 02/10/2017   Procedure: CORONARY ARTERY BYPASS GRAFTING (CABG), ON PUMP, TIMES THREE, USING LEFT INTERNAL MAMMARY ARTERY AND ENDOSCOPICALLY HARVESTED BILATERAL GREATER SAPHENOUS VEINS;  Surgeon: [name], MD;  Location: [facility];  Service: Open Heart Surgery;  Laterality: N/A;  LIMA to LAD SVG to Diag 1 SVG to OM1  . JOINT REPLACEMENT    . LEFT HEART CATH AND CORONARY ANGIOGRAPHY N/A 02/09/2017   Procedure: Left Heart Cath and Coronary Angiography;  Surgeon: [name], MD;  Location: [facility] CV LAB;  Service: Cardiovascular;  Laterality: N/A;  . TEE WITHOUT CARDIOVERSION N/A 02/10/2017   Procedure: TRANSESOPHAGEAL ECHOCARDIOGRAM (TEE);  Surgeon: [name], MD;  Location: [facility];  Service: Open Heart Surgery;  Laterality: N/A;  . TOTAL KNEE ARTHROPLASTY Left 04/23/2015   Procedure: TOTAL KNEE ARTHROPLASTY;  Surgeon: Christophe Louis, MD;  Location: [facility];  Service: Orthopedics;  Laterality: Left;    Family History  Problem Relation Age of Onset  . Hypertension Mother   . Transient ischemic attack Mother   . CVA Mother   . Colon cancer  Father   . Hypertension Father   . Prostate cancer Father   . Diabetes Brother     Social History   Social History  . Marital status: Married    Spouse name: N/A  . Number of children: N/A  . Years of education: N/A   Occupational History  . Not on file.   Social History Main Topics  . Smoking status: Former Smoker    Packs/day: 1.50    Types: Cigarettes    Quit date: 04/09/2005  . Smokeless tobacco: Never Used  . Alcohol use No  . Drug use: No  . Sexual activity: Not on file   Other Topics Concern  . Not on file   Social History Narrative  . No narrative on file     Current Outpatient Prescriptions:  .  acetaminophen (TYLENOL) 500 MG tablet, Take 500 mg by mouth every 6 (six) hours as needed., Disp: , Rfl:  .  aspirin EC 325 MG EC tablet, Take 1 tablet (325 mg total) by mouth daily., Disp: 30 tablet, Rfl: 0 .  atorvastatin (LIPITOR) 40 MG tablet, Take 1 tablet (40 mg total) by mouth daily at 6 PM., Disp: , Rfl:  .  carvedilol (COREG) 3.125 MG tablet, Take 1 tablet (3.125 mg total) by mouth 2 (two) times daily with a meal., Disp: , Rfl:  .  etanercept (ENBREL) 50 MG/ML injection, Inject 50 mg into the skin once a week. Saturday,  Disp: , Rfl:  .  furosemide (LASIX) 40 MG tablet, Take 1 tablet (40 mg total) by mouth daily., Disp: 30 tablet, Rfl:  .  losartan (COZAAR) 25 MG tablet, Take 1 tablet (25 mg total) by mouth daily., Disp: , Rfl:  .  Multiple Vitamins-Minerals (ONE-A-DAY MENS 50+ ADVANTAGE PO), Take 1 tablet by mouth daily., Disp: , Rfl:  .  Omega-3 Fatty Acids (FISH OIL) 1200 MG CAPS, Take 1 capsule by mouth 3 (three) times daily., Disp: , Rfl:  .  spironolactone (ALDACTONE) 25 MG tablet, Take 1 tablet (25 mg total) by mouth daily., Disp: , Rfl:  .  traMADol (ULTRAM) 50 MG tablet, Take 1-2 tablets (50-100 mg total) by mouth every 4 (four) hours as needed for moderate pain., Disp: 30 tablet, Rfl: 0 .  vitamin B-12 (CYANOCOBALAMIN) 1000 MCG tablet, Take 1,000 mcg by  mouth daily. , Disp: , Rfl:   No Known Allergies   Review of Systems  Constitutional: Negative for chills and fever.  Respiratory: Positive for cough. Negative for sputum production and shortness of breath.   Cardiovascular: Positive for leg swelling. Negative for chest pain and palpitations.     Objective  Vitals:   03/16/17 1552  BP: 114/73  Pulse: 99  Resp: 16  Temp: 98.1 F (36.7 C)  TempSrc: Oral  SpO2: 96%  Weight: 174 lb 14.4 oz (79.3 kg)  Height: 5\' 11"  (1.803 m)    Physical Exam  Constitutional: He is oriented to person, place, and time and well-developed, well-nourished, and in no distress.  HENT:  Head: Normocephalic and atraumatic.  Cardiovascular: Normal rate, regular rhythm and normal heart sounds.   No murmur heard. Pulmonary/Chest: Effort normal and breath sounds normal. He has no wheezes.  Abdominal: Soft. Bowel sounds are normal. There is no tenderness.  Musculoskeletal: He exhibits no edema.  Neurological: He is alert and oriented to person, place, and time.  Psychiatric: Mood, memory, affect and judgment normal.  Nursing note and vitals reviewed.      Assessment & Plan  1. Coronary artery disease involving native coronary artery of native heart without angina pectoris , Reviewed hospital summary, on appropriate pharmacotherapy  2. NSTEMI (non-ST elevated myocardial infarction) ([place])   3. S/P CABG x 3 Reviewed operative note, on appropriate therapy  4. Chronic systolic CHF (congestive heart failure) (HCC) Minimal lower extremity edema, on furosemide   Shemaiah Round Asad A. [facility] 03/16/2017 4:04 PM

## 2017-03-18 ENCOUNTER — Ambulatory Visit
Admission: RE | Admit: 2017-03-18 | Discharge: 2017-03-18 | Disposition: A | Discharge status: Home / Self Care | Payer: 59 | Source: Ambulatory Visit | Attending: Cardiothoracic Surgery | Admitting: Cardiothoracic Surgery

## 2017-03-18 ENCOUNTER — Encounter: Payer: Self-pay | Admitting: Cardiothoracic Surgery

## 2017-03-18 ENCOUNTER — Ambulatory Visit (INDEPENDENT_AMBULATORY_CARE_PROVIDER_SITE_OTHER): Payer: Self-pay | Admitting: Cardiothoracic Surgery

## 2017-03-18 VITALS — BP 97/67 | HR 84 | Resp 16 | Ht 71.0 in | Wt 174.0 lb

## 2017-03-18 DIAGNOSIS — Z951 Presence of aortocoronary bypass graft: Secondary | ICD-10-CM

## 2017-03-18 DIAGNOSIS — I2510 Atherosclerotic heart disease of native coronary artery without angina pectoris: Secondary | ICD-10-CM

## 2017-03-18 NOTE — Progress Notes (Signed)
[address]       [place] [address]             [phone]      Seth Byrd Stephen Medical Record #[id number] Date of Birth: Mar 30, 1946  Referring: [name], MD Primary Care: Roselee Nova, MD  Chief Complaint:   POST OP FOLLOW UP 02/10/2017 OPERATIVE REPORT PREOPERATIVE DIAGNOSES:  Severe ischemic cardiomyopathy with left main and severe 2 vessel coronary artery disease, moderate mitral regurgitation. POSTOPERATIVE DIAGNOSES:  Severe ischemic cardiomyopathy with left main and severe 2 vessel coronary artery disease, moderate mitral regurgitation. SURGICAL PROCEDURE:  Coronary artery bypass grafting x3 with the left internal mammary to the left anterior descending coronary artery, reversed saphenous vein graft to the diagonal coronary artery, reversed saphenous vein graft to the circumflex coronary artery with bilateral thigh endo vein harvesting of the greater saphenous vein and opened left lower leg greater saphenous vein harvesting.Placement of right femorl aline with sonosite guidance  SURGEON:  Lanelle Bal, MD.  History of Present Illness:     Patient returns today after coronary artery bypass grafting done emergently on April 4, in patient with severe ischemic cardiomyopathy. Preoperatively the patient was noted to have a 20/25 percent injection fraction. He progress postoperatively relatively well. He was discharged to rehabilitation and is now at home. He denies any signs or symptoms of congestive heart failure. He's increasing his physical activity appropriately.     Past Medical History:  Diagnosis Date  . Arthritis   . Hypertension      History  Smoking Status  . Former Smoker  . Packs/day: 1.50  . Types: Cigarettes  . Quit date: 04/09/2005  Smokeless Tobacco  . Never Used    History  Alcohol Use No     No Known Allergies  Current Outpatient Prescriptions  Medication Sig Dispense Refill  .  acetaminophen (TYLENOL) 500 MG tablet Take 500 mg by mouth every 6 (six) hours as needed.    Marland Kitchen aspirin EC 325 MG EC tablet Take 1 tablet (325 mg total) by mouth daily. 30 tablet 0  . atorvastatin (LIPITOR) 40 MG tablet Take 1 tablet (40 mg total) by mouth daily at 6 PM.    . carvedilol (COREG) 3.125 MG tablet Take 1 tablet (3.125 mg total) by mouth 2 (two) times daily with a meal.    . etanercept (ENBREL) 50 MG/ML injection Inject 50 mg into the skin once a week. Saturday    . furosemide (LASIX) 40 MG tablet Take 1 tablet (40 mg total) by mouth daily. 30 tablet   . losartan (COZAAR) 25 MG tablet Take 1 tablet (25 mg total) by mouth daily.    . Multiple Vitamins-Minerals (ONE-A-DAY MENS 50+ ADVANTAGE PO) Take 1 tablet by mouth daily.    . Omega-3 Fatty Acids (FISH OIL) 1200 MG CAPS Take 1 capsule by mouth 3 (three) times daily.    Marland Kitchen spironolactone (ALDACTONE) 25 MG tablet Take 1 tablet (25 mg total) by mouth daily.    . traMADol (ULTRAM) 50 MG tablet Take 1-2 tablets (50-100 mg total) by mouth every 4 (four) hours as needed for moderate pain. 30 tablet 0  . vitamin B-12 (CYANOCOBALAMIN) 1000 MCG tablet Take 1,000 mcg by mouth daily.      No current facility-administered medications for this visit.        Physical Exam: BP 97/67 (BP Location: Right Arm, Patient Position: Sitting, Cuff Size: Large)   Pulse  84   Resp 16   Ht 5\' 11"  (1.803 m)   Wt 174 lb (78.9 kg)   SpO2 97% Comment: ON RA  BMI 24.27 kg/m   General appearance: alert and cooperative Neurologic: intact Heart: regular rate and rhythm, S1, S2 normal, no murmur, click, rub or gallop Lungs: clear to auscultation bilaterally Abdomen: soft, non-tender; bowel sounds normal; no masses,  no organomegaly Extremities: extremities normal, atraumatic, no cyanosis or edema and Homans sign is negative, no sign of DVT Wound: Sternum is stable and well healed, he has some eschar along with a left ankle a harvest site incision but without  evidence of infection   Diagnostic Studies & Laboratory data:     Recent Radiology Findings:   Dg Chest 2 View  Result Date: 03/18/2017 CLINICAL DATA:  History CABG 02/10/2017 EXAM: CHEST  2 VIEW COMPARISON:  02/17/2017 FINDINGS: Postop CABG. Heart size upper normal. Negative for heart failure. Lungs are now clear without infiltrate or effusion or mass. Improved aeration in the bases since the prior study. IMPRESSION: No active cardiopulmonary disease. Electronically Signed   By: Franchot Gallo M.D.   On: 03/18/2017 13:25      Recent Lab Findings: Lab Results  Component Value Date   WBC 6.6 02/25/2017   HGB 8.8 (L) 02/25/2017   HCT 27.5 (L) 02/25/2017   PLT 473 (H) 02/25/2017   GLUCOSE 90 02/25/2017   CHOL 139 05/29/2016   TRIG 85 05/29/2016   HDL 42 05/29/2016   LDLCALC 80 05/29/2016   ALT 27 02/09/2017   AST 37 02/09/2017   NA 137 02/25/2017   K 4.1 02/25/2017   CL 104 02/25/2017   CREATININE 0.92 02/25/2017   BUN 15 02/25/2017   CO2 23 02/25/2017   TSH 2.560 11/25/2015   INR 1.51 02/10/2017   HGBA1C 5.0 02/10/2017      Assessment / Plan:      Patient with significant ischemic cardiomyopathy, ejection fraction prior to surgery 20-25% by echo.  Patient currently without failure or ischemic symptoms He will need follow-up echocardiogram 3 months postop. He's to start in cardiac rehabilitation in the near future I told him to avoid lifting over 20-25 pounds for 3 months Plan to see him back in 2 months.     [name] MD      [address] [place] [address] Office [phone]   Beeper [phone]  03/18/2017 1:43 PM

## 2017-03-22 ENCOUNTER — Ambulatory Visit (HOSPITAL_COMMUNITY)
Admission: RE | Admit: 2017-03-22 | Discharge: 2017-03-22 | Disposition: A | Discharge status: Home / Self Care | Payer: 59 | Source: Ambulatory Visit | Attending: Cardiology | Admitting: Cardiology

## 2017-03-22 DIAGNOSIS — D649 Anemia, unspecified: Secondary | ICD-10-CM | POA: Diagnosis not present

## 2017-03-22 DIAGNOSIS — I2510 Atherosclerotic heart disease of native coronary artery without angina pectoris: Secondary | ICD-10-CM | POA: Diagnosis not present

## 2017-03-22 DIAGNOSIS — E785 Hyperlipidemia, unspecified: Secondary | ICD-10-CM | POA: Insufficient documentation

## 2017-03-22 DIAGNOSIS — M1990 Unspecified osteoarthritis, unspecified site: Secondary | ICD-10-CM | POA: Insufficient documentation

## 2017-03-22 DIAGNOSIS — J449 Chronic obstructive pulmonary disease, unspecified: Secondary | ICD-10-CM | POA: Diagnosis not present

## 2017-03-22 DIAGNOSIS — R61 Generalized hyperhidrosis: Secondary | ICD-10-CM | POA: Insufficient documentation

## 2017-03-22 DIAGNOSIS — Z8546 Personal history of malignant neoplasm of prostate: Secondary | ICD-10-CM | POA: Insufficient documentation

## 2017-03-22 DIAGNOSIS — I5022 Chronic systolic (congestive) heart failure: Secondary | ICD-10-CM | POA: Insufficient documentation

## 2017-03-22 DIAGNOSIS — I110 Hypertensive heart disease with heart failure: Secondary | ICD-10-CM | POA: Insufficient documentation

## 2017-03-22 DIAGNOSIS — Z79899 Other long term (current) drug therapy: Secondary | ICD-10-CM | POA: Insufficient documentation

## 2017-03-22 DIAGNOSIS — Z951 Presence of aortocoronary bypass graft: Secondary | ICD-10-CM | POA: Diagnosis not present

## 2017-03-22 DIAGNOSIS — I255 Ischemic cardiomyopathy: Secondary | ICD-10-CM | POA: Insufficient documentation

## 2017-03-22 MED ORDER — CARVEDILOL 6.25 MG PO TABS: 3.1250 mg | ORAL_TABLET | Freq: Two times a day (BID) | ORAL | 3 refills | Status: DC

## 2017-03-22 MED ORDER — CARVEDILOL 6.25 MG PO TABS
6.2500 mg | ORAL_TABLET | Freq: Two times a day (BID) | ORAL | 3 refills | Status: DC
Start: 2017-03-22 — End: 2017-05-03

## 2017-03-22 NOTE — Patient Instructions (Signed)
It was great to see you today!  You are going great!  Please increase your carvedilol dose to 6.25mg  twice daily (take 2 of the 3.125mg  tablets twice daily until these are gone, then you can pick up your new prescription.  Continue to take the rest of your medications as prescribed.  You can have 1 serving of fried foods a week, pick something good!

## 2017-03-22 NOTE — Progress Notes (Signed)
Primary Cardiologist: Dr. Haroldine Laws   HPI: Seth Byrd is a 71 y.o. AA male with history of chronic systolic CHF due to ischemic cardiomyopathy, Echo 02/09/17 LVEF 20-25%, OA, HTN, HLD, h/o prostate cancer, and COPD.   Admitted to [facility] 02/09/17 with chest discomfort, diaphoresis, and SOB. CVTS consulted and pt sent to [facility] for CABG consideration.  HF team consulted for optimization. Revascularization decided as priority and pt underwent CABG x 3 02/10/17.  Pt presents today for pharmacist lead HF medication titration. At last appointment 02/25/17 with [name], PA-C is was discovered that carvediolol was not continued at discharge as intended and [name] instructed patient to resume. Pt is adherent to all medications and states that he is feeling better every day. Denies any SOB, lightheadedness, dizziness or near syncope. States that he has very little if any edema when he walks for extended periods of time. Daughter currently cooks for patient and takes care of his pillbox. Daughter is very diligent about food, water and salt intake. Previously he ate a lot of fried foods and steak but not daughter is choosing more heart healthy foods and using Ms [name] for flavoring. Pt and family very excited and motivated about rehab and optimizing all aspects of health and care. Pt to start cardiac rehab in the next couple of weeks.   02/09/17 Echo LVEF 20-25%, Mild AI, Mild/ModMR, RV mildly dilated and mildly reduced   . Shortness of breath/dyspnea on exertion? yes - only when walking long distances, across the parking lot  . Orthopnea/PND? no . Edema? No - very little if patient is on his feet and walking for extended periods of time . Lightheadedness/dizziness? no . Daily weights at home? Yes - stable ~170-171 lb . Blood pressure/heart rate monitoring at home? no . Following low-sodium/fluid-restricted diet? No - this is better then it has been  HF Medications: [name] 25mg  daily Losartan 25mg   daily Carvedilol 3.125mg  BID Lasix 40mg  daily  Has the patient been experiencing any side effects to the medications prescribed?  no  Does the patient have any problems obtaining medications due to transportation or finances?   no  Understanding of regimen: excellent Understanding of indications: excellent Potential of compliance: excellent Patient understands to avoid NSAIDs. Patient understands to avoid decongestants.    Pertinent Lab Values: . 4/19: Serum creatinine 0.92, CO2 23, Potassium 4.1, Sodium 137   Vital Signs: . Weight: 172 (dry weight: 170-171 lbs, weighs this at home too) . Blood pressure: 116/78 mmHg  . Heart rate: 79 bpm   Assessment: 1. Chronicsystolic CHF (EF 64-33%), due to ICM. NYHA class IIsymptoms. - Basic disease state pathophysiology, medication indication, mechanism and side effects reviewed at length with patient and he verbalized understanding - Continue lasix 40 mg daily - Increase Coreg to 6.25 mg BID - Continue losartan 25 mg daily and spironolactone 25 mg daily - Reinforced fluid restriction to < 2 L daily, sodium restriction to less than 2000 mg daily, and the importance of daily weights.   - Can likely switch to Entresto at next visit if BP tolerates 2. CAD s/p CABG x 3 - Continue ASA 81 mg and Atorvastatin 40 mg daily - Review lipid panel with patient and importance of limiting ice cream, heavy dairy, and red meats 3. HLD - Continue statin and ASA 4. Anemia - Stable CBC, hgb chronically low 5. 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  Plan: 1) Medication changes: Based on  clinical presentation, vital signs and recent labs will increase carvedilol 6.25mg  BID, and continue losartan 25mg , spironolactone 25mg  daily, and lasix 40mg  daily. 2) Labs: NA 3) Follow-up: 1 month follow up with pharmacy  [name], PharmD, [place] PGY2 Pharmacy Resident  [name], PharmD,  BCPS, CPP Clinical Pharmacist Pager: [phone] Phone: [phone] 03/22/2017 2:04 PM

## 2017-04-16 ENCOUNTER — Other Ambulatory Visit: Payer: 59

## 2017-04-16 DIAGNOSIS — C61 Malignant neoplasm of prostate: Secondary | ICD-10-CM

## 2017-04-17 LAB — PSA: Prostate Specific Ag, Serum: 3.3 ng/mL (ref 0.0–4.0)

## 2017-04-19 ENCOUNTER — Encounter: Payer: 59 | Attending: Internal Medicine | Admitting: *Deleted

## 2017-04-19 ENCOUNTER — Encounter (HOSPITAL_COMMUNITY): Payer: Self-pay

## 2017-04-19 ENCOUNTER — Encounter: Payer: Self-pay | Admitting: *Deleted

## 2017-04-19 ENCOUNTER — Ambulatory Visit (HOSPITAL_COMMUNITY)
Admission: RE | Admit: 2017-04-19 | Discharge: 2017-04-19 | Disposition: A | Discharge status: Home / Self Care | Payer: 59 | Source: Ambulatory Visit | Attending: Internal Medicine | Admitting: Internal Medicine

## 2017-04-19 VITALS — Ht 70.5 in | Wt 176.8 lb

## 2017-04-19 DIAGNOSIS — E785 Hyperlipidemia, unspecified: Secondary | ICD-10-CM | POA: Diagnosis not present

## 2017-04-19 DIAGNOSIS — I255 Ischemic cardiomyopathy: Secondary | ICD-10-CM | POA: Insufficient documentation

## 2017-04-19 DIAGNOSIS — Z9889 Other specified postprocedural states: Secondary | ICD-10-CM | POA: Insufficient documentation

## 2017-04-19 DIAGNOSIS — D696 Thrombocytopenia, unspecified: Secondary | ICD-10-CM | POA: Insufficient documentation

## 2017-04-19 DIAGNOSIS — Z951 Presence of aortocoronary bypass graft: Secondary | ICD-10-CM | POA: Diagnosis not present

## 2017-04-19 DIAGNOSIS — I110 Hypertensive heart disease with heart failure: Secondary | ICD-10-CM | POA: Insufficient documentation

## 2017-04-19 DIAGNOSIS — I2510 Atherosclerotic heart disease of native coronary artery without angina pectoris: Secondary | ICD-10-CM | POA: Diagnosis not present

## 2017-04-19 DIAGNOSIS — Z8546 Personal history of malignant neoplasm of prostate: Secondary | ICD-10-CM | POA: Diagnosis not present

## 2017-04-19 DIAGNOSIS — J449 Chronic obstructive pulmonary disease, unspecified: Secondary | ICD-10-CM | POA: Insufficient documentation

## 2017-04-19 DIAGNOSIS — I5022 Chronic systolic (congestive) heart failure: Secondary | ICD-10-CM | POA: Insufficient documentation

## 2017-04-19 DIAGNOSIS — D649 Anemia, unspecified: Secondary | ICD-10-CM | POA: Diagnosis not present

## 2017-04-19 MED ORDER — SACUBITRIL-VALSARTAN 24-26 MG PO TABS: 1.0000 | ORAL_TABLET | Freq: Two times a day (BID) | ORAL | 11 refills | Status: DC

## 2017-04-19 NOTE — Patient Instructions (Addendum)
Patient Instructions  Patient Details  Name: Seth Byrd MRN: [id number] Date of Birth: January 19, 1946 Referring Provider:  [name], MD  Below are the personal goals you chose as well as exercise and nutrition goals. Our goal is to help you keep on track towards obtaining and maintaining your goals. We will be discussing your progress on these goals with you throughout the program.  Initial Exercise Prescription:     Initial Exercise Prescription - 04/19/17 1500      Date of Initial Exercise RX and Referring Provider   Date 04/19/17   Referring Provider [name] MD     Treadmill   MPH 2   Grade 0.5   Minutes 15   METs 2.67     Recumbant Bike   Level 2   RPM 50   Watts 20   Minutes 15   METs 2.7     NuStep   Level 1   SPM 80   Minutes 15   METs 2     Prescription Details   Frequency (times per week) 3   Duration Progress to 45 minutes of aerobic exercise without signs/symptoms of physical distress     Intensity   THRR 40-80% of Max Heartrate 106-135   Ratings of Perceived Exertion 11-13   Perceived Dyspnea 0-4     Progression   Progression Continue to progress workloads to maintain intensity without signs/symptoms of physical distress.     Resistance Training   Training Prescription Yes   Weight 3lbs   Reps 10-15      Exercise Goals: Frequency: Be able to perform aerobic exercise three times per week working toward 3-5 days per week.  Intensity: Work with a perceived exertion of 11 (fairly light) - 15 (hard) as tolerated. Follow your new exercise prescription and watch for changes in prescription as you progress with the program. Changes will be reviewed with you when they are made.  Duration: You should be able to do 30 minutes of continuous aerobic exercise in addition to a 5 minute warm-up and a 5 minute cool-down routine.  Nutrition Goals: Your personal nutrition goals will be established when you do your nutrition analysis with the  dietician.  The following are nutrition guidelines to follow: Cholesterol < 200mg /day Sodium < 1500mg /day Fiber: Men over 50 yrs - 30 grams per day  Personal Goals:     Personal Goals and Risk Factors at Admission - 04/19/17 1309      Core Components/Risk Factors/Patient Goals on Admission   Personal Goal Other Yes   Personal Goal Learn what to eat after his open heart surgery. Find out his lifting restriction from his MD if it is still 3 lbs or less   Intervention Dietician appt made for Lafe. Marti will see his surgeon this week   Expected Outcomes Heart healthy eating. Healing after his CABG surgery with no complications      Tobacco Use Initial Evaluation: History  Smoking Status  . Former Smoker  . Packs/day: 1.50  . Years: 35.00  . Types: Cigarettes  . Quit date: 04/09/2005  Smokeless Tobacco  . Never Used    Copy of goals given to participant.

## 2017-04-19 NOTE — Progress Notes (Signed)
Daily Session Note  Patient Details  Name: Seth Byrd MRN: [id number] Date of Birth: 02/05/1946 Referring Provider:    Encounter Date: 04/19/2017  Check In:     Session Check In - 04/19/17 1133      Check-In   Location [facility]   Staff Present Susanne Bice, RN, BSN, CCRP;Carroll Enterkin, RN, BSN;Jessica Hawkins, MA, ACSM RCEP, Exercise Physiologist   Supervising physician immediately available to respond to emergencies See telemetry face sheet for immediately available ER MD   Medication changes reported     No   Tobacco Cessation No Change   Warm-up and Cool-down Performed as group-led instruction         History  Smoking Status  . Former Smoker  . Packs/day: 1.50  . Years: 35.00  . Types: Cigarettes  . Quit date: 04/09/2005  Smokeless Tobacco  . Never Used    Goals Met:  Proper associated with RPD/PD & O2 Sat Exercise tolerated well Personal goals reviewed No report of cardiac concerns or symptoms  Goals Unmet:  Not Applicable  Comments:     Dr. Mark Miller is Medical Director for [facility] and [name]. 

## 2017-04-19 NOTE — Progress Notes (Signed)
Cardiac Individual Treatment Plan  Patient Details  Name: Seth Byrd MRN: [id number] Date of Birth: [birth year] Referring Provider:    Initial Encounter Date: 04/19/2017  Visit Diagnosis: S/P CABG x 3  Patient's Home Medications on Admission:  Current Outpatient Prescriptions:  .  aspirin EC 325 MG EC tablet, Take 1 tablet (325 mg total) by mouth daily., Disp: 30 tablet, Rfl: 0 .  atorvastatin (LIPITOR) 40 MG tablet, Take 1 tablet (40 mg total) by mouth daily at 6 PM., Disp: , Rfl:  .  carvedilol (COREG) 6.25 MG tablet, Take 1 tablet (6.25 mg total) by mouth 2 (two) times daily with a meal., Disp: 60 tablet, Rfl: 3 .  etanercept (ENBREL) 50 MG/ML injection, Inject 50 mg into the skin once a week. Saturday, Disp: , Rfl:  .  furosemide (LASIX) 40 MG tablet, Take 1 tablet (40 mg total) by mouth daily., Disp: 30 tablet, Rfl:  .  losartan (COZAAR) 25 MG tablet, Take 1 tablet (25 mg total) by mouth daily., Disp: , Rfl:  .  Multiple Vitamins-Minerals (ONE-A-DAY MENS 50+ ADVANTAGE PO), Take 1 tablet by mouth daily., Disp: , Rfl:  .  Omega-3 Fatty Acids (FISH OIL) 1200 MG CAPS, Take 1 capsule by mouth 3 (three) times daily., Disp: , Rfl:  .  spironolactone (ALDACTONE) 25 MG tablet, Take 1 tablet (25 mg total) by mouth daily., Disp: , Rfl:  .  traMADol (ULTRAM) 50 MG tablet, Take 1-2 tablets (50-100 mg total) by mouth every 4 (four) hours as needed for moderate pain., Disp: 30 tablet, Rfl: 0 .  vitamin B-12 (CYANOCOBALAMIN) 1000 MCG tablet, Take 1,000 mcg by mouth daily. , Disp: , Rfl:  .  acetaminophen (TYLENOL) 500 MG tablet, Take 500 mg by mouth every 6 (six) hours as needed., Disp: , Rfl:   Past Medical History: Past Medical History:  Diagnosis Date  . Arthritis   . Hypertension     Tobacco Use: History  Smoking Status  . Former Smoker  . Packs/day: 1.50  . Years: 35.00  . Types: Cigarettes  . Quit date: 04/09/2005  Smokeless Tobacco  . Never Used    Labs: Recent Review  Flowsheet Data    Labs for ITP Cardiac and Pulmonary Rehab Latest Ref Rng & Units 02/10/2017 02/11/2017 02/11/2017 02/11/2017 02/12/2017   Cholestrol 100 - 199 mg/dL - - - - -   LDLCALC 0 - 99 mg/dL - - - - -   HDL >39 mg/dL - - - - -   Trlycerides 0 - 149 mg/dL - - - - -   Hemoglobin A1c 4.8 - 5.6 % - - - - -   PHART 7.350 - 7.450 7.391 7.372 - - -   PCO2ART 32.0 - 48.0 mmHg 36.8 37.3 - - -   HCO3 20.0 - 28.0 mmol/L 22.3 21.6 - - -   TCO2 0 - 100 mmol/L 23 23 - 24 -   ACIDBASEDEF 0.0 - 2.0 mmol/L 2.0 3.0(H) - - -   O2SAT % 93.0 93.0 63.0 - 56.8       Exercise Target Goals:    Exercise Program Goal: Individual exercise prescription set with THRR, safety & activity barriers. Participant demonstrates ability to understand and report RPE using BORG scale, to self-measure pulse accurately, and to acknowledge the importance of the exercise prescription.  Exercise Prescription Goal: Starting with aerobic activity 30 plus minutes a day, 3 days per week for initial exercise prescription. Provide home exercise prescription and guidelines that participant acknowledges  understanding prior to discharge.  Activity Barriers & Risk Stratification:     Activity Barriers & Cardiac Risk Stratification - 04/19/17 1307      Activity Barriers & Cardiac Risk Stratification   Activity Barriers Arthritis;Joint Problems;Left Knee Replacement   Cardiac Risk Stratification High      6 Minute Walk:   Oxygen Initial Assessment:   Oxygen Re-Evaluation:   Oxygen Discharge (Final Oxygen Re-Evaluation):   Initial Exercise Prescription:   Perform Capillary Blood Glucose checks as needed.  Exercise Prescription Changes:   Exercise Comments:   Exercise Goals and Review:   Exercise Goals Re-Evaluation :   Discharge Exercise Prescription (Final Exercise Prescription Changes):   Nutrition:  Target Goals: Understanding of nutrition guidelines, daily intake of sodium 1500mg , cholesterol 200mg ,  calories 30% from fat and 7% or less from saturated fats, daily to have 5 or more servings of fruits and vegetables.  Biometrics:    Nutrition Therapy Plan and Nutrition Goals:     Nutrition Therapy & Goals - 04/19/17 1305      Nutrition Therapy   Drug/Food Interactions Statins/Certain Fruits     Intervention Plan   Intervention Prescribe, educate and counsel regarding individualized specific dietary modifications aiming towards targeted core components such as weight, hypertension, lipid management, diabetes, heart failure and other comorbidities.   Expected Outcomes Short Term Goal: Understand basic principles of dietary content, such as calories, fat, sodium, cholesterol and nutrients.      Nutrition Discharge: Rate Your Plate Scores:     Nutrition Assessments - 04/19/17 1305      MEDFICTS Scores   Pre Score 12      Nutrition Goals Re-Evaluation:   Nutrition Goals Discharge (Final Nutrition Goals Re-Evaluation):   Psychosocial: Target Goals: Acknowledge presence or absence of significant depression and/or stress, maximize coping skills, provide positive support system. Participant is able to verbalize types and ability to use techniques and skills needed for reducing stress and depression.   Initial Review & Psychosocial Screening:     Initial Psych Review & Screening - 04/19/17 1311      Family Dynamics   Good Support System? Yes     Barriers   Psychosocial barriers to participate in program There are no identifiable barriers or psychosocial needs.     Screening Interventions   Interventions Encouraged to exercise      Quality of Life Scores:      Quality of Life - 04/19/17 1300      Quality of Life Scores   Health/Function Pre 27.23 %   Socioeconomic Pre 26.57 %   Psych/Spiritual Pre 29.14 %   Family Pre 29.38 %   GLOBAL Pre 27.79 %      PHQ-9: Recent Review Flowsheet Data    Depression screen [facility] 2/9 04/19/2017 03/16/2017 08/07/2016 05/22/2016  11/25/2015   Decreased Interest 0 0 0 0 0   Down, Depressed, Hopeless 0 0 0 0 0   PHQ - 2 Score 0 0 0 0 0   Altered sleeping 0 - - - -   Tired, decreased energy 0 - - - -   Change in appetite 0 - - - -   Feeling bad or failure about yourself  0 - - - -   Trouble concentrating 0 - - - -   Moving slowly or fidgety/restless 0 - - - -   Suicidal thoughts 0 - - - -   PHQ-9 Score 0 - - - -     Interpretation of Total  Score  Total Score Depression Severity:  1-4 = Minimal depression, 5-9 = Mild depression, 10-14 = Moderate depression, 15-19 = Moderately severe depression, 20-27 = Severe depression   Psychosocial Evaluation and Intervention:   Psychosocial Re-Evaluation:   Psychosocial Discharge (Final Psychosocial Re-Evaluation):   Vocational Rehabilitation: Provide vocational rehab assistance to qualifying candidates.   Vocational Rehab Evaluation & Intervention:     Vocational Rehab - 04/19/17 1133      Initial Vocational Rehab Evaluation & Intervention   Assessment shows need for Vocational Rehabilitation No      Education: Education Goals: Education classes will be provided on a weekly basis, covering required topics. Participant will state understanding/return demonstration of topics presented.  Learning Barriers/Preferences:     Learning Barriers/Preferences - 04/19/17 1306      Learning Barriers/Preferences   Learning Barriers Reading   Learning Preferences Verbal Instruction;Skilled Demonstration      Education Topics: General Nutrition Guidelines/Fats and Fiber: -Group instruction provided by verbal, written material, models and posters to present the general guidelines for heart healthy nutrition. Gives an explanation and review of dietary fats and fiber.   Controlling Sodium/Reading Food Labels: -Group verbal and written material supporting the discussion of sodium use in heart healthy nutrition. Review and explanation with models, verbal and written  materials for utilization of the food label.   Exercise Physiology & Risk Factors: - Group verbal and written instruction with models to review the exercise physiology of the cardiovascular system and associated critical values. Details cardiovascular disease risk factors and the goals associated with each risk factor.   Aerobic Exercise & Resistance Training: - Gives group verbal and written discussion on the health impact of inactivity. On the components of aerobic and resistive training programs and the benefits of this training and how to safely progress through these programs.   Flexibility, Balance, General Exercise Guidelines: - Provides group verbal and written instruction on the benefits of flexibility and balance training programs. Provides general exercise guidelines with specific guidelines to those with heart or lung disease. Demonstration and skill practice provided.   Stress Management: - Provides group verbal and written instruction about the health risks of elevated stress, cause of high stress, and healthy ways to reduce stress.   Depression: - Provides group verbal and written instruction on the correlation between heart/lung disease and depressed mood, treatment options, and the stigmas associated with seeking treatment.   Anatomy & Physiology of the Heart: - Group verbal and written instruction and models provide basic cardiac anatomy and physiology, with the coronary electrical and arterial systems. Review of: AMI, Angina, Valve disease, Heart Failure, Cardiac Arrhythmia, Pacemakers, and the ICD.   Cardiac Procedures: - Group verbal and written instruction and models to describe the testing methods done to diagnose heart disease. Reviews the outcomes of the test results. Describes the treatment choices: Medical Management, Angioplasty, or Coronary Bypass Surgery.   Cardiac Medications: - Group verbal and written instruction to review commonly prescribed  medications for heart disease. Reviews the medication, class of the drug, and side effects. Includes the steps to properly store meds and maintain the prescription regimen.   Go Sex-Intimacy & Heart Disease, Get SMART - Goal Setting: - Group verbal and written instruction through game format to discuss heart disease and the return to sexual intimacy. Provides group verbal and written material to discuss and apply goal setting through the application of the S.M.A.R.T. Method.   Other Matters of the Heart: - Provides group verbal, written materials and models  to describe Heart Failure, Angina, Valve Disease, and Diabetes in the realm of heart disease. Includes description of the disease process and treatment options available to the cardiac patient.   Exercise & Equipment Safety: - Individual verbal instruction and demonstration of equipment use and safety with use of the equipment.   Cardiac Rehab from 04/19/2017 in [facility] Cardiac and Pulmonary Rehab  Date  04/19/17  Educator  C. EnterkinRN  Instruction Review Code  1- partially meets, needs review/practice      Infection Prevention: - Provides verbal and written material to individual with discussion of infection control including proper hand washing and proper equipment cleaning during exercise session.   Cardiac Rehab from 04/19/2017 in [facility] Cardiac and Pulmonary Rehab  Date  04/19/17  Educator  C. EnterkinRN  Instruction Review Code  2- meets goals/outcomes      Falls Prevention: - Provides verbal and written material to individual with discussion of falls prevention and safety.   Cardiac Rehab from 04/19/2017 in [facility] Cardiac and Pulmonary Rehab  Date  04/19/17  Educator  C. Bethany [place]  Instruction Review Code  2- meets goals/outcomes      Diabetes: - Individual verbal and written instruction to review signs/symptoms of diabetes, desired ranges of glucose level fasting, after meals and with exercise. Advice that pre and post  exercise glucose checks will be done for 3 sessions at entry of program.    Knowledge Questionnaire Score:     Knowledge Questionnaire Score - 04/19/17 1303      Knowledge Questionnaire Score   Pre Score 19      Core Components/Risk Factors/Patient Goals at Admission:     Personal Goals and Risk Factors at Admission - 04/19/17 1309      Core Components/Risk Factors/Patient Goals on Admission   Personal Goal Other Yes   Personal Goal Learn what to eat after his open heart surgery. Find out his lifting restriction from his MD if it is still 3 lbs or less   Intervention Dietician appt made for [place]. General will see his surgeon this week   Expected Outcomes Heart healthy eating. Healing after his CABG surgery with no complications      Core Components/Risk Factors/Patient Goals Review:    Core Components/Risk Factors/Patient Goals at Discharge (Final Review):    ITP Comments:     ITP Comments    Row Name 04/19/17 1301           ITP Comments ITP created during Medical Review after Cardiac Rehab informed consent was signed. DX listed in CHL/EPIC          Comments: Ready to start Cardiac Rehab. He gives himself Enbrel injections weekly for his Rheumatoid Arthritis which he has pain off and on all day in his joints.

## 2017-04-19 NOTE — Progress Notes (Signed)
HF MD: Demarco Bacci  HPI:  Seth Byrd a 71 y.o.AA malewith history of chronic systolic CHF due to ischemic cardiomyopathy, Echo 02/09/17 LVEF 20-25%, OA, HTN, HLD, h/o prostate cancer, and COPD.   Admitted to [facility] 02/09/17 with chest discomfort, diaphoresis, and SOB. CVTS consulted and pt sent to [facility] for CABG consideration. HF team consulted for optimization. Revascularization decided as priority and pt underwent CABG x 3 02/10/17.  Pt presents today for pharmacist led HF medication titration. At last appointment 03/22/17 pt's carvedilol was increased to 6.25mg  BID and he was instructed to continue the rest of his medications. Pt is adherent to all medications and states that he is feeling better every day. Denies any SOB, lightheadedness, dizziness or near syncope. Appears euvolemic. Started cardiac rehab today and wonders how much weight he can lift. Daughter currently cooks for patient and takes care of his pillbox. Daughter is very diligent about food, water and salt intake. Previously he ate a lot of fried foods and steak but not daughter is choosing more heart healthy foods and using Ms [name] for flavoring.   02/09/17 Echo LVEF 20-25%, Mild AI, Mild/ModMR, RV mildly dilated and mildly reduced   . Shortness of breath/dyspnea on exertion? no  . Orthopnea/PND? no . Edema? no . Lightheadedness/dizziness? No - describes as "feeling funny" right after carvedilol dose increase, has resolved . Daily weights at home? yes . Blood pressure/heart rate monitoring at home? no . Following low-sodium/fluid-restricted diet? Attempting to follow diet restrictions - has "cheat day" on Saturdays. Most food is home made, cooks with Ms. Dash, trying to avoid fried foods.  . Has been taking ASA 325 since April after CABG  . Started cardiac rehab today (04/19/17) and is feeling a little tired but enjoyed it. Has questions about how much weight he can lift at cardiac rehab. Patient thinks it is 3 lbs.  HF  Medications: Seth Byrd 25mg  daily Losartan 25mg  daily Carvedilol 6.25mg  BID Lasix 40mg  daily  Has the patient been experiencing any side effects to the medications prescribed?  no  Does the patient have any problems obtaining medications due to transportation or finances?   no  Understanding of regimen: good Understanding of indications: good Potential of compliance: good Patient understands to avoid NSAIDs. Patient understands to avoid decongestants.  Pertinent Lab Values:  02/25/17: Serum creatinine 0.92, CO2 23, Potassium 4.1, Sodium 137  Vital Signs: . Blood pressure: 128/72 mmHg . Heart rate: 71 bpm  . Weight : 174 lbs (dry weight 170-171, per patient has been around 173-174 lately)  Assessment: 1. Chronicsystolic CHF (EF 76-19% on ECHO 02/10/17), due to ICM. NYHA class IIsymptoms.  - Volume status stable   - D/c losartan, add entresto 24/26 mg BID  - Continue coreg 6.25 BID, lasix 40 mg daily, spironolactone 25 mg daily - Basic disease state pathophysiology, medication indication, mechanism and side effects reviewed at length with patient and he verbalized understanding - Reinforced fluid restriction to <2 L daily, sodium restriction to less than 2000 mg daily, and the importance of daily weights.  2. 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 HLD - Continue statin and ASA 4. Anemia - Stable CBC per last labs, hgb chronically low 5. 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  Plan: 1) Medication changes: Based on clinical presentation, vital signs  and recent labs will d/c losartan, add entresto 24-26 mg BID 2) Labs: follow up BMET in 2 weeks 3) Follow-up: Pharmacy visit on 05/03/17  Seth Byrd, PharmD, PGY1 Pharmacy Resident  04/19/2017 4:16 PM  Seth Byrd, PharmD, BCPS  Seth Byrd, PharmD,  BCPS, CPP Clinical Pharmacist Pager: [phone] Phone: [phone] 04/19/2017 2:19 PM   Agree with above.  Seth Bickers, MD  6:39 PM

## 2017-04-19 NOTE — Patient Instructions (Addendum)
It was great to see you today! We were happy to hear that you have gotten started with Cardiac Rehab Keep working on your diet - focus on salt (sodium) instead of calories. Limit sodium intake to less than 2,000-3,000 mg a day. Limit fluids to less than 2 liters a day.   Finish out this week's worth of losartan and then change to entresto. Take 1 tablet by mouth twice a day. Bring your copay cards to the pharmacy, the first month should be free and then it should be no more than $10/month for the next year.  Continue taking carvedilol 6.25 mg twice a day, spironolactone 25 mg once a day, lasix 40 mg daily  Dr. Haroldine Laws will check on how much weight you can lift at Cardiac Rehab  We will see you again on 6/25 at 2:45pm

## 2017-04-20 ENCOUNTER — Encounter: Payer: Self-pay | Admitting: Urology

## 2017-04-20 ENCOUNTER — Ambulatory Visit (INDEPENDENT_AMBULATORY_CARE_PROVIDER_SITE_OTHER): Payer: 59 | Admitting: Urology

## 2017-04-20 VITALS — BP 109/68 | HR 80 | Ht 71.0 in | Wt 176.9 lb

## 2017-04-20 DIAGNOSIS — C61 Malignant neoplasm of prostate: Secondary | ICD-10-CM

## 2017-04-20 NOTE — Progress Notes (Signed)
04/20/2017 12:01 PM   Seth Byrd 1946-08-28 [id number]  Referring provider: Roselee Nova, MD [address] [place], [place] [address]  Chief Complaint  Patient presents with  . Follow-up    4 month prostate cancer    HPI: Follow-up low risk prostate cancer- January 2018 low risk prostate cancer PSA 4.8, T1c, 36 g prostate Gleason 3+3 = 6 in 1 of 12 cores, right apex, 3%  F/u PSA Jan 2018 dropped to 4.1. Current, Jun 2018 PSA is 3.3. He has no pain or LUTS.   His father had prostate cancer. Patient denied any lower urinary tract symptoms.  PMH: Past Medical History:  Diagnosis Date  . Arthritis   . Hypertension     Surgical History: Past Surgical History:  Procedure Laterality Date  . BACK SURGERY     Spinal Fusion with 2 screws  . CORONARY ARTERY BYPASS GRAFT N/A 02/10/2017   Procedure: CORONARY ARTERY BYPASS GRAFTING (CABG), ON PUMP, TIMES THREE, USING LEFT INTERNAL MAMMARY ARTERY AND ENDOSCOPICALLY HARVESTED BILATERAL GREATER SAPHENOUS VEINS;  Surgeon: [name], MD;  Location: [facility];  Service: Open Heart Surgery;  Laterality: N/A;  LIMA to LAD SVG to Diag 1 SVG to OM1  . JOINT REPLACEMENT    . LEFT HEART CATH AND CORONARY ANGIOGRAPHY N/A 02/09/2017   Procedure: Left Heart Cath and Coronary Angiography;  Surgeon: [name], MD;  Location: [facility];  Service: Cardiovascular;  Laterality: N/A;  . TEE WITHOUT CARDIOVERSION N/A 02/10/2017   Procedure: TRANSESOPHAGEAL ECHOCARDIOGRAM (TEE);  Surgeon: [name], MD;  Location: [facility];  Service: Open Heart Surgery;  Laterality: N/A;  . TOTAL KNEE ARTHROPLASTY Left 04/23/2015   Procedure: TOTAL KNEE ARTHROPLASTY;  Surgeon: Christophe Louis, MD;  Location: [facility];  Service: Orthopedics;  Laterality: Left;    Home Medications:  Allergies as of 04/20/2017   No Known Allergies     Medication List       Accurate as of 04/20/17 12:01 PM. Always use your most recent med  list.          acetaminophen 500 MG tablet Commonly known as:  TYLENOL Take 500 mg by mouth every 6 (six) hours as needed.   aspirin 325 MG EC tablet Take 1 tablet (325 mg total) by mouth daily.   atorvastatin 40 MG tablet Commonly known as:  LIPITOR Take 1 tablet (40 mg total) by mouth daily at 6 PM.   carvedilol 6.25 MG tablet Commonly known as:  COREG Take 1 tablet (6.25 mg total) by mouth 2 (two) times daily with a meal.   etanercept 50 MG/ML injection Commonly known as:  ENBREL Inject 50 mg into the skin once a week. Saturday   Fish Oil 1200 MG Caps Take 1 capsule by mouth 2 (two) times daily.   furosemide 40 MG tablet Commonly known as:  LASIX Take 1 tablet (40 mg total) by mouth daily.   ONE-A-DAY MENS 50+ ADVANTAGE PO Take 1 tablet by mouth daily.   sacubitril-valsartan 24-26 MG Commonly known as:  ENTRESTO Take 1 tablet by mouth 2 (two) times daily.   spironolactone 25 MG tablet Commonly known as:  ALDACTONE Take 1 tablet (25 mg total) by mouth daily.   vitamin B-12 1000 MCG tablet Commonly known as:  CYANOCOBALAMIN Take 1,000 mcg by mouth daily.       Allergies: No Known Allergies  Family History: Family History  Problem Relation Age of Onset  . Hypertension  Mother   . Transient ischemic attack Mother   . CVA Mother   . Colon cancer Father   . Hypertension Father   . Prostate cancer Father   . Diabetes Brother   . Kidney cancer Neg Hx   . Bladder Cancer Neg Hx     Social History:  reports that he quit smoking about 12 years ago. His smoking use included Cigarettes. He has a 52.50 pack-year smoking history. He has never used smokeless tobacco. He reports that he does not drink alcohol or use drugs.  ROS:                                        Physical Exam: BP 109/68   Pulse 80   Ht 5\' 11"  (1.803 m)   Wt 80.2 kg (176 lb 14.4 oz)   BMI 24.67 kg/m   Constitutional:  Alert and oriented, No acute  distress. HEENT: [place] AT, moist mucus membranes.  Trachea midline, no masses. Cardiovascular: No clubbing, cyanosis, or edema. Respiratory: Normal respiratory effort, no increased work of breathing. GI: Abdomen is soft, nontender, nondistended, no abdominal masses GU: No CVA tenderness.  Skin: No rashes, bruises or suspicious lesions. Neurologic: Grossly intact, no focal deficits, moving all 4 extremities. Psychiatric: Normal mood and affect.  Laboratory Data: Lab Results  Component Value Date   WBC 6.6 02/25/2017   HGB 8.8 (L) 02/25/2017   HCT 27.5 (L) 02/25/2017   MCV 95.8 02/25/2017   PLT 473 (H) 02/25/2017    Lab Results  Component Value Date   CREATININE 0.92 02/25/2017    No results found for: PSA  No results found for: TESTOSTERONE  Lab Results  Component Value Date   HGBA1C 5.0 02/10/2017    Urinalysis    Component Value Date/Time   COLORURINE YELLOW (A) 04/10/2015 0954   APPEARANCEUR CLEAR (A) 04/10/2015 0954   LABSPEC 1.016 04/10/2015 0954   PHURINE 6.0 04/10/2015 [place] 04/10/2015 0954   HGBUR NEGATIVE 04/10/2015 0954   [place] NEGATIVE 04/10/2015 0954   [place] 04/10/2015 0954   PROTEINUR NEGATIVE 04/10/2015 0954   NITRITE NEGATIVE 04/10/2015 0954   LEUKOCYTESUR NEGATIVE 04/10/2015 0954    Assessment & Plan:    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 We discussed brachytherapy and external beam. We discussed the risk of active surveillance is prostate cancer growth and spread which is non-commonly low risk disease. His PSA density remains quite normal. Will see in 6 mo with PSA for DRE.    There are no diagnoses linked to this encounter.  No Follow-up on file.  Festus Aloe, [facility] [address], [place], [place] [address] [phone]

## 2017-04-21 ENCOUNTER — Telehealth (HOSPITAL_COMMUNITY): Payer: Self-pay | Admitting: Pharmacist

## 2017-04-21 DIAGNOSIS — Z951 Presence of aortocoronary bypass graft: Secondary | ICD-10-CM

## 2017-04-21 NOTE — Telephone Encounter (Signed)
Called Mr. Donahoe to follow up on question re:how much weight he can lift while at cardiac rehab. Per Dr. Haroldine Laws, no more than 25-30 lbs OR max weight tolerated without isometric effort/straining. Patient verbalized understanding and denied further questions.   Carlean Jews, Pharm.D. PGY1 Pharmacy Resident 6/13/20189:05 AM Pager [phone]

## 2017-04-21 NOTE — Progress Notes (Signed)
Daily Session Note  Patient Details  Name: Seth Byrd MRN: [id number] Date of Birth: 01-23-1946 Referring Provider:     Cardiac Rehab from 04/19/2017 in [facility] Cardiac and Pulmonary Rehab  Referring Provider  [name] MD      Encounter Date: 04/21/2017  Check In:     Session Check In - 04/21/17 0818      Check-In   Location [facility]-Cardiac & Pulmonary Rehab   Staff Present Heath Lark, RN, BSN, CCRP;Jessica Luan Pulling, MA, ACSM RCEP, Exercise Physiologist;Amanda Oletta Darter, BA, ACSM CEP, Exercise Physiologist   Supervising physician immediately available to respond to emergencies See telemetry face sheet for immediately available ER MD   Medication changes reported     No   Fall or balance concerns reported    No   Warm-up and Cool-down Performed on first and last piece of equipment   Resistance Training Performed Yes   VAD Patient? No     Pain Assessment   Currently in Pain? No/denies         History  Smoking Status  . Former Smoker  . Packs/day: 1.50  . Years: 35.00  . Types: Cigarettes  . Quit date: 04/09/2005  Smokeless Tobacco  . Never Used    Goals Met:  Independence with exercise equipment Exercise tolerated well No report of cardiac concerns or symptoms Strength training completed today  Goals Unmet:  Not Applicable  Comments: First full day of exercise!  Patient was oriented to gym and equipment including functions, settings, policies, and procedures.  Patient's individual exercise prescription and treatment plan were reviewed.  All starting workloads were established based on the results of the 6 minute walk test done at initial orientation visit.  The plan for exercise progression was also introduced and progression will be customized based on patient's performance and goals.    Dr. Emily Filbert is Medical Director for [facility] and [name].

## 2017-04-23 ENCOUNTER — Encounter: Payer: 59 | Admitting: *Deleted

## 2017-04-23 DIAGNOSIS — Z951 Presence of aortocoronary bypass graft: Secondary | ICD-10-CM | POA: Diagnosis not present

## 2017-04-23 NOTE — Progress Notes (Signed)
Daily Session Note  Patient Details  Name: Seth Byrd MRN: [id number] Date of Birth: 04/22/1946 Referring Provider:     Cardiac Rehab from 04/19/2017 in [facility] Cardiac and Pulmonary Rehab  Referring Provider  Fath, Kenneth MD      Encounter Date: 04/23/2017  Check In:     Session Check In - 04/23/17 0854      Check-In   Location [facility]-Cardiac & Pulmonary Rehab   Staff Present Carroll Enterkin, RN, BSN;Susanne Bice, RN, BSN, CCRP;Diane Wright RN,BSN;Joseph Hood RCP,RRT,BSRT;Amanda Sommer, BA, ACSM CEP, Exercise Physiologist   Supervising physician immediately available to respond to emergencies See telemetry face sheet for immediately available ER MD   Medication changes reported     No   Tobacco Cessation No Change   Warm-up and Cool-down Performed on first and last piece of equipment   Resistance Training Performed Yes   VAD Patient? No     Pain Assessment   Currently in Pain? No/denies         History  Smoking Status  . Former Smoker  . Packs/day: 1.50  . Years: 35.00  . Types: Cigarettes  . Quit date: 04/09/2005  Smokeless Tobacco  . Never Used    Goals Met:  Proper associated with RPD/PD & O2 Sat Changing diet to healthy choices, watching portion sizes No report of cardiac concerns or symptoms Strength training completed today  Goals Unmet:  Not Applicable  Comments:     Dr. Mark Miller is Medical Director for [facility] and [name]. 

## 2017-04-26 ENCOUNTER — Encounter: Payer: 59 | Admitting: *Deleted

## 2017-04-26 DIAGNOSIS — Z951 Presence of aortocoronary bypass graft: Secondary | ICD-10-CM

## 2017-04-26 NOTE — Progress Notes (Signed)
Daily Session Note  Patient Details  Name: Seth Byrd MRN: [id number] Date of Birth: 1945-11-16 Referring Provider:     Cardiac Rehab from 04/19/2017 in [facility] Cardiac and Pulmonary Rehab  Referring Provider  [name] MD      Encounter Date: 04/26/2017  Check In:     Session Check In - 04/26/17 0803      Check-In   Location [facility]-Cardiac & Pulmonary Rehab   Staff Present Earlean Shawl, BS, ACSM CEP, Exercise Physiologist;Jessica Luan Pulling, MA, ACSM RCEP, Exercise Physiologist;Carroll Enterkin, RN, BSN   Supervising physician immediately available to respond to emergencies See telemetry face sheet for immediately available ER MD   Medication changes reported     No   Fall or balance concerns reported    No   Warm-up and Cool-down Performed on first and last piece of equipment   Resistance Training Performed Yes   VAD Patient? No     Pain Assessment   Currently in Pain? No/denies   Multiple Pain Sites No         History  Smoking Status  . Former Smoker  . Packs/day: 1.50  . Years: 35.00  . Types: Cigarettes  . Quit date: 04/09/2005  Smokeless Tobacco  . Never Used    Goals Met:  Independence with exercise equipment Exercise tolerated well No report of cardiac concerns or symptoms Strength training completed today  Goals Unmet:  Not Applicable  Comments: Pt able to follow exercise prescription today without complaint.  Will continue to monitor for progression.    Dr. Emily Filbert is Medical Director for [facility] and [name].

## 2017-04-28 ENCOUNTER — Encounter: Payer: Self-pay | Admitting: *Deleted

## 2017-04-28 ENCOUNTER — Encounter: Payer: 59 | Admitting: *Deleted

## 2017-04-28 DIAGNOSIS — Z951 Presence of aortocoronary bypass graft: Secondary | ICD-10-CM | POA: Diagnosis not present

## 2017-04-28 NOTE — Progress Notes (Signed)
Cardiac Individual Treatment Plan  Patient Details  Name: Seth Byrd MRN: [id number] Date of Birth: [birth year] Referring Provider:     Cardiac Rehab from 04/19/2017 in [facility] Cardiac and Pulmonary Rehab  Referring Provider  [name] MD      Initial Encounter Date:    Cardiac Rehab from 04/19/2017 in [facility] Cardiac and Pulmonary Rehab  Date  04/19/17  Referring Provider  [name] MD      Visit Diagnosis: S/P CABG x 3  Patient's Home Medications on Admission:  Current Outpatient Prescriptions:  .  acetaminophen (TYLENOL) 500 MG tablet, Take 500 mg by mouth every 6 (six) hours as needed., Disp: , Rfl:  .  aspirin EC 325 MG EC tablet, Take 1 tablet (325 mg total) by mouth daily., Disp: 30 tablet, Rfl: 0 .  atorvastatin (LIPITOR) 40 MG tablet, Take 1 tablet (40 mg total) by mouth daily at 6 PM., Disp: , Rfl:  .  carvedilol (COREG) 6.25 MG tablet, Take 1 tablet (6.25 mg total) by mouth 2 (two) times daily with a meal., Disp: 60 tablet, Rfl: 3 .  etanercept (ENBREL) 50 MG/ML injection, Inject 50 mg into the skin once a week. Saturday, Disp: , Rfl:  .  furosemide (LASIX) 40 MG tablet, Take 1 tablet (40 mg total) by mouth daily., Disp: 30 tablet, Rfl:  .  Multiple Vitamins-Minerals (ONE-A-DAY MENS 50+ ADVANTAGE PO), Take 1 tablet by mouth daily., Disp: , Rfl:  .  Omega-3 Fatty Acids (FISH OIL) 1200 MG CAPS, Take 1 capsule by mouth 2 (two) times daily. , Disp: , Rfl:  .  sacubitril-valsartan (ENTRESTO) 24-26 MG, Take 1 tablet by mouth 2 (two) times daily., Disp: 60 tablet, Rfl: 11 .  spironolactone (ALDACTONE) 25 MG tablet, Take 1 tablet (25 mg total) by mouth daily., Disp: , Rfl:  .  vitamin B-12 (CYANOCOBALAMIN) 1000 MCG tablet, Take 1,000 mcg by mouth daily. , Disp: , Rfl:   Past Medical History: Past Medical History:  Diagnosis Date  . Arthritis   . Hypertension     Tobacco Use: History  Smoking Status  . Former Smoker  . Packs/day: 1.50  . Years: 35.00  . Types:  Cigarettes  . Quit date: 04/09/2005  Smokeless Tobacco  . Never Used    Labs: Recent Review Flowsheet Data    Labs for ITP Cardiac and Pulmonary Rehab Latest Ref Rng & Units 02/10/2017 02/11/2017 02/11/2017 02/11/2017 02/12/2017   Cholestrol 100 - 199 mg/dL - - - - -   LDLCALC 0 - 99 mg/dL - - - - -   HDL >39 mg/dL - - - - -   Trlycerides 0 - 149 mg/dL - - - - -   Hemoglobin A1c 4.8 - 5.6 % - - - - -   PHART 7.350 - 7.450 7.391 7.372 - - -   PCO2ART 32.0 - 48.0 mmHg 36.8 37.3 - - -   HCO3 20.0 - 28.0 mmol/L 22.3 21.6 - - -   TCO2 0 - 100 mmol/L 23 23 - 24 -   ACIDBASEDEF 0.0 - 2.0 mmol/L 2.0 3.0(H) - - -   O2SAT % 93.0 93.0 63.0 - 56.8       Exercise Target Goals:    Exercise Program Goal: Individual exercise prescription set with THRR, safety & activity barriers. Participant demonstrates ability to understand and report RPE using BORG scale, to self-measure pulse accurately, and to acknowledge the importance of the exercise prescription.  Exercise Prescription Goal: Starting with aerobic activity  30 plus minutes a day, 3 days per week for initial exercise prescription. Provide home exercise prescription and guidelines that participant acknowledges understanding prior to discharge.  Activity Barriers & Risk Stratification:     Activity Barriers & Cardiac Risk Stratification - 04/19/17 1307      Activity Barriers & Cardiac Risk Stratification   Activity Barriers Arthritis;Joint Problems;Left Knee Replacement;Deconditioning  athritis in hands and wrist   Cardiac Risk Stratification High      6 Minute Walk:     6 Minute Walk    Row Name 04/19/17 1506         6 Minute Walk   Phase Initial     Distance 1112 feet     Walk Time 6 minutes     # of Rest Breaks 0     MPH 2.11     METS 2.78     RPE 16     VO2 Peak 9.74     Symptoms No     Resting HR 77 bpm     Resting BP 126/60     Max Ex. HR 96 bpm     Max Ex. BP 136/56     2 Minute Post BP 130/60        Oxygen  Initial Assessment:   Oxygen Re-Evaluation:   Oxygen Discharge (Final Oxygen Re-Evaluation):   Initial Exercise Prescription:     Initial Exercise Prescription - 04/19/17 1500      Date of Initial Exercise RX and Referring Provider   Date 04/19/17   Referring Provider [name] MD     Treadmill   MPH 2   Grade 0.5   Minutes 15   METs 2.67     Recumbant Bike   Level 2   RPM 50   Watts 20   Minutes 15   METs 2.7     NuStep   Level 1   SPM 80   Minutes 15   METs 2     Prescription Details   Frequency (times per week) 3   Duration Progress to 45 minutes of aerobic exercise without signs/symptoms of physical distress     Intensity   THRR 40-80% of Max Heartrate 106-135   Ratings of Perceived Exertion 11-13   Perceived Dyspnea 0-4     Progression   Progression Continue to progress workloads to maintain intensity without signs/symptoms of physical distress.     Resistance Training   Training Prescription Yes   Weight 3lbs   Reps 10-15      Perform Capillary Blood Glucose checks as needed.  Exercise Prescription Changes:     Exercise Prescription Changes    Row Name 04/19/17 1500 04/21/17 1500           Response to Exercise   Blood Pressure (Admit) 126/60 110/56      Blood Pressure (Exercise) 136/56 136/70      Blood Pressure (Exit) 130/60 110/68      Heart Rate (Admit) 77 bpm 81 bpm      Heart Rate (Exercise) 96 bpm 111 bpm      Heart Rate (Exit) 82 bpm 83 bpm      Oxygen Saturation (Admit) 96 %  -      Oxygen Saturation (Exercise) 99 %  -      Perceived Dyspnea (Exercise) 16 12      Symptoms none none      Comments walk test result  -      Duration  - Continue  with 45 min of aerobic exercise without signs/symptoms of physical distress.      Intensity  - THRR unchanged        Progression   Progression  - Continue to progress workloads to maintain intensity without signs/symptoms of physical distress.      Average METs  - 2.73         Resistance Training   Training Prescription  - Yes      Weight  - 3lbs      Reps  - 10-15        Interval Training   Interval Training  - No        Treadmill   MPH  - 2      Grade  - 0.5      Minutes  - 15      METs  - 2.67        Recumbant Bike   Level  - 2      Watts  - 20      Minutes  - 15      METs  - 2.79         Exercise Comments:     Exercise Comments    Row Name 04/21/17 0950           Exercise Comments First full day of exercise!  Patient was oriented to gym and equipment including functions, settings, policies, and procedures.  Patient's individual exercise prescription and treatment plan were reviewed.  All starting workloads were established based on the results of the 6 minute walk test done at initial orientation visit.  The plan for exercise progression was also introduced and progression will be customized based on patient's performance and goals.          Exercise Goals and Review:     Exercise Goals    Row Name 04/19/17 1518             Exercise Goals   Increase Physical Activity Yes       Intervention Provide advice, education, support and counseling about physical activity/exercise needs.;Develop an individualized exercise prescription for aerobic and resistive training based on initial evaluation findings, risk stratification, comorbidities and participant's personal goals.       Expected Outcomes Achievement of increased cardiorespiratory fitness and enhanced flexibility, muscular endurance and strength shown through measurements of functional capacity and personal statement of participant.       Increase Strength and Stamina Yes       Intervention Provide advice, education, support and counseling about physical activity/exercise needs.;Develop an individualized exercise prescription for aerobic and resistive training based on initial evaluation findings, risk stratification, comorbidities and participant's personal goals.       Expected  Outcomes Achievement of increased cardiorespiratory fitness and enhanced flexibility, muscular endurance and strength shown through measurements of functional capacity and personal statement of participant.          Exercise Goals Re-Evaluation :     Exercise Goals Re-Evaluation    Row Name 04/21/17 1550             Exercise Goal Re-Evaluation   Exercise Goals Review Increase Physical Activity;Increase Strenth and Stamina       Comments Clara has completed is first day of exercise.  We will continue to monitor his progression.       Expected Outcomes Continue to come to classes to work on increasing activity and improving strength and stamina.          Discharge  Exercise Prescription (Final Exercise Prescription Changes):     Exercise Prescription Changes - 04/21/17 1500      Response to Exercise   Blood Pressure (Admit) 110/56   Blood Pressure (Exercise) 136/70   Blood Pressure (Exit) 110/68   Heart Rate (Admit) 81 bpm   Heart Rate (Exercise) 111 bpm   Heart Rate (Exit) 83 bpm   Perceived Dyspnea (Exercise) 12   Symptoms none   Duration Continue with 45 min of aerobic exercise without signs/symptoms of physical distress.   Intensity THRR unchanged     Progression   Progression Continue to progress workloads to maintain intensity without signs/symptoms of physical distress.   Average METs 2.73     Resistance Training   Training Prescription Yes   Weight 3lbs   Reps 10-15     Interval Training   Interval Training No     Treadmill   MPH 2   Grade 0.5   Minutes 15   METs 2.67     Recumbant Bike   Level 2   Watts 20   Minutes 15   METs 2.79      Nutrition:  Target Goals: Understanding of nutrition guidelines, daily intake of sodium 1500mg , cholesterol 200mg , calories 30% from fat and 7% or less from saturated fats, daily to have 5 or more servings of fruits and vegetables.  Biometrics:     Pre Biometrics - 04/19/17 1519      Pre Biometrics    Height 5' 10.5" (1.791 m)   Weight 176 lb 12.8 oz (80.2 kg)   Waist Circumference 34 inches   Hip Circumference 35.25 inches   Waist to Hip Ratio 0.96 %   BMI (Calculated) 25.1   Single Leg Stand 1.12 seconds       Nutrition Therapy Plan and Nutrition Goals:     Nutrition Therapy & Goals - 04/19/17 1305      Nutrition Therapy   Drug/Food Interactions Statins/Certain Fruits     Intervention Plan   Intervention Prescribe, educate and counsel regarding individualized specific dietary modifications aiming towards targeted core components such as weight, hypertension, lipid management, diabetes, heart failure and other comorbidities.   Expected Outcomes Short Term Goal: Understand basic principles of dietary content, such as calories, fat, sodium, cholesterol and nutrients.      Nutrition Discharge: Rate Your Plate Scores:     Nutrition Assessments - 04/19/17 1305      MEDFICTS Scores   Pre Score 12      Nutrition Goals Re-Evaluation:   Nutrition Goals Discharge (Final Nutrition Goals Re-Evaluation):   Psychosocial: Target Goals: Acknowledge presence or absence of significant depression and/or stress, maximize coping skills, provide positive support system. Participant is able to verbalize types and ability to use techniques and skills needed for reducing stress and depression.   Initial Review & Psychosocial Screening:     Initial Psych Review & Screening - 04/19/17 1311      Family Dynamics   Good Support System? Yes     Barriers   Psychosocial barriers to participate in program There are no identifiable barriers or psychosocial needs.     Screening Interventions   Interventions Encouraged to exercise      Quality of Life Scores:      Quality of Life - 04/19/17 1300      Quality of Life Scores   Health/Function Pre 27.23 %   Socioeconomic Pre 26.57 %   Psych/Spiritual Pre 29.14 %   Family Pre 29.38 %  GLOBAL Pre 27.79 %      PHQ-9: Recent  Review Flowsheet Data    Depression screen [facility] 2/9 04/19/2017 03/16/2017 08/07/2016 05/22/2016 11/25/2015   Decreased Interest 0 0 0 0 0   Down, Depressed, Hopeless 0 0 0 0 0   PHQ - 2 Score 0 0 0 0 0   Altered sleeping 0 - - - -   Tired, decreased energy 0 - - - -   Change in appetite 0 - - - -   Feeling bad or failure about yourself  0 - - - -   Trouble concentrating 0 - - - -   Moving slowly or fidgety/restless 0 - - - -   Suicidal thoughts 0 - - - -   PHQ-9 Score 0 - - - -     Interpretation of Total Score  Total Score Depression Severity:  1-4 = Minimal depression, 5-9 = Mild depression, 10-14 = Moderate depression, 15-19 = Moderately severe depression, 20-27 = Severe depression   Psychosocial Evaluation and Intervention:   Psychosocial Re-Evaluation:   Psychosocial Discharge (Final Psychosocial Re-Evaluation):   Vocational Rehabilitation: Provide vocational rehab assistance to qualifying candidates.   Vocational Rehab Evaluation & Intervention:     Vocational Rehab - 04/19/17 1133      Initial Vocational Rehab Evaluation & Intervention   Assessment shows need for Vocational Rehabilitation No      Education: Education Goals: Education classes will be provided on a weekly basis, covering required topics. Participant will state understanding/return demonstration of topics presented.  Learning Barriers/Preferences:     Learning Barriers/Preferences - 04/19/17 1306      Learning Barriers/Preferences   Learning Barriers Reading   Learning Preferences Verbal Instruction;Skilled Demonstration      Education Topics: General Nutrition Guidelines/Fats and Fiber: -Group instruction provided by verbal, written material, models and posters to present the general guidelines for heart healthy nutrition. Gives an explanation and review of dietary fats and fiber.   Controlling Sodium/Reading Food Labels: -Group verbal and written material supporting the discussion of sodium  use in heart healthy nutrition. Review and explanation with models, verbal and written materials for utilization of the food label.   Exercise Physiology & Risk Factors: - Group verbal and written instruction with models to review the exercise physiology of the cardiovascular system and associated critical values. Details cardiovascular disease risk factors and the goals associated with each risk factor.   Aerobic Exercise & Resistance Training: - Gives group verbal and written discussion on the health impact of inactivity. On the components of aerobic and resistive training programs and the benefits of this training and how to safely progress through these programs.   Flexibility, Balance, General Exercise Guidelines: - Provides group verbal and written instruction on the benefits of flexibility and balance training programs. Provides general exercise guidelines with specific guidelines to those with heart or lung disease. Demonstration and skill practice provided.   Stress Management: - Provides group verbal and written instruction about the health risks of elevated stress, cause of high stress, and healthy ways to reduce stress.   Depression: - Provides group verbal and written instruction on the correlation between heart/lung disease and depressed mood, treatment options, and the stigmas associated with seeking treatment.   Anatomy & Physiology of the Heart: - Group verbal and written instruction and models provide basic cardiac anatomy and physiology, with the coronary electrical and arterial systems. Review of: AMI, Angina, Valve disease, Heart Failure, Cardiac Arrhythmia, Pacemakers, and the ICD.  Cardiac Procedures: - Group verbal and written instruction and models to describe the testing methods done to diagnose heart disease. Reviews the outcomes of the test results. Describes the treatment choices: Medical Management, Angioplasty, or Coronary Bypass Surgery.   Cardiac  Medications: - Group verbal and written instruction to review commonly prescribed medications for heart disease. Reviews the medication, class of the drug, and side effects. Includes the steps to properly store meds and maintain the prescription regimen.   Cardiac Rehab from 04/26/2017 in [facility] Cardiac and Pulmonary Rehab  Date  04/21/17  Educator  SB Marisue Humble 2]  Instruction Review Code  2- meets goals/outcomes      Go Sex-Intimacy & Heart Disease, Get SMART - Goal Setting: - Group verbal and written instruction through game format to discuss heart disease and the return to sexual intimacy. Provides group verbal and written material to discuss and apply goal setting through the application of the S.M.A.R.T. Method.   Other Matters of the Heart: - Provides group verbal, written materials and models to describe Heart Failure, Angina, Valve Disease, and Diabetes in the realm of heart disease. Includes description of the disease process and treatment options available to the cardiac patient.   Exercise & Equipment Safety: - Individual verbal instruction and demonstration of equipment use and safety with use of the equipment.   Cardiac Rehab from 04/26/2017 in [facility] Cardiac and Pulmonary Rehab  Date  04/19/17  Educator  C. EnterkinRN  Instruction Review Code  1- partially meets, needs review/practice      Infection Prevention: - Provides verbal and written material to individual with discussion of infection control including proper hand washing and proper equipment cleaning during exercise session.   Cardiac Rehab from 04/26/2017 in [facility] Cardiac and Pulmonary Rehab  Date  04/19/17  Educator  C. EnterkinRN  Instruction Review Code  2- meets goals/outcomes      Falls Prevention: - Provides verbal and written material to individual with discussion of falls prevention and safety.   Cardiac Rehab from 04/26/2017 in [facility] Cardiac and Pulmonary Rehab  Date  04/19/17  Educator  C. Bagdad   Instruction Review Code  2- meets goals/outcomes      Diabetes: - Individual verbal and written instruction to review signs/symptoms of diabetes, desired ranges of glucose level fasting, after meals and with exercise. Advice that pre and post exercise glucose checks will be done for 3 sessions at entry of program.    Knowledge Questionnaire Score:     Knowledge Questionnaire Score - 04/19/17 1303      Knowledge Questionnaire Score   Pre Score 19      Core Components/Risk Factors/Patient Goals at Admission:     Personal Goals and Risk Factors at Admission - 04/19/17 1309      Core Components/Risk Factors/Patient Goals on Admission   Personal Goal Other Yes   Personal Goal Learn what to eat after his open heart surgery. Find out his lifting restriction from his MD if it is still 3 lbs or less   Intervention Dietician appt made for Kali. Teresa will see his surgeon this week   Expected Outcomes Heart healthy eating. Healing after his CABG surgery with no complications      Core Components/Risk Factors/Patient Goals Review:    Core Components/Risk Factors/Patient Goals at Discharge (Final Review):    ITP Comments:     ITP Comments    Row Name 04/19/17 1301 04/28/17 0651         ITP Comments ITP created  during Medical Review after Cardiac Rehab informed consent was signed. DX listed in CHL/EPIC 30 day review. Continue with ITP unless directed changes per Medical Director review. New to program         Comments:

## 2017-04-28 NOTE — Progress Notes (Signed)
Daily Session Note  Patient Details  Name: Elaine Middleton MRN: [id number] Date of Birth: 12/19/1945 Referring Provider:     Cardiac Rehab from 04/19/2017 in [facility] Cardiac and Pulmonary Rehab  Referring Provider  [name] MD      Encounter Date: 04/28/2017  Check In:     Session Check In - 04/28/17 0923      Check-In   Location [facility]-Cardiac & Pulmonary Rehab   Staff Present Heath Lark, RN, BSN, CCRP;Jessica Luan Pulling, MA, ACSM RCEP, Exercise Physiologist;Amanda Oletta Darter, BA, ACSM CEP, Exercise Physiologist   Supervising physician immediately available to respond to emergencies See telemetry face sheet for immediately available ER MD   Medication changes reported     No   Fall or balance concerns reported    No   Warm-up and Cool-down Performed on first and last piece of equipment   Resistance Training Performed Yes   VAD Patient? No     Pain Assessment   Currently in Pain? No/denies   Multiple Pain Sites No         History  Smoking Status  . Former Smoker  . Packs/day: 1.50  . Years: 35.00  . Types: Cigarettes  . Quit date: 04/09/2005  Smokeless Tobacco  . Never Used    Goals Met:  Independence with exercise equipment Exercise tolerated well No report of cardiac concerns or symptoms Strength training completed today  Goals Unmet:  Not Applicable  Comments: Pt able to follow exercise prescription today without complaint.  Will continue to monitor for progression. Reviewed home exercise with pt today.  Pt plans to walk and use stationary bike at home for exercise.  Reviewed THR, pulse, RPE, sign and symptoms, and when to call 911 or MD.  Also discussed weather considerations and indoor options.  Pt voiced understanding.     Dr. Emily Filbert is Medical Director for [place] and [name].

## 2017-04-30 ENCOUNTER — Encounter: Payer: 59 | Admitting: *Deleted

## 2017-04-30 DIAGNOSIS — Z951 Presence of aortocoronary bypass graft: Secondary | ICD-10-CM

## 2017-04-30 NOTE — Progress Notes (Signed)
Daily Session Note  Patient Details  Name: Seth Byrd MRN: [id number] Date of Birth: Apr 29, 1946 Referring Provider:     Cardiac Rehab from 04/19/2017 in [facility] Cardiac and Pulmonary Rehab  Referring Provider  [name] MD      Encounter Date: 04/30/2017  Check In:     Session Check In - 04/30/17 0935      Check-In   Location [facility]Cardiac & Pulmonary Rehab   Staff Present Heath Lark, RN, BSN, CCRP;Linford Quintela Luan Pulling, MA, ACSM RCEP, Exercise Physiologist;Joseph Hood RCP,RRT,BSRT;Mary Kellie Shropshire, RN, BSN, [place]   Supervising physician immediately available to respond to emergencies See telemetry face sheet for immediately available ER MD   Medication changes reported     No   Fall or balance concerns reported    No   Warm-up and Cool-down Performed as group-led instruction   Resistance Training Performed Yes   VAD Patient? No     Pain Assessment   Currently in Pain? No/denies   Multiple Pain Sites No         History  Smoking Status  . Former Smoker  . Packs/day: 1.50  . Years: 35.00  . Types: Cigarettes  . Quit date: 04/09/2005  Smokeless Tobacco  . Never Used    Goals Met:  Independence with exercise equipment Exercise tolerated well No report of cardiac concerns or symptoms Strength training completed today  Goals Unmet:  Not Applicable  Comments: Pt able to follow exercise prescription today without complaint.  Will continue to monitor for progression.    Dr. Emily Filbert is Medical Director for [place] and [name].

## 2017-05-03 ENCOUNTER — Telehealth (HOSPITAL_COMMUNITY): Payer: Self-pay | Admitting: Pharmacist

## 2017-05-03 ENCOUNTER — Encounter: Payer: 59 | Admitting: *Deleted

## 2017-05-03 ENCOUNTER — Ambulatory Visit (HOSPITAL_COMMUNITY)
Admission: RE | Admit: 2017-05-03 | Discharge: 2017-05-03 | Disposition: A | Discharge status: Home / Self Care | Payer: 59 | Source: Ambulatory Visit | Attending: Cardiology | Admitting: Cardiology

## 2017-05-03 VITALS — BP 112/66 | HR 80 | Wt 180.0 lb

## 2017-05-03 DIAGNOSIS — M1990 Unspecified osteoarthritis, unspecified site: Secondary | ICD-10-CM | POA: Diagnosis not present

## 2017-05-03 DIAGNOSIS — J449 Chronic obstructive pulmonary disease, unspecified: Secondary | ICD-10-CM | POA: Insufficient documentation

## 2017-05-03 DIAGNOSIS — E785 Hyperlipidemia, unspecified: Secondary | ICD-10-CM | POA: Insufficient documentation

## 2017-05-03 DIAGNOSIS — D696 Thrombocytopenia, unspecified: Secondary | ICD-10-CM | POA: Diagnosis not present

## 2017-05-03 DIAGNOSIS — Z951 Presence of aortocoronary bypass graft: Secondary | ICD-10-CM

## 2017-05-03 DIAGNOSIS — Z8546 Personal history of malignant neoplasm of prostate: Secondary | ICD-10-CM | POA: Diagnosis not present

## 2017-05-03 DIAGNOSIS — Z7982 Long term (current) use of aspirin: Secondary | ICD-10-CM | POA: Insufficient documentation

## 2017-05-03 DIAGNOSIS — I5022 Chronic systolic (congestive) heart failure: Secondary | ICD-10-CM | POA: Diagnosis present

## 2017-05-03 DIAGNOSIS — I110 Hypertensive heart disease with heart failure: Secondary | ICD-10-CM | POA: Diagnosis not present

## 2017-05-03 DIAGNOSIS — I255 Ischemic cardiomyopathy: Secondary | ICD-10-CM | POA: Diagnosis not present

## 2017-05-03 DIAGNOSIS — Z79899 Other long term (current) drug therapy: Secondary | ICD-10-CM | POA: Insufficient documentation

## 2017-05-03 DIAGNOSIS — D649 Anemia, unspecified: Secondary | ICD-10-CM | POA: Diagnosis not present

## 2017-05-03 LAB — BASIC METABOLIC PANEL
ANION GAP: 7 (ref 5–15)
BUN: 23 mg/dL — AB (ref 6–20)
CALCIUM: 9.6 mg/dL (ref 8.9–10.3)
CO2: 23 mmol/L (ref 22–32)
Chloride: 106 mmol/L (ref 101–111)
Creatinine, Ser: 1.06 mg/dL (ref 0.61–1.24)
GFR calc Af Amer: >60 mL/min (ref >60)
GLUCOSE: 108 mg/dL — AB (ref 65–99)
Potassium: 3.8 mmol/L (ref 3.5–5.1)
SODIUM: 136 mmol/L (ref 135–145)

## 2017-05-03 LAB — BRAIN NATRIURETIC PEPTIDE: B NATRIURETIC PEPTIDE 5: 102.4 pg/mL — AB (ref 0.0–100.0)

## 2017-05-03 MED ORDER — CARVEDILOL 12.5 MG PO TABS: 12.5000 mg | ORAL_TABLET | Freq: Two times a day (BID) | ORAL | 3 refills | Status: DC

## 2017-05-03 NOTE — Progress Notes (Signed)
Daily Session Note  Patient Details  Name: Seth Byrd MRN: [id number] Date of Birth: 14-Apr-1946 Referring Provider:     Cardiac Rehab from 04/19/2017 in [facility] Cardiac and Pulmonary Rehab  Referring Provider  [name] MD      Encounter Date: 05/03/2017  Check In:     Session Check In - 05/03/17 0847      Check-In   Location [facility]Cardiac & Pulmonary Rehab   Staff Present Earlean Shawl, BS, ACSM CEP, Exercise Physiologist;Jessica Luan Pulling, MA, ACSM RCEP, Exercise Physiologist;Carroll Enterkin, RN, BSN   Supervising physician immediately available to respond to emergencies See telemetry face sheet for immediately available ER MD   Medication changes reported     No   Fall or balance concerns reported    No   Warm-up and Cool-down Performed on first and last piece of equipment   Resistance Training Performed Yes   VAD Patient? No     Pain Assessment   Currently in Pain? No/denies   Multiple Pain Sites No         History  Smoking Status  . Former Smoker  . Packs/day: 1.50  . Years: 35.00  . Types: Cigarettes  . Quit date: 04/09/2005  Smokeless Tobacco  . Never Used    Goals Met:  Independence with exercise equipment Exercise tolerated well No report of cardiac concerns or symptoms Strength training completed today  Goals Unmet:  Not Applicable  Comments: Pt able to follow exercise prescription today without complaint.  Will continue to monitor for progression.    Dr. Emily Filbert is Medical Director for [place] and [name].

## 2017-05-03 NOTE — Progress Notes (Signed)
HF MD: BENSIMHON  HPI:   Seth Byrd a pleasant 71 y.o.AA malewith history of chronic systolic CHF due to ischemic cardiomyopathy, Echo 02/09/17 LVEF 20-25%, OA, HTN, HLD, h/o prostate cancer, and COPD.   Admitted to [facility] 02/09/17 with chest discomfort, diaphoresis, and SOB. CVTS consulted and pt sent to [facility] for CABG consideration. HF team consulted for optimization. Revascularization decided as priority and pt underwent CABG x 3 02/10/17.  Patient was seen in clinic on 04/19/17 where patient was doing well and had just started cardiac rehab. At that time, losartan was discontinued and Entresto was initiated.   Patient presents today for pharmacist-led heart failure medication titration visit with his daughter, who helps manage his medications. Patient reports doing well and states that he was able to start [facility] without any issues.  Denies any adverse effects. Reports that Cardiac Rehab has been going well and he verbalized understanding of weight restrictions. Patient and daughter endorse heart-healthy diet (cooking with Mrs. Dash only) and adherence to fluid restriction. Denies any SOB, lightheadedness, dizziness or near syncope. No pitting edema on exam.   . Shortness of breath/dyspnea on exertion? no  . Orthopnea/PND? No - sleeps with one pillow out of habit but can lie flat.  . Edema? no . Lightheadedness/dizziness? no . Daily weights at home? Yes - stable ~178 lb . Blood pressure/heart rate monitoring at home? no . Following low-sodium/fluid-restricted diet? yes  HF Medications: Spironolactone 25mg  daily Entresto 24-26 mg BID Carvedilol 6.25mg  BID Lasix 40mg  daily  Has the patient been experiencing any side effects to the medications prescribed?  no  Does the patient have any problems obtaining medications due to transportation or finances?   No - [employer] Pharmacist, community  Understanding of regimen: good Understanding of indications: good Potential of compliance:  good Patient understands to avoid NSAIDs. Patient understands to avoid decongestants.    Pertinent Lab Values: . 05/03/2017: Serum creatinine 1.06, CO2 23, Potassium 3.8, Sodium 136, BNP 102  Vital Signs: . Weight: 180 lbs (dry weight: ~175 lbs) . Blood pressure: 112/66 mmHg . Heart rate: 80 bpm     ReDS Vest - 05/03/17 1500      ReDS Vest   MR  No   Estimated volume prior to reading Med   Fitting Posture Standing   Height Marker Tall   Ruler Value 11.5   Center Strip Aligned   ReDS Value 30   Anatomical Comments normal chest     Assessment: 1. Chronicsystolic CHF (EF 61-44%), due to ICM.  - NYHA class I-IIsymptoms. - Appears euvolemic on exam.  - Increase carvedilol to 12.5 mg BID - Continue entresto 24-26 mg BID, furosemide 40 mg daily and spironolactone 25 mg daily.  - Basic disease state pathophysiology, medication indication, mechanism and side effects reviewed at length with patient and he verbalized understanding - Reinforced fluid restriction to <2 L daily, sodium restriction to less than 2000 mg daily, and the importance of daily weights.  2. 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 HLD - Continue statin and ASA 4. Anemia - Stable CBC per last labs, hgb chronically low 5. 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  Plan: 1) Medication changes: Based on clinical presentation, vital signs and recent labs will increase carvedilol 12.5 mg twice a day.  2) Labs:  BMET and BNP today.  3) Follow-up: [name] in 4 months.   Seen with: Seth Byrd, PharmD, Pharmacy Resident  Seth Byrd, PharmD Candidate   Agree with above.  Seth Byrd, PharmD, BCPS, CPP Clinical Pharmacist Pager: [phone] Phone: [phone] 05/03/2017 2:39 PM

## 2017-05-03 NOTE — Patient Instructions (Addendum)
Thanks for coming to see [place] today! We are impressed with how well you're doing.   INCREASE your carvedilol to 12.5 mg by mouth TWICE DAILY  Labs today, we will call you with any changes.   Otherwise, keep taking your medications as prescribed and eating heart healthy foods.

## 2017-05-03 NOTE — Telephone Encounter (Signed)
Mr. Walther plan does not require a PA for Entresto 24-26 mg BID.

## 2017-05-05 ENCOUNTER — Encounter: Payer: 59 | Admitting: *Deleted

## 2017-05-05 DIAGNOSIS — Z951 Presence of aortocoronary bypass graft: Secondary | ICD-10-CM

## 2017-05-05 NOTE — Progress Notes (Signed)
Daily Session Note  Patient Details  Name: Seth Byrd MRN: [id number] Date of Birth: 1945/11/14 Referring Provider:     Cardiac Rehab from 04/19/2017 in [facility] Cardiac and Pulmonary Rehab  Referring Provider  [name] MD      Encounter Date: 05/05/2017  Check In:     Session Check In - 05/05/17 0855      Check-In   Location [facility]-Cardiac & Pulmonary Rehab   Staff Present Heath Lark, RN, BSN, CCRP;Jessica Luan Pulling, MA, ACSM RCEP, Exercise Physiologist;Krista Frederico Hamman, RN BSN;Manual Navarra Joya Gaskins RN,BSN   Supervising physician immediately available to respond to emergencies See telemetry face sheet for immediately available ER MD   Medication changes reported     No   Fall or balance concerns reported    No   Warm-up and Cool-down Performed on first and last piece of equipment   Resistance Training Performed Yes   VAD Patient? No     Pain Assessment   Currently in Pain? No/denies   Multiple Pain Sites No           Exercise Prescription Changes - 05/04/17 1100      Response to Exercise   Blood Pressure (Admit) 122/66   Blood Pressure (Exercise) 122/62   Blood Pressure (Exit) 120/74   Heart Rate (Admit) 77 bpm   Heart Rate (Exercise) 90 bpm   Heart Rate (Exit) 70 bpm   Perceived Dyspnea (Exercise) 14   Symptoms none   Duration Continue with 45 min of aerobic exercise without signs/symptoms of physical distress.   Intensity THRR unchanged     Progression   Progression Continue to progress workloads to maintain intensity without signs/symptoms of physical distress.   Average METs 2.79     Resistance Training   Training Prescription Yes   Weight 3lbs   Reps 10-15     Interval Training   Interval Training No     Treadmill   MPH 2   Grade 0.5   Minutes 15   METs 2.67     Recumbant Bike   Level 2   Watts 20   Minutes 15   METs 2.79     NuStep   Level 3   Minutes 15   METs 2.9     Home Exercise Plan   Plans to continue exercise at Home (comment)  walk  and stationary bike   Frequency Add 2 additional days to program exercise sessions.   Initial Home Exercises Provided 04/28/17      History  Smoking Status  . Former Smoker  . Packs/day: 1.50  . Years: 35.00  . Types: Cigarettes  . Quit date: 04/09/2005  Smokeless Tobacco  . Never Used    Goals Met:  Independence with exercise equipment Exercise tolerated well No report of cardiac concerns or symptoms Strength training completed today  Goals Unmet:  Not Applicable  Comments: Pt able to follow exercise prescription today without complaint.  Will continue to monitor for progression.    Dr. Emily Filbert is Medical Director for [place] and [name].

## 2017-05-07 ENCOUNTER — Encounter: Payer: 59 | Admitting: *Deleted

## 2017-05-07 DIAGNOSIS — Z951 Presence of aortocoronary bypass graft: Secondary | ICD-10-CM | POA: Diagnosis not present

## 2017-05-07 NOTE — Progress Notes (Signed)
Daily Session Note  Patient Details  Name: Seth Byrd MRN: [id number] Date of Birth: 1946-06-20 Referring Provider:     Cardiac Rehab from 04/19/2017 in [facility] Cardiac and Pulmonary Rehab  Referring Provider  [name] MD      Encounter Date: 05/07/2017  Check In:     Session Check In - 05/07/17 0833      Check-In   Location [facility]-Cardiac & Pulmonary Rehab   Staff Present Heath Lark, RN, BSN, CCRP;Jessica Luan Pulling, MA, ACSM RCEP, Exercise Physiologist;Kikue Gerhart, RN, BSN   Supervising physician immediately available to respond to emergencies See telemetry face sheet for immediately available ER MD   Medication changes reported     No   Fall or balance concerns reported    No   Tobacco Cessation No Change   Warm-up and Cool-down Performed on first and last piece of equipment   Resistance Training Performed Yes   VAD Patient? No     Pain Assessment   Currently in Pain? No/denies         History  Smoking Status  . Former Smoker  . Packs/day: 1.50  . Years: 35.00  . Types: Cigarettes  . Quit date: 04/09/2005  Smokeless Tobacco  . Never Used    Goals Met:  Proper associated with RPD/PD & O2 Sat Exercise tolerated well No report of cardiac concerns or symptoms Strength training completed today  Goals Unmet:  Not Applicable  Comments:     Dr. Emily Filbert is Medical Director for [name] and [name].

## 2017-05-10 ENCOUNTER — Encounter: Payer: 59 | Attending: Internal Medicine | Admitting: *Deleted

## 2017-05-10 DIAGNOSIS — Z951 Presence of aortocoronary bypass graft: Secondary | ICD-10-CM

## 2017-05-10 NOTE — Progress Notes (Signed)
Daily Session Note  Patient Details  Name: Seth Byrd MRN: [id number] Date of Birth: 30-Oct-1946 Referring Provider:     Cardiac Rehab from 04/19/2017 in [facility] Cardiac and Pulmonary Rehab  Referring Provider  [name] MD      Encounter Date: 05/10/2017  Check In:     Session Check In - 05/10/17 0843      Check-In   Location [facility]-Cardiac & Pulmonary Rehab   Staff Present Earlean Shawl, BS, ACSM CEP, Exercise Physiologist;Carroll Enterkin, RN, Levie Heritage, MA, ACSM RCEP, Exercise Physiologist   Supervising physician immediately available to respond to emergencies See telemetry face sheet for immediately available ER MD   Medication changes reported     No   Fall or balance concerns reported    No   Warm-up and Cool-down Performed on first and last piece of equipment   Resistance Training Performed Yes   VAD Patient? No     Pain Assessment   Currently in Pain? No/denies   Multiple Pain Sites No         History  Smoking Status  . Former Smoker  . Packs/day: 1.50  . Years: 35.00  . Types: Cigarettes  . Quit date: 04/09/2005  Smokeless Tobacco  . Never Used    Goals Met:  Independence with exercise equipment Exercise tolerated well No report of cardiac concerns or symptoms Strength training completed today  Goals Unmet:  Not Applicable  Comments: Pt able to follow exercise prescription today without complaint.  Will continue to monitor for progression.    Dr. Emily Filbert is Medical Director for [place] and [name].

## 2017-05-14 ENCOUNTER — Encounter: Payer: 59 | Admitting: *Deleted

## 2017-05-14 DIAGNOSIS — Z951 Presence of aortocoronary bypass graft: Secondary | ICD-10-CM

## 2017-05-14 NOTE — Progress Notes (Signed)
Daily Session Note  Patient Details  Name: Osiel Stick MRN: [id number] Date of Birth: [birth year] Referring Provider:     Cardiac Rehab from 04/19/2017 in [facility] Cardiac and Pulmonary Rehab  Referring Provider  [name] MD      Encounter Date: 05/14/2017  Check In:     Session Check In - 05/14/17 0837      Check-In   Location [facility]-Cardiac & Pulmonary Rehab   Staff Present Gerlene Burdock, RN, BSN;Jessica Luan Pulling, MA, ACSM RCEP, Exercise Physiologist;Amanda Oletta Darter, BA, ACSM CEP, Exercise Physiologist   Supervising physician immediately available to respond to emergencies See telemetry face sheet for immediately available ER MD   Medication changes reported     No   Fall or balance concerns reported    No   Warm-up and Cool-down Performed on first and last piece of equipment   Resistance Training Performed Yes   VAD Patient? No     Pain Assessment   Currently in Pain? No/denies         History  Smoking Status  . Former Smoker  . Packs/day: 1.50  . Years: 35.00  . Types: Cigarettes  . Quit date: 04/09/2005  Smokeless Tobacco  . Never Used    Goals Met:  Independence with exercise equipment Exercise tolerated well No report of cardiac concerns or symptoms Strength training completed today  Goals Unmet:  Not Applicable  Comments: Pt able to follow exercise prescription today without complaint.  Will continue to monitor for progression.    Dr. Emily Filbert is Medical Director for [facility] [place] and [name].

## 2017-05-17 ENCOUNTER — Encounter: Payer: 59 | Admitting: *Deleted

## 2017-05-17 DIAGNOSIS — Z951 Presence of aortocoronary bypass graft: Secondary | ICD-10-CM | POA: Diagnosis not present

## 2017-05-17 NOTE — Progress Notes (Signed)
Daily Session Note  Patient Details  Name: Seth Byrd MRN: [id number] Date of Birth: Mar 15, 1946 Referring Provider:     Cardiac Rehab from 04/19/2017 in [facility] Cardiac and Pulmonary Rehab  Referring Provider  [name] MD      Encounter Date: 05/17/2017  Check In:     Session Check In - 05/17/17 0850      Check-In   Location [facility]-Cardiac & Pulmonary Rehab   Staff Present Gerlene Burdock, RN, Moises Blood, BS, ACSM CEP, Exercise Physiologist;Krista Frederico Hamman, RN BSN   Supervising physician immediately available to respond to emergencies See telemetry face sheet for immediately available ER MD   Medication changes reported     No   Fall or balance concerns reported    No   Tobacco Cessation No Change   Warm-up and Cool-down Performed on first and last piece of equipment   Resistance Training Performed Yes   VAD Patient? No     Pain Assessment   Currently in Pain? No/denies   Multiple Pain Sites No         History  Smoking Status  . Former Smoker  . Packs/day: 1.50  . Years: 35.00  . Types: Cigarettes  . Quit date: 04/09/2005  Smokeless Tobacco  . Never Used    Goals Met:  Independence with exercise equipment Exercise tolerated well No report of cardiac concerns or symptoms Strength training completed today  Goals Unmet:  Not Applicable  Comments: Pt able to follow exercise prescription today without complaint.  Will continue to monitor for progression.    Dr. Emily Filbert is Medical Director for [place] and [name].

## 2017-05-19 DIAGNOSIS — Z951 Presence of aortocoronary bypass graft: Secondary | ICD-10-CM

## 2017-05-19 NOTE — Progress Notes (Signed)
Daily Session Note  Patient Details  Name: Seth Byrd MRN: [id number] Date of Birth: 11/21/1945 Referring Provider:     Cardiac Rehab from 04/19/2017 in [facility] Cardiac and Pulmonary Rehab  Referring Provider  [name] MD      Encounter Date: 05/19/2017  Check In:     Session Check In - 05/19/17 0811      Check-In   Location [facility]-Cardiac & Pulmonary Rehab   Staff Present Nyoka Cowden, RN, BSN, MA;Susanne Bice, RN, BSN, CCRP;Amandy Chubbuck Flavia Shipper   Supervising physician immediately available to respond to emergencies See telemetry face sheet for immediately available ER MD   Medication changes reported     No   Fall or balance concerns reported    No   Tobacco Cessation No Change   Warm-up and Cool-down Performed on first and last piece of equipment   Resistance Training Performed Yes   VAD Patient? No     Pain Assessment   Currently in Pain? No/denies   Multiple Pain Sites No           Exercise Prescription Changes - 05/18/17 1500      Response to Exercise   Blood Pressure (Admit) 130/70   Blood Pressure (Exercise) 122/60   Blood Pressure (Exit) 124/62   Heart Rate (Admit) 73 bpm   Heart Rate (Exercise) 82 bpm   Heart Rate (Exit) 72 bpm   Perceived Dyspnea (Exercise) 15   Symptoms none   Duration Continue with 45 min of aerobic exercise without signs/symptoms of physical distress.   Intensity THRR unchanged     Progression   Progression Continue to progress workloads to maintain intensity without signs/symptoms of physical distress.   Average METs 2.94     Resistance Training   Training Prescription Yes   Weight 3 lb   Reps 10-15     Interval Training   Interval Training No     Recumbant Bike   Level 4   Watts 20   Minutes 15   METs 2.78     NuStep   Level 3   Minutes 15   METs 3.1      History  Smoking Status  . Former Smoker  . Packs/day: 1.50  . Years: 35.00  . Types: Cigarettes  . Quit date: 04/09/2005  Smokeless Tobacco   . Never Used    Goals Met:  Independence with exercise equipment Exercise tolerated well No report of cardiac concerns or symptoms Strength training completed today  Goals Unmet:  Not Applicable  Comments: Pt able to follow exercise prescription today without complaint.  Will continue to monitor for progression.   Dr. Emily Filbert is Medical Director for [place] and [name].

## 2017-05-21 ENCOUNTER — Encounter: Payer: 59 | Admitting: *Deleted

## 2017-05-21 DIAGNOSIS — Z951 Presence of aortocoronary bypass graft: Secondary | ICD-10-CM | POA: Diagnosis not present

## 2017-05-21 NOTE — Progress Notes (Signed)
Daily Session Note  Patient Details  Name: Seth Byrd MRN: [id number] Date of Birth: 07/27/1946 Referring Provider:     Cardiac Rehab from 04/19/2017 in [facility] Cardiac and Pulmonary Rehab  Referring Provider  Fath, Kenneth MD      Encounter Date: 05/21/2017  Check In:     Session Check In - 05/21/17 0851      Check-In   Location [facility]-Cardiac & Pulmonary Rehab   Staff Present Mary Jo Abernethy, RN, BSN, MA;Susanne Bice, RN, BSN, CCRP;Amanda Sommer, BA, ACSM CEP, Exercise Physiologist   Supervising physician immediately available to respond to emergencies See telemetry face sheet for immediately available ER MD   Medication changes reported     No   Fall or balance concerns reported    No   Warm-up and Cool-down Performed on first and last piece of equipment   Resistance Training Performed Yes   VAD Patient? No     Pain Assessment   Currently in Pain? No/denies         History  Smoking Status  . Former Smoker  . Packs/day: 1.50  . Years: 35.00  . Types: Cigarettes  . Quit date: 04/09/2005  Smokeless Tobacco  . Never Used    Goals Met:  Independence with exercise equipment Exercise tolerated well No report of cardiac concerns or symptoms Strength training completed today  Goals Unmet:  Not Applicable  Comments: Doing well with exercise prescription progression.    Dr. Mark Miller is Medical Director for [facility] and [name]. 

## 2017-05-24 ENCOUNTER — Encounter: Payer: 59 | Admitting: *Deleted

## 2017-05-24 DIAGNOSIS — Z951 Presence of aortocoronary bypass graft: Secondary | ICD-10-CM

## 2017-05-24 NOTE — Progress Notes (Signed)
Daily Session Note  Patient Details  Name: Carsten Carstarphen MRN: [id number] Date of Birth: [birth year] Referring Provider:     Cardiac Rehab from 04/19/2017 in [facility] Cardiac and Pulmonary Rehab  Referring Provider  [name] MD      Encounter Date: 05/24/2017  Check In:     Session Check In - 05/24/17 0745      Check-In   Location [facility]Cardiac & Pulmonary Rehab   Staff Present Gerlene Burdock, RN, Levie Heritage, MA, ACSM RCEP, Exercise Physiologist;Kelly Amedeo Plenty, BS, ACSM CEP, Exercise Physiologist   Supervising physician immediately available to respond to emergencies See telemetry face sheet for immediately available ER MD   Medication changes reported     No   Fall or balance concerns reported    No   Warm-up and Cool-down Performed on first and last piece of equipment   Resistance Training Performed Yes   VAD Patient? No     Pain Assessment   Currently in Pain? No/denies   Multiple Pain Sites No         History  Smoking Status  . Former Smoker  . Packs/day: 1.50  . Years: 35.00  . Types: Cigarettes  . Quit date: 04/09/2005  Smokeless Tobacco  . Never Used    Goals Met:  Independence with exercise equipment Exercise tolerated well No report of cardiac concerns or symptoms Strength training completed today  Goals Unmet:  Not Applicable  Comments: Pt able to follow exercise prescription today without complaint.  Will continue to monitor for progression.    Dr. Emily Filbert is Medical Director for [place] and [name].

## 2017-05-26 ENCOUNTER — Encounter: Payer: Self-pay | Admitting: *Deleted

## 2017-05-26 DIAGNOSIS — Z951 Presence of aortocoronary bypass graft: Secondary | ICD-10-CM

## 2017-05-26 NOTE — Progress Notes (Signed)
Daily Session Note  Patient Details  Name: Seth Byrd MRN: [id number] Date of Birth: 1946/05/02 Referring Provider:     Cardiac Rehab from 04/19/2017 in [facility]  Referring Provider  [name] MD      Encounter Date: 05/26/2017  Check In:     Session Check In - 05/26/17 0759      Check-In   Location [facility]   Staff Present Alberteen Sam, MA, ACSM RCEP, Exercise Physiologist;Joseph Christain [place], RN BSN   Supervising physician immediately available to respond to emergencies See telemetry face sheet for immediately available ER MD   Medication changes reported     No   Fall or balance concerns reported    No   Tobacco Cessation No Change   Warm-up and Cool-down Performed on first and last piece of equipment   Resistance Training Performed Yes   VAD Patient? No     Pain Assessment   Currently in Pain? No/denies   Multiple Pain Sites No         History  Smoking Status  . Former Smoker  . Packs/day: 1.50  . Years: 35.00  . Types: Cigarettes  . Quit date: 04/09/2005  Smokeless Tobacco  . Never Used    Goals Met:  Independence with exercise equipment Exercise tolerated well No report of cardiac concerns or symptoms Strength training completed today  Goals Unmet:  Not Applicable  Comments: Pt able to follow exercise prescription today without complaint.  Will continue to monitor for progression.   Dr. Emily Filbert is Medical Director for [place] and [name].

## 2017-05-26 NOTE — Progress Notes (Signed)
Cardiac Individual Treatment Plan  Patient Details  Name: Seth Byrd MRN: [id number] Date of Birth: November 30, 1945 Referring Provider:     Cardiac Rehab from 04/19/2017 in [facility] Cardiac and Pulmonary Rehab  Referring Provider  [name] MD      Initial Encounter Date:    Cardiac Rehab from 04/19/2017 in [facility] Cardiac and Pulmonary Rehab  Date  04/19/17  Referring Provider  [name] MD      Visit Diagnosis: S/P CABG x 3  Patient's Home Medications on Admission:  Current Outpatient Prescriptions:  .  acetaminophen (TYLENOL) 500 MG tablet, Take 500 mg by mouth every 6 (six) hours as needed., Disp: , Rfl:  .  aspirin EC 325 MG EC tablet, Take 1 tablet (325 mg total) by mouth daily., Disp: 30 tablet, Rfl: 0 .  atorvastatin (LIPITOR) 40 MG tablet, Take 1 tablet (40 mg total) by mouth daily at 6 PM., Disp: , Rfl:  .  carvedilol (COREG) 12.5 MG tablet, Take 1 tablet (12.5 mg total) by mouth 2 (two) times daily with a meal., Disp: 60 tablet, Rfl: 3 .  Emollient (CERAVE) CREA, Apply topically., Disp: , Rfl:  .  etanercept (ENBREL) 50 MG/ML injection, Inject 50 mg into the skin once a week. Saturday, Disp: , Rfl:  .  furosemide (LASIX) 40 MG tablet, Take 1 tablet (40 mg total) by mouth daily., Disp: 30 tablet, Rfl:  .  Multiple Vitamins-Minerals (ONE-A-DAY MENS 50+ ADVANTAGE PO), Take 1 tablet by mouth daily., Disp: , Rfl:  .  Omega-3 Fatty Acids (FISH OIL) 1200 MG CAPS, Take 1 capsule by mouth 2 (two) times daily. , Disp: , Rfl:  .  sacubitril-valsartan (ENTRESTO) 24-26 MG, Take 1 tablet by mouth 2 (two) times daily., Disp: 60 tablet, Rfl: 11 .  spironolactone (ALDACTONE) 25 MG tablet, Take 1 tablet (25 mg total) by mouth daily., Disp: , Rfl:  .  vitamin B-12 (CYANOCOBALAMIN) 1000 MCG tablet, Take 1,000 mcg by mouth daily. , Disp: , Rfl:   Past Medical History: Past Medical History:  Diagnosis Date  . Arthritis   . Hypertension     Tobacco Use: History  Smoking Status  . Former  Smoker  . Packs/day: 1.50  . Years: 35.00  . Types: Cigarettes  . Quit date: 04/09/2005  Smokeless Tobacco  . Never Used    Labs: Recent Review Flowsheet Data    Labs for ITP Cardiac and Pulmonary Rehab Latest Ref Rng & Units 02/10/2017 02/11/2017 02/11/2017 02/11/2017 02/12/2017   Cholestrol 100 - 199 mg/dL - - - - -   LDLCALC 0 - 99 mg/dL - - - - -   HDL >39 mg/dL - - - - -   Trlycerides 0 - 149 mg/dL - - - - -   Hemoglobin A1c 4.8 - 5.6 % - - - - -   PHART 7.350 - 7.450 7.391 7.372 - - -   PCO2ART 32.0 - 48.0 mmHg 36.8 37.3 - - -   HCO3 20.0 - 28.0 mmol/L 22.3 21.6 - - -   TCO2 0 - 100 mmol/L 23 23 - 24 -   ACIDBASEDEF 0.0 - 2.0 mmol/L 2.0 3.0(H) - - -   O2SAT % 93.0 93.0 63.0 - 56.8       Exercise Target Goals:    Exercise Program Goal: Individual exercise prescription set with THRR, safety & activity barriers. Participant demonstrates ability to understand and report RPE using BORG scale, to self-measure pulse accurately, and to acknowledge the importance of  the exercise prescription.  Exercise Prescription Goal: Starting with aerobic activity 30 plus minutes a day, 3 days per week for initial exercise prescription. Provide home exercise prescription and guidelines that participant acknowledges understanding prior to discharge.  Activity Barriers & Risk Stratification:     Activity Barriers & Cardiac Risk Stratification - 04/19/17 1307      Activity Barriers & Cardiac Risk Stratification   Activity Barriers Arthritis;Joint Problems;Left Knee Replacement;Deconditioning  athritis in hands and wrist   Cardiac Risk Stratification High      6 Minute Walk:     6 Minute Walk    Row Name 04/19/17 1506         6 Minute Walk   Phase Initial     Distance 1112 feet     Walk Time 6 minutes     # of Rest Breaks 0     MPH 2.11     METS 2.78     RPE 16     VO2 Peak 9.74     Symptoms No     Resting HR 77 bpm     Resting BP 126/60     Max Ex. HR 96 bpm     Max Ex. BP  136/56     2 Minute Post BP 130/60        Oxygen Initial Assessment:   Oxygen Re-Evaluation:   Oxygen Discharge (Final Oxygen Re-Evaluation):   Initial Exercise Prescription:     Initial Exercise Prescription - 04/19/17 1500      Date of Initial Exercise RX and Referring Provider   Date 04/19/17   Referring Provider [name] MD     Treadmill   MPH 2   Grade 0.5   Minutes 15   METs 2.67     Recumbant Bike   Level 2   RPM 50   Watts 20   Minutes 15   METs 2.7     NuStep   Level 1   SPM 80   Minutes 15   METs 2     Prescription Details   Frequency (times per week) 3   Duration Progress to 45 minutes of aerobic exercise without signs/symptoms of physical distress     Intensity   THRR 40-80% of Max Heartrate 106-135   Ratings of Perceived Exertion 11-13   Perceived Dyspnea 0-4     Progression   Progression Continue to progress workloads to maintain intensity without signs/symptoms of physical distress.     Resistance Training   Training Prescription Yes   Weight 3lbs   Reps 10-15      Perform Capillary Blood Glucose checks as needed.  Exercise Prescription Changes:     Exercise Prescription Changes    Row Name 04/19/17 1500 04/21/17 1500 04/28/17 0900 05/04/17 1100 05/18/17 1500     Response to Exercise   Blood Pressure (Admit) 126/60 110/56  - 122/66 130/70   Blood Pressure (Exercise) 136/56 136/70  - 122/62 122/60   Blood Pressure (Exit) 130/60 110/68  - 120/74 124/62   Heart Rate (Admit) 77 bpm 81 bpm  - 77 bpm 73 bpm   Heart Rate (Exercise) 96 bpm 111 bpm  - 90 bpm 82 bpm   Heart Rate (Exit) 82 bpm 83 bpm  - 70 bpm 72 bpm   Oxygen Saturation (Admit) 96 %  -  -  -  -   Oxygen Saturation (Exercise) 99 %  -  -  -  -   Perceived Dyspnea (Exercise) 16  12  - 14 15   Symptoms none none  - none none   Comments walk test result  -  -  -  -   Duration  - Continue with 45 min of aerobic exercise without signs/symptoms of physical distress.  Continue with 45 min of aerobic exercise without signs/symptoms of physical distress. Continue with 45 min of aerobic exercise without signs/symptoms of physical distress. Continue with 45 min of aerobic exercise without signs/symptoms of physical distress.   Intensity  - THRR unchanged THRR unchanged THRR unchanged THRR unchanged     Progression   Progression  - Continue to progress workloads to maintain intensity without signs/symptoms of physical distress. Continue to progress workloads to maintain intensity without signs/symptoms of physical distress. Continue to progress workloads to maintain intensity without signs/symptoms of physical distress. Continue to progress workloads to maintain intensity without signs/symptoms of physical distress.   Average METs  - 2.73 2.73 2.79 2.94     Resistance Training   Training Prescription  - Yes Yes Yes Yes   Weight  - 3lbs 3lbs 3lbs 3 lb   Reps  - 10-15 10-15 10-15 10-15     Interval Training   Interval Training  - No No No No     Treadmill   MPH  - 2 2 2   -   Grade  - 0.5 0.5 0.5  -   Minutes  - 15 15 15   -   METs  - 2.67 2.67 2.67  -     Recumbant Bike   Level  - 2 2 2 4    Watts  - 20 20 20 20    Minutes  - 15 15 15 15    METs  - 2.79 2.79 2.79 2.78     NuStep   Level  -  -  - 3 3   Minutes  -  -  - 15 15   METs  -  -  - 2.9 3.1     Home Exercise Plan   Plans to continue exercise at  -  - Home (comment)  walk and stationary bike Home (comment)  walk and stationary bike  -   Frequency  -  - Add 2 additional days to program exercise sessions. Add 2 additional days to program exercise sessions.  -   Initial Home Exercises Provided  -  - 04/28/17 04/28/17  -      Exercise Comments:     Exercise Comments    Row Name 04/21/17 0950 05/05/17 0755         Exercise Comments First full day of exercise!  Patient was oriented to gym and equipment including functions, settings, policies, and procedures.  Patient's individual exercise  prescription and treatment plan were reviewed.  All starting workloads were established based on the results of the 6 minute walk test done at initial orientation visit.  The plan for exercise progression was also introduced and progression will be customized based on patient's performance and goals. Reviewed METs average and discussed progression with pt today.         Exercise Goals and Review:     Exercise Goals    Row Name 04/19/17 1518             Exercise Goals   Increase Physical Activity Yes       Intervention Provide advice, education, support and counseling about physical activity/exercise needs.;Develop an individualized exercise prescription for aerobic and resistive training based on initial evaluation findings,  risk stratification, comorbidities and participant's personal goals.       Expected Outcomes Achievement of increased cardiorespiratory fitness and enhanced flexibility, muscular endurance and strength shown through measurements of functional capacity and personal statement of participant.       Increase Strength and Stamina Yes       Intervention Provide advice, education, support and counseling about physical activity/exercise needs.;Develop an individualized exercise prescription for aerobic and resistive training based on initial evaluation findings, risk stratification, comorbidities and participant's personal goals.       Expected Outcomes Achievement of increased cardiorespiratory fitness and enhanced flexibility, muscular endurance and strength shown through measurements of functional capacity and personal statement of participant.          Exercise Goals Re-Evaluation :     Exercise Goals Re-Evaluation    Row Name 04/21/17 1550 04/28/17 0925 05/04/17 1144 05/18/17 1529       Exercise Goal Re-Evaluation   Exercise Goals Review Increase Physical Activity;Increase Strenth and Stamina Increase Physical Activity;Increase Strenth and Stamina Increase Physical  Activity;Increase Strenth and Stamina Increase Physical Activity;Increase Strenth and Stamina    Comments Sean has completed is first day of exercise.  We will continue to monitor his progression. Reviewed home exercise with pt today.  Pt plans to walk and use stationary bike at home for exercise.  Reviewed THR, pulse, RPE, sign and symptoms, and when to call 911 or MD.  Also discussed weather considerations and indoor options.  Pt voiced understanding. Ervie continues to do well in rehab.  He is now up to 20 Watts on the bike.  We will continue to monitor his progression. Stephanie continues to progress with exercise. Staff will monitor RPE and adjust as needed.     Expected Outcomes Continue to come to classes to work on increasing activity and improving strength and stamina. Short: Add in at least one day of exercise at home. Long: Make exercise part of routine. Short: Add more incline to treadmill.  Long: Continue to exercise at home. Short - Pt will regularly attend HT.  Long - Pt will continue to build overall strength and stamina.       Discharge Exercise Prescription (Final Exercise Prescription Changes):     Exercise Prescription Changes - 05/18/17 1500      Response to Exercise   Blood Pressure (Admit) 130/70   Blood Pressure (Exercise) 122/60   Blood Pressure (Exit) 124/62   Heart Rate (Admit) 73 bpm   Heart Rate (Exercise) 82 bpm   Heart Rate (Exit) 72 bpm   Perceived Dyspnea (Exercise) 15   Symptoms none   Duration Continue with 45 min of aerobic exercise without signs/symptoms of physical distress.   Intensity THRR unchanged     Progression   Progression Continue to progress workloads to maintain intensity without signs/symptoms of physical distress.   Average METs 2.94     Resistance Training   Training Prescription Yes   Weight 3 lb   Reps 10-15     Interval Training   Interval Training No     Recumbant Bike   Level 4   Watts 20   Minutes 15   METs 2.78      NuStep   Level 3   Minutes 15   METs 3.1      Nutrition:  Target Goals: Understanding of nutrition guidelines, daily intake of sodium 1500mg , cholesterol 200mg , calories 30% from fat and 7% or less from saturated fats, daily to have 5 or more servings  of fruits and vegetables.  Biometrics:     Pre Biometrics - 04/19/17 1519      Pre Biometrics   Height 5' 10.5" (1.791 m)   Weight 176 lb 12.8 oz (80.2 kg)   Waist Circumference 34 inches   Hip Circumference 35.25 inches   Waist to Hip Ratio 0.96 %   BMI (Calculated) 25.1   Single Leg Stand 1.12 seconds       Nutrition Therapy Plan and Nutrition Goals:     Nutrition Therapy & Goals - 05/24/17 1148      Nutrition Therapy   Diet Instructed patient on heart healthy dietary guidelines based on 2000 calorie diet.   Drug/Food Interactions Statins/Certain Fruits   Protein (specify units) 8   Fiber 20 grams   Whole Grain Foods 3 servings   Saturated Fats 14 max. grams   Fruits and Vegetables 5 servings/day  8-10 ideal   Sodium 2000 grams  less than 1500 ideal     Personal Nutrition Goals   Nutrition Goal To continue to read food labels for saturated fat, trans fat and sodium   Personal Goal #2 To include a protein food with most meals in order to meet goal of 8 oz protein food daily.   Comments Patient has made significant positive diet changes since his CABG surgery and has great support from his daughters regarding his diet and food preparation.     Intervention Plan   Intervention Prescribe, educate and counsel regarding individualized specific dietary modifications aiming towards targeted core components such as weight, hypertension, lipid management, diabetes, heart failure and other comorbidities.;Nutrition handout(s) given to patient.   Expected Outcomes Short Term Goal: Understand basic principles of dietary content, such as calories, fat, sodium, cholesterol and nutrients.;Short Term Goal: A plan has been developed  with personal nutrition goals set during dietitian appointment.;Long Term Goal: Adherence to prescribed nutrition plan.      Nutrition Discharge: Rate Your Plate Scores:     Nutrition Assessments - 04/19/17 1305      MEDFICTS Scores   Pre Score 12      Nutrition Goals Re-Evaluation:     Nutrition Goals Re-Evaluation    Row Name 05/14/17 0729             Goals   Comment Mickey has an appt with the registered dietician this Monday. He said his youngest daughter is cooking for him but can't probably go to the dietician appt since she has taken alot of time off work. She is cooking healthy for me.           Nutrition Goals Discharge (Final Nutrition Goals Re-Evaluation):     Nutrition Goals Re-Evaluation - 05/14/17 0729      Goals   Comment Jebediah has an appt with the registered dietician this Monday. He said his youngest daughter is cooking for him but can't probably go to the dietician appt since she has taken alot of time off work. She is cooking healthy for me.       Psychosocial: Target Goals: Acknowledge presence or absence of significant depression and/or stress, maximize coping skills, provide positive support system. Participant is able to verbalize types and ability to use techniques and skills needed for reducing stress and depression.   Initial Review & Psychosocial Screening:     Initial Psych Review & Screening - 04/19/17 1311      Family Dynamics   Good Support System? Yes     Barriers   Psychosocial barriers to  participate in program There are no identifiable barriers or psychosocial needs.     Screening Interventions   Interventions Encouraged to exercise      Quality of Life Scores:      Quality of Life - 04/19/17 1300      Quality of Life Scores   Health/Function Pre 27.23 %   Socioeconomic Pre 26.57 %   Psych/Spiritual Pre 29.14 %   Family Pre 29.38 %   GLOBAL Pre 27.79 %      PHQ-9: Recent Review Flowsheet Data    Depression  screen [facility] 2/9 04/19/2017 03/16/2017 08/07/2016 05/22/2016 11/25/2015   Decreased Interest 0 0 0 0 0   Down, Depressed, Hopeless 0 0 0 0 0   PHQ - 2 Score 0 0 0 0 0   Altered sleeping 0 - - - -   Tired, decreased energy 0 - - - -   Change in appetite 0 - - - -   Feeling bad or failure about yourself  0 - - - -   Trouble concentrating 0 - - - -   Moving slowly or fidgety/restless 0 - - - -   Suicidal thoughts 0 - - - -   PHQ-9 Score 0 - - - -     Interpretation of Total Score  Total Score Depression Severity:  1-4 = Minimal depression, 5-9 = Mild depression, 10-14 = Moderate depression, 15-19 = Moderately severe depression, 20-27 = Severe depression   Psychosocial Evaluation and Intervention:     Psychosocial Evaluation - 05/14/17 0739      Psychosocial Evaluation & Interventions   Comments Sirr's oldest daughter had surgery at [facility] yesterday to check a nodule. So he said he was at the hospital yesterday with her. His youngest daughter cooks for him.       Psychosocial Re-Evaluation:     Psychosocial Re-Evaluation    Row Name 05/14/17 0727             Psychosocial Re-Evaluation   Comments Jevonte said his granddaughter and daughter are watching over him "like a hawk". He has someone helping him clean too. "My family is doing ok with all of this. No sleep problems.        Interventions Encouraged to attend Cardiac Rehabilitation for the exercise       Continue Psychosocial Services  Follow up required by staff          Psychosocial Discharge (Final Psychosocial Re-Evaluation):     Psychosocial Re-Evaluation - 05/14/17 0727      Psychosocial Re-Evaluation   Comments Thaddaeus said his granddaughter and daughter are watching over him "like a hawk". He has someone helping him clean too. "My family is doing ok with all of this. No sleep problems.    Interventions Encouraged to attend Cardiac Rehabilitation for the exercise   Continue Psychosocial Services  Follow up required by  staff      Vocational Rehabilitation: Provide vocational rehab assistance to qualifying candidates.   Vocational Rehab Evaluation & Intervention:     Vocational Rehab - 04/19/17 1133      Initial Vocational Rehab Evaluation & Intervention   Assessment shows need for Vocational Rehabilitation No      Education: Education Goals: Education classes will be provided on a weekly basis, covering required topics. Participant will state understanding/return demonstration of topics presented.  Learning Barriers/Preferences:     Learning Barriers/Preferences - 04/19/17 1306      Learning Barriers/Preferences   Learning Barriers Reading  Learning Preferences Verbal Instruction;Skilled Demonstration      Education Topics: General Nutrition Guidelines/Fats and Fiber: -Group instruction provided by verbal, written material, models and posters to present the general guidelines for heart healthy nutrition. Gives an explanation and review of dietary fats and fiber.   Cardiac Rehab from 05/24/2017 in [facility] Cardiac and Pulmonary Rehab  Date  05/03/17  Educator  CR  Instruction Review Code  2- meets goals/outcomes      Controlling Sodium/Reading Food Labels: -Group verbal and written material supporting the discussion of sodium use in heart healthy nutrition. Review and explanation with models, verbal and written materials for utilization of the food label.   Cardiac Rehab from 05/24/2017 in [facility] Cardiac and Pulmonary Rehab  Date  05/10/17  Educator  CR  Instruction Review Code  2- meets goals/outcomes      Exercise Physiology & Risk Factors: - Group verbal and written instruction with models to review the exercise physiology of the cardiovascular system and associated critical values. Details cardiovascular disease risk factors and the goals associated with each risk factor.   Cardiac Rehab from 05/24/2017 in [facility] Cardiac and Pulmonary Rehab  Date  05/17/17  Educator  [facility]  Instruction  Review Code  2- meets goals/outcomes      Aerobic Exercise & Resistance Training: - Gives group verbal and written discussion on the health impact of inactivity. On the components of aerobic and resistive training programs and the benefits of this training and how to safely progress through these programs.   Cardiac Rehab from 05/24/2017 in [facility] Cardiac and Pulmonary Rehab  Date  05/19/17  Educator  SB  Instruction Review Code  2- meets goals/outcomes      Flexibility, Balance, General Exercise Guidelines: - Provides group verbal and written instruction on the benefits of flexibility and balance training programs. Provides general exercise guidelines with specific guidelines to those with heart or lung disease. Demonstration and skill practice provided.   Cardiac Rehab from 05/24/2017 in [facility] Cardiac and Pulmonary Rehab  Date  05/24/17  Educator  [facility]  Instruction Review Code  2- meets goals/outcomes      Stress Management: - Provides group verbal and written instruction about the health risks of elevated stress, cause of high stress, and healthy ways to reduce stress.   Depression: - Provides group verbal and written instruction on the correlation between heart/lung disease and depressed mood, treatment options, and the stigmas associated with seeking treatment.   Cardiac Rehab from 05/24/2017 in [facility] Cardiac and Pulmonary Rehab  Date  05/05/17  Educator  [facility]  Instruction Review Code  2- meets goals/outcomes      Anatomy & Physiology of the Heart: - Group verbal and written instruction and models provide basic cardiac anatomy and physiology, with the coronary electrical and arterial systems. Review of: AMI, Angina, Valve disease, Heart Failure, Cardiac Arrhythmia, Pacemakers, and the ICD.   Cardiac Procedures: - Group verbal and written instruction and models to describe the testing methods done to diagnose heart disease. Reviews the outcomes of the test results. Describes the  treatment choices: Medical Management, Angioplasty, or Coronary Bypass Surgery.   Cardiac Medications: - Group verbal and written instruction to review commonly prescribed medications for heart disease. Reviews the medication, class of the drug, and side effects. Includes the steps to properly store meds and maintain the prescription regimen.   Cardiac Rehab from 05/24/2017 in [facility] Cardiac and Pulmonary Rehab  Date  04/21/17  Educator  Isac Sarna Marisue Humble 2]  Instruction Review  Code  2- meets goals/outcomes      Go Sex-Intimacy & Heart Disease, Get SMART - Goal Setting: - Group verbal and written instruction through game format to discuss heart disease and the return to sexual intimacy. Provides group verbal and written material to discuss and apply goal setting through the application of the S.M.A.R.T. Method.   Other Matters of the Heart: - Provides group verbal, written materials and models to describe Heart Failure, Angina, Valve Disease, and Diabetes in the realm of heart disease. Includes description of the disease process and treatment options available to the cardiac patient.   Exercise & Equipment Safety: - Individual verbal instruction and demonstration of equipment use and safety with use of the equipment.   Cardiac Rehab from 05/24/2017 in [facility] Cardiac and Pulmonary Rehab  Date  04/19/17  Educator  C. EnterkinRN  Instruction Review Code  1- partially meets, needs review/practice      Infection Prevention: - Provides verbal and written material to individual with discussion of infection control including proper hand washing and proper equipment cleaning during exercise session.   Cardiac Rehab from 05/24/2017 in [facility] Cardiac and Pulmonary Rehab  Date  04/19/17  Educator  C. EnterkinRN  Instruction Review Code  2- meets goals/outcomes      Falls Prevention: - Provides verbal and written material to individual with discussion of falls prevention and safety.   Cardiac Rehab from  05/24/2017 in [facility] Cardiac and Pulmonary Rehab  Date  04/19/17  Educator  C. Waverly  Instruction Review Code  2- meets goals/outcomes      Diabetes: - Individual verbal and written instruction to review signs/symptoms of diabetes, desired ranges of glucose level fasting, after meals and with exercise. Advice that pre and post exercise glucose checks will be done for 3 sessions at entry of program.    Knowledge Questionnaire Score:     Knowledge Questionnaire Score - 04/19/17 1303      Knowledge Questionnaire Score   Pre Score 19      Core Components/Risk Factors/Patient Goals at Admission:     Personal Goals and Risk Factors at Admission - 04/19/17 1309      Core Components/Risk Factors/Patient Goals on Admission   Personal Goal Other Yes   Personal Goal Learn what to eat after his open heart surgery. Find out his lifting restriction from his MD if it is still 3 lbs or less   Intervention Dietician appt made for Oh[place]. Oseias will see his surgeon this week   Expected Outcomes Heart healthy eating. Healing after his CABG surgery with no complications      Core Components/Risk Factors/Patient Goals Review:      Goals and Risk Factor Review    Row Name 05/14/17 [phone]             Core Components/Risk Factors/Patient Goals Review   Personal Goals Review Other       Review Tymel said his CABG incision is healing from the inside out and he feels it at times but he is doing ok. Beck reports that he feels "a whole lot better than what he did and the exercise is helping". His daughter gave him a treadmill for his house which he is working out on.        Expected Outcomes Heart healthy lifestyle          Core Components/Risk Factors/Patient Goals at Discharge (Final Review):      Goals and Risk Factor Review - 05/14/17 0321  Core Components/Risk Factors/Patient Goals Review   Personal Goals Review Other   Review Dalante said his CABG incision is healing from the  inside out and he feels it at times but he is doing ok. Jourdin reports that he feels "a whole lot better than what he did and the exercise is helping". His daughter gave him a treadmill for his house which he is working out on.    Expected Outcomes Heart healthy lifestyle      ITP Comments:     ITP Comments    Row Name 04/19/17 1301 04/28/17 0651 05/26/17 0630       ITP Comments ITP created during Medical Review after Cardiac Rehab informed consent was signed. DX listed in CHL/EPIC 30 day review. Continue with ITP unless directed changes per Medical Director review. New to program 30 day review. Continue with ITP unless directed changes per Medical Director review           Comments:

## 2017-05-28 DIAGNOSIS — Z951 Presence of aortocoronary bypass graft: Secondary | ICD-10-CM

## 2017-05-28 NOTE — Progress Notes (Signed)
Daily Session Note  Patient Details  Name: Seth Byrd MRN: [id number] Date of Birth: [birth year] Referring Provider:     Cardiac Rehab from 04/19/2017 in [facility] Cardiac and Pulmonary Rehab  Referring Provider  [name] MD      Encounter Date: 05/28/2017  Check In:     Session Check In - 05/28/17 0852      Check-In   Location [facility]-Cardiac & Pulmonary Rehab   Staff Present Alberteen Sam, MA, ACSM RCEP, Exercise Physiologist;Kaleab Frasier Oletta Darter, BA, ACSM CEP, Exercise Physiologist;Diane [facility] RN,BSN   Supervising physician immediately available to respond to emergencies See telemetry face sheet for immediately available ER MD   Medication changes reported     No   Fall or balance concerns reported    No   Warm-up and Cool-down Performed on first and last piece of equipment   Resistance Training Performed Yes   VAD Patient? No     Pain Assessment   Currently in Pain? No/denies         History  Smoking Status  . Former Smoker  . Packs/day: 1.50  . Years: 35.00  . Types: Cigarettes  . Quit date: 04/09/2005  Smokeless Tobacco  . Never Used    Goals Met:  Independence with exercise equipment Exercise tolerated well No report of cardiac concerns or symptoms Strength training completed today  Goals Unmet:  Not Applicable  Comments: Pt able to follow exercise prescription today without complaint.  Will continue to monitor for progression.    Dr. Emily Filbert is Medical Director for [place] and [name].

## 2017-05-31 ENCOUNTER — Encounter: Payer: 59 | Admitting: *Deleted

## 2017-05-31 DIAGNOSIS — Z951 Presence of aortocoronary bypass graft: Secondary | ICD-10-CM | POA: Diagnosis not present

## 2017-05-31 NOTE — Progress Notes (Signed)
Daily Session Note  Patient Details  Name: Seth Byrd MRN: [id number] Date of Birth: 04-May-1946 Referring Provider:     Cardiac Rehab from 04/19/2017 in [facility] Cardiac and Pulmonary Rehab  Referring Provider  [name] MD      Encounter Date: 05/31/2017  Check In:     Session Check In - 05/31/17 0744      Check-In   Location [facility]Cardiac & Pulmonary Rehab   Staff Present Gerlene Burdock, RN, Levie Heritage, MA, ACSM RCEP, Exercise Physiologist;Maleigha Colvard Amedeo Plenty, BS, ACSM CEP, Exercise Physiologist   Supervising physician immediately available to respond to emergencies See telemetry face sheet for immediately available ER MD   Medication changes reported     No   Fall or balance concerns reported    No   Warm-up and Cool-down Performed on first and last piece of equipment   Resistance Training Performed Yes   VAD Patient? No     Pain Assessment   Currently in Pain? No/denies   Multiple Pain Sites No         History  Smoking Status  . Former Smoker  . Packs/day: 1.50  . Years: 35.00  . Types: Cigarettes  . Quit date: 04/09/2005  Smokeless Tobacco  . Never Used    Goals Met:  Independence with exercise equipment Exercise tolerated well No report of cardiac concerns or symptoms Strength training completed today  Goals Unmet:  Not Applicable  Comments: Pt able to follow exercise prescription today without complaint.  Will continue to monitor for progression.    Dr. Emily Filbert is Medical Director for [place] and [name].

## 2017-06-02 DIAGNOSIS — Z951 Presence of aortocoronary bypass graft: Secondary | ICD-10-CM | POA: Diagnosis not present

## 2017-06-02 NOTE — Progress Notes (Signed)
Daily Session Note  Patient Details  Name: Seth Byrd MRN: [id number] Date of Birth: 06-Sep-1946 Referring Provider:     Cardiac Rehab from 04/19/2017 in [facility] Cardiac and Pulmonary Rehab  Referring Provider  [name] MD      Encounter Date: 06/02/2017  Check In:     Session Check In - 06/02/17 0800      Check-In   Location [facility]-Cardiac & Pulmonary Rehab   Staff Present Heath Lark, RN, BSN, CCRP;Jessica Luan Pulling, MA, ACSM RCEP, Exercise Physiologist;Shaquetta Arcos Flavia Shipper   Supervising physician immediately available to respond to emergencies See telemetry face sheet for immediately available ER MD   Medication changes reported     No   Fall or balance concerns reported    No   Warm-up and Cool-down Performed on first and last piece of equipment   Resistance Training Performed Yes   VAD Patient? No     Pain Assessment   Currently in Pain? No/denies   Multiple Pain Sites No         History  Smoking Status  . Former Smoker  . Packs/day: 1.50  . Years: 35.00  . Types: Cigarettes  . Quit date: 04/09/2005  Smokeless Tobacco  . Never Used    Goals Met:  Independence with exercise equipment Exercise tolerated well No report of cardiac concerns or symptoms Strength training completed today  Goals Unmet:  Not Applicable  Comments: Pt able to follow exercise prescription today without complaint.  Will continue to monitor for progression.   Dr. Emily Filbert is Medical Director for [place] and [name].

## 2017-06-04 ENCOUNTER — Encounter: Payer: 59 | Admitting: *Deleted

## 2017-06-04 DIAGNOSIS — Z951 Presence of aortocoronary bypass graft: Secondary | ICD-10-CM

## 2017-06-04 NOTE — Progress Notes (Signed)
Daily Session Note  Patient Details  Name: Seth Byrd MRN: [id number] Date of Birth: [birth year] Referring Provider:     Cardiac Rehab from 04/19/2017 in [facility] Cardiac and Pulmonary Rehab  Referring Provider  [name] MD      Encounter Date: 06/04/2017  Check In:     Session Check In - 06/04/17 0900      Check-In   Location [facility]-Cardiac & Pulmonary Rehab   Staff Present Alberteen Sam, MA, ACSM RCEP, Exercise Physiologist;Amanda Oletta Darter, BA, ACSM CEP, Exercise Physiologist;Carroll Enterkin, RN, BSN   Supervising physician immediately available to respond to emergencies See telemetry face sheet for immediately available ER MD   Medication changes reported     No   Fall or balance concerns reported    No   Warm-up and Cool-down Performed on first and last piece of equipment   Resistance Training Performed Yes   VAD Patient? No     Pain Assessment   Currently in Pain? No/denies   Multiple Pain Sites No           Exercise Prescription Changes - 06/03/17 1100      Response to Exercise   Blood Pressure (Admit) 126/60   Blood Pressure (Exercise) 118/58   Blood Pressure (Exit) 124/62   Heart Rate (Admit) 65 bpm   Heart Rate (Exercise) 103 bpm   Heart Rate (Exit) 65 bpm   Perceived Dyspnea (Exercise) 15   Symptoms none   Duration Continue with 45 min of aerobic exercise without signs/symptoms of physical distress.   Intensity THRR unchanged     Progression   Progression Continue to progress workloads to maintain intensity without signs/symptoms of physical distress.   Average METs 2.89     Resistance Training   Training Prescription Yes   Weight 3 lb   Reps 10-15     Interval Training   Interval Training No     Treadmill   MPH 2.5   Grade 2   Minutes 15   METs 3.6     Recumbant Bike   Level 4   Watts 16   Minutes 15   METs 2.56     NuStep   Level 3   Minutes 15   METs 2.5     Home Exercise Plan   Plans to continue exercise at Home (comment)   walk and stationary bike   Frequency Add 2 additional days to program exercise sessions.   Initial Home Exercises Provided 04/28/17      History  Smoking Status  . Former Smoker  . Packs/day: 1.50  . Years: 35.00  . Types: Cigarettes  . Quit date: 04/09/2005  Smokeless Tobacco  . Never Used    Goals Met:  Independence with exercise equipment Exercise tolerated well Personal goals reviewed No report of cardiac concerns or symptoms Strength training completed today  Goals Unmet:  Not Applicable  Comments: Pt able to follow exercise prescription today without complaint.  Will continue to monitor for progression.    Dr. Emily Filbert is Medical Director for [place] and [name].

## 2017-06-07 ENCOUNTER — Encounter: Payer: 59 | Admitting: *Deleted

## 2017-06-07 DIAGNOSIS — Z951 Presence of aortocoronary bypass graft: Secondary | ICD-10-CM | POA: Diagnosis not present

## 2017-06-07 NOTE — Progress Notes (Signed)
Daily Session Note  Patient Details  Name: Ramsey Guadamuz MRN: [id number] Date of Birth: 07-25-1946 Referring Provider:     Cardiac Rehab from 04/19/2017 in [facility] Cardiac and Pulmonary Rehab  Referring Provider  [name] MD      Encounter Date: 06/07/2017  Check In:     Session Check In - 06/07/17 0747      Check-In   Location [facility]-Cardiac & Pulmonary Rehab   Staff Present Alberteen Sam, MA, ACSM RCEP, Exercise Physiologist;Kelly Amedeo Plenty, BS, ACSM CEP, Exercise Physiologist;Carroll Enterkin, RN, BSN   Supervising physician immediately available to respond to emergencies See telemetry face sheet for immediately available ER MD   Medication changes reported     No   Fall or balance concerns reported    No   Warm-up and Cool-down Performed on first and last piece of equipment   Resistance Training Performed Yes   VAD Patient? No     Pain Assessment   Currently in Pain? No/denies   Multiple Pain Sites No         History  Smoking Status  . Former Smoker  . Packs/day: 1.50  . Years: 35.00  . Types: Cigarettes  . Quit date: 04/09/2005  Smokeless Tobacco  . Never Used    Goals Met:  Independence with exercise equipment Exercise tolerated well No report of cardiac concerns or symptoms Strength training completed today  Goals Unmet:  Not Applicable  Comments: Pt able to follow exercise prescription today without complaint.  Will continue to monitor for progression.    Dr. Emily Filbert is Medical Director for [place] and [name].

## 2017-06-09 ENCOUNTER — Encounter: Payer: 59 | Attending: Internal Medicine

## 2017-06-09 DIAGNOSIS — Z951 Presence of aortocoronary bypass graft: Secondary | ICD-10-CM | POA: Insufficient documentation

## 2017-06-09 NOTE — Progress Notes (Signed)
Daily Session Note  Patient Details  Name: Danniel Merrow MRN: [id number] Date of Birth: 02/21/1946 Referring Provider:     Cardiac Rehab from 04/19/2017 in [facility] Cardiac and Pulmonary Rehab  Referring Provider  Fath, Kenneth MD      Encounter Date: 06/09/2017  Check In:     Session Check In - 06/09/17 0758      Check-In   Location [facility]-Cardiac & Pulmonary Rehab   Staff Present Jessica Hawkins, MA, ACSM RCEP, Exercise Physiologist;Susanne Bice, RN, BSN, CCRP;Joseph Hood RCP,RRT,BSRT   Supervising physician immediately available to respond to emergencies See telemetry face sheet for immediately available ER MD   Medication changes reported     No   Fall or balance concerns reported    No   Warm-up and Cool-down Performed on first and last piece of equipment   Resistance Training Performed Yes   VAD Patient? No     Pain Assessment   Currently in Pain? No/denies   Multiple Pain Sites No         History  Smoking Status  . Former Smoker  . Packs/day: 1.50  . Years: 35.00  . Types: Cigarettes  . Quit date: 04/09/2005  Smokeless Tobacco  . Never Used    Goals Met:  Independence with exercise equipment Exercise tolerated well No report of cardiac concerns or symptoms Strength training completed today  Goals Unmet:  Not Applicable  Comments: Pt able to follow exercise prescription today without complaint.  Will continue to monitor for progression.      6 Minute Walk    Row Name 04/19/17 1506 06/09/17 0831       6 Minute Walk   Phase Initial Discharge    Distance 1112 feet 1400 feet    Distance % Change  - 25.9 %  288 ft    Walk Time 6 minutes 6 minutes    # of Rest Breaks 0 0    MPH 2.11 2.65    METS 2.78 3.51    RPE 16 15    VO2 Peak 9.74 12.26    Symptoms No No    Resting HR 77 bpm 80 bpm    Resting BP 126/60 126/60    Max Ex. HR 96 bpm 120 bpm    Max Ex. BP 136/56 136/70    2 Minute Post BP 130/60  -        Dr. Mark Miller is Medical Director  for [facility] and [name]. 

## 2017-06-11 ENCOUNTER — Encounter: Payer: 59 | Admitting: *Deleted

## 2017-06-11 DIAGNOSIS — Z951 Presence of aortocoronary bypass graft: Secondary | ICD-10-CM | POA: Diagnosis not present

## 2017-06-11 NOTE — Patient Instructions (Signed)
Discharge Instructions  Patient Details  Name: Seth Byrd MRN: [id number] Date of Birth: 07/08/1946 Referring Provider:  [name], MD   Number of Visits: 36/36  Reason for Discharge:  Patient reached a stable level of exercise. Patient independent in their exercise.  Smoking History:  History  Smoking Status  . Former Smoker  . Packs/day: 1.50  . Years: 35.00  . Types: Cigarettes  . Quit date: 04/09/2005  Smokeless Tobacco  . Never Used    Diagnosis:  S/P CABG x 3  Initial Exercise Prescription:     Initial Exercise Prescription - 04/19/17 1500      Date of Initial Exercise RX and Referring Provider   Date 04/19/17   Referring Provider Seth Bill MD     Treadmill   MPH 2   Grade 0.5   Minutes 15   METs 2.67     Recumbant Bike   Level 2   RPM 50   Watts 20   Minutes 15   METs 2.7     NuStep   Level 1   SPM 80   Minutes 15   METs 2     Prescription Details   Frequency (times per week) 3   Duration Progress to 45 minutes of aerobic exercise without signs/symptoms of physical distress     Intensity   THRR 40-80% of Max Heartrate 106-135   Ratings of Perceived Exertion 11-13   Perceived Dyspnea 0-4     Progression   Progression Continue to progress workloads to maintain intensity without signs/symptoms of physical distress.     Resistance Training   Training Prescription Yes   Weight 3lbs   Reps 10-15      Discharge Exercise Prescription (Final Exercise Prescription Changes):     Exercise Prescription Changes - 06/03/17 1100      Response to Exercise   Blood Pressure (Admit) 126/60   Blood Pressure (Exercise) 118/58   Blood Pressure (Exit) 124/62   Heart Rate (Admit) 65 bpm   Heart Rate (Exercise) 103 bpm   Heart Rate (Exit) 65 bpm   Perceived Dyspnea (Exercise) 15   Symptoms none   Duration Continue with 45 min of aerobic exercise without signs/symptoms of physical distress.   Intensity THRR unchanged     Progression   Progression Continue to progress workloads to maintain intensity without signs/symptoms of physical distress.   Average METs 2.89     Resistance Training   Training Prescription Yes   Weight 3 lb   Reps 10-15     Interval Training   Interval Training No     Treadmill   MPH 2.5   Grade 2   Minutes 15   METs 3.6     Recumbant Bike   Level 4   Watts 16   Minutes 15   METs 2.56     NuStep   Level 3   Minutes 15   METs 2.5     Home Exercise Plan   Plans to continue exercise at Home (comment)  walk and stationary bike   Frequency Add 2 additional days to program exercise sessions.   Initial Home Exercises Provided 04/28/17      Functional Capacity:     6 Minute Walk    Row Name 04/19/17 1506 06/09/17 0831       6 Minute Walk   Phase Initial Discharge    Distance 1112 feet 1400 feet    Distance % Change  - 25.9 %  288 ft  Walk Time 6 minutes 6 minutes    # of Rest Breaks 0 0    MPH 2.11 2.65    METS 2.78 3.51    RPE 16 15    VO2 Peak 9.74 12.26    Symptoms No No    Resting HR 77 bpm 80 bpm    Resting BP 126/60 126/60    Max Ex. HR 96 bpm 120 bpm    Max Ex. BP 136/56 136/70    2 Minute Post BP 130/60  -       Quality of Life:     Quality of Life - 06/04/17 0924      Quality of Life Scores   Health/Function Pre 27.23 %   Health/Function Post 27.47 %   Health/Function % Change 0.88 %   Socioeconomic Pre 26.57 %   Socioeconomic Post 26.79 %   Socioeconomic % Change  0.83 %   Psych/Spiritual Pre 29.14 %   Psych/Spiritual Post 28.07 %   Psych/Spiritual % Change -3.67 %   Family Pre 29.38 %   Family Post 27 %   Family % Change -8.1 %   GLOBAL Pre 27.79 %   GLOBAL Post 27.38 %   GLOBAL % Change -1.48 %      Personal Goals: Goals established at orientation with interventions provided to work toward goal.     Personal Goals and Risk Factors at Admission - 04/19/17 1309      Core Components/Risk Factors/Patient Goals on  Admission   Personal Goal Other Yes   Personal Goal Learn what to eat after his open heart surgery. Find out his lifting restriction from his MD if it is still 3 lbs or less   Intervention Dietician appt made for Seth Byrd. Seth Byrd will see his surgeon this week   Expected Outcomes Heart healthy eating. Healing after his CABG surgery with no complications       Personal Goals Discharge:     Goals and Risk Factor Review - 06/09/17 0829      Core Components/Risk Factors/Patient Goals Review   Personal Goals Review Weight Management/Obesity;Hypertension;Lipids   Review Seth Byrd has been gaining some weight.  He is feeling better!  He check his blood pressures regularly at home and they have been good. He does not have any problems with his medications.     Expected Outcomes Short: Try to lose weight.  Long: Continue to work on risk factor modifications.       Nutrition & Weight - Outcomes:     Pre Biometrics - 04/19/17 1519      Pre Biometrics   Height 5' 10.5" (1.791 m)   Weight 176 lb 12.8 oz (80.2 kg)   Waist Circumference 34 inches   Hip Circumference 35.25 inches   Waist to Hip Ratio 0.96 %   BMI (Calculated) 25.1   Single Leg Stand 1.12 seconds       Nutrition:     Nutrition Therapy & Goals - 05/24/17 1148      Nutrition Therapy   Diet Instructed patient on heart healthy dietary guidelines based on 2000 calorie diet.   Drug/Food Interactions Statins/Certain Fruits   Protein (specify units) 8   Fiber 20 grams   Whole Grain Foods 3 servings   Saturated Fats 14 max. grams   Fruits and Vegetables 5 servings/day  8-10 ideal   Sodium 2000 grams  less than 1500 ideal     Personal Nutrition Goals   Nutrition Goal To continue to read food  labels for saturated fat, trans fat and sodium   Personal Goal #2 To include a protein food with most meals in order to meet goal of 8 oz protein food daily.   Comments Patient has made significant positive diet changes since his CABG  surgery and has great support from his daughters regarding his diet and food preparation.     Intervention Plan   Intervention Prescribe, educate and counsel regarding individualized specific dietary modifications aiming towards targeted core components such as weight, hypertension, lipid management, diabetes, heart failure and other comorbidities.;Nutrition handout(s) given to patient.   Expected Outcomes Short Term Goal: Understand basic principles of dietary content, such as calories, fat, sodium, cholesterol and nutrients.;Short Term Goal: A plan has been developed with personal nutrition goals set during dietitian appointment.;Long Term Goal: Adherence to prescribed nutrition plan.      Nutrition Discharge:     Nutrition Assessments - 06/04/17 0923      MEDFICTS Scores   Pre Score 12   Post Score 9   Score Difference -3      Education Questionnaire Score:     Knowledge Questionnaire Score - 06/04/17 0923      Knowledge Questionnaire Score   Pre Score 19/28   Post Score 22/28  reviewed results with patient today      Goals reviewed with patient; copy given to patient.

## 2017-06-11 NOTE — Progress Notes (Signed)
Daily Session Note  Patient Details  Name: Seth Byrd MRN: [id number] Date of Birth: 09/22/1946 Referring Provider:     Cardiac Rehab from 04/19/2017 in [facility] Cardiac and Pulmonary Rehab  Referring Provider  Fath, Kenneth MD      Encounter Date: 06/11/2017  Check In:     Session Check In - 06/11/17 0823      Check-In   Location [facility]-Cardiac & Pulmonary Rehab   Staff Present Carroll Enterkin, RN, BSN;Jessica Hawkins, MA, ACSM RCEP, Exercise Physiologist;Amanda Sommer, BA, ACSM CEP, Exercise Physiologist   Supervising physician immediately available to respond to emergencies See telemetry face sheet for immediately available ER MD   Medication changes reported     No   Fall or balance concerns reported    No   Warm-up and Cool-down Performed on first and last piece of equipment   Resistance Training Performed Yes   VAD Patient? No     Pain Assessment   Currently in Pain? No/denies   Multiple Pain Sites No         History  Smoking Status  . Former Smoker  . Packs/day: 1.50  . Years: 35.00  . Types: Cigarettes  . Quit date: 04/09/2005  Smokeless Tobacco  . Never Used    Goals Met:  Independence with exercise equipment Exercise tolerated well No report of cardiac concerns or symptoms Strength training completed today  Goals Unmet:  Not Applicable  Comments: Pt able to follow exercise prescription today without complaint.  Will continue to monitor for progression.    Dr. Mark Miller is Medical Director for [facility] and [name]. 

## 2017-06-14 ENCOUNTER — Encounter: Payer: 59 | Admitting: *Deleted

## 2017-06-14 VITALS — Ht 70.5 in | Wt 179.0 lb

## 2017-06-14 DIAGNOSIS — Z951 Presence of aortocoronary bypass graft: Secondary | ICD-10-CM | POA: Diagnosis not present

## 2017-06-14 NOTE — Progress Notes (Signed)
Discharge Summary  Patient Details  Name: Seth Byrd MRN: [id number] Date of Birth: 01-18-1946 Referring Provider:     Cardiac Rehab from 04/19/2017 in [facility]  Referring Provider  Seth Bill MD       Number of Visits: 36/36  Reason for Discharge:  Patient reached a stable level of exercise. Patient independent in their exercise.  Smoking History:  History  Smoking Status  . Former Smoker  . Packs/day: 1.50  . Years: 35.00  . Types: Cigarettes  . Quit date: 04/09/2005  Smokeless Tobacco  . Never Used    Diagnosis:  S/P CABG x 3  ADL UCSD:   Initial Exercise Prescription:     Initial Exercise Prescription - 04/19/17 1500      Date of Initial Exercise RX and Referring Provider   Date 04/19/17   Referring Provider Seth Bill MD     Treadmill   MPH 2   Grade 0.5   Minutes 15   METs 2.67     Recumbant Bike   Level 2   RPM 50   Watts 20   Minutes 15   METs 2.7     NuStep   Level 1   SPM 80   Minutes 15   METs 2     Prescription Details   Frequency (times per week) 3   Duration Progress to 45 minutes of aerobic exercise without signs/symptoms of physical distress     Intensity   THRR 40-80% of Max Heartrate 106-135   Ratings of Perceived Exertion 11-13   Perceived Dyspnea 0-4     Progression   Progression Continue to progress workloads to maintain intensity without signs/symptoms of physical distress.     Resistance Training   Training Prescription Yes   Weight 3lbs   Reps 10-15      Discharge Exercise Prescription (Final Exercise Prescription Changes):     Exercise Prescription Changes - 06/03/17 1100      Response to Exercise   Blood Pressure (Admit) 126/60   Blood Pressure (Exercise) 118/58   Blood Pressure (Exit) 124/62   Heart Rate (Admit) 65 bpm   Heart Rate (Exercise) 103 bpm   Heart Rate (Exit) 65 bpm   Perceived Dyspnea (Exercise) 15   Symptoms none   Duration Continue with 45 min of  aerobic exercise without signs/symptoms of physical distress.   Intensity THRR unchanged     Progression   Progression Continue to progress workloads to maintain intensity without signs/symptoms of physical distress.   Average METs 2.89     Resistance Training   Training Prescription Yes   Weight 3 lb   Reps 10-15     Interval Training   Interval Training No     Treadmill   MPH 2.5   Grade 2   Minutes 15   METs 3.6     Recumbant Bike   Level 4   Watts 16   Minutes 15   METs 2.56     NuStep   Level 3   Minutes 15   METs 2.5     Home Exercise Plan   Plans to continue exercise at Home (comment)  walk and stationary bike   Frequency Add 2 additional days to program exercise sessions.   Initial Home Exercises Provided 04/28/17      Functional Capacity:     6 Minute Walk    Row Name 04/19/17 1506 06/09/17 0831       6 Minute Walk  Phase Initial Discharge    Distance 1112 feet 1400 feet    Distance % Change  - 25.9 %  288 ft    Walk Time 6 minutes 6 minutes    # of Rest Breaks 0 0    MPH 2.11 2.65    METS 2.78 3.51    RPE 16 15    VO2 Peak 9.74 12.26    Symptoms No No    Resting HR 77 bpm 80 bpm    Resting BP 126/60 126/60    Max Ex. HR 96 bpm 120 bpm    Max Ex. BP 136/56 136/70    2 Minute Post BP 130/60  -       Psychological, QOL, Others - Outcomes: PHQ 2/9: Depression screen [facility] 2/9 06/04/2017 04/19/2017 03/16/2017 08/07/2016 05/22/2016  Decreased Interest 3 0 0 0 0  Down, Depressed, Hopeless 0 0 0 0 0  PHQ - 2 Score 3 0 0 0 0  Altered sleeping 0 0 - - -  Tired, decreased energy 0 0 - - -  Change in appetite 0 0 - - -  Feeling bad or failure about yourself  0 0 - - -  Trouble concentrating 0 0 - - -  Moving slowly or fidgety/restless 0 0 - - -  Suicidal thoughts 0 0 - - -  PHQ-9 Score 3 0 - - -  Difficult doing work/chores Not difficult at all - - - -    Quality of Life:     Quality of Life - 06/04/17 0924      Quality of Life Scores    Health/Function Pre 27.23 %   Health/Function Post 27.47 %   Health/Function % Change 0.88 %   Socioeconomic Pre 26.57 %   Socioeconomic Post 26.79 %   Socioeconomic % Change  0.83 %   Psych/Spiritual Pre 29.14 %   Psych/Spiritual Post 28.07 %   Psych/Spiritual % Change -3.67 %   Family Pre 29.38 %   Family Post 27 %   Family % Change -8.1 %   GLOBAL Pre 27.79 %   GLOBAL Post 27.38 %   GLOBAL % Change -1.48 %      Personal Goals: Goals established at orientation with interventions provided to work toward goal.     Personal Goals and Risk Factors at Admission - 04/19/17 1309      Core Components/Risk Factors/Patient Goals on Admission   Personal Goal Other Yes   Personal Goal Learn what to eat after his open heart surgery. Find out his lifting restriction from his MD if it is still 3 lbs or less   Intervention Dietician appt made for Seth Byrd. Seth Byrd will see his surgeon this week   Expected Outcomes Heart healthy eating. Healing after his CABG surgery with no complications       Personal Goals Discharge:     Goals and Risk Factor Review    Row Name 05/14/17 0742 06/09/17 0829           Core Components/Risk Factors/Patient Goals Review   Personal Goals Review Other Weight Management/Obesity;Hypertension;Lipids      Review Seth Byrd said his CABG incision is healing from the inside out and he feels it at times but he is doing ok. Seth Byrd reports that he feels "a whole lot better than what he did and the exercise is helping". His daughter gave him a treadmill for his house which he is working out on.  Seth Byrd has been gaining some weight.  He is feeling better!  He check his blood pressures regularly at home and they have been good. He does not have any problems with his medications.        Expected Outcomes Heart healthy lifestyle Short: Try to lose weight.  Long: Continue to work on risk factor modifications.          Nutrition & Weight - Outcomes:     Pre Biometrics - 04/19/17  1519      Pre Biometrics   Height 5' 10.5" (1.791 m)   Weight 176 lb 12.8 oz (80.2 kg)   Waist Circumference 34 inches   Hip Circumference 35.25 inches   Waist to Hip Ratio 0.96 %   BMI (Calculated) 25.1   Single Leg Stand 1.12 seconds         Post Biometrics - 06/14/17 0836       Post  Biometrics   Height 5' 10.5" (1.791 m)   Weight 179 lb (81.2 kg)   Waist Circumference 34.5 inches   Hip Circumference 37.5 inches   Waist to Hip Ratio 0.92 %   BMI (Calculated) 25.4   Single Leg Stand 2.25 seconds      Nutrition:     Nutrition Therapy & Goals - 05/24/17 1148      Nutrition Therapy   Diet Instructed patient on heart healthy dietary guidelines based on 2000 calorie diet.   Drug/Food Interactions Statins/Certain Fruits   Protein (specify units) 8   Fiber 20 grams   Whole Grain Foods 3 servings   Saturated Fats 14 max. grams   Fruits and Vegetables 5 servings/day  8-10 ideal   Sodium 2000 grams  less than 1500 ideal     Personal Nutrition Goals   Nutrition Goal To continue to read food labels for saturated fat, trans fat and sodium   Personal Goal #2 To include a protein food with most meals in order to meet goal of 8 oz protein food daily.   Comments Patient has made significant positive diet changes since his CABG surgery and has great support from his daughters regarding his diet and food preparation.     Intervention Plan   Intervention Prescribe, educate and counsel regarding individualized specific dietary modifications aiming towards targeted core components such as weight, hypertension, lipid management, diabetes, heart failure and other comorbidities.;Nutrition handout(s) given to patient.   Expected Outcomes Short Term Goal: Understand basic principles of dietary content, such as calories, fat, sodium, cholesterol and nutrients.;Short Term Goal: A plan has been developed with personal nutrition goals set during dietitian appointment.;Long Term Goal: Adherence  to prescribed nutrition plan.      Nutrition Discharge:     Nutrition Assessments - 06/04/17 0923      MEDFICTS Scores   Pre Score 12   Post Score 9   Score Difference -3      Education Questionnaire Score:     Knowledge Questionnaire Score - 06/04/17 0923      Knowledge Questionnaire Score   Pre Score 19/28   Post Score 22/28  reviewed results with patient today      Goals reviewed with patient; copy given to patient.

## 2017-06-14 NOTE — Progress Notes (Signed)
Cardiac Individual Treatment Plan  Patient Details  Name: Seth Byrd MRN: [id number] Date of Birth: November 30, 1945 Referring Provider:     Cardiac Rehab from 04/19/2017 in [facility] Cardiac and Pulmonary Rehab  Referring Provider  [name] MD      Initial Encounter Date:    Cardiac Rehab from 04/19/2017 in [facility] Cardiac and Pulmonary Rehab  Date  04/19/17  Referring Provider  [name] MD      Visit Diagnosis: S/P CABG x 3  Patient's Home Medications on Admission:  Current Outpatient Prescriptions:  .  acetaminophen (TYLENOL) 500 MG tablet, Take 500 mg by mouth every 6 (six) hours as needed., Disp: , Rfl:  .  aspirin EC 325 MG EC tablet, Take 1 tablet (325 mg total) by mouth daily., Disp: 30 tablet, Rfl: 0 .  atorvastatin (LIPITOR) 40 MG tablet, Take 1 tablet (40 mg total) by mouth daily at 6 PM., Disp: , Rfl:  .  carvedilol (COREG) 12.5 MG tablet, Take 1 tablet (12.5 mg total) by mouth 2 (two) times daily with a meal., Disp: 60 tablet, Rfl: 3 .  Emollient (CERAVE) CREA, Apply topically., Disp: , Rfl:  .  etanercept (ENBREL) 50 MG/ML injection, Inject 50 mg into the skin once a week. Saturday, Disp: , Rfl:  .  furosemide (LASIX) 40 MG tablet, Take 1 tablet (40 mg total) by mouth daily., Disp: 30 tablet, Rfl:  .  Multiple Vitamins-Minerals (ONE-A-DAY MENS 50+ ADVANTAGE PO), Take 1 tablet by mouth daily., Disp: , Rfl:  .  Omega-3 Fatty Acids (FISH OIL) 1200 MG CAPS, Take 1 capsule by mouth 2 (two) times daily. , Disp: , Rfl:  .  sacubitril-valsartan (ENTRESTO) 24-26 MG, Take 1 tablet by mouth 2 (two) times daily., Disp: 60 tablet, Rfl: 11 .  spironolactone (ALDACTONE) 25 MG tablet, Take 1 tablet (25 mg total) by mouth daily., Disp: , Rfl:  .  vitamin B-12 (CYANOCOBALAMIN) 1000 MCG tablet, Take 1,000 mcg by mouth daily. , Disp: , Rfl:   Past Medical History: Past Medical History:  Diagnosis Date  . Arthritis   . Hypertension     Tobacco Use: History  Smoking Status  . Former  Smoker  . Packs/day: 1.50  . Years: 35.00  . Types: Cigarettes  . Quit date: 04/09/2005  Smokeless Tobacco  . Never Used    Labs: Recent Review Flowsheet Data    Labs for ITP Cardiac and Pulmonary Rehab Latest Ref Rng & Units 02/10/2017 02/11/2017 02/11/2017 02/11/2017 02/12/2017   Cholestrol 100 - 199 mg/dL - - - - -   LDLCALC 0 - 99 mg/dL - - - - -   HDL >39 mg/dL - - - - -   Trlycerides 0 - 149 mg/dL - - - - -   Hemoglobin A1c 4.8 - 5.6 % - - - - -   PHART 7.350 - 7.450 7.391 7.372 - - -   PCO2ART 32.0 - 48.0 mmHg 36.8 37.3 - - -   HCO3 20.0 - 28.0 mmol/L 22.3 21.6 - - -   TCO2 0 - 100 mmol/L 23 23 - 24 -   ACIDBASEDEF 0.0 - 2.0 mmol/L 2.0 3.0(H) - - -   O2SAT % 93.0 93.0 63.0 - 56.8       Exercise Target Goals:    Exercise Program Goal: Individual exercise prescription set with THRR, safety & activity barriers. Participant demonstrates ability to understand and report RPE using BORG scale, to self-measure pulse accurately, and to acknowledge the importance of  the exercise prescription.  Exercise Prescription Goal: Starting with aerobic activity 30 plus minutes a day, 3 days per week for initial exercise prescription. Provide home exercise prescription and guidelines that participant acknowledges understanding prior to discharge.  Activity Barriers & Risk Stratification:     Activity Barriers & Cardiac Risk Stratification - 04/19/17 1307      Activity Barriers & Cardiac Risk Stratification   Activity Barriers Arthritis;Joint Problems;Left Knee Replacement;Deconditioning  athritis in hands and wrist   Cardiac Risk Stratification High      6 Minute Walk:     6 Minute Walk    Row Name 04/19/17 1506 06/09/17 0831       6 Minute Walk   Phase Initial Discharge    Distance 1112 feet 1400 feet    Distance % Change  - 25.9 %  288 ft    Walk Time 6 minutes 6 minutes    # of Rest Breaks 0 0    MPH 2.11 2.65    METS 2.78 3.51    RPE 16 15    VO2 Peak 9.74 12.26     Symptoms No No    Resting HR 77 bpm 80 bpm    Resting BP 126/60 126/60    Max Ex. HR 96 bpm 120 bpm    Max Ex. BP 136/56 136/70    2 Minute Post BP 130/60  -       Oxygen Initial Assessment:   Oxygen Re-Evaluation:   Oxygen Discharge (Final Oxygen Re-Evaluation):   Initial Exercise Prescription:     Initial Exercise Prescription - 04/19/17 1500      Date of Initial Exercise RX and Referring Provider   Date 04/19/17   Referring Provider [name] MD     Treadmill   MPH 2   Grade 0.5   Minutes 15   METs 2.67     Recumbant Bike   Level 2   RPM 50   Watts 20   Minutes 15   METs 2.7     NuStep   Level 1   SPM 80   Minutes 15   METs 2     Prescription Details   Frequency (times per week) 3   Duration Progress to 45 minutes of aerobic exercise without signs/symptoms of physical distress     Intensity   THRR 40-80% of Max Heartrate 106-135   Ratings of Perceived Exertion 11-13   Perceived Dyspnea 0-4     Progression   Progression Continue to progress workloads to maintain intensity without signs/symptoms of physical distress.     Resistance Training   Training Prescription Yes   Weight 3lbs   Reps 10-15      Perform Capillary Blood Glucose checks as needed.  Exercise Prescription Changes:     Exercise Prescription Changes    Row Name 04/19/17 1500 04/21/17 1500 04/28/17 0900 05/04/17 1100 05/18/17 1500     Response to Exercise   Blood Pressure (Admit) 126/60 110/56  - 122/66 130/70   Blood Pressure (Exercise) 136/56 136/70  - 122/62 122/60   Blood Pressure (Exit) 130/60 110/68  - 120/74 124/62   Heart Rate (Admit) 77 bpm 81 bpm  - 77 bpm 73 bpm   Heart Rate (Exercise) 96 bpm 111 bpm  - 90 bpm 82 bpm   Heart Rate (Exit) 82 bpm 83 bpm  - 70 bpm 72 bpm   Oxygen Saturation (Admit) 96 %  -  -  -  -   Oxygen  Saturation (Exercise) 99 %  -  -  -  -   Perceived Dyspnea (Exercise) 16 12  - 14 15   Symptoms none none  - none none   Comments walk  test result  -  -  -  -   Duration  - Continue with 45 min of aerobic exercise without signs/symptoms of physical distress. Continue with 45 min of aerobic exercise without signs/symptoms of physical distress. Continue with 45 min of aerobic exercise without signs/symptoms of physical distress. Continue with 45 min of aerobic exercise without signs/symptoms of physical distress.   Intensity  - THRR unchanged THRR unchanged THRR unchanged THRR unchanged     Progression   Progression  - Continue to progress workloads to maintain intensity without signs/symptoms of physical distress. Continue to progress workloads to maintain intensity without signs/symptoms of physical distress. Continue to progress workloads to maintain intensity without signs/symptoms of physical distress. Continue to progress workloads to maintain intensity without signs/symptoms of physical distress.   Average METs  - 2.73 2.73 2.79 2.94     Resistance Training   Training Prescription  - Yes Yes Yes Yes   Weight  - 3lbs 3lbs 3lbs 3 lb   Reps  - 10-15 10-15 10-15 10-15     Interval Training   Interval Training  - No No No No     Treadmill   MPH  - '2 2 2  ' -   Grade  - 0.5 0.5 0.5  -   Minutes  - '15 15 15  ' -   METs  - 2.67 2.67 2.67  -     Recumbant Bike   Level  - '2 2 2 4   ' Watts  - '20 20 20 20   ' Minutes  - '15 15 15 15   ' METs  - 2.79 2.79 2.79 2.78     NuStep   Level  -  -  - 3 3   Minutes  -  -  - 15 15   METs  -  -  - 2.9 3.1     Home Exercise Plan   Plans to continue exercise at  -  - Home (comment)  walk and stationary bike Home (comment)  walk and stationary bike  -   Frequency  -  - Add 2 additional days to program exercise sessions. Add 2 additional days to program exercise sessions.  -   Initial Home Exercises Provided  -  - 04/28/17 04/28/17  -   [place] Name 06/03/17 1100             Response to Exercise   Blood Pressure (Admit) 126/60       Blood Pressure (Exercise) 118/58       Blood Pressure  (Exit) 124/62       Heart Rate (Admit) 65 bpm       Heart Rate (Exercise) 103 bpm       Heart Rate (Exit) 65 bpm       Perceived Dyspnea (Exercise) 15       Symptoms none       Duration Continue with 45 min of aerobic exercise without signs/symptoms of physical distress.       Intensity THRR unchanged         Progression   Progression Continue to progress workloads to maintain intensity without signs/symptoms of physical distress.       Average METs 2.89         Resistance Training  Training Prescription Yes       Weight 3 lb       Reps 10-15         Interval Training   Interval Training No         Treadmill   MPH 2.5       Grade 2       Minutes 15       METs 3.6         Recumbant Bike   Level 4       Watts 16       Minutes 15       METs 2.56         NuStep   Level 3       Minutes 15       METs 2.5         Home Exercise Plan   Plans to continue exercise at Home (comment)  walk and stationary bike       Frequency Add 2 additional days to program exercise sessions.       Initial Home Exercises Provided 04/28/17          Exercise Comments:     Exercise Comments    Row Name 04/21/17 0950 05/05/17 0755 06/14/17 0753       Exercise Comments First full day of exercise!  Patient was oriented to gym and equipment including functions, settings, policies, and procedures.  Patient's individual exercise prescription and treatment plan were reviewed.  All starting workloads were established based on the results of the 6 minute walk test done at initial orientation visit.  The plan for exercise progression was also introduced and progression will be customized based on patient's performance and goals. Reviewed METs average and discussed progression with pt today. Johndavid graduated today from cardiac rehab with 36 sessions completed.  Details of the patient's exercise prescription and what He needs to do in order to continue the prescription and progress were discussed with  patient.  Patient was given a copy of prescription and goals.  Patient verbalized understanding.  Riyansh plans to continue to exercise by walking and using stationary bike at home.        Exercise Goals and Review:     Exercise Goals    Row Name 04/19/17 1518             Exercise Goals   Increase Physical Activity Yes       Intervention Provide advice, education, support and counseling about physical activity/exercise needs.;Develop an individualized exercise prescription for aerobic and resistive training based on initial evaluation findings, risk stratification, comorbidities and participant's personal goals.       Expected Outcomes Achievement of increased cardiorespiratory fitness and enhanced flexibility, muscular endurance and strength shown through measurements of functional capacity and personal statement of participant.       Increase Strength and Stamina Yes       Intervention Provide advice, education, support and counseling about physical activity/exercise needs.;Develop an individualized exercise prescription for aerobic and resistive training based on initial evaluation findings, risk stratification, comorbidities and participant's personal goals.       Expected Outcomes Achievement of increased cardiorespiratory fitness and enhanced flexibility, muscular endurance and strength shown through measurements of functional capacity and personal statement of participant.          Exercise Goals Re-Evaluation :     Exercise Goals Re-Evaluation    Row Name 04/21/17 1550 04/28/17 0925 05/04/17 1144 05/18/17 1529 06/03/17 1119  Exercise Goal Re-Evaluation   Exercise Goals Review Increase Physical Activity;Increase Strenth and Stamina Increase Physical Activity;Increase Strenth and Stamina Increase Physical Activity;Increase Strenth and Stamina Increase Physical Activity;Increase Strenth and Stamina Increase Physical Activity;Increase Strenth and Stamina   Comments Quintell has  completed is first day of exercise.  We will continue to monitor his progression. Reviewed home exercise with pt today.  Pt plans to walk and use stationary bike at home for exercise.  Reviewed THR, pulse, RPE, sign and symptoms, and when to call 911 or MD.  Also discussed weather considerations and indoor options.  Pt voiced understanding. Azaria continues to do well in rehab.  He is now up to 20 Watts on the bike.  We will continue to monitor his progression. Othmar continues to progress with exercise. Staff will monitor RPE and adjust as needed.  Athanasius has been doing well in rehab.  He is now up to 2.5 mph and 2.0% grade on the treadmill.  He is also riding his bike at home some.  He is nearing graduation and will be doing his walk test next week.  We will continue to monitor his progress.   Expected Outcomes Continue to come to classes to work on increasing activity and improving strength and stamina. Short: Add in at least one day of exercise at home. Long: Make exercise part of routine. Short: Add more incline to treadmill.  Long: Continue to exercise at home. Short - Pt will regularly attend HT.  Long - Pt will continue to build overall strength and stamina. Short: Continue to work on increasing his workloads.  Long: Continue to exercise independently.   [place] Name 06/04/17 0900 06/09/17 0832           Exercise Goal Re-Evaluation   Exercise Goals Review Increase Physical Activity;Increase Strenth and Stamina Increase Physical Activity;Increase Strenth and Stamina      Comments Darral feels that rehab has been really beneficial.  He is feeling better overall and has built his strength up using weights.  He is walking outside and at the mall in addition to using his treadmill and recumbent bike at home.  Kei improved his walk test by 229f!      Expected Outcomes Short: Complete post 6MWT and graduate.  Long: Continue to exercise independently. Short: Graduate.  Long: Continue to exercise independently          Discharge Exercise Prescription (Final Exercise Prescription Changes):     Exercise Prescription Changes - 06/03/17 1100      Response to Exercise   Blood Pressure (Admit) 126/60   Blood Pressure (Exercise) 118/58   Blood Pressure (Exit) 124/62   Heart Rate (Admit) 65 bpm   Heart Rate (Exercise) 103 bpm   Heart Rate (Exit) 65 bpm   Perceived Dyspnea (Exercise) 15   Symptoms none   Duration Continue with 45 min of aerobic exercise without signs/symptoms of physical distress.   Intensity THRR unchanged     Progression   Progression Continue to progress workloads to maintain intensity without signs/symptoms of physical distress.   Average METs 2.89     Resistance Training   Training Prescription Yes   Weight 3 lb   Reps 10-15     Interval Training   Interval Training No     Treadmill   MPH 2.5   Grade 2   Minutes 15   METs 3.6     Recumbant Bike   Level 4   Watts 16   Minutes 15  METs 2.56     NuStep   Level 3   Minutes 15   METs 2.5     Home Exercise Plan   Plans to continue exercise at Home (comment)  walk and stationary bike   Frequency Add 2 additional days to program exercise sessions.   Initial Home Exercises Provided 04/28/17      Nutrition:  Target Goals: Understanding of nutrition guidelines, daily intake of sodium <1579m, cholesterol <2070m calories 30% from fat and 7% or less from saturated fats, daily to have 5 or more servings of fruits and vegetables.  Biometrics:     Pre Biometrics - 04/19/17 1519      Pre Biometrics   Height 5' 10.5" (1.791 m)   Weight 176 lb 12.8 oz (80.2 kg)   Waist Circumference 34 inches   Hip Circumference 35.25 inches   Waist to Hip Ratio 0.96 %   BMI (Calculated) 25.1   Single Leg Stand 1.12 seconds         Post Biometrics - 06/14/17 0836       Post  Biometrics   Height 5' 10.5" (1.791 m)   Weight 179 lb (81.2 kg)   Waist Circumference 34.5 inches   Hip Circumference 37.5 inches   Waist  to Hip Ratio 0.92 %   BMI (Calculated) 25.4   Single Leg Stand 2.25 seconds      Nutrition Therapy Plan and Nutrition Goals:     Nutrition Therapy & Goals - 05/24/17 1148      Nutrition Therapy   Diet Instructed patient on heart healthy dietary guidelines based on 2000 calorie diet.   Drug/Food Interactions Statins/Certain Fruits   Protein (specify units) 8   Fiber 20 grams   Whole Grain Foods 3 servings   Saturated Fats 14 max. grams   Fruits and Vegetables 5 servings/day  8-10 ideal   Sodium 2000 grams  less than 1500 ideal     Personal Nutrition Goals   Nutrition Goal To continue to read food labels for saturated fat, trans fat and sodium   Personal Goal #2 To include a protein food with most meals in order to meet goal of 8 oz protein food daily.   Comments Patient has made significant positive diet changes since his CABG surgery and has great support from his daughters regarding his diet and food preparation.     Intervention Plan   Intervention Prescribe, educate and counsel regarding individualized specific dietary modifications aiming towards targeted core components such as weight, hypertension, lipid management, diabetes, heart failure and other comorbidities.;Nutrition handout(s) given to patient.   Expected Outcomes Short Term Goal: Understand basic principles of dietary content, such as calories, fat, sodium, cholesterol and nutrients.;Short Term Goal: A plan has been developed with personal nutrition goals set during dietitian appointment.;Long Term Goal: Adherence to prescribed nutrition plan.      Nutrition Discharge: Rate Your Plate Scores:     Nutrition Assessments - 06/04/17 0923      MEDFICTS Scores   Pre Score 12   Post Score 9   Score Difference -3      Nutrition Goals Re-Evaluation:     Nutrition Goals Re-Evaluation    Row Name 05/14/17 0729 06/04/17 0925           Goals   Nutrition Goal  - To continue to read food labels for saturated  fat, trans fat and sodium, more protien with meals.      Comment EdBrannonas an appt with the  registered dietician this Monday. He said his youngest daughter is cooking for him but can't probably go to the dietician appt since she has taken alot of time off work. She is cooking healthy for me.  Jesper enjoyed his appointment with the dietician.  He has been doing well with his diet.  The hardest part for him has been to eliminate fried foods.  He has learned to like using an cooking meat in his oven.  He is now able to shop condfidently at the grocery store and read labels to know what to stay away from.  He is trying to get more protien in his diet.      Expected Outcome  - Short: Continue to work on eating better.  Long: Maintain a heart healthy diet.         Nutrition Goals Discharge (Final Nutrition Goals Re-Evaluation):     Nutrition Goals Re-Evaluation - 06/04/17 0925      Goals   Nutrition Goal To continue to read food labels for saturated fat, trans fat and sodium, more protien with meals.   Comment Rogan enjoyed his appointment with the dietician.  He has been doing well with his diet.  The hardest part for him has been to eliminate fried foods.  He has learned to like using an cooking meat in his oven.  He is now able to shop condfidently at the grocery store and read labels to know what to stay away from.  He is trying to get more protien in his diet.   Expected Outcome Short: Continue to work on eating better.  Long: Maintain a heart healthy diet.      Psychosocial: Target Goals: Acknowledge presence or absence of significant depression and/or stress, maximize coping skills, provide positive support system. Participant is able to verbalize types and ability to use techniques and skills needed for reducing stress and depression.   Initial Review & Psychosocial Screening:     Initial Psych Review & Screening - 04/19/17 1311      Family Dynamics   Good Support System? Yes      Barriers   Psychosocial barriers to participate in program There are no identifiable barriers or psychosocial needs.     Screening Interventions   Interventions Encouraged to exercise      Quality of Life Scores:      Quality of Life - 06/04/17 0924      Quality of Life Scores   Health/Function Pre 27.23 %   Health/Function Post 27.47 %   Health/Function % Change 0.88 %   Socioeconomic Pre 26.57 %   Socioeconomic Post 26.79 %   Socioeconomic % Change  0.83 %   Psych/Spiritual Pre 29.14 %   Psych/Spiritual Post 28.07 %   Psych/Spiritual % Change -3.67 %   Family Pre 29.38 %   Family Post 27 %   Family % Change -8.1 %   GLOBAL Pre 27.79 %   GLOBAL Post 27.38 %   GLOBAL % Change -1.48 %      PHQ-9: Recent Review Flowsheet Data    Depression screen [facility] 2/9 06/04/2017 04/19/2017 03/16/2017 08/07/2016 05/22/2016   Decreased Interest 3 0 0 0 0   Down, Depressed, Hopeless 0 0 0 0 0   PHQ - 2 Score 3 0 0 0 0   Altered sleeping 0 0 - - -   Tired, decreased energy 0 0 - - -   Change in appetite 0 0 - - -   Feeling bad  or failure about yourself  0 0 - - -   Trouble concentrating 0 0 - - -   Moving slowly or fidgety/restless 0 0 - - -   Suicidal thoughts 0 0 - - -   PHQ-9 Score 3 0 - - -   Difficult doing work/chores Not difficult at all - - - -     Interpretation of Total Score  Total Score Depression Severity:  1-4 = Minimal depression, 5-9 = Mild depression, 10-14 = Moderate depression, 15-19 = Moderately severe depression, 20-27 = Severe depression   Psychosocial Evaluation and Intervention:     Psychosocial Evaluation - 05/31/17 1000      Psychosocial Evaluation & Interventions   Comments Counselor met with Mr. Gatley [place]) today for initial psychosocial evaluation.  He is a 71 year old who had a CABGx3 in April of 2018.  He has a strong support system with (2) daughters and a sister who live close by.  Devin is also actively involved in his local church.  He has  several other health issues with a recent knee replacement and spinal surgery years ago.  Britain sleeps well; has a good appetite; and is typically in a positive mood most of the time.  He denies a history of depression or anxiety or any current symptoms.  He states his health is his only primary stressor at this time and that his full time job is not stressful as he enjoys working.  Zinedine has goals to increase his stamina and strength while in this program and to learn to eat healthier.  He has some gym equipment at home that he will utilize once he completes this program.     Expected Outcomes Aadith will benefit from consistent exercise to achieve his stated goals.  He also will benefit from meeting with the dietician for healthier eating choices.  The educational components of this program will be positive for Nezar to learn how to be manage his medical issues.     Continue Psychosocial Services  Follow up required by staff      Psychosocial Re-Evaluation:     Psychosocial Re-Evaluation    Row Name 05/14/17 [phone] 06/04/17 0929           Psychosocial Re-Evaluation   Current issues with  - Current Stress Concerns      Comments Andree said his granddaughter and daughter are watching over him "like a hawk". He has someone helping him clean too. "My family is doing ok with all of this. No sleep problems.  No major stressors currently. He is sleeping good.  He is eager to get back to to work, but will need to pass a stress test.   He continues to have a stronger support system and positive outlook.        Expected Outcomes  - Short: Continue to maintain positve outlook and graduate.  Long: Get back to work and continue to stay on top of his stressors.      Interventions Encouraged to attend Cardiac Rehabilitation for the exercise Encouraged to attend Cardiac Rehabilitation for the exercise      Continue Psychosocial Services  Follow up required by staff  -        Initial Review   Source of Stress  Concerns  - Chronic Illness         Psychosocial Discharge (Final Psychosocial Re-Evaluation):     Psychosocial Re-Evaluation - 06/04/17 0929      Psychosocial Re-Evaluation   Current  issues with Current Stress Concerns   Comments No major stressors currently. He is sleeping good.  He is eager to get back to to work, but will need to pass a stress test.   He continues to have a stronger support system and positive outlook.     Expected Outcomes Short: Continue to maintain positve outlook and graduate.  Long: Get back to work and continue to stay on top of his stressors.   Interventions Encouraged to attend Cardiac Rehabilitation for the exercise     Initial Review   Source of Stress Concerns Chronic Illness      Vocational Rehabilitation: Provide vocational rehab assistance to qualifying candidates.   Vocational Rehab Evaluation & Intervention:     Vocational Rehab - 04/19/17 1133      Initial Vocational Rehab Evaluation & Intervention   Assessment shows need for Vocational Rehabilitation No      Education: Education Goals: Education classes will be provided on a weekly basis, covering required topics. Participant will state understanding/return demonstration of topics presented.  Learning Barriers/Preferences:     Learning Barriers/Preferences - 04/19/17 1306      Learning Barriers/Preferences   Learning Barriers Reading   Learning Preferences Verbal Instruction;Skilled Demonstration      Education Topics: General Nutrition Guidelines/Fats and Fiber: -Group instruction provided by verbal, written material, models and posters to present the general guidelines for heart healthy nutrition. Gives an explanation and review of dietary fats and fiber.   Cardiac Rehab from 06/14/2017 in [facility] Cardiac and Pulmonary Rehab  Date  05/03/17  Educator  CR  Instruction Review Code  2- meets goals/outcomes      Controlling Sodium/Reading Food Labels: -Group verbal and written  material supporting the discussion of sodium use in heart healthy nutrition. Review and explanation with models, verbal and written materials for utilization of the food label.   Cardiac Rehab from 06/14/2017 in [facility] Cardiac and Pulmonary Rehab  Date  05/10/17  Educator  CR  Instruction Review Code  2- meets goals/outcomes      Exercise Physiology & Risk Factors: - Group verbal and written instruction with models to review the exercise physiology of the cardiovascular system and associated critical values. Details cardiovascular disease risk factors and the goals associated with each risk factor.   Cardiac Rehab from 06/14/2017 in [facility] Cardiac and Pulmonary Rehab  Date  05/17/17  Educator  [facility]  Instruction Review Code  2- meets goals/outcomes      Aerobic Exercise & Resistance Training: - Gives group verbal and written discussion on the health impact of inactivity. On the components of aerobic and resistive training programs and the benefits of this training and how to safely progress through these programs.   Cardiac Rehab from 06/14/2017 in [facility] Cardiac and Pulmonary Rehab  Date  05/19/17  Educator  SB  Instruction Review Code  2- meets goals/outcomes      Flexibility, Balance, General Exercise Guidelines: - Provides group verbal and written instruction on the benefits of flexibility and balance training programs. Provides general exercise guidelines with specific guidelines to those with heart or lung disease. Demonstration and skill practice provided.   Cardiac Rehab from 06/14/2017 in [facility] Cardiac and Pulmonary Rehab  Date  05/24/17  Educator  [facility]  Instruction Review Code  2- meets goals/outcomes      Stress Management: - Provides group verbal and written instruction about the health risks of elevated stress, cause of high stress, and healthy ways to reduce stress.   Cardiac  Rehab from 06/14/2017 in [facility] Cardiac and Pulmonary Rehab  Date  06/02/17  Educator  [facility]  Instruction Review  Code  2- meets goals/outcomes      Depression: - Provides group verbal and written instruction on the correlation between heart/lung disease and depressed mood, treatment options, and the stigmas associated with seeking treatment.   Cardiac Rehab from 06/14/2017 in [facility] Cardiac and Pulmonary Rehab  Date  05/05/17  Educator  [facility]  Instruction Review Code  2- meets goals/outcomes      Anatomy & Physiology of the Heart: - Group verbal and written instruction and models provide basic cardiac anatomy and physiology, with the coronary electrical and arterial systems. Review of: AMI, Angina, Valve disease, Heart Failure, Cardiac Arrhythmia, Pacemakers, and the ICD.   Cardiac Rehab from 06/14/2017 in [facility] Cardiac and Pulmonary Rehab  Date  05/31/17  Educator  CE  Instruction Review Code  2- meets goals/outcomes      Cardiac Procedures: - Group verbal and written instruction and models to describe the testing methods done to diagnose heart disease. Reviews the outcomes of the test results. Describes the treatment choices: Medical Management, Angioplasty, or Coronary Bypass Surgery.   Cardiac Rehab from 06/14/2017 in [facility] Cardiac and Pulmonary Rehab  Date  06/07/17  Educator  CE  Instruction Review Code  2- meets goals/outcomes      Cardiac Medications: - Group verbal and written instruction to review commonly prescribed medications for heart disease. Reviews the medication, class of the drug, and side effects. Includes the steps to properly store meds and maintain the prescription regimen.   Cardiac Rehab from 06/14/2017 in [facility] Cardiac and Pulmonary Rehab  Date  06/14/17 [06/14/17 Part 1]  Educator  CE Marisue Humble 2]  Instruction Review Code  2- meets goals/outcomes      Go Sex-Intimacy & Heart Disease, Get SMART - Goal Setting: - Group verbal and written instruction through game format to discuss heart disease and the return to sexual intimacy. Provides group verbal and written material to discuss  and apply goal setting through the application of the S.M.A.R.T. Method.   Cardiac Rehab from 06/14/2017 in [facility] Cardiac and Pulmonary Rehab  Date  06/07/17  Educator  CE  Instruction Review Code  2- meets goals/outcomes      Other Matters of the Heart: - Provides group verbal, written materials and models to describe Heart Failure, Angina, Valve Disease, and Diabetes in the realm of heart disease. Includes description of the disease process and treatment options available to the cardiac patient.   Cardiac Rehab from 06/14/2017 in [facility] Cardiac and Pulmonary Rehab  Date  05/31/17  Educator  CE  Instruction Review Code  2- meets goals/outcomes      Exercise & Equipment Safety: - Individual verbal instruction and demonstration of equipment use and safety with use of the equipment.   Cardiac Rehab from 06/14/2017 in [facility] Cardiac and Pulmonary Rehab  Date  04/19/17  Educator  C. EnterkinRN  Instruction Review Code  1- partially meets, needs review/practice      Infection Prevention: - Provides verbal and written material to individual with discussion of infection control including proper hand washing and proper equipment cleaning during exercise session.   Cardiac Rehab from 06/14/2017 in [facility] Cardiac and Pulmonary Rehab  Date  04/19/17  Educator  C. EnterkinRN  Instruction Review Code  2- meets goals/outcomes      Falls Prevention: - Provides verbal and written material to individual with discussion of falls prevention and  safety.   Cardiac Rehab from 06/14/2017 in [facility] Cardiac and Pulmonary Rehab  Date  04/19/17  Educator  C. Jim Hogg  Instruction Review Code  2- meets goals/outcomes      Diabetes: - Individual verbal and written instruction to review signs/symptoms of diabetes, desired ranges of glucose level fasting, after meals and with exercise. Advice that pre and post exercise glucose checks will be done for 3 sessions at entry of program.    Knowledge Questionnaire  Score:     Knowledge Questionnaire Score - 06/04/17 0923      Knowledge Questionnaire Score   Pre Score 19/28   Post Score 22/28  reviewed results with patient today      Core Components/Risk Factors/Patient Goals at Admission:     Personal Goals and Risk Factors at Admission - 04/19/17 1309      Core Components/Risk Factors/Patient Goals on Admission   Personal Goal Other Yes   Personal Goal Learn what to eat after his open heart surgery. Find out his lifting restriction from his MD if it is still 3 lbs or less   Intervention Dietician appt made for Jehad. [place] will see his surgeon this week   Expected Outcomes Heart healthy eating. Healing after his CABG surgery with no complications      Core Components/Risk Factors/Patient Goals Review:      Goals and Risk Factor Review    Row Name 05/14/17 0742 06/09/17 0829           Core Components/Risk Factors/Patient Goals Review   Personal Goals Review Other Weight Management/Obesity;Hypertension;Lipids      Review Derrall said his CABG incision is healing from the inside out and he feels it at times but he is doing ok. Daxten reports that he feels "a whole lot better than what he did and the exercise is helping". His daughter gave him a treadmill for his house which he is working out on.  Pasqualino has been gaining some weight.  He is feeling better!  He check his blood pressures regularly at home and they have been good. He does not have any problems with his medications.        Expected Outcomes Heart healthy lifestyle Short: Try to lose weight.  Long: Continue to work on risk factor modifications.          Core Components/Risk Factors/Patient Goals at Discharge (Final Review):      Goals and Risk Factor Review - 06/09/17 0829      Core Components/Risk Factors/Patient Goals Review   Personal Goals Review Weight Management/Obesity;Hypertension;Lipids   Review Jowel has been gaining some weight.  He is feeling better!  He check  his blood pressures regularly at home and they have been good. He does not have any problems with his medications.     Expected Outcomes Short: Try to lose weight.  Long: Continue to work on risk factor modifications.       ITP Comments:     ITP Comments    Row Name 04/19/17 1301 04/28/17 0651 05/26/17 0630       ITP Comments ITP created during Medical Review after Cardiac Rehab informed consent was signed. DX listed in CHL/EPIC 30 day review. Continue with ITP unless directed changes per Medical Director review. New to program 30 day review. Continue with ITP unless directed changes per Medical Director review           Comments: Discharge ITP

## 2017-06-14 NOTE — Progress Notes (Signed)
Daily Session Note  Patient Details  Name: Reichen Hutzler MRN: [id number] Date of Birth: 1946-09-22 Referring Provider:     Cardiac Rehab from 04/19/2017 in [facility] Cardiac and Pulmonary Rehab  Referring Provider  [name] MD      Encounter Date: 06/14/2017  Check In:     Session Check In - 06/14/17 0751      Check-In   Location [facility]-Cardiac & Pulmonary Rehab   Staff Present Gerlene Burdock, RN, Levie Heritage, MA, ACSM RCEP, Exercise Physiologist;Kelly Amedeo Plenty, BS, ACSM CEP, Exercise Physiologist   Supervising physician immediately available to respond to emergencies See telemetry face sheet for immediately available ER MD   Medication changes reported     No   Fall or balance concerns reported    No   Warm-up and Cool-down Performed on first and last piece of equipment   Resistance Training Performed Yes   VAD Patient? No     Pain Assessment   Currently in Pain? No/denies   Multiple Pain Sites No         History  Smoking Status  . Former Smoker  . Packs/day: 1.50  . Years: 35.00  . Types: Cigarettes  . Quit date: 04/09/2005  Smokeless Tobacco  . Never Used    Goals Met:  Independence with exercise equipment Exercise tolerated well Personal goals reviewed No report of cardiac concerns or symptoms Strength training completed today  Goals Unmet:  Not Applicable  Comments:  Reegan graduated today from cardiac rehab with 36 sessions completed.  Details of the patient's exercise prescription and what He needs to do in order to continue the prescription and progress were discussed with patient.  Patient was given a copy of prescription and goals.  Patient verbalized understanding.  Ramez plans to continue to exercise by walking and using stationary bike at home.    Dr. Emily Filbert is Medical Director for [place] and [name].

## 2017-06-16 ENCOUNTER — Ambulatory Visit: Payer: 59 | Admitting: Family Medicine

## 2017-06-22 ENCOUNTER — Encounter: Payer: Self-pay | Admitting: Family Medicine

## 2017-06-22 ENCOUNTER — Ambulatory Visit (INDEPENDENT_AMBULATORY_CARE_PROVIDER_SITE_OTHER): Payer: 59 | Admitting: Family Medicine

## 2017-06-22 VITALS — BP 114/68 | HR 87 | Temp 98.2°F | Resp 16 | Ht 71.0 in | Wt 184.4 lb

## 2017-06-22 DIAGNOSIS — I255 Ischemic cardiomyopathy: Secondary | ICD-10-CM

## 2017-06-22 DIAGNOSIS — E785 Hyperlipidemia, unspecified: Secondary | ICD-10-CM | POA: Diagnosis not present

## 2017-06-22 NOTE — Progress Notes (Signed)
Name: Seth Byrd   MRN: [id number]    DOB: June 14, 1946   Date:06/22/2017       Progress Note  Subjective  Chief Complaint  Chief Complaint  Patient presents with  . Follow-up    3 mo  . Hyperlipidemia    Hyperlipidemia  This is a chronic problem. The problem is controlled. Recent lipid tests were reviewed and are normal. Pertinent negatives include no leg pain, myalgias or shortness of breath. Current antihyperlipidemic treatment includes statins.    Past Medical History:  Diagnosis Date  . Arthritis   . Hypertension     Past Surgical History:  Procedure Laterality Date  . BACK SURGERY     Spinal Fusion with 2 screws  . CORONARY ARTERY BYPASS GRAFT N/A 02/10/2017   Procedure: CORONARY ARTERY BYPASS GRAFTING (CABG), ON PUMP, TIMES THREE, USING LEFT INTERNAL MAMMARY ARTERY AND ENDOSCOPICALLY HARVESTED BILATERAL GREATER SAPHENOUS VEINS;  Surgeon: [name], MD;  Location: [facility];  Service: Open Heart Surgery;  Laterality: N/A;  LIMA to LAD SVG to Diag 1 SVG to OM1  . JOINT REPLACEMENT    . LEFT HEART CATH AND CORONARY ANGIOGRAPHY N/A 02/09/2017   Procedure: Left Heart Cath and Coronary Angiography;  Surgeon: [name], MD;  Location: [place] [facility];  Service: Cardiovascular;  Laterality: N/A;  . TEE WITHOUT CARDIOVERSION N/A 02/10/2017   Procedure: TRANSESOPHAGEAL ECHOCARDIOGRAM (TEE);  Surgeon: [name], MD;  Location: [facility];  Service: Open Heart Surgery;  Laterality: N/A;  . TOTAL KNEE ARTHROPLASTY Left 04/23/2015   Procedure: TOTAL KNEE ARTHROPLASTY;  Surgeon: Christophe Louis, MD;  Location: [facility];  Service: Orthopedics;  Laterality: Left;    Family History  Problem Relation Age of Onset  . Hypertension Mother   . Transient ischemic attack Mother   . CVA Mother   . Colon cancer Father   . Hypertension Father   . Prostate cancer Father   . Diabetes Brother   . Kidney cancer Neg Hx   . Bladder Cancer Neg Hx     Social History   Social  History  . Marital status: Married    Spouse name: N/A  . Number of children: N/A  . Years of education: N/A   Occupational History  . Not on file.   Social History Main Topics  . Smoking status: Former Smoker    Packs/day: 1.50    Years: 35.00    Types: Cigarettes    Quit date: 04/09/2005  . Smokeless tobacco: Never Used  . Alcohol use No  . Drug use: No  . Sexual activity: Not on file   Other Topics Concern  . Not on file   Social History Narrative  . No narrative on file     Current Outpatient Prescriptions:  .  acetaminophen (TYLENOL) 500 MG tablet, Take 500 mg by mouth every 6 (six) hours as needed., Disp: , Rfl:  .  aspirin EC 325 MG EC tablet, Take 1 tablet (325 mg total) by mouth daily., Disp: 30 tablet, Rfl: 0 .  atorvastatin (LIPITOR) 40 MG tablet, Take 1 tablet (40 mg total) by mouth daily at 6 PM., Disp: , Rfl:  .  carvedilol (COREG) 12.5 MG tablet, Take 1 tablet (12.5 mg total) by mouth 2 (two) times daily with a meal., Disp: 60 tablet, Rfl: 3 .  Emollient (CERAVE) CREA, Apply topically., Disp: , Rfl:  .  etanercept (ENBREL) 50 MG/ML injection, Inject 50 mg into the skin once a week. Saturday,  Disp: , Rfl:  .  furosemide (LASIX) 40 MG tablet, Take 1 tablet (40 mg total) by mouth daily., Disp: 30 tablet, Rfl:  .  Multiple Vitamins-Minerals (ONE-A-DAY MENS 50+ ADVANTAGE PO), Take 1 tablet by mouth daily., Disp: , Rfl:  .  Omega-3 Fatty Acids (FISH OIL) 1200 MG CAPS, Take 1 capsule by mouth 2 (two) times daily. , Disp: , Rfl:  .  sacubitril-valsartan (ENTRESTO) 24-26 MG, Take 1 tablet by mouth 2 (two) times daily., Disp: 60 tablet, Rfl: 11 .  spironolactone (ALDACTONE) 25 MG tablet, Take 1 tablet (25 mg total) by mouth daily., Disp: , Rfl:  .  vitamin B-12 (CYANOCOBALAMIN) 1000 MCG tablet, Take 1,000 mcg by mouth daily. , Disp: , Rfl:   No Known Allergies   Review of Systems  Respiratory: Negative for shortness of breath.   Musculoskeletal: Negative for  myalgias.     Objective  Vitals:   06/22/17 1014  BP: 114/68  Pulse: 87  Resp: 16  Temp: 98.2 F (36.8 C)  TempSrc: Oral  SpO2: 96%  Weight: 184 lb 6.4 oz (83.6 kg)  Height: 5\' 11"  (1.803 m)    Physical Exam  Constitutional: He is oriented to person, place, and time and well-developed, well-nourished, and in no distress.  HENT:  Head: Normocephalic and atraumatic.  Cardiovascular: Normal rate, regular rhythm and normal heart sounds.   No murmur heard. Pulmonary/Chest: Effort normal and breath sounds normal. He has no wheezes.  Musculoskeletal: He exhibits no edema.  Neurological: He is alert and oriented to person, place, and time.  Psychiatric: Mood, memory, affect and judgment normal.  Nursing note and vitals reviewed.      Assessment & Plan  1. Ischemic cardiomyopathy Being followed by cardiology, on appropriate medications.  2. Hyperlipidemia, unspecified hyperlipidemia type On atorvastatin 40 mg daily, obtain FLP and adjust as appropriate - Lipid panel - COMPLETE METABOLIC PANEL WITH GFR   Qianna Clagett Asad A. [facility] 06/22/2017 10:26 AM

## 2017-06-23 ENCOUNTER — Other Ambulatory Visit: Payer: Self-pay | Admitting: Family Medicine

## 2017-06-24 ENCOUNTER — Telehealth (HOSPITAL_COMMUNITY): Payer: Self-pay | Admitting: *Deleted

## 2017-06-24 ENCOUNTER — Telehealth (HOSPITAL_COMMUNITY): Payer: Self-pay

## 2017-06-24 ENCOUNTER — Ambulatory Visit (INDEPENDENT_AMBULATORY_CARE_PROVIDER_SITE_OTHER): Payer: 59 | Admitting: Cardiothoracic Surgery

## 2017-06-24 ENCOUNTER — Other Ambulatory Visit (HOSPITAL_COMMUNITY): Payer: Self-pay | Admitting: Cardiology

## 2017-06-24 ENCOUNTER — Encounter: Payer: Self-pay | Admitting: Cardiothoracic Surgery

## 2017-06-24 ENCOUNTER — Ambulatory Visit: Payer: 59 | Admitting: Cardiothoracic Surgery

## 2017-06-24 VITALS — BP 119/77 | HR 71 | Resp 20 | Ht 71.0 in | Wt 182.0 lb

## 2017-06-24 DIAGNOSIS — I2510 Atherosclerotic heart disease of native coronary artery without angina pectoris: Secondary | ICD-10-CM

## 2017-06-24 DIAGNOSIS — Z951 Presence of aortocoronary bypass graft: Secondary | ICD-10-CM | POA: Diagnosis not present

## 2017-06-24 DIAGNOSIS — I255 Ischemic cardiomyopathy: Secondary | ICD-10-CM

## 2017-06-24 LAB — COMPREHENSIVE METABOLIC PANEL
A/G RATIO: 1.4 (ref 1.2–2.2)
ALBUMIN: 4.5 g/dL (ref 3.5–4.8)
ALT: 37 IU/L (ref 0–44)
AST: 25 IU/L (ref 0–40)
Alkaline Phosphatase: 121 IU/L — ABNORMAL HIGH (ref 39–117)
BILIRUBIN TOTAL: 0.9 mg/dL (ref 0.0–1.2)
BUN / CREAT RATIO: 19 (ref 10–24)
BUN: 21 mg/dL (ref 8–27)
CALCIUM: 9.8 mg/dL (ref 8.6–10.2)
CHLORIDE: 104 mmol/L (ref 96–106)
CO2: 20 mmol/L (ref 20–29)
Creatinine, Ser: 1.1 mg/dL (ref 0.76–1.27)
GFR, EST AFRICAN AMERICAN: 78 mL/min/{1.73_m2} (ref >59)
GFR, EST NON AFRICAN AMERICAN: 68 mL/min/{1.73_m2} (ref >59)
GLOBULIN, TOTAL: 3.3 g/dL (ref 1.5–4.5)
Glucose: 108 mg/dL — ABNORMAL HIGH (ref 65–99)
POTASSIUM: 4.1 mmol/L (ref 3.5–5.2)
Sodium: 138 mmol/L (ref 134–144)
TOTAL PROTEIN: 7.8 g/dL (ref 6.0–8.5)

## 2017-06-24 LAB — LIPID PANEL W/O CHOL/HDL RATIO
Cholesterol, Total: 117 mg/dL (ref 100–199)
HDL: 35 mg/dL — AB (ref >39)
LDL Calculated: 64 mg/dL (ref 0–99)
Triglycerides: 88 mg/dL (ref 0–149)
VLDL Cholesterol Cal: 18 mg/dL (ref 5–40)

## 2017-06-24 MED ORDER — SACUBITRIL-VALSARTAN 24-26 MG PO TABS: 1.0000 | ORAL_TABLET | Freq: Two times a day (BID) | ORAL | 3 refills | Status: DC

## 2017-06-24 NOTE — Telephone Encounter (Signed)
Pt left VM on triage line earlier this afternoon stating one of his medications cost $500 and if we sent in for 3 months supply it would be cheaper.    Attempted to return call to pt to f/u and Left message to call back

## 2017-06-24 NOTE — Telephone Encounter (Signed)
Seth Byrd with Dr. Everrett Byrd office called to follow up on our plan for patient's follow up apt. Advised per our records patient was on recall list for apt due on/around 08/24/17 for a 6 month follow up with [name]. Nothing further needed.  Seth Pain, RN

## 2017-06-24 NOTE — Progress Notes (Signed)
[address]       [place] [address]             [phone]      Aland Howse [place] Medical Record #[id number] Date of Birth: Jul 05, 1946  Referring: Roselee Nova, MD Primary Care: Roselee Nova, MD  Chief Complaint:   POST OP FOLLOW UP 02/10/2017 OPERATIVE REPORT PREOPERATIVE DIAGNOSES:  Severe ischemic cardiomyopathy with left main and severe 2 vessel coronary artery disease, moderate mitral regurgitation. POSTOPERATIVE DIAGNOSES:  Severe ischemic cardiomyopathy with left main and severe 2 vessel coronary artery disease, moderate mitral regurgitation. SURGICAL PROCEDURE:  Coronary artery bypass grafting x3 with the left internal mammary to the left anterior descending coronary artery, reversed saphenous vein graft to the diagonal coronary artery, reversed saphenous vein graft to the circumflex coronary artery with bilateral thigh endo vein harvesting of the greater saphenous vein and opened left lower leg greater saphenous vein harvesting.Placement of right femorl aline with sonosite guidance  SURGEON:  Lanelle Bal, MD.  History of Present Illness:     Patient returns today after coronary artery bypass grafting done emergently on April 4, in patient with severe ischemic cardiomyopathy. Preoperatively the patient was noted to have a 20/25 percent injection fraction. Since discharge patient has been doing extremely well. He was actively engaged in cardiac rehabilitation, he was without symptoms of angina or congestive heart failure. Says he feels much better than he did even before surgery. 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 He has been placed on Entresto and taken off of losartan. He is tolerating this regimen well. His carvedilol has been adjusted. Plan here was to do a follow-up echo to determine  his LV function has improved to guide consideration for AICD therapy. He is scheduled for an echocardiogram at [facility] later next month. Will defer to this treatment. He is doing well at present. No clinical evidence of heart failure.   He has not had a follow-up echocardiogram postop yet.     Past Medical History:  Diagnosis Date  . Arthritis   . Hypertension      History  Smoking Status  . Former Smoker  . Packs/day: 1.50  . Years: 35.00  . Types: Cigarettes  . Quit date: 04/09/2005  Smokeless Tobacco  . Never Used    History  Alcohol Use No     No Known Allergies  Current Outpatient Prescriptions  Medication Sig Dispense Refill  . acetaminophen (TYLENOL) 500 MG tablet Take 500 mg by mouth every 6 (six) hours as needed.    Marland Kitchen aspirin EC 325 MG EC tablet Take 1 tablet (325 mg total) by mouth daily. 30 tablet 0  . atorvastatin (LIPITOR) 40 MG tablet Take 1 tablet (40 mg total) by mouth daily at 6 PM.    . carvedilol (COREG) 12.5 MG tablet Take 1 tablet (12.5 mg total) by mouth 2 (two) times daily with a meal. 60 tablet 3  . Emollient (CERAVE) CREA Apply topically.    Marland Kitchen etanercept (ENBREL) 50 MG/ML injection Inject 50 mg into the skin once a week. Saturday    . furosemide (LASIX) 40 MG tablet Take 1 tablet (40 mg total) by mouth daily. 30 tablet   . Multiple Vitamins-Minerals (ONE-A-DAY MENS 50+ ADVANTAGE PO) Take 1  tablet by mouth daily.    . Omega-3 Fatty Acids (FISH OIL) 1200 MG CAPS Take 1 capsule by mouth 2 (two) times daily.     . sacubitril-valsartan (ENTRESTO) 24-26 MG Take 1 tablet by mouth 2 (two) times daily. 60 tablet 11  . spironolactone (ALDACTONE) 25 MG tablet Take 1 tablet (25 mg total) by mouth daily.    . vitamin B-12 (CYANOCOBALAMIN) 1000 MCG tablet Take 1,000 mcg by mouth daily.      No current facility-administered medications for this visit.        Physical Exam: BP 119/77   Pulse 71   Resp 20   Ht 5\' 11"  (1.803 m)   Wt 182 lb (82.6 kg)    SpO2 99% Comment: RA  BMI 25.38 kg/m   General appearance: alert, cooperative and no distress Neurologic: intact Heart: regular rate and rhythm, S1, S2 normal, no murmur, click, rub or gallop Lungs: clear to auscultation bilaterally Abdomen: soft, non-tender; bowel sounds normal; no masses,  no organomegaly Extremities: extremities normal, atraumatic, no cyanosis or edema and Homans sign is negative, no sign of DVT Wound: Sternum is stable and well-healed   Diagnostic Studies & Laboratory data:     Recent Radiology Findings:   No results found.    Recent Lab Findings: Lab Results  Component Value Date   WBC 6.6 02/25/2017   HGB 8.8 (L) 02/25/2017   HCT 27.5 (L) 02/25/2017   PLT 473 (H) 02/25/2017   GLUCOSE 108 (H) 06/23/2017   CHOL 117 06/23/2017   TRIG 88 06/23/2017   HDL 35 (L) 06/23/2017   LDLCALC 64 06/23/2017   ALT 37 06/23/2017   AST 25 06/23/2017   NA 138 06/23/2017   K 4.1 06/23/2017   CL 104 06/23/2017   CREATININE 1.10 06/23/2017   BUN 21 06/23/2017   CO2 20 06/23/2017   TSH 2.560 11/25/2015   INR 1.51 02/10/2017   HGBA1C 5.0 02/10/2017      Assessment / Plan:      Patient with significant ischemic cardiomyopathy, ejection fraction prior to surgery 20-25% by echo. 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  I suggested to him that he contact Dr. Ubaldo Glassing Office to get his follow-up echocardiogram before he sees Dr. Ubaldo Glassing in late September.  Overall am very pleased with his progress and suspect on how well he is doing clinically that his echo will show improved LV function, I've not made him a return appointment in the surgical office but be the glad to see him at his or cardiology request.  [name] MD      [address] [place] [address] Office [phone]   Beeper  [phone]  06/24/2017 10:31 AM

## 2017-07-01 ENCOUNTER — Ambulatory Visit: Payer: 59 | Admitting: Cardiothoracic Surgery

## 2017-07-15 ENCOUNTER — Telehealth (HOSPITAL_COMMUNITY): Payer: Self-pay | Admitting: Pharmacist

## 2017-07-15 NOTE — Telephone Encounter (Signed)
Mr. Fulco called stating Leslie was telling him that his Entresto required a PA. I attempted to submit a PA to [facility] and received a fax back stating that the medication did not need a PA but he did need to use [facility] mail order pharmacy for this medication. I have called [facility] mail order pharmacy and asked them to fill it for him. Relayed this info to Mr. Boyar and asked him to call me back if his copay is >$10/mo.   [name], PharmD, BCPS, CPP Clinical Pharmacist Pager: [phone] Phone: [phone] 07/15/2017 12:41 PM

## 2017-07-16 NOTE — Telephone Encounter (Signed)
Called [facility] mail order pharmacy who stated that they are shipping out [name] today which is a $0 copay and he should be receiving it in 3-5 business days. Called and left VM for Seth Byrd with this info.   [name], PharmD, BCPS, CPP Clinical Pharmacist Pager: [phone] Phone: [phone] 07/16/2017 9:26 AM

## 2017-08-09 ENCOUNTER — Encounter: Payer: 59 | Admitting: Family Medicine

## 2017-08-09 ENCOUNTER — Telehealth: Payer: Self-pay | Admitting: Family Medicine

## 2017-08-13 ENCOUNTER — Encounter: Payer: Self-pay | Admitting: Family Medicine

## 2017-08-13 ENCOUNTER — Ambulatory Visit (INDEPENDENT_AMBULATORY_CARE_PROVIDER_SITE_OTHER): Payer: 59 | Admitting: Family Medicine

## 2017-08-13 VITALS — BP 112/80 | HR 70 | Ht 71.0 in | Wt 179.9 lb

## 2017-08-13 DIAGNOSIS — Z0000 Encounter for general adult medical examination without abnormal findings: Secondary | ICD-10-CM

## 2017-08-13 DIAGNOSIS — Z1159 Encounter for screening for other viral diseases: Secondary | ICD-10-CM | POA: Diagnosis not present

## 2017-08-13 DIAGNOSIS — Z23 Encounter for immunization: Secondary | ICD-10-CM | POA: Diagnosis not present

## 2017-08-13 NOTE — Progress Notes (Signed)
Name: Seth Byrd   MRN: [id number]    DOB: Apr 17, 1946   Date:08/13/2017       Progress Note  Subjective  Chief Complaint  Chief Complaint  Patient presents with  . Annual Exam  . Immunizations    HPI  Pt. Presents for a Complete Physical Exam.  Has history of low-grade prostate cancer, followed by Dr. Junious Silk. His last colonoscopy was reportedly in 2012, repeat in 2022, no records available but gastroenterologist will call him when its time.  He is due for Hep C screening.    Past Medical History:  Diagnosis Date  . Arthritis   . Hypertension     Past Surgical History:  Procedure Laterality Date  . BACK SURGERY     Spinal Fusion with 2 screws  . 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 JOINT REPLACEMENT    . LEFT HEART CATH AND CORONARY ANGIOGRAPHY N/A 02/09/2017   Procedure: Left Heart Cath and Coronary Angiography;  Surgeon: [name], MD;  Location: [facility];  Service: Cardiovascular;  Laterality: N/A;  . TEE WITHOUT CARDIOVERSION N/A 02/10/2017   Procedure: TRANSESOPHAGEAL ECHOCARDIOGRAM (TEE);  Surgeon: [name], MD;  Location: [facility];  Service: Open Heart Surgery;  Laterality: N/A;  . TOTAL KNEE ARTHROPLASTY Left 04/23/2015   Procedure: TOTAL KNEE ARTHROPLASTY;  Surgeon: Christophe Louis, MD;  Location: [facility];  Service: Orthopedics;  Laterality: Left;    Family History  Problem Relation Age of Onset  . Hypertension Mother   . Transient ischemic attack Mother   . CVA Mother   . Colon cancer Father   . Hypertension Father   . Prostate cancer Father   . Diabetes Brother   . Kidney cancer Neg Hx   . Bladder Cancer Neg Hx      Social History   Social History  . Marital status: Married    Spouse name: N/A  . Number of children: N/A  . Years of education: N/A   Occupational History  . Not on file.   Social History Main Topics  . Smoking status: Former Smoker    Packs/day: 1.50    Years: 35.00    Types: Cigarettes    Quit date: 04/09/2005  . Smokeless tobacco: Never Used  . Alcohol use No  . Drug use: No  . Sexual activity: Not on file   Other Topics Concern  . Not on file   Social History Narrative  . No narrative on file     Current Outpatient Prescriptions:  .  acetaminophen (TYLENOL) 500 MG tablet, Take 500 mg by mouth every 6 (six) hours as needed., Disp: , Rfl:  .  aspirin EC 325 MG EC tablet, Take 1 tablet (325 mg total) by mouth daily., Disp: 30 tablet, Rfl: 0 .  atorvastatin (LIPITOR) 40 MG tablet, Take 1 tablet (40 mg total) by mouth daily at 6 PM., Disp: , Rfl:  .  carvedilol (COREG) 12.5 MG tablet, Take 1 tablet (12.5 mg total) by mouth 2 (two) times daily with a meal., Disp: 60 tablet, Rfl: 3 .  Emollient (CERAVE) CREA, Apply topically., Disp: , Rfl:  .  ENBREL SURECLICK 50 MG/ML injection, ,  Disp: , Rfl:  .  furosemide (LASIX) 40 MG tablet, Take 1 tablet (40 mg total) by mouth daily., Disp: 30 tablet, Rfl:  .  Multiple Vitamins-Minerals (ONE-A-DAY MENS 50+ ADVANTAGE PO), Take 1 tablet by mouth daily., Disp: , Rfl:  .  Omega-3 Fatty Acids (FISH OIL) 1200 MG CAPS, Take 1 capsule by mouth 2 (two) times daily. , Disp: , Rfl:  .  sacubitril-valsartan (ENTRESTO) 24-26 MG, Take 1 tablet by mouth 2 (two) times daily., Disp: 180 tablet, Rfl: 3 .  spironolactone (ALDACTONE) 25 MG tablet, Take 1 tablet (25 mg total) by mouth daily., Disp: , Rfl:  .  vitamin B-12 (CYANOCOBALAMIN) 1000 MCG tablet, Take 1,000 mcg by mouth daily. , Disp: , Rfl:   No Known Allergies   Review of Systems  Constitutional: Negative for chills, diaphoresis, fever and malaise/fatigue.  HENT: Negative for  congestion, ear pain and sore throat.   Eyes: Negative for blurred vision and double vision.  Respiratory: Negative for cough, sputum production and shortness of breath.   Cardiovascular: Negative for chest pain, palpitations and leg swelling.  Gastrointestinal: Negative for abdominal pain, blood in stool, constipation, diarrhea, nausea and vomiting.  Genitourinary: Negative for dysuria and hematuria.  Musculoskeletal: Negative for back pain and neck pain.  Skin: Negative for itching.  Neurological: Negative for dizziness and headaches.  Psychiatric/Behavioral: Negative for depression. The patient is not nervous/anxious and does not have insomnia.       Objective  Vitals:   08/13/17 0847  BP: 112/80  Pulse: 70  SpO2: 94%  Weight: 179 lb 14.4 oz (81.6 kg)  Height: 5\' 11"  (1.803 m)    Physical Exam  Constitutional: He is oriented to person, place, and time and well-developed, well-nourished, and in no distress.  HENT:  Head: Normocephalic and atraumatic.  Right Ear: External ear normal.  Left Ear: External ear normal.  Mouth/Throat: Oropharynx is clear and moist.  Eyes: Pupils are equal, round, and reactive to light.  Neck: Neck supple.  Cardiovascular: Normal rate, regular rhythm and normal heart sounds.   No murmur heard. Pulmonary/Chest: Effort normal and breath sounds normal. He has no wheezes.  Abdominal: Soft. Bowel sounds are normal. There is no tenderness.  Musculoskeletal: He exhibits no edema.  Neurological: He is alert and oriented to person, place, and time.  Psychiatric: Mood, memory, affect and judgment normal.  Nursing note and vitals reviewed.    Recent Results (from the past 2160 hour(s))  Comprehensive metabolic panel     Status: Abnormal   Collection Time: 06/23/17  9:22 AM  Result Value Ref Range   Glucose 108 (H) 65 - 99 mg/dL   BUN 21 8 - 27 mg/dL   Creatinine, Ser 1.10 0.76 - 1.27 mg/dL   GFR calc non Af Amer 68 >59 mL/min/1.73   GFR calc Af  Amer 78 >59 mL/min/1.73   BUN/Creatinine Ratio 19 10 - 24   Sodium 138 134 - 144 mmol/L   Potassium 4.1 3.5 - 5.2 mmol/L   Chloride 104 96 - 106 mmol/L   CO2 20 20 - 29 mmol/L   Calcium 9.8 8.6 - 10.2 mg/dL   Total Protein 7.8 6.0 - 8.5 g/dL   Albumin 4.5 3.5 - 4.8 g/dL   Globulin, Total 3.3 1.5 - 4.5 g/dL   Albumin/Globulin Ratio 1.4 1.2 - 2.2   Bilirubin Total 0.9 0.0 - 1.2 mg/dL   Alkaline Phosphatase 121 (H) 39 - 117 IU/L   AST 25 0 - 40 IU/L  ALT 37 0 - 44 IU/L  Lipid Panel w/o Chol/HDL Ratio     Status: Abnormal   Collection Time: 06/23/17  9:22 AM  Result Value Ref Range   Cholesterol, Total 117 100 - 199 mg/dL   Triglycerides 88 0 - 149 mg/dL   HDL 35 (L) >39 mg/dL   VLDL Cholesterol Cal 18 5 - 40 mg/dL   LDL Calculated 64 0 - 99 mg/dL     Assessment & Plan  1. Flu vaccine need  - Flu vaccine HIGH DOSE PF (Fluzone High dose)  2. Annual physical exam Obtain age-appropriate laboratory screening - CBC with Differential/Platelet - TSH - VITAMIN D 25 Hydroxy (Vit-D Deficiency, Fractures)  3. Need for hepatitis C screening test  - Hepatitis C antibody  Phinneas Shakoor [facility] 08/13/2017 8:57 AM

## 2017-08-14 LAB — CBC WITH DIFFERENTIAL/PLATELET
BASOS ABS: 0 10*3/uL (ref 0.0–0.2)
Basos: 1 %
EOS (ABSOLUTE): 0.2 10*3/uL (ref 0.0–0.4)
Eos: 4 %
HEMOGLOBIN: 13.1 g/dL (ref 13.0–17.7)
Hematocrit: 38 % (ref 37.5–51.0)
IMMATURE GRANS (ABS): 0 10*3/uL (ref 0.0–0.1)
IMMATURE GRANULOCYTES: 0 %
LYMPHS: 48 %
Lymphocytes Absolute: 2.1 10*3/uL (ref 0.7–3.1)
MCH: 32.5 pg (ref 26.6–33.0)
MCHC: 34.5 g/dL (ref 31.5–35.7)
MCV: 94 fL (ref 79–97)
MONOCYTES: 11 %
Monocytes Absolute: 0.5 10*3/uL (ref 0.1–0.9)
NEUTROS ABS: 1.6 10*3/uL (ref 1.4–7.0)
NEUTROS PCT: 36 %
PLATELETS: 197 10*3/uL (ref 150–379)
RBC: 4.03 x10E6/uL — AB (ref 4.14–5.80)
RDW: 11.8 % — ABNORMAL LOW (ref 12.3–15.4)
WBC: 4.3 10*3/uL (ref 3.4–10.8)

## 2017-08-14 LAB — TSH: TSH: 1.46 u[IU]/mL (ref 0.450–4.500)

## 2017-08-14 LAB — VITAMIN D 25 HYDROXY (VIT D DEFICIENCY, FRACTURES): VIT D 25 HYDROXY: 39.2 ng/mL (ref 30.0–100.0)

## 2017-08-14 LAB — HEPATITIS C ANTIBODY: HEP C VIRUS AB: 0.4 {s_co_ratio} (ref 0.0–0.9)

## 2017-08-17 ENCOUNTER — Other Ambulatory Visit (HOSPITAL_COMMUNITY): Payer: Self-pay | Admitting: Pharmacist

## 2017-08-17 ENCOUNTER — Telehealth: Payer: Self-pay

## 2017-08-17 ENCOUNTER — Telehealth (HOSPITAL_COMMUNITY): Payer: Self-pay | Admitting: Internal Medicine

## 2017-08-17 MED ORDER — CARVEDILOL 12.5 MG PO TABS: 12.5000 mg | ORAL_TABLET | Freq: Two times a day (BID) | ORAL | 5 refills | Status: DC

## 2017-08-17 NOTE — Telephone Encounter (Signed)
-----   Message from Roselee Nova, MD sent at 08/16/2017  9:53 AM EDT ----- CBC shows normal white count, hemoglobin, hematocrit and platelets Vitamin D is within normal range TSH is within normal range Hep C is negative

## 2017-08-17 NOTE — Telephone Encounter (Signed)
Called pt informed him of normal labs, pt gave verbal understanding.  

## 2017-08-17 NOTE — Telephone Encounter (Signed)
Left message for patient call back, need to schedule appt with DB from recall.

## 2017-08-18 NOTE — Telephone Encounter (Signed)
Patient returned my call.  He is scheduled 10/07/17 @2  pm with Dr. Haroldine Laws.

## 2017-08-19 NOTE — Telephone Encounter (Signed)
errenous °

## 2017-09-24 ENCOUNTER — Ambulatory Visit: Payer: 59 | Admitting: Family Medicine

## 2017-09-24 ENCOUNTER — Encounter: Payer: Self-pay | Admitting: Family Medicine

## 2017-09-24 VITALS — BP 126/82 | HR 71 | Temp 98.3°F | Resp 18 | Ht 71.0 in | Wt 180.7 lb

## 2017-09-24 DIAGNOSIS — I2510 Atherosclerotic heart disease of native coronary artery without angina pectoris: Secondary | ICD-10-CM | POA: Diagnosis not present

## 2017-09-24 DIAGNOSIS — E785 Hyperlipidemia, unspecified: Secondary | ICD-10-CM | POA: Diagnosis not present

## 2017-09-24 DIAGNOSIS — Z23 Encounter for immunization: Secondary | ICD-10-CM

## 2017-09-24 DIAGNOSIS — R7301 Impaired fasting glucose: Secondary | ICD-10-CM

## 2017-09-24 DIAGNOSIS — Z951 Presence of aortocoronary bypass graft: Secondary | ICD-10-CM | POA: Diagnosis not present

## 2017-09-24 DIAGNOSIS — I5022 Chronic systolic (congestive) heart failure: Secondary | ICD-10-CM

## 2017-09-24 DIAGNOSIS — Z1212 Encounter for screening for malignant neoplasm of rectum: Secondary | ICD-10-CM

## 2017-09-24 DIAGNOSIS — I10 Essential (primary) hypertension: Secondary | ICD-10-CM

## 2017-09-24 DIAGNOSIS — Z1211 Encounter for screening for malignant neoplasm of colon: Secondary | ICD-10-CM

## 2017-09-24 NOTE — Patient Instructions (Addendum)
DASH Eating Plan DASH stands for "Dietary Approaches to Stop Hypertension." The DASH eating plan is a healthy eating plan that has been shown to reduce high blood pressure (hypertension). It may also reduce your risk for type 2 diabetes, heart disease, and stroke. The DASH eating plan may also help with weight loss. What are tips for following this plan? General guidelines  Avoid eating more than 2,300 mg (milligrams) of salt (sodium) a day. If you have hypertension, you may need to reduce your sodium intake to 1,500 mg a day.  Limit alcohol intake to no more than 1 drink a day for nonpregnant women and 2 drinks a day for men. One drink equals 12 oz of beer, 5 oz of wine, or 1 oz of hard liquor.  Work with your health care provider to maintain a healthy body weight or to lose weight. Ask what an ideal weight is for you.  Get at least 30 minutes of exercise that causes your heart to beat faster (aerobic exercise) most days of the week. Activities may include walking, swimming, or biking.  Work with your health care provider or diet and nutrition specialist (dietitian) to adjust your eating plan to your individual calorie needs. Reading food labels  Check food labels for the amount of sodium per serving. Choose foods with less than 5 percent of the Daily Value of sodium. Generally, foods with less than 300 mg of sodium per serving fit into this eating plan.  To find whole grains, look for the word "whole" as the first word in the ingredient list. Shopping  Buy products labeled as "low-sodium" or "no salt added."  Buy fresh foods. Avoid canned foods and premade or frozen meals. Cooking  Avoid adding salt when cooking. Use salt-free seasonings or herbs instead of table salt or sea salt. Check with your health care provider or pharmacist before using salt substitutes.  Do not fry foods. Cook foods using healthy methods such as baking, boiling, grilling, and broiling instead.  Cook with  heart-healthy oils, such as olive, canola, soybean, or sunflower oil. Meal planning   Eat a balanced diet that includes: ? 5 or more servings of fruits and vegetables each day. At each meal, try to fill half of your plate with fruits and vegetables. ? Up to 6-8 servings of whole grains each day. ? Less than 6 oz of lean meat, poultry, or fish each day. A 3-oz serving of meat is about the same size as a deck of cards. One egg equals 1 oz. ? 2 servings of low-fat dairy each day. ? A serving of nuts, seeds, or beans 5 times each week. ? Heart-healthy fats. Healthy fats called Omega-3 fatty acids are found in foods such as flaxseeds and coldwater fish, like sardines, salmon, and mackerel.  Limit how much you eat of the following: ? Canned or prepackaged foods. ? Food that is high in trans fat, such as fried foods. ? Food that is high in saturated fat, such as fatty meat. ? Sweets, desserts, sugary drinks, and other foods with added sugar. ? Full-fat dairy products.  Do not salt foods before eating.  Try to eat at least 2 vegetarian meals each week.  Eat more home-cooked food and less restaurant, buffet, and fast food.  When eating at a restaurant, ask that your food be prepared with less salt or no salt, if possible. What foods are recommended? The items listed may not be a complete list. Talk with your dietitian about what   dietary choices are best for you. Grains Whole-grain or whole-wheat bread. Whole-grain or whole-wheat pasta. Brown rice. Oatmeal. Quinoa. Bulgur. Whole-grain and low-sodium cereals. Pita bread. Low-fat, low-sodium crackers. Whole-wheat flour tortillas. Vegetables Fresh or frozen vegetables (raw, steamed, roasted, or grilled). Low-sodium or reduced-sodium tomato and vegetable juice. Low-sodium or reduced-sodium tomato sauce and tomato paste. Low-sodium or reduced-sodium canned vegetables. Fruits All fresh, dried, or frozen fruit. Canned fruit in natural juice (without  added sugar). Meat and other protein foods Skinless chicken or turkey. Ground chicken or turkey. Pork with fat trimmed off. Fish and seafood. Egg whites. Dried beans, peas, or lentils. Unsalted nuts, nut butters, and seeds. Unsalted canned beans. Lean cuts of beef with fat trimmed off. Low-sodium, lean deli meat. Dairy Low-fat (1%) or fat-free (skim) milk. Fat-free, low-fat, or reduced-fat cheeses. Nonfat, low-sodium ricotta or cottage cheese. Low-fat or nonfat yogurt. Low-fat, low-sodium cheese. Fats and oils Soft margarine without trans fats. Vegetable oil. Low-fat, reduced-fat, or light mayonnaise and salad dressings (reduced-sodium). Canola, safflower, olive, soybean, and sunflower oils. Avocado. Seasoning and other foods Herbs. Spices. Seasoning mixes without salt. Unsalted popcorn and pretzels. Fat-free sweets. What foods are not recommended? The items listed may not be a complete list. Talk with your dietitian about what dietary choices are best for you. Grains Baked goods made with fat, such as croissants, muffins, or some breads. Dry pasta or rice meal packs. Vegetables Creamed or fried vegetables. Vegetables in a cheese sauce. Regular canned vegetables (not low-sodium or reduced-sodium). Regular canned tomato sauce and paste (not low-sodium or reduced-sodium). Regular tomato and vegetable juice (not low-sodium or reduced-sodium). Pickles. Olives. Fruits Canned fruit in a light or heavy syrup. Fried fruit. Fruit in cream or butter sauce. Meat and other protein foods Fatty cuts of meat. Ribs. Fried meat. Bacon. Sausage. Bologna and other processed lunch meats. Salami. Fatback. Hotdogs. Bratwurst. Salted nuts and seeds. Canned beans with added salt. Canned or smoked fish. Whole eggs or egg yolks. Chicken or turkey with skin. Dairy Whole or 2% milk, cream, and half-and-half. Whole or full-fat cream cheese. Whole-fat or sweetened yogurt. Full-fat cheese. Nondairy creamers. Whipped toppings.  Processed cheese and cheese spreads. Fats and oils Butter. Stick margarine. Lard. Shortening. Ghee. Bacon fat. Tropical oils, such as coconut, palm kernel, or palm oil. Seasoning and other foods Salted popcorn and pretzels. Onion salt, garlic salt, seasoned salt, table salt, and sea salt. Worcestershire sauce. Tartar sauce. Barbecue sauce. Teriyaki sauce. Soy sauce, including reduced-sodium. Steak sauce. Canned and packaged gravies. Fish sauce. Oyster sauce. Cocktail sauce. Horseradish that you find on the shelf. Ketchup. Mustard. Meat flavorings and tenderizers. Bouillon cubes. Hot sauce and Tabasco sauce. Premade or packaged marinades. Premade or packaged taco seasonings. Relishes. Regular salad dressings. Where to find more information:  National Heart, Lung, and Blood Institute: www.nhlbi.nih.gov  American Heart Association: www.heart.org Summary  The DASH eating plan is a healthy eating plan that has been shown to reduce high blood pressure (hypertension). It may also reduce your risk for type 2 diabetes, heart disease, and stroke.  With the DASH eating plan, you should limit salt (sodium) intake to 2,300 mg a day. If you have hypertension, you may need to reduce your sodium intake to 1,500 mg a day.  When on the DASH eating plan, aim to eat more fresh fruits and vegetables, whole grains, lean proteins, low-fat dairy, and heart-healthy fats.  Work with your health care provider or diet and nutrition specialist (dietitian) to adjust your eating plan to your individual   calorie needs. This information is not intended to replace advice given to you by your health care provider. Make sure you discuss any questions you have with your health care provider. Document Released: 10/15/2011 Document Revised: 10/19/2016 Document Reviewed: 10/19/2016 [employer] Interactive Patient Education  2017 [employer]  

## 2017-09-24 NOTE — Progress Notes (Signed)
Name: Seth Byrd   MRN: [id number]    DOB: 15-Mar-1946   Date:09/24/2017       Progress Note  Subjective  Chief Complaint  Chief Complaint  Patient presents with  . Follow-up    3 month recheck    HPI  Patient presents for 3 month follow up. He is new to me, and is a Dr. Manuella Ghazi patient.    CHF/CAD: He had a heart attack in April 2018 and had CABGx3. He has been seeing Dr. Ubaldo Glassing with [facility] and works with a team in [place] with Dr. Haroldine Laws for his heart failure and medication management. He denies shortness of breath since his surgery in April, denies LE edema, or chest pain.  States he is taking prescribed medications faithfully every day.  Also taking ASA daily.  Hyperlipidemia: He is taking lipitor 40mg  daily.  He denies chest pain or myalgias.  Per Dr. Trena Platt previous notes, we will recheck Lipid panel and CMP.  Hypertension: Also prescribed by Dr. Clayborne Dana office.  Has been doing well - BP is at goal today.  Denies vision changes, headaches, BLE edema, chest pain or shortness of breath. Eating baked foods, avoids salt as much as possible. Discussed getting through the holidays and how to avoid salty/fatty/fried foods. Eating a lot more vegetables and fruits than every before.  Drinking plenty of water -says he is not restricted on amount at this time.  Elevated fasting Glucose: Patient has had mildly elevated fasting glucose (108) x2 episodes over the last 6 months. We will recheck CMP and perform A1C.  Denies polyphagia, polydipsia, or polyuria.  Health Maintenance: Due to Tdap today, will give today. Discussed colorectal cancer screening at length, patient does not want colonoscopy but will try cologuard.  Patient Active Problem List   Diagnosis Date Noted  . Chronic systolic CHF (congestive heart failure) ([place]) 02/25/2017  . Ischemic cardiomyopathy 02/25/2017  . CAD (coronary artery disease) 02/25/2017  . Rheumatoid arthritis involving multiple sites  ([place]) 02/23/2017  . 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 Chest pain, rule out acute myocardial infarction 02/09/2017  . NSTEMI (non-ST elevated myocardial infarction) ([place]) 02/09/2017  . Essential hypertension 11/25/2015  . Hyperlipidemia 11/25/2015  . Panlobular emphysema ([place]) 11/25/2015  . 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 Primary osteoarthritis involving multiple joints 11/25/2015  . Primary osteoarthritis of knee 04/23/2015    Past Surgical History:  Procedure Laterality Date  . BACK SURGERY     Spinal Fusion with 2 screws  . CORONARY ARTERY BYPASS GRAFTING (CABG), ON PUMP, TIMES THREE, USING LEFT INTERNAL MAMMARY ARTERY AND ENDOSCOPICALLY HARVESTED BILATERAL GREATER SAPHENOUS VEINS N/A 02/10/2017   Performed by [name], MD at [facility]    . Left Heart Cath and Coronary Angiography N/A 02/09/2017   Performed by [name], MD at [facility]  . TOTAL KNEE ARTHROPLASTY Left 04/23/2015   Performed by Christophe Louis, MD at [facility]  . TRANSESOPHAGEAL ECHOCARDIOGRAM (TEE) N/A 02/10/2017   Performed by [name], MD at [facility] OR    Family History  Problem Relation Age of Onset  . Hypertension Mother   . Transient ischemic attack Mother   . CVA Mother   . Colon cancer Father   . Hypertension Father   . Prostate cancer Father   . Diabetes Brother   . Kidney cancer Neg Hx   . Bladder Cancer Neg Hx     Social  History   Socioeconomic History  . Marital status: Married    Spouse name: Not on file  . Number of children: Not on file  . Years of education: Not on file  . Highest education level: Not on file  Social Needs  . Financial resource strain: Not on file  . Food insecurity - worry: Not on file  . Food insecurity - inability: Not on file  . Transportation needs - medical: Not on file  . Transportation needs - non-medical: Not on file  Occupational History  . Not on file  Tobacco Use  . Smoking status: Former  Smoker    Packs/day: 1.50    Years: 35.00    Pack years: 52.50    Types: Cigarettes    Last attempt to quit: 04/09/2005    Years since quitting: 12.4  . Smokeless tobacco: Never Used  Substance and Sexual Activity  . Alcohol use: No  . Drug use: No  . Sexual activity: Not on file  Other Topics Concern  . Not on file  Social History Narrative  . Not on file     Current Outpatient Medications:  .  acetaminophen (TYLENOL) 500 MG tablet, Take 500 mg by mouth every 6 (six) hours as needed., Disp: , Rfl:  .  aspirin EC 325 MG EC tablet, Take 1 tablet (325 mg total) by mouth daily., Disp: 30 tablet, Rfl: 0 .  atorvastatin (LIPITOR) 40 MG tablet, Take 1 tablet (40 mg total) by mouth daily at 6 PM., Disp: , Rfl:  .  carvedilol (COREG) 12.5 MG tablet, Take 1 tablet (12.5 mg total) by mouth 2 (two) times daily with a meal., Disp: 60 tablet, Rfl: 5 .  Emollient (CERAVE) CREA, Apply topically., Disp: , Rfl:  .  ENBREL SURECLICK 50 MG/ML injection, , Disp: , Rfl:  .  furosemide (LASIX) 40 MG tablet, Take 1 tablet (40 mg total) by mouth daily., Disp: 30 tablet, Rfl:  .  Multiple Vitamins-Minerals (ONE-A-DAY MENS 50+ ADVANTAGE PO), Take 1 tablet by mouth daily., Disp: , Rfl:  .  Omega-3 Fatty Acids (FISH OIL) 1200 MG CAPS, Take 1 capsule by mouth 2 (two) times daily. , Disp: , Rfl:  .  sacubitril-valsartan (ENTRESTO) 24-26 MG, Take 1 tablet by mouth 2 (two) times daily., Disp: 180 tablet, Rfl: 3 .  spironolactone (ALDACTONE) 25 MG tablet, Take 1 tablet (25 mg total) by mouth daily., Disp: , Rfl:  .  vitamin B-12 (CYANOCOBALAMIN) 1000 MCG tablet, Take 1,000 mcg by mouth daily. , Disp: , Rfl:   No Known Allergies   ROS  Constitutional: Negative for fever or weight change.  Respiratory: Negative for cough and shortness of breath.   Cardiovascular: Negative for chest pain or palpitations.  Gastrointestinal: Negative for abdominal pain, no bowel changes.  Musculoskeletal: Negative for gait problem  or joint swelling.  Skin: Negative for rash.  Neurological: Negative for dizziness or headache.  No other specific complaints in a complete review of systems (except as listed in HPI above)  Objective  Vitals:   09/24/17 0802  BP: 126/82  Pulse: 71  Resp: 18  Temp: 98.3 F (36.8 C)  TempSrc: Oral  SpO2: 97%  Weight: 180 lb 11.2 oz (82 kg)  Height: 5\' 11"  (1.803 m)   Body mass index is 25.2 kg/m.  Physical Exam Constitutional: Patient appears well-developed and well-nourished. Obese No distress.  HEENT: head atraumatic, normocephalic, pupils equal and reactive to light, neck supple, throat within normal limits Cardiovascular: Normal  rate, regular rhythm and normal heart sounds.  No murmur heard. No BLE edema. Pulmonary/Chest: Effort normal and breath sounds normal. No respiratory distress. Abdominal: Soft.  There is no tenderness. Psychiatric: Patient has a normal mood and affect. behavior is normal. Judgment and thought content normal.  Recent Results (from the past 2160 hour(s))  CBC with Differential/Platelet     Status: Abnormal   Collection Time: 08/13/17 12:31 PM  Result Value Ref Range   WBC 4.3 3.4 - 10.8 x10E3/uL   RBC 4.03 (L) 4.14 - 5.80 x10E6/uL   Hemoglobin 13.1 13.0 - 17.7 g/dL   Hematocrit 38.0 37.5 - 51.0 %   MCV 94 79 - 97 fL   MCH 32.5 26.6 - 33.0 pg   MCHC 34.5 31.5 - 35.7 g/dL   RDW 11.8 (L) 12.3 - 15.4 %   Platelets 197 150 - 379 x10E3/uL   Neutrophils 36 Not Estab. %   Lymphs 48 Not Estab. %   Monocytes 11 Not Estab. %   Eos 4 Not Estab. %   Basos 1 Not Estab. %   Neutrophils Absolute 1.6 1.4 - 7.0 x10E3/uL   Lymphocytes Absolute 2.1 0.7 - 3.1 x10E3/uL   Monocytes Absolute 0.5 0.1 - 0.9 x10E3/uL   EOS (ABSOLUTE) 0.2 0.0 - 0.4 x10E3/uL   Basophils Absolute 0.0 0.0 - 0.2 x10E3/uL   Immature Granulocytes 0 Not Estab. %   Immature Grans (Abs) 0.0 0.0 - 0.1 x10E3/uL  VITAMIN D 25 Hydroxy (Vit-D Deficiency, Fractures)     Status: None    Collection Time: 08/13/17 12:31 PM  Result Value Ref Range   Vit D, 25-Hydroxy 39.2 30.0 - 100.0 ng/mL    Comment: Vitamin D deficiency has been defined by the Institute of Medicine and an Endocrine Society practice guideline as a level of serum 25-OH vitamin D less than 20 ng/mL (1,2). The Endocrine Society went on to further define vitamin D insufficiency as a level between 21 and 29 ng/mL (2). 1. IOM (Institute of Medicine). 2010. Dietary reference    intakes for calcium and D. [place]: The    [employer]. 2. Holick MF, Binkley [place], Bischoff-Ferrari HA, et al.    Evaluation, treatment, and prevention of vitamin D    deficiency: an Endocrine Society clinical practice    guideline. JCEM. 2011 Jul; 96(7):1911-30.   TSH     Status: None   Collection Time: 08/13/17 12:31 PM  Result Value Ref Range   TSH 1.460 0.450 - 4.500 uIU/mL  Hepatitis C antibody     Status: None   Collection Time: 08/13/17 12:31 PM  Result Value Ref Range   Hep C Virus Ab 0.4 0.0 - 0.9 s/co ratio    Comment:                                   Negative:     < 0.8                              Indeterminate: 0.8 - 0.9                                   Positive:     > 0.9  The CDC recommends that a positive HCV antibody result  be followed up with a HCV  Nucleic Acid Amplification  test (932671).     PHQ2/9: Depression screen [facility] 2/9 06/22/2017 06/04/2017 04/19/2017 03/16/2017 08/07/2016  Decreased Interest 0 3 0 0 0  Down, Depressed, Hopeless 0 0 0 0 0  PHQ - 2 Score 0 3 0 0 0  Altered sleeping - 0 0 - -  Tired, decreased energy - 0 0 - -  Change in appetite - 0 0 - -  Feeling bad or failure about yourself  - 0 0 - -  Trouble concentrating - 0 0 - -  Moving slowly or fidgety/restless - 0 0 - -  Suicidal thoughts - 0 0 - -  PHQ-9 Score - 3 0 - -  Difficult doing work/chores - Not difficult at all - - -    Fall Risk: Fall Risk  06/22/2017 04/19/2017 03/16/2017 08/07/2016 05/22/2016  Falls in the  past year? No No No No No    Assessment & Plan  1. Essential hypertension - Comprehensive metabolic panel - Stable, keep Cardiology and HF Clinic follow ups  2. Chronic systolic CHF (congestive heart failure) ([place]) - Comprehensive metabolic panel - Stable, keep Cardiology and HF Clinic follow ups  3. Coronary artery disease involving native coronary artery of native heart without angina pectoris - Stable, keep Cardiology and HF Clinic follow ups  4. S/P CABG x 3 - Stable, keep Cardiology and HF Clinic follow ups  5. Hyperlipidemia, unspecified hyperlipidemia type - Comprehensive metabolic panel - Lipid panel  6. Elevated fasting glucose - Hemoglobin A1c - Comprehensive metabolic panel  7. Need for diphtheria-tetanus-pertussis (Tdap) vaccine - Tdap vaccine greater than or equal to 7yo IM  8. Screening for colorectal cancer - Cologuard  - Lifestyle modifications discussed in detail per HPI. Continue to eat healthy, reinforced DASH diet.  He should continue to stay active. - Return in about 4 months (around 01/22/2018) for Follow Up.

## 2017-09-25 LAB — SPECIMEN STATUS REPORT

## 2017-09-25 LAB — COMPREHENSIVE METABOLIC PANEL
A/G RATIO: 1.4 (ref 1.2–2.2)
ALBUMIN: 4.7 g/dL (ref 3.5–4.8)
ALT: 29 IU/L (ref 0–44)
AST: 25 IU/L (ref 0–40)
Alkaline Phosphatase: 80 IU/L (ref 39–117)
BUN / CREAT RATIO: 21 (ref 10–24)
BUN: 23 mg/dL (ref 8–27)
Bilirubin Total: 0.9 mg/dL (ref 0.0–1.2)
CALCIUM: 10.6 mg/dL — AB (ref 8.6–10.2)
CO2: 24 mmol/L (ref 20–29)
CREATININE: 1.07 mg/dL (ref 0.76–1.27)
Chloride: 102 mmol/L (ref 96–106)
GFR, EST AFRICAN AMERICAN: 81 mL/min/{1.73_m2} (ref >59)
GFR, EST NON AFRICAN AMERICAN: 70 mL/min/{1.73_m2} (ref >59)
GLOBULIN, TOTAL: 3.4 g/dL (ref 1.5–4.5)
Glucose: 95 mg/dL (ref 65–99)
POTASSIUM: 4.7 mmol/L (ref 3.5–5.2)
SODIUM: 143 mmol/L (ref 134–144)
TOTAL PROTEIN: 8.1 g/dL (ref 6.0–8.5)

## 2017-09-25 LAB — LIPID PANEL
CHOL/HDL RATIO: 2.9 ratio (ref 0.0–5.0)
Cholesterol, Total: 117 mg/dL (ref 100–199)
HDL: 41 mg/dL (ref >39)
LDL CALC: 64 mg/dL (ref 0–99)
Triglycerides: 61 mg/dL (ref 0–149)
VLDL Cholesterol Cal: 12 mg/dL (ref 5–40)

## 2017-09-25 LAB — HEMOGLOBIN A1C
Est. average glucose Bld gHb Est-mCnc: 103 mg/dL
HEMOGLOBIN A1C: 5.2 % (ref 4.8–5.6)

## 2017-10-07 ENCOUNTER — Encounter (HOSPITAL_COMMUNITY): Payer: Self-pay | Admitting: Internal Medicine

## 2017-10-07 ENCOUNTER — Other Ambulatory Visit: Payer: Self-pay

## 2017-10-07 ENCOUNTER — Ambulatory Visit (HOSPITAL_COMMUNITY)
Admission: RE | Admit: 2017-10-07 | Discharge: 2017-10-07 | Disposition: A | Discharge status: Home / Self Care | Payer: 59 | Source: Ambulatory Visit | Attending: Internal Medicine | Admitting: Internal Medicine

## 2017-10-07 VITALS — BP 128/68 | HR 65 | Wt 187.0 lb

## 2017-10-07 DIAGNOSIS — I110 Hypertensive heart disease with heart failure: Secondary | ICD-10-CM | POA: Diagnosis not present

## 2017-10-07 DIAGNOSIS — E785 Hyperlipidemia, unspecified: Secondary | ICD-10-CM | POA: Insufficient documentation

## 2017-10-07 DIAGNOSIS — I252 Old myocardial infarction: Secondary | ICD-10-CM | POA: Diagnosis not present

## 2017-10-07 DIAGNOSIS — Z8546 Personal history of malignant neoplasm of prostate: Secondary | ICD-10-CM | POA: Insufficient documentation

## 2017-10-07 DIAGNOSIS — Z79899 Other long term (current) drug therapy: Secondary | ICD-10-CM | POA: Diagnosis not present

## 2017-10-07 DIAGNOSIS — Z87891 Personal history of nicotine dependence: Secondary | ICD-10-CM | POA: Diagnosis not present

## 2017-10-07 DIAGNOSIS — M1990 Unspecified osteoarthritis, unspecified site: Secondary | ICD-10-CM | POA: Insufficient documentation

## 2017-10-07 DIAGNOSIS — I255 Ischemic cardiomyopathy: Secondary | ICD-10-CM | POA: Diagnosis not present

## 2017-10-07 DIAGNOSIS — I5022 Chronic systolic (congestive) heart failure: Secondary | ICD-10-CM | POA: Diagnosis not present

## 2017-10-07 DIAGNOSIS — I2510 Atherosclerotic heart disease of native coronary artery without angina pectoris: Secondary | ICD-10-CM | POA: Insufficient documentation

## 2017-10-07 DIAGNOSIS — J449 Chronic obstructive pulmonary disease, unspecified: Secondary | ICD-10-CM | POA: Diagnosis not present

## 2017-10-07 DIAGNOSIS — Z951 Presence of aortocoronary bypass graft: Secondary | ICD-10-CM | POA: Diagnosis not present

## 2017-10-07 DIAGNOSIS — Z7982 Long term (current) use of aspirin: Secondary | ICD-10-CM | POA: Insufficient documentation

## 2017-10-07 MED ORDER — SACUBITRIL-VALSARTAN 49-51 MG PO TABS: 1.0000 | ORAL_TABLET | Freq: Two times a day (BID) | ORAL | 3 refills | Status: DC

## 2017-10-07 NOTE — Progress Notes (Signed)
Advanced Heart Failure Clinic Note   Primary Cardiologist: Dr. Haroldine Laws   HPI:  Seth Byrd is a 71 y.o. AA male with history of chronic systolic CHF due to ischemic cardiomyopathy, Echo 02/09/17 LVEF 20-25%, OA, HTN, HLD, h/o prostate cancer, and COPD.   Underwent CABG x 3 02/10/17.  Pt presents today for HF follow up. Has been following with PA and HF PharmD Clinics. Now on Entresto 24/26, spironolactone 25 and carvedilol 12.5 bid. Feeling good. No CP, orthopnea or PND. 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 No dizziness   02/09/17 Echo LVEF 20-25%, Mild AI, Mild/ModMR, RV mildly dilated and mildly reduced  TEE 02/10/17 (interoperative) Severe LV dilation, EF 20-25% Trileaflet AV with trace regurg Mitral with Mod/Severe regurg RV with mild/mod dilation and mildly reduced function TV with mod TR jet directed toward septum PV with mod PR  02/10/17 Spirometry FVC 2.72 (68%) FEV1 1.94 (64%) FEV1/FVC 70%   Review of systems complete and found to be negative unless listed in HPI.    Past Medical History:  Diagnosis Date  . Arthritis   . Cancer ([place])   . Hypertension   . Myocardial infarction [facility])     Current Outpatient Medications  Medication Sig Dispense Refill  . acetaminophen (TYLENOL) 500 MG tablet Take 500 mg by mouth every 6 (six) hours as needed.    Marland Kitchen aspirin EC 325 MG EC tablet Take 1 tablet (325 mg total) by mouth daily. 30 tablet 0  . atorvastatin (LIPITOR) 40 MG tablet Take 1 tablet (40 mg total) by mouth daily at 6 PM.    . carvedilol (COREG) 12.5 MG tablet Take 1 tablet (12.5 mg total) by mouth 2 (two) times daily with a meal. 60 tablet 5  . Emollient (CERAVE) CREA Apply topically.    Scarlette Shorts SURECLICK 50 MG/ML injection     . furosemide (LASIX) 40 MG tablet Take 1 tablet (40 mg total) by mouth daily. 30 tablet   . Multiple Vitamins-Minerals (ONE-A-DAY MENS 50+ ADVANTAGE PO) Take 1 tablet by mouth daily.    . sacubitril-valsartan (ENTRESTO) 24-26 MG Take 1 tablet  by mouth 2 (two) times daily. 180 tablet 3  . spironolactone (ALDACTONE) 25 MG tablet Take 1 tablet (25 mg total) by mouth daily.    . vitamin B-12 (CYANOCOBALAMIN) 1000 MCG tablet Take 1,000 mcg by mouth daily.     . Omega-3 Fatty Acids (FISH OIL) 1200 MG CAPS Take 1 capsule by mouth 2 (two) times daily.      No current facility-administered medications for this encounter.    No Known Allergies   Social History   Socioeconomic History  . Marital status: Married    Spouse name: Not on file  . Number of children: Not on file  . Years of education: Not on file  . Highest education level: Not on file  Social Needs  . Financial resource strain: Not on file  . Food insecurity - worry: Not on file  . Food insecurity - inability: Not on file  . Transportation needs - medical: Not on file  . Transportation needs - non-medical: Not on file  Occupational History  . Not on file  Tobacco Use  . Smoking status: Former Smoker    Packs/day: 1.50    Years: 35.00    Pack years: 52.50    Types: Cigarettes    Last attempt to quit: 04/09/2005    Years since quitting: 12.5  . Smokeless tobacco: Never Used  Substance and Sexual  Activity  . Alcohol use: No  . Drug use: No  . Sexual activity: Not on file  Other Topics Concern  . Not on file  Social History Narrative  . Not on file      Family History  Problem Relation Age of Onset  . Hypertension Mother   . Transient ischemic attack Mother   . CVA Mother   . Colon cancer Father   . Hypertension Father   . Prostate cancer Father   . Diabetes Brother   . Kidney cancer Neg Hx   . Bladder Cancer Neg Hx     Vitals:   10/07/17 1404  BP: 128/68  Pulse: 65  SpO2: 98%  Weight: 187 lb (84.8 kg)   Wt Readings from Last 3 Encounters:  10/07/17 187 lb (84.8 kg)  09/24/17 180 lb 11.2 oz (82 kg)  08/13/17 179 lb 14.4 oz (81.6 kg)   PHYSICAL EXAM: General:  Well appearing. No resp difficulty HEENT: normal Neck: supple. no JVD. Carotids  2+ bilat; no bruits. No lymphadenopathy or thryomegaly appreciated. Cor: PMI nondisplaced. Regular rate & rhythm. No rubs, gallops or murmurs. Lungs: clear Abdomen: soft, nontender, nondistended. No hepatosplenomegaly. No bruits or masses. Good bowel sounds. Extremities: no cyanosis, clubbing, rash, edema Neuro: alert & orientedx3, cranial nerves grossly intact. moves all 4 extremities w/o difficulty. Affect pleasant  ASSESSMENT & PLAN:  1. Chronic systolic CHF due to ICM - 02/09/17 Echo LVEF 20-25%, Mild AI, Mild/ModMR, RV mildly dilated and mildly reduced - I did bedside echo 40-45% - NYHA I - Volume status stable on exam.   - Continue lasix 40 mg daily - Continue carvedilol 12.5 mg BID - Increase Entresto 49/51 bid - Continue spironolactone 25 mg daily - F/u with Dr. Ubaldo Glassing  2. CAD s/p CABG x 3 in 4/18  - Continue Atorvastatin 40 mg daily.  - Decrease ASA 81mg  daily 3. HLD - Continue statin and ASA. Followed by PCP. Goal LDL < 70    Glori Bickers, MD 10/07/17

## 2017-10-07 NOTE — Patient Instructions (Signed)
Increase Entresto 49/51mg  twice daily.  Follow up with Dr.Bensimhon as needed.

## 2017-10-12 ENCOUNTER — Encounter: Payer: Self-pay | Admitting: Family Medicine

## 2017-10-12 LAB — COLOGUARD: COLOGUARD: NEGATIVE

## 2017-10-15 ENCOUNTER — Other Ambulatory Visit: Payer: 59

## 2017-10-15 DIAGNOSIS — C61 Malignant neoplasm of prostate: Secondary | ICD-10-CM

## 2017-10-16 LAB — PSA TOTAL (REFLEX TO FREE): PROSTATE SPECIFIC AG, SERUM: 3.8 ng/mL (ref 0.0–4.0)

## 2017-10-19 ENCOUNTER — Ambulatory Visit: Payer: 59 | Admitting: Urology

## 2017-10-19 ENCOUNTER — Encounter: Payer: Self-pay | Admitting: Urology

## 2017-10-19 VITALS — BP 113/68 | HR 82 | Ht 71.0 in | Wt 185.1 lb

## 2017-10-19 DIAGNOSIS — C61 Malignant neoplasm of prostate: Secondary | ICD-10-CM

## 2017-10-19 HISTORY — DX: Malignant neoplasm of prostate: C61

## 2017-10-19 NOTE — Progress Notes (Signed)
10/19/2017 2:09 PM   Seth Byrd [birth year] [id number]  Referring provider: Roselee Nova, MD [address] [place], [place] [address]  Chief Complaint  Patient presents with  . 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   He had 1/12 cores positive for Gleason 3+3 adenocarcinoma involving 3% from the right apex.  PSA at the time of his biopsy was 4.8.  His last PSA June 2018 was 3.3.  He has no voiding complaints.  Denies dysuria or gross hematuria.  Denies flank, abdominal, pelvic or scrotal pain.   PMH: Past Medical History:  Diagnosis Date  . Arthritis   . Cancer ([place])   . Hypertension   . Myocardial infarction [facility])     Surgical History: Past Surgical History:  Procedure Laterality Date  . BACK SURGERY     Spinal Fusion with 2 screws  . CORONARY ARTERY BYPASS GRAFT N/A 02/10/2017   Procedure: CORONARY ARTERY BYPASS GRAFTING (CABG), ON PUMP, TIMES THREE, USING LEFT INTERNAL MAMMARY ARTERY AND ENDOSCOPICALLY HARVESTED BILATERAL GREATER SAPHENOUS VEINS;  Surgeon: [name], MD;  Location: [facility];  Service: Open Heart Surgery;  Laterality: N/A;  LIMA to LAD SVG to Diag 1 SVG to OM1  . JOINT REPLACEMENT    . LEFT HEART CATH AND CORONARY ANGIOGRAPHY N/A 02/09/2017   Procedure: Left Heart Cath and Coronary Angiography;  Surgeon: [name], MD;  Location: [facility];  Service: Cardiovascular;  Laterality: N/A;  . TEE WITHOUT CARDIOVERSION N/A 02/10/2017   Procedure: TRANSESOPHAGEAL ECHOCARDIOGRAM (TEE);  Surgeon: [name], MD;  Location: [facility];  Service: Open Heart Surgery;  Laterality: N/A;  . TOTAL KNEE ARTHROPLASTY Left 04/23/2015   Procedure: TOTAL KNEE ARTHROPLASTY;  Surgeon: Christophe Louis, MD;  Location: [facility] ORS;  Service: Orthopedics;  Laterality: Left;    Home Medications:  Allergies as of 10/19/2017   No Known Allergies     Medication List          Accurate as of 10/19/17  2:09 PM. Always use your most recent med list.          acetaminophen 500 MG tablet Commonly known as:  TYLENOL Take 500 mg by mouth every 6 (six) hours as needed.   aspirin 325 MG EC tablet Take 1 tablet (325 mg total) by mouth daily.   atorvastatin 40 MG tablet Commonly known as:  LIPITOR Take 1 tablet (40 mg total) by mouth daily at 6 PM.   carvedilol 12.5 MG tablet Commonly known as:  COREG Take 1 tablet (12.5 mg total) by mouth 2 (two) times daily with a meal.   CERAVE Crea Apply topically.   ENBREL SURECLICK 50 MG/ML injection Generic drug:  etanercept   Fish Oil 1200 MG Caps Take 1 capsule by mouth 2 (two) times daily.   furosemide 40 MG tablet Commonly known as:  LASIX Take 1 tablet (40 mg total) by mouth daily.   ONE-A-DAY MENS 50+ ADVANTAGE PO Take 1 tablet by mouth daily.   sacubitril-valsartan 49-51 MG Commonly known as:  ENTRESTO Take 1 tablet by mouth 2 (two) times daily.   spironolactone 25 MG tablet Commonly known as:  ALDACTONE Take 1 tablet (25 mg total) by mouth daily.   vitamin B-12 1000 MCG tablet Commonly known as:  CYANOCOBALAMIN Take 1,000 mcg by mouth daily.       Allergies: No Known Allergies  Family History:  Family History  Problem Relation Age of Onset  . Hypertension Mother   . Transient ischemic attack Mother   . CVA Mother   . Colon cancer Father   . Hypertension Father   . Prostate cancer Father   . Diabetes Brother   . Kidney cancer Neg Hx   . Bladder Cancer Neg Hx     Social History:  reports that he quit smoking about 12 years ago. His smoking use included cigarettes. He has a 52.50 pack-year smoking history. he has never used smokeless tobacco. He reports that he does not drink alcohol or use drugs.  ROS: UROLOGY Frequent Urination?: No Hard to postpone urination?: No Burning/pain with urination?: No Get up at night to urinate?: No Leakage of urine?: No Urine stream  starts and stops?: No Trouble starting stream?: No Do you have to strain to urinate?: No Blood in urine?: No Urinary tract infection?: No Sexually transmitted disease?: No Injury to kidneys or bladder?: No Painful intercourse?: No Weak stream?: No Erection problems?: No Penile pain?: No  Gastrointestinal Nausea?: No Vomiting?: No Indigestion/heartburn?: No Diarrhea?: No Constipation?: No  Constitutional Fever: No Night sweats?: No Weight loss?: No Fatigue?: No  Skin Skin rash/lesions?: No Itching?: No  Eyes Blurred vision?: No Double vision?: No  Ears/Nose/Throat Sore throat?: No Sinus problems?: No  Hematologic/Lymphatic Swollen glands?: No Easy bruising?: No  Cardiovascular Leg swelling?: No Chest pain?: No  Respiratory Cough?: No Shortness of breath?: No  Endocrine Excessive thirst?: No  Musculoskeletal Back pain?: No Joint pain?: No  Neurological Headaches?: No Dizziness?: No  Psychologic Depression?: No Anxiety?: No  Physical Exam: BP 113/68 (BP Location: Right Arm, Patient Position: Sitting, Cuff Size: Normal)   Pulse 82   Ht 5\' 11"  (1.803 m)   Wt 185 lb 1.6 oz (84 kg)   BMI 25.82 kg/m   Constitutional:  Alert and oriented, No acute distress. HEENT: [place] AT, moist mucus membranes.  Trachea midline, no masses. Cardiovascular: No clubbing, cyanosis, or edema. Respiratory: Normal respiratory effort, no increased work of breathing. GI: Abdomen is soft, nontender, nondistended, no abdominal masses GU: No CVA tenderness. Prostate 40 g, smooth without nodules Skin: No rashes, bruises or suspicious lesions. Lymph: No cervical or inguinal adenopathy. Neurologic: Grossly intact, no focal deficits, moving all 4 extremities. Psychiatric: Normal mood and affect.  Laboratory Data: Lab Results  Component Value Date   WBC 4.3 08/13/2017   HGB 13.1 08/13/2017   HCT 38.0 08/13/2017   MCV 94 08/13/2017   PLT 197 08/13/2017    Lab Results   Component Value Date   CREATININE 1.07 09/24/2017    Lab Results  Component Value Date   PSA1 3.8 10/15/2017   PSA1 3.3 04/16/2017   PSA1 4.1 (H) 11/04/2016    Lab Results  Component Value Date   HGBA1C 5.2 09/24/2017    Urinalysis Lab Results  Component Value Date   APPEARANCEUR CLEAR (A) 04/10/2015   LEUKOCYTESUR NEGATIVE 04/10/2015   PROTEINUR NEGATIVE 04/10/2015   GLUCOSEU NEGATIVE 04/10/2015   RBCU NONE SEEN 04/10/2015   BILIRUBINUR NEGATIVE 04/10/2015   NITRITE NEGATIVE 04/10/2015    Lab Results  Component Value Date   BACTERIA NONE SEEN 04/10/2015   Assessment & Plan:    1. 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  PSA drawn last week was stable at 3.8.  We discussed the role of repeat prostate biopsy within 1 year of his original diagnosis.  He would like to go ahead and get this scheduled.  The potential risks were then reviewed including bleeding and infection/sepsis.   Abbie Sons, [place] [address] [place], [place] [address] [phone]

## 2017-10-28 ENCOUNTER — Encounter: Payer: Self-pay | Admitting: Urology

## 2017-10-28 ENCOUNTER — Ambulatory Visit: Payer: 59 | Admitting: Urology

## 2017-10-28 ENCOUNTER — Other Ambulatory Visit: Payer: Self-pay | Admitting: Urology

## 2017-10-28 VITALS — BP 133/66 | HR 61 | Ht 70.0 in | Wt 180.0 lb

## 2017-10-28 DIAGNOSIS — C61 Malignant neoplasm of prostate: Secondary | ICD-10-CM

## 2017-10-28 NOTE — Progress Notes (Signed)
Prostate Biopsy Procedure   Indications: Low risk prostate cancer on surveillance.  Presents for confirmatory biopsy  Informed consent was obtained after discussing risks/benefits of the procedure.  A time out was performed to ensure correct patient identity.  Pre-Procedure: - Last PSA Level: 10/15/17-3.8 - Gentamicin given prophylactically - Levaquin 500 mg administered PO -Transrectal Ultrasound performed revealing a 41 gm prostate   Procedure: - Prostate block performed using 10 cc 1% lidocaine and biopsies taken from sextant areas, a total of 12 under ultrasound guidance.  Post-Procedure: - Patient tolerated the procedure well - He was counseled to seek immediate medical attention if experiences any severe pain, significant bleeding, or fevers - Return in one week to discuss biopsy results

## 2017-11-08 ENCOUNTER — Telehealth: Payer: Self-pay | Admitting: Family Medicine

## 2017-11-08 NOTE — Telephone Encounter (Signed)
Copied from [place] [id number]. Topic: Inquiry >> Nov 08, 2017  3:52 PM Corie Chiquito, [place] wrote: Reason for CRM: Patient calling because he had a stool sample tested and he still hasn't heard anything back about it.Patient stated that it was done by Raelyn Ensign. If someone could please give him a call back at [phone]

## 2017-11-10 ENCOUNTER — Telehealth: Payer: Self-pay

## 2017-11-10 NOTE — Telephone Encounter (Signed)
Spoke to pt and he wanted to know what his cologurad results were; Went over results with patient.Pt understood the findings. Also mention to pt that his next one will be in 3 years

## 2017-11-10 NOTE — Telephone Encounter (Signed)
A [facility] representative called stating pt specimen has been sent for further testing at [facility]. Representative stated it will delay results 4-5 days.

## 2017-11-11 LAB — PATHOLOGY REPORT

## 2017-11-15 ENCOUNTER — Other Ambulatory Visit: Payer: Self-pay | Admitting: Urology

## 2017-11-17 ENCOUNTER — Encounter: Payer: Self-pay | Admitting: Urology

## 2017-11-17 ENCOUNTER — Ambulatory Visit (INDEPENDENT_AMBULATORY_CARE_PROVIDER_SITE_OTHER): Payer: Managed Care, Other (non HMO) | Admitting: Urology

## 2017-11-17 VITALS — BP 130/71 | HR 66 | Ht 70.0 in | Wt 181.0 lb

## 2017-11-17 DIAGNOSIS — R972 Elevated prostate specific antigen [PSA]: Secondary | ICD-10-CM

## 2017-11-18 NOTE — Progress Notes (Signed)
11/17/2017 7:31 AM   Rodell Perna 12-28-1945 [id number]  Referring provider: Roselee Nova, MD [address] [place], [place] [address]  Chief Complaint  Patient presents with  . 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  He had 1/12 cores positive for Gleason 3+3 adenocarcinoma involving 3% from the right apex.  He elected active surveillance and his PSA has been stable.    Confirmatory biopsy was performed on 10/28/2017.  He had no post biopsy complaints.  Pathology: 1/12 cores positive for Gleason 3+3 adenocarcinoma from the right base involving 1% of the submitted tissue.   PMH: Past Medical History:  Diagnosis Date  . Arthritis   . Cancer ([place])   . Hypertension   . Myocardial infarction [facility])     Surgical History: Past Surgical History:  Procedure Laterality Date  . BACK SURGERY     Spinal Fusion with 2 screws  . CORONARY ARTERY BYPASS GRAFT N/A 02/10/2017   Procedure: CORONARY ARTERY BYPASS GRAFTING (CABG), ON PUMP, TIMES THREE, USING LEFT INTERNAL MAMMARY ARTERY AND ENDOSCOPICALLY HARVESTED BILATERAL GREATER SAPHENOUS VEINS;  Surgeon: [name], MD;  Location: [place];  Service: Open Heart Surgery;  Laterality: N/A;  LIMA to LAD SVG to Diag 1 SVG to OM1  . JOINT REPLACEMENT    . LEFT HEART CATH AND CORONARY ANGIOGRAPHY N/A 02/09/2017   Procedure: Left Heart Cath and Coronary Angiography;  Surgeon: [name], MD;  Location: [place] [facility];  Service: Cardiovascular;  Laterality: N/A;  . TEE WITHOUT CARDIOVERSION N/A 02/10/2017   Procedure: TRANSESOPHAGEAL ECHOCARDIOGRAM (TEE);  Surgeon: [name], MD;  Location: [place];  Service: Open Heart Surgery;  Laterality: N/A;  . TOTAL KNEE ARTHROPLASTY Left 04/23/2015   Procedure: TOTAL KNEE ARTHROPLASTY;  Surgeon: Christophe Louis, MD;  Location: [facility];  Service: Orthopedics;  Laterality: Left;    Home Medications:  Allergies as of  11/17/2017   No Known Allergies     Medication List        Accurate as of 11/17/17 11:59 PM. Always use your most recent med list.          acetaminophen 500 MG tablet Commonly known as:  TYLENOL Take 500 mg by mouth every 6 (six) hours as needed.   aspirin 325 MG EC tablet Take 1 tablet (325 mg total) by mouth daily.   atorvastatin 40 MG tablet Commonly known as:  LIPITOR Take 1 tablet (40 mg total) by mouth daily at 6 PM.   carvedilol 12.5 MG tablet Commonly known as:  COREG Take 1 tablet (12.5 mg total) by mouth 2 (two) times daily with a meal.   CERAVE Crea Apply topically.   ENBREL SURECLICK 50 MG/ML injection Generic drug:  etanercept   Fish Oil 1200 MG Caps Take 1 capsule by mouth 2 (two) times daily.   furosemide 40 MG tablet Commonly known as:  LASIX Take 1 tablet (40 mg total) by mouth daily.   ONE-A-DAY MENS 50+ ADVANTAGE PO Take 1 tablet by mouth daily.   sacubitril-valsartan 49-51 MG Commonly known as:  ENTRESTO Take 1 tablet by mouth 2 (two) times daily.   spironolactone 25 MG tablet Commonly known as:  ALDACTONE Take 1 tablet (25 mg total) by mouth daily.   vitamin B-12 1000 MCG tablet Commonly known as:  CYANOCOBALAMIN Take 1,000 mcg by mouth daily.       Allergies: No Known Allergies  Family  History: Family History  Problem Relation Age of Onset  . Hypertension Mother   . Transient ischemic attack Mother   . CVA Mother   . Colon cancer Father   . Hypertension Father   . Prostate cancer Father   . Diabetes Brother   . Kidney cancer Neg Hx   . Bladder Cancer Neg Hx     Social History:  reports that he quit smoking about 12 years ago. His smoking use included cigarettes. He has a 52.50 pack-year smoking history. he has never used smokeless tobacco. He reports that he does not drink alcohol or use drugs.  ROS: UROLOGY Frequent Urination?: No Hard to postpone urination?: No Burning/pain with urination?: No Get up at night to  urinate?: No Leakage of urine?: No Urine stream starts and stops?: No Trouble starting stream?: No Do you have to strain to urinate?: No Blood in urine?: No Urinary tract infection?: No Sexually transmitted disease?: No Injury to kidneys or bladder?: No Painful intercourse?: No Weak stream?: No Erection problems?: No Penile pain?: No  Gastrointestinal Nausea?: No Vomiting?: No Indigestion/heartburn?: No Diarrhea?: No Constipation?: No  Constitutional Fever: No Night sweats?: No Weight loss?: No Fatigue?: No  Skin Skin rash/lesions?: No Itching?: No  Eyes Blurred vision?: No Double vision?: No  Ears/Nose/Throat Sore throat?: No Sinus problems?: No  Hematologic/Lymphatic Swollen glands?: No Easy bruising?: No  Cardiovascular Leg swelling?: No Chest pain?: No  Respiratory Cough?: No Shortness of breath?: No  Endocrine Excessive thirst?: No  Musculoskeletal Back pain?: No Joint pain?: No  Neurological Headaches?: No Dizziness?: No  Psychologic Depression?: No Anxiety?: No  Physical Exam: BP 130/71   Pulse 66   Ht 5\' 10"  (1.778 m)   Wt 181 lb (82.1 kg)   BMI 25.97 kg/m   Constitutional:  Alert and oriented, No acute distress. HEENT: Bowlegs AT, moist mucus membranes.  Trachea midline, no masses. Cardiovascular: No clubbing, cyanosis, or edema. Respiratory: Normal respiratory effort, no increased work of breathing. GI: Abdomen is soft, nontender, nondistended, no abdominal masses GU: No CVA tenderness.  Skin: No rashes, bruises or suspicious lesions. Lymph: No cervical or inguinal adenopathy. Neurologic: Grossly intact, no focal deficits, moving all 4 extremities. Psychiatric: Normal mood and affect.  Laboratory Data: Lab Results  Component Value Date   WBC 4.3 08/13/2017   HGB 13.1 08/13/2017   HCT 38.0 08/13/2017   MCV 94 08/13/2017   PLT 197 08/13/2017    Lab Results  Component Value Date   CREATININE 1.07 09/24/2017    Lab  Results  Component Value Date   PSA1 3.8 10/15/2017   PSA1 3.3 04/16/2017   PSA1 4.1 (H) 11/04/2016    Lab Results  Component Value Date   HGBA1C 5.2 09/24/2017    Assessment & Plan:    Low risk T1c adenocarcinoma the prostate with focal 3+3 disease on confirmatory biopsy.  He desires to continue active surveillance.  Follow-up 6 months for PSA/DRE. - PSA; Future   Return in about 6 months (around 05/17/2018) for Recheck, PSA.  Abbie Sons, [place] [address], [place], [place] [address] [phone]

## 2017-11-22 ENCOUNTER — Encounter: Payer: Self-pay | Admitting: Urology

## 2017-11-23 ENCOUNTER — Telehealth (HOSPITAL_COMMUNITY): Payer: Self-pay | Admitting: Pharmacist

## 2017-11-23 NOTE — Telephone Encounter (Signed)
Mr. Mabey called asking if he could use the [employer] $10 copay card that he had been given since his Delene Loll is now $125/mo. I have advised him that as long as he still has [employer] he can use the $10 copay card. He is now using [facility] in [place] who will be transferring his active Rx from [place]. I have asked him to call me if there are any issues filling his Rx.   [name], PharmD, BCPS, CPP Clinical Pharmacist Phone: [phone] 11/23/2017 4:42 PM

## 2017-12-04 ENCOUNTER — Other Ambulatory Visit (HOSPITAL_COMMUNITY): Payer: Self-pay | Admitting: Cardiology

## 2018-01-20 ENCOUNTER — Encounter: Payer: Self-pay | Admitting: Family Medicine

## 2018-01-20 ENCOUNTER — Ambulatory Visit: Payer: 59 | Admitting: Family Medicine

## 2018-01-20 VITALS — BP 128/82 | HR 71 | Temp 98.1°F | Wt 188.5 lb

## 2018-01-20 DIAGNOSIS — I10 Essential (primary) hypertension: Secondary | ICD-10-CM | POA: Diagnosis not present

## 2018-01-20 DIAGNOSIS — Z951 Presence of aortocoronary bypass graft: Secondary | ICD-10-CM | POA: Diagnosis not present

## 2018-01-20 DIAGNOSIS — M069 Rheumatoid arthritis, unspecified: Secondary | ICD-10-CM

## 2018-01-20 DIAGNOSIS — C61 Malignant neoplasm of prostate: Secondary | ICD-10-CM | POA: Diagnosis not present

## 2018-01-20 DIAGNOSIS — E785 Hyperlipidemia, unspecified: Secondary | ICD-10-CM

## 2018-01-20 DIAGNOSIS — I252 Old myocardial infarction: Secondary | ICD-10-CM

## 2018-01-20 DIAGNOSIS — I5022 Chronic systolic (congestive) heart failure: Secondary | ICD-10-CM

## 2018-01-20 NOTE — Assessment & Plan Note (Signed)
Managed by cancer center

## 2018-01-20 NOTE — Patient Instructions (Addendum)
Try to follow the DASH guidelines (DASH stands for Dietary Approaches to Stop Hypertension). Try to limit the sodium in your diet to no more than 1,500mg  of sodium per day. Certainly try to not exceed 2,000 mg per day at the very most. Do not add salt when cooking or at the table.  Check the sodium amount on labels when shopping, and choose items lower in sodium when given a choice. Avoid or limit foods that already contain a lot of sodium. Eat a diet rich in fruits and vegetables and whole grains, and try to lose weight if overweight or obese  DASH Eating Plan DASH stands for "Dietary Approaches to Stop Hypertension." The DASH eating plan is a healthy eating plan that has been shown to reduce high blood pressure (hypertension). It may also reduce your risk for type 2 diabetes, heart disease, and stroke. The DASH eating plan may also help with weight loss. What are tips for following this plan? General guidelines  Avoid eating more than 2,300 mg (milligrams) of salt (sodium) a day. If you have hypertension, you may need to reduce your sodium intake to 1,500 mg a day.  Limit alcohol intake to no more than 1 drink a day for nonpregnant women and 2 drinks a day for men. One drink equals 12 oz of beer, 5 oz of wine, or 1 oz of hard liquor.  Work with your health care provider to maintain a healthy body weight or to lose weight. Ask what an ideal weight is for you.  Get at least 30 minutes of exercise that causes your heart to beat faster (aerobic exercise) most days of the week. Activities may include walking, swimming, or biking.  Work with your health care provider or diet and nutrition specialist (dietitian) to adjust your eating plan to your individual calorie needs. Reading food labels  Check food labels for the amount of sodium per serving. Choose foods with less than 5 percent of the Daily Value of sodium. Generally, foods with less than 300 mg of sodium per serving fit into this eating  plan.  To find whole grains, look for the word "whole" as the first word in the ingredient list. Shopping  Buy products labeled as "low-sodium" or "no salt added."  Buy fresh foods. Avoid canned foods and premade or frozen meals. Cooking  Avoid adding salt when cooking. Use salt-free seasonings or herbs instead of table salt or sea salt. Check with your health care provider or pharmacist before using salt substitutes.  Do not fry foods. Cook foods using healthy methods such as baking, boiling, grilling, and broiling instead.  Cook with heart-healthy oils, such as olive, canola, soybean, or sunflower oil. Meal planning   Eat a balanced diet that includes: ? 5 or more servings of fruits and vegetables each day. At each meal, try to fill half of your plate with fruits and vegetables. ? Up to 6-8 servings of whole grains each day. ? Less than 6 oz of lean meat, poultry, or fish each day. A 3-oz serving of meat is about the same size as a deck of cards. One egg equals 1 oz. ? 2 servings of low-fat dairy each day. ? A serving of nuts, seeds, or beans 5 times each week. ? Heart-healthy fats. Healthy fats called Omega-3 fatty acids are found in foods such as flaxseeds and coldwater fish, like sardines, salmon, and mackerel.  Limit how much you eat of the following: ? Canned or prepackaged foods. ? Food that  is high in trans fat, such as fried foods. ? Food that is high in saturated fat, such as fatty meat. ? Sweets, desserts, sugary drinks, and other foods with added sugar. ? Full-fat dairy products.  Do not salt foods before eating.  Try to eat at least 2 vegetarian meals each week.  Eat more home-cooked food and less restaurant, buffet, and fast food.  When eating at a restaurant, ask that your food be prepared with less salt or no salt, if possible. What foods are recommended? The items listed may not be a complete list. Talk with your dietitian about what dietary choices are best  for you. Grains Whole-grain or whole-wheat bread. Whole-grain or whole-wheat pasta. Brown rice. Modena Morrow. Bulgur. Whole-grain and low-sodium cereals. Pita bread. Low-fat, low-sodium crackers. Whole-wheat flour tortillas. Vegetables Fresh or frozen vegetables (raw, steamed, roasted, or grilled). Low-sodium or reduced-sodium tomato and vegetable juice. Low-sodium or reduced-sodium tomato sauce and tomato paste. Low-sodium or reduced-sodium canned vegetables. Fruits All fresh, dried, or frozen fruit. Canned fruit in natural juice (without added sugar). Meat and other protein foods Skinless chicken or [place]. Ground chicken or [place]. Pork with fat trimmed off. Fish and seafood. Egg whites. Dried beans, peas, or lentils. Unsalted nuts, nut butters, and seeds. Unsalted canned beans. Lean cuts of beef with fat trimmed off. Low-sodium, lean deli meat. Dairy Low-fat (1%) or fat-free (skim) milk. Fat-free, low-fat, or reduced-fat cheeses. Nonfat, low-sodium ricotta or cottage cheese. Low-fat or nonfat yogurt. Low-fat, low-sodium cheese. Fats and oils Soft margarine without trans fats. Vegetable oil. Low-fat, reduced-fat, or light mayonnaise and salad dressings (reduced-sodium). Canola, safflower, olive, soybean, and sunflower oils. Avocado. Seasoning and other foods Herbs. Spices. Seasoning mixes without salt. Unsalted popcorn and pretzels. Fat-free sweets. What foods are not recommended? The items listed may not be a complete list. Talk with your dietitian about what dietary choices are best for you. Grains Baked goods made with fat, such as croissants, muffins, or some breads. Dry pasta or rice meal packs. Vegetables Creamed or fried vegetables. Vegetables in a cheese sauce. Regular canned vegetables (not low-sodium or reduced-sodium). Regular canned tomato sauce and paste (not low-sodium or reduced-sodium). Regular tomato and vegetable juice (not low-sodium or reduced-sodium). Angie Fava.  Olives. Fruits Canned fruit in a light or heavy syrup. Fried fruit. Fruit in cream or butter sauce. Meat and other protein foods Fatty cuts of meat. Ribs. Fried meat. Berniece Salines. Sausage. Bologna and other processed lunch meats. Salami. Fatback. Hotdogs. Bratwurst. Salted nuts and seeds. Canned beans with added salt. Canned or smoked fish. Whole eggs or egg yolks. Chicken or [place] with skin. Dairy Whole or 2% milk, cream, and half-and-half. Whole or full-fat cream cheese. Whole-fat or sweetened yogurt. Full-fat cheese. Nondairy creamers. Whipped toppings. Processed cheese and cheese spreads. Fats and oils Butter. Stick margarine. Lard. Shortening. Ghee. Bacon fat. Tropical oils, such as coconut, palm kernel, or palm oil. Seasoning and other foods Salted popcorn and pretzels. Onion salt, garlic salt, seasoned salt, table salt, and sea salt. Worcestershire sauce. Tartar sauce. Barbecue sauce. Teriyaki sauce. Soy sauce, including reduced-sodium. Steak sauce. Canned and packaged gravies. Fish sauce. Oyster sauce. Cocktail sauce. Horseradish that you find on the shelf. Ketchup. Mustard. Meat flavorings and tenderizers. Bouillon cubes. Hot sauce and Tabasco sauce. Premade or packaged marinades. Premade or packaged taco seasonings. Relishes. Regular salad dressings. Where to find more information:  National Heart, Lung, and [place]: https://wilson-eaton.com/  American Heart Association: www.heart.org Summary  The DASH eating plan is a healthy  eating plan that has been shown to reduce high blood pressure (hypertension). It may also reduce your risk for type 2 diabetes, heart disease, and stroke.  With the DASH eating plan, you should limit salt (sodium) intake to 2,300 mg a day. If you have hypertension, you may need to reduce your sodium intake to 1,500 mg a day.  When on the DASH eating plan, aim to eat more fresh fruits and vegetables, whole grains, lean proteins, low-fat dairy, and heart-healthy  fats.  Work with your health care provider or diet and nutrition specialist (dietitian) to adjust your eating plan to your individual calorie needs. This information is not intended to replace advice given to you by your health care provider. Make sure you discuss any questions you have with your health care provider. Document Released: 10/15/2011 Document Revised: 10/19/2016 Document Reviewed: 10/19/2016 Elsevier Interactive Patient Education  [employer].

## 2018-01-20 NOTE — Addendum Note (Signed)
Addended by: Aniket Paye, Satira Anis on: 01/20/2018 08:45 AM   Modules accepted: Orders

## 2018-01-20 NOTE — Assessment & Plan Note (Signed)
Doing well 

## 2018-01-20 NOTE — Assessment & Plan Note (Signed)
Check lipids today; limiting saturated fats

## 2018-01-20 NOTE — Progress Notes (Addendum)
BP 128/82 (BP Location: Left Arm, Patient Position: Sitting, Cuff Size: Normal)   Pulse 71   Temp 98.1 F (36.7 C) (Oral)   Wt 188 lb 8 oz (85.5 kg)   SpO2 98%   BMI 27.05 kg/m    Subjective:    Patient ID: Seth Byrd, male    DOB: [birth year], 72 y.o.   MRN: [id number]  HPI: Seth Byrd is a 72 y.o. male  Chief Complaint  Patient presents with  . Follow-up    HPI Patient is new to me; previous provider left our practice He has prostate cancer; goes to the cancer center for that Emphysema; quit smoking; breathing is doing okay; no wheezing or SHOB; not using inhalers; activity not limited Hx of heart attack; s/p CABG x 3 last year; no chest pain; no fam hx CHF; does get swelling; no orthopnea High cholesterol; fasting; no muscle aches Lab Results  Component Value Date   CHOL 117 09/24/2017   HDL 41 09/24/2017   LDLCALC 64 09/24/2017   TRIG 61 09/24/2017   CHOLHDL 2.9 09/24/2017  Rheumatoid arthritis; Dr. Stephanie Acre in [place]; arthritis is better; taking Enbrel every week HTN; seeing coach through [facility]; limiting salt, knowledgeable about what to eat and what to avoid Calcium was mildly elevated in November; recheck today; patient not aware  Depression screen [facility] 2/9 01/20/2018 06/22/2017 06/04/2017 04/19/2017 03/16/2017  Decreased Interest 0 0 3 0 0  Down, Depressed, Hopeless 0 0 0 0 0  PHQ - 2 Score 0 0 3 0 0  Altered sleeping - - 0 0 -  Tired, decreased energy - - 0 0 -  Change in appetite - - 0 0 -  Feeling bad or failure about yourself  - - 0 0 -  Trouble concentrating - - 0 0 -  Moving slowly or fidgety/restless - - 0 0 -  Suicidal thoughts - - 0 0 -  PHQ-9 Score - - 3 0 -  Difficult doing work/chores - - Not difficult at all - -    Relevant past medical, surgical, family and social history reviewed Past Medical History:  Diagnosis Date  . Arthritis   . Cancer ([place])   . Hypertension   . Myocardial infarction [facility])    Past Surgical History:    Procedure Laterality Date  . BACK SURGERY     Spinal Fusion with 2 screws  . CORONARY ARTERY BYPASS GRAFT N/A 02/10/2017   Procedure: CORONARY ARTERY BYPASS GRAFTING (CABG), ON PUMP, TIMES THREE, USING LEFT INTERNAL MAMMARY ARTERY AND ENDOSCOPICALLY HARVESTED BILATERAL GREATER SAPHENOUS VEINS;  Surgeon: [name], MD;  Location: [place];  Service: Open Heart Surgery;  Laterality: N/A;  LIMA to LAD SVG to Diag 1 SVG to OM1  . JOINT REPLACEMENT    . LEFT HEART CATH AND CORONARY ANGIOGRAPHY N/A 02/09/2017   Procedure: Left Heart Cath and Coronary Angiography;  Surgeon: [name], MD;  Location: [place] [facility];  Service: Cardiovascular;  Laterality: N/A;  . TEE WITHOUT CARDIOVERSION N/A 02/10/2017   Procedure: TRANSESOPHAGEAL ECHOCARDIOGRAM (TEE);  Surgeon: [name], MD;  Location: [place];  Service: Open Heart Surgery;  Laterality: N/A;  . TOTAL KNEE ARTHROPLASTY Left 04/23/2015   Procedure: TOTAL KNEE ARTHROPLASTY;  Surgeon: Christophe Louis, MD;  Location: [facility];  Service: Orthopedics;  Laterality: Left;   Family History  Problem Relation Age of Onset  . Hypertension Mother   . Transient ischemic attack Mother   . CVA  Mother   . Colon cancer Father   . Hypertension Father   . Prostate cancer Father   . Diabetes Brother   . Kidney cancer Neg Hx   . Bladder Cancer Neg Hx    Social History   Tobacco Use  . Smoking status: Former Smoker    Packs/day: 1.50    Years: 35.00    Pack years: 52.50    Types: Cigarettes    Last attempt to quit: 04/09/2005    Years since quitting: 12.7  . Smokeless tobacco: Never Used  Substance Use Topics  . Alcohol use: No  . Drug use: No    Interim medical history since last visit reviewed. Allergies and medications reviewed  Review of Systems Per HPI unless specifically indicated above     Objective:    BP 128/82 (BP Location: Left Arm, Patient Position: Sitting, Cuff Size: Normal)   Pulse 71   Temp 98.1 F (36.7 C)  (Oral)   Wt 188 lb 8 oz (85.5 kg)   SpO2 98%   BMI 27.05 kg/m   Wt Readings from Last 3 Encounters:  01/20/18 188 lb 8 oz (85.5 kg)  11/17/17 181 lb (82.1 kg)  10/28/17 180 lb (81.6 kg)    Physical Exam  Constitutional: He appears well-developed and well-nourished. No distress.  HENT:  Head: Normocephalic and atraumatic.  Eyes: EOM are normal. No scleral icterus.  Neck: No thyromegaly present.  Cardiovascular: Normal rate and regular rhythm.  Pulmonary/Chest: Effort normal and breath sounds normal.  Abdominal: Soft. Bowel sounds are normal. He exhibits no distension.  Musculoskeletal: He exhibits no edema.  Neurological: Coordination normal.  Skin: Skin is warm and dry. No pallor.  Psychiatric: He has a normal mood and affect. His behavior is normal. Judgment and thought content normal.    Results for orders placed or performed in visit on 10/28/17  Pathology Report  Result Value Ref Range   Comment Comment    Comment Comment    Comment Comment    . Comment:    Comment Comment    Comment Comment    Accession comment: Comment    Comment Comment    Comment Comment       Assessment & Plan:   Problem List Items Addressed This Visit      Cardiovascular and Mediastinum   Essential hypertension (Chronic)    Well-controlled; try DASH guidelines      Chronic systolic CHF (congestive heart failure) (HCC) (Chronic)    Seeing Dr. Ubaldo Glassing; no orthopnea; weigh daily, notify heart doctor if weight gain 2 pounds overnight or 5 pounds in a week        Musculoskeletal and Integument   Rheumatoid arthritis involving multiple sites [facility])    Managed by specialist; on Enbrel        Genitourinary   Prostate cancer ([place])    Managed by cancer center        Other   S/P CABG x 3 (Chronic)    Doing well      Hyperlipidemia - Primary (Chronic)    Check lipids today; limiting saturated fats      Relevant Orders   Lipid panel   History of heart attack    No chest pain now;  seeing cardiologist       Other Visit Diagnoses    Hypercalcemia       Relevant Orders   VITAMIN D 25 Hydroxy (Vit-D Deficiency, Fractures)   Basic metabolic panel   Phosphorus  Follow up plan: Return in about 5 months (around 06/22/2018) for follow-up visit with Dr. Sanda Klein.  An after-visit summary was printed and given to the patient at [facility].  Please see the patient instructions which may contain other information and recommendations beyond what is mentioned above in the assessment and plan.  No orders of the defined types were placed in this encounter.   Orders Placed This Encounter  Procedures  . VITAMIN D 25 Hydroxy (Vit-D Deficiency, Fractures)  . Basic metabolic panel  . Lipid panel  . Phosphorus

## 2018-01-20 NOTE — Assessment & Plan Note (Signed)
No chest pain now; seeing cardiologist

## 2018-01-20 NOTE — Assessment & Plan Note (Signed)
Managed by specialist; on Enbrel

## 2018-01-20 NOTE — Assessment & Plan Note (Signed)
Seeing Dr. Ubaldo Glassing; no orthopnea; weigh daily, notify heart doctor if weight gain 2 pounds overnight or 5 pounds in a week

## 2018-01-20 NOTE — Assessment & Plan Note (Signed)
Well-controlled; try DASH guidelines 

## 2018-01-21 ENCOUNTER — Telehealth: Payer: Self-pay

## 2018-01-21 LAB — LIPID PANEL
CHOL/HDL RATIO: 3.2 ratio (ref 0.0–5.0)
Cholesterol, Total: 118 mg/dL (ref 100–199)
HDL: 37 mg/dL — AB (ref >39)
LDL Calculated: 66 mg/dL (ref 0–99)
Triglycerides: 73 mg/dL (ref 0–149)
VLDL Cholesterol Cal: 15 mg/dL (ref 5–40)

## 2018-01-21 LAB — BASIC METABOLIC PANEL
BUN/Creatinine Ratio: 22 (ref 10–24)
BUN: 23 mg/dL (ref 8–27)
CALCIUM: 10 mg/dL (ref 8.6–10.2)
CO2: 24 mmol/L (ref 20–29)
Chloride: 106 mmol/L (ref 96–106)
Creatinine, Ser: 1.06 mg/dL (ref 0.76–1.27)
GFR, EST AFRICAN AMERICAN: 81 mL/min/{1.73_m2} (ref >59)
GFR, EST NON AFRICAN AMERICAN: 70 mL/min/{1.73_m2} (ref >59)
Glucose: 102 mg/dL — ABNORMAL HIGH (ref 65–99)
Potassium: 4.2 mmol/L (ref 3.5–5.2)
Sodium: 145 mmol/L — ABNORMAL HIGH (ref 134–144)

## 2018-01-21 LAB — PHOSPHORUS: PHOSPHORUS: 3 mg/dL (ref 2.5–4.5)

## 2018-01-21 LAB — VITAMIN D 25 HYDROXY (VIT D DEFICIENCY, FRACTURES): Vit D, 25-Hydroxy: 35.6 ng/mL (ref 30.0–100.0)

## 2018-01-21 LAB — SPECIMEN STATUS REPORT

## 2018-01-21 NOTE — Telephone Encounter (Signed)
-----   Message from Arnetha Courser, MD sent at 01/21/2018 12:35 PM EDT ----- Guerry Minors, please let the patient know that his vitamin D level is normal, but lower end of normal. Try to get 800 to 1000 iu of vitamin D3 (OTC) on days when he does not spend much time outside. His calcium level is back to normal. His HDL is a little too low. He can bring that up with activity (like walking, but don't overdo it; always build up gradually and slowly). Other labs overall are okay.

## 2018-01-21 NOTE — Telephone Encounter (Signed)
Called pt informed him of lab results. Pt gave verbal understanding.  

## 2018-02-17 ENCOUNTER — Other Ambulatory Visit (HOSPITAL_COMMUNITY): Payer: Self-pay | Admitting: *Deleted

## 2018-02-17 MED ORDER — SACUBITRIL-VALSARTAN 49-51 MG PO TABS: 1.0000 | ORAL_TABLET | Freq: Two times a day (BID) | ORAL | 3 refills | Status: DC

## 2018-02-21 ENCOUNTER — Other Ambulatory Visit (HOSPITAL_COMMUNITY): Payer: Self-pay | Admitting: Pharmacist

## 2018-02-21 MED ORDER — SACUBITRIL-VALSARTAN 49-51 MG PO TABS: 1.0000 | ORAL_TABLET | Freq: Two times a day (BID) | ORAL | 0 refills | Status: DC

## 2018-02-21 MED ORDER — SACUBITRIL-VALSARTAN 49-51 MG PO TABS: 1.0000 | ORAL_TABLET | Freq: Two times a day (BID) | ORAL | 3 refills | Status: DC

## 2018-02-21 NOTE — Telephone Encounter (Signed)
[facility] is requiring patient to use mail order for his Delene Loll so will send 3 month supply to them and 10 day supply to [facility] so that he will not run out in the meantime.   [name], PharmD, BCPS, CPP Clinical Pharmacist Phone: [phone] 02/21/2018 10:05 AM

## 2018-02-22 ENCOUNTER — Telehealth (HOSPITAL_COMMUNITY): Payer: Self-pay | Admitting: Pharmacist

## 2018-02-22 ENCOUNTER — Other Ambulatory Visit (HOSPITAL_COMMUNITY): Payer: Self-pay | Admitting: Pharmacist

## 2018-02-22 MED ORDER — SACUBITRIL-VALSARTAN 49-51 MG PO TABS: 1.0000 | ORAL_TABLET | Freq: Two times a day (BID) | ORAL | 3 refills | Status: DC

## 2018-02-22 NOTE — Telephone Encounter (Signed)
After discussion with PharmD at [facility], patient does not need to use mail order pharmacy but his medications need to be filled for 90 day supplies. I have sent the [facility] as a 90 day supply to [facility] and relayed this information to Mr. Rondon. I have also asked him to let me know if he has any trouble utilizing the $10 copay card.   [name], PharmD, BCPS, CPP Clinical Pharmacist Phone: [phone] 02/22/2018 2:21 PM

## 2018-05-16 ENCOUNTER — Other Ambulatory Visit: Payer: Managed Care, Other (non HMO)

## 2018-05-16 DIAGNOSIS — R972 Elevated prostate specific antigen [PSA]: Secondary | ICD-10-CM

## 2018-05-17 ENCOUNTER — Ambulatory Visit: Payer: Managed Care, Other (non HMO) | Admitting: Urology

## 2018-05-17 ENCOUNTER — Encounter: Payer: Self-pay | Admitting: Urology

## 2018-05-17 VITALS — BP 120/62 | HR 76 | Ht 70.0 in | Wt 181.0 lb

## 2018-05-17 DIAGNOSIS — C61 Malignant neoplasm of prostate: Secondary | ICD-10-CM

## 2018-05-17 LAB — PSA: PROSTATE SPECIFIC AG, SERUM: 4.2 ng/mL — AB (ref 0.0–4.0)

## 2018-05-19 ENCOUNTER — Encounter: Payer: Self-pay | Admitting: Urology

## 2018-05-19 NOTE — Progress Notes (Signed)
05/17/2018 10:41 AM   Seth Byrd 12-12-1945 [id number]  Referring provider: Roselee Nova, MD [address] [place], [place] [address]  Chief Complaint  Patient presents with  . Elevated PSA   Urologic problem list: - clinical T1c adenocarcinoma prostate, low risk; initial biopsy January 2018 with 1/12 core positive for Gleason 3+3 adenocarcinoma involving 3% at right apex.  He elected active surveillance.  Confirmatory biopsy December 2018 1/12 core positive Gleason 3+3 right base involving 1% of the submitted tissue.  PSA at time of diagnosis of 4.8.  Prostate volume 36 g   HPI: 72 year old male presents for follow-up of the above problem list.  He has had no problems in the last 6 months and states he is doing well.  He has no bothersome lower urinary tract symptoms.  Denies dysuria or gross hematuria.  Denies flank, abdominal, pelvic or scrotal pain.  PSA drawn earlier this week was stable at 4.2.   PMH: Past Medical History:  Diagnosis Date  . Arthritis   . Cancer ([place])   . Hypertension   . Myocardial infarction [facility])     Surgical History: Past Surgical History:  Procedure Laterality Date  . BACK SURGERY     Spinal Fusion with 2 screws  . CORONARY ARTERY BYPASS GRAFT N/A 02/10/2017   Procedure: CORONARY ARTERY BYPASS GRAFTING (CABG), ON PUMP, TIMES THREE, USING LEFT INTERNAL MAMMARY ARTERY AND ENDOSCOPICALLY HARVESTED BILATERAL GREATER SAPHENOUS VEINS;  Surgeon: [name], MD;  Location: [place];  Service: Open Heart Surgery;  Laterality: N/A;  LIMA to LAD SVG to Diag 1 SVG to OM1  . JOINT REPLACEMENT    . LEFT HEART CATH AND CORONARY ANGIOGRAPHY N/A 02/09/2017   Procedure: Left Heart Cath and Coronary Angiography;  Surgeon: [name], MD;  Location: [place] CV LAB;  Service: Cardiovascular;  Laterality: N/A;  . TEE WITHOUT CARDIOVERSION N/A 02/10/2017   Procedure: TRANSESOPHAGEAL ECHOCARDIOGRAM (TEE);  Surgeon: [name], MD;   Location: [place];  Service: Open Heart Surgery;  Laterality: N/A;  . TOTAL KNEE ARTHROPLASTY Left 04/23/2015   Procedure: TOTAL KNEE ARTHROPLASTY;  Surgeon: Christophe Louis, MD;  Location: [facility];  Service: Orthopedics;  Laterality: Left;    Home Medications:  Allergies as of 05/17/2018   No Known Allergies     Medication List        Accurate as of 05/17/18 11:59 PM. Always use your most recent med list.          acetaminophen 500 MG tablet Commonly known as:  TYLENOL Take 500 mg by mouth every 6 (six) hours as needed.   aspirin 325 MG EC tablet Take 1 tablet (325 mg total) by mouth daily.   atorvastatin 40 MG tablet Commonly known as:  LIPITOR Take 1 tablet (40 mg total) by mouth daily at 6 PM.   carvedilol 12.5 MG tablet Commonly known as:  COREG Take 12.5 mg by mouth 2 (two) times daily with a meal.   CERAVE Crea Apply topically.   clobetasol 0.05 % external solution Commonly known as:  TEMOVATE APPLY TO SCALP DAILY   ENBREL SURECLICK 50 MG/ML injection Generic drug:  etanercept   Fish Oil 1200 MG Caps Take 1 capsule by mouth 2 (two) times daily.   furosemide 40 MG tablet Commonly known as:  LASIX Take 1 tablet (40 mg total) by mouth daily.   ketoconazole 2 % cream Commonly known as:  NIZORAL   ONE-A-DAY MENS 50+ ADVANTAGE  PO Take 1 tablet by mouth daily.   sacubitril-valsartan 49-51 MG Commonly known as:  ENTRESTO Take 1 tablet by mouth 2 (two) times daily.   spironolactone 25 MG tablet Commonly known as:  ALDACTONE Take 1 tablet (25 mg total) by mouth daily.   vitamin B-12 1000 MCG tablet Commonly known as:  CYANOCOBALAMIN Take 1,000 mcg by mouth daily.       Allergies: No Known Allergies  Family History: Family History  Problem Relation Age of Onset  . Hypertension Mother   . Transient ischemic attack Mother   . CVA Mother   . Colon cancer Father   . Hypertension Father   . Prostate cancer Father   . Diabetes Brother   . Kidney  cancer Neg Hx   . Bladder Cancer Neg Hx     Social History:  reports that he quit smoking about 13 years ago. His smoking use included cigarettes. He has a 52.50 pack-year smoking history. He has never used smokeless tobacco. He reports that he does not drink alcohol or use drugs.  ROS: UROLOGY Frequent Urination?: No Hard to postpone urination?: No Burning/pain with urination?: No Get up at night to urinate?: No Leakage of urine?: No Urine stream starts and stops?: No Trouble starting stream?: No Do you have to strain to urinate?: No Blood in urine?: No Urinary tract infection?: No Sexually transmitted disease?: No Injury to kidneys or bladder?: No Painful intercourse?: No Weak stream?: No Erection problems?: No Penile pain?: No  Gastrointestinal Nausea?: No Vomiting?: No Indigestion/heartburn?: No Diarrhea?: No Constipation?: No  Constitutional Fever: No Night sweats?: No Weight loss?: No Fatigue?: No  Skin Skin rash/lesions?: No Itching?: No  Eyes Blurred vision?: No Double vision?: No  Ears/Nose/Throat Sore throat?: No Sinus problems?: No  Hematologic/Lymphatic Swollen glands?: No Easy bruising?: No  Cardiovascular Leg swelling?: No Chest pain?: No  Respiratory Cough?: No Shortness of breath?: No  Endocrine Excessive thirst?: No  Musculoskeletal Back pain?: No Joint pain?: No  Neurological Headaches?: No Dizziness?: No  Psychologic Depression?: No Anxiety?: No  Physical Exam: BP 120/62 (BP Location: Left Arm, Patient Position: Sitting, Cuff Size: Normal)   Pulse 76   Ht 5\' 10"  (1.778 m)   Wt 181 lb (82.1 kg)   BMI 25.97 kg/m   Constitutional:  Alert and oriented, No acute distress. HEENT: [place] AT, moist mucus membranes.  Trachea midline, no masses. Cardiovascular: No clubbing, cyanosis, or edema. Respiratory: Normal respiratory effort, no increased work of breathing. GI: Abdomen is soft, nontender, nondistended, no abdominal  masses GU: No CVA tenderness.  Prostate 40 g, smooth without nodules Lymph: No cervical or inguinal lymphadenopathy. Skin: No rashes, bruises or suspicious lesions. Neurologic: Grossly intact, no focal deficits, moving all 4 extremities. Psychiatric: Normal mood and affect.   Assessment & Plan:   Clinical T1c low risk adenocarcinoma the prostate.  He desires to continue active surveillance and will follow-up in 6 months for PSA/DRE.  Return in about 6 months (around 11/17/2018) for PSA, Recheck.   Abbie Sons, [place] [address], [place], [place] [address] [phone]

## 2018-06-22 ENCOUNTER — Ambulatory Visit: Payer: Managed Care, Other (non HMO) | Admitting: Family Medicine

## 2018-06-22 ENCOUNTER — Encounter: Payer: Self-pay | Admitting: Family Medicine

## 2018-06-22 VITALS — BP 100/62 | HR 68 | Temp 98.2°F | Ht 70.0 in | Wt 181.0 lb

## 2018-06-22 DIAGNOSIS — M069 Rheumatoid arthritis, unspecified: Secondary | ICD-10-CM

## 2018-06-22 DIAGNOSIS — I2510 Atherosclerotic heart disease of native coronary artery without angina pectoris: Secondary | ICD-10-CM

## 2018-06-22 DIAGNOSIS — R739 Hyperglycemia, unspecified: Secondary | ICD-10-CM

## 2018-06-22 DIAGNOSIS — I10 Essential (primary) hypertension: Secondary | ICD-10-CM | POA: Diagnosis not present

## 2018-06-22 DIAGNOSIS — J431 Panlobular emphysema: Secondary | ICD-10-CM

## 2018-06-22 DIAGNOSIS — C61 Malignant neoplasm of prostate: Secondary | ICD-10-CM

## 2018-06-22 DIAGNOSIS — I5022 Chronic systolic (congestive) heart failure: Secondary | ICD-10-CM | POA: Diagnosis not present

## 2018-06-22 DIAGNOSIS — E782 Mixed hyperlipidemia: Secondary | ICD-10-CM

## 2018-06-22 DIAGNOSIS — Z5181 Encounter for therapeutic drug level monitoring: Secondary | ICD-10-CM

## 2018-06-22 NOTE — Assessment & Plan Note (Signed)
Excellent today 

## 2018-06-22 NOTE — Assessment & Plan Note (Signed)
Being managed by urologist

## 2018-06-22 NOTE — Assessment & Plan Note (Signed)
Weighing daily, avoiding salt, on appropriate meds; no evidence today of fluid overload

## 2018-06-22 NOTE — Assessment & Plan Note (Signed)
No chest pain; on aspirin and statin and beta-blocker

## 2018-06-22 NOTE — Patient Instructions (Signed)
It was a pleasure to see you today Try to follow the DASH guidelines (DASH stands for Dietary Approaches to Stop Hypertension). Try to limit the sodium in your diet to no more than 1,500mg  of sodium per day. Certainly try to not exceed 2,000 mg per day at the very most. Do not add salt when cooking or at the table.  Check the sodium amount on labels when shopping, and choose items lower in sodium when given a choice. Avoid or limit foods that already contain a lot of sodium. Eat a diet rich in fruits and vegetables and whole grains, and try to lose weight if overweight or obese

## 2018-06-22 NOTE — Assessment & Plan Note (Signed)
Well controlled 

## 2018-06-22 NOTE — Assessment & Plan Note (Signed)
Check lipids today; LDL goal less 70; avoiding saturated fats

## 2018-06-22 NOTE — Progress Notes (Signed)
BP 100/62 (BP Location: Right Arm, Patient Position: Sitting, Cuff Size: Normal)   Pulse 68   Temp 98.2 F (36.8 C) (Oral)   Ht 5\' 10"  (1.778 m)   Wt 181 lb (82.1 kg)   SpO2 100%   BMI 25.97 kg/m    Subjective:    Patient ID: Seth Byrd, male    DOB: 10-26-1946, 72 y.o.   MRN: [id number]  HPI: Seth Byrd is a 72 y.o. male  Chief Complaint  Patient presents with  . Hyperlipidemia  . Hypertension    HPI Patient is here for f/u  Cardiomyopathy (ischemic); coronary artery disease, CHF He is going to have an echo in the fall Managed by Dr. Ubaldo Glassing, cardiologist No chest pain; staying away from salt; weighs himself every day; knows to call if gaining weight  High cholesterol; trying to watch his diet; trying to limit saturated fats; tolerating statin fine; fruits are his snack Lab Results  Component Value Date   CHOL 118 01/20/2018   HDL 37 (L) 01/20/2018   LDLCALC 66 01/20/2018   TRIG 73 01/20/2018   CHOLHDL 3.2 01/20/2018   Emphysema; no trouble this summer because he has been staying indoors during the heat  Gets flu shots; will get in October  Prostate managed by urologist, Dr. Bernardo Heater, elevated PSA; he goes back in January; just watching right now  Rheumatoid arthritis; managed with enbrel; seeing rheumatologist  Depression screen [facility] 2/9 06/22/2018 01/20/2018 06/22/2017 06/04/2017 04/19/2017  Decreased Interest 0 0 0 3 0  Down, Depressed, Hopeless 0 0 0 0 0  PHQ - 2 Score 0 0 0 3 0  Altered sleeping - - - 0 0  Tired, decreased energy - - - 0 0  Change in appetite - - - 0 0  Feeling bad or failure about yourself  - - - 0 0  Trouble concentrating - - - 0 0  Moving slowly or fidgety/restless - - - 0 0  Suicidal thoughts - - - 0 0  PHQ-9 Score - - - 3 0  Difficult doing work/chores - - - Not difficult at all -   Relevant past medical, surgical, family and social history reviewed Past Medical History:  Diagnosis Date  . Arthritis   . Cancer ([place])   .  Hypertension   . Myocardial infarction [facility])    Past Surgical History:  Procedure Laterality Date  . BACK SURGERY     Spinal Fusion with 2 screws  . CORONARY ARTERY BYPASS GRAFT N/A 02/10/2017   Procedure: CORONARY ARTERY BYPASS GRAFTING (CABG), ON PUMP, TIMES THREE, USING LEFT INTERNAL MAMMARY ARTERY AND ENDOSCOPICALLY HARVESTED BILATERAL GREATER SAPHENOUS VEINS;  Surgeon: [name], MD;  Location: [facility];  Service: Open Heart Surgery;  Laterality: N/A;  LIMA to LAD SVG to Diag 1 SVG to OM1  . JOINT REPLACEMENT    . LEFT HEART CATH AND CORONARY ANGIOGRAPHY N/A 02/09/2017   Procedure: Left Heart Cath and Coronary Angiography;  Surgeon: [name], MD;  Location: [place] CV LAB;  Service: Cardiovascular;  Laterality: N/A;  . TEE WITHOUT CARDIOVERSION N/A 02/10/2017   Procedure: TRANSESOPHAGEAL ECHOCARDIOGRAM (TEE);  Surgeon: [name], MD;  Location: [place];  Service: Open Heart Surgery;  Laterality: N/A;  . TOTAL KNEE ARTHROPLASTY Left 04/23/2015   Procedure: TOTAL KNEE ARTHROPLASTY;  Surgeon: Christophe Louis, MD;  Location: [facility];  Service: Orthopedics;  Laterality: Left;   Family History  Problem Relation Age of Onset  . Hypertension  Mother   . Transient ischemic attack Mother   . CVA Mother   . Colon cancer Father   . Hypertension Father   . Prostate cancer Father   . Diabetes Brother   . Kidney cancer Neg Hx   . Bladder Cancer Neg Hx    Social History   Tobacco Use  . Smoking status: Former Smoker    Packs/day: 1.50    Years: 35.00    Pack years: 52.50    Types: Cigarettes    Last attempt to quit: 04/09/2005    Years since quitting: 13.2  . Smokeless tobacco: Never Used  Substance Use Topics  . Alcohol use: No  . Drug use: No    Interim medical history since last visit reviewed. Allergies and medications reviewed  Review of Systems Per HPI unless specifically indicated above     Objective:    BP 100/62 (BP Location: Right Arm, Patient  Position: Sitting, Cuff Size: Normal)   Pulse 68   Temp 98.2 F (36.8 C) (Oral)   Ht 5\' 10"  (1.778 m)   Wt 181 lb (82.1 kg)   SpO2 100%   BMI 25.97 kg/m   Wt Readings from Last 3 Encounters:  06/22/18 181 lb (82.1 kg)  05/17/18 181 lb (82.1 kg)  01/20/18 188 lb 8 oz (85.5 kg)    Physical Exam  Constitutional: He appears well-developed and well-nourished. No distress.  HENT:  Head: Normocephalic and atraumatic.  Eyes: EOM are normal. No scleral icterus.  Neck: No thyromegaly present.  Cardiovascular: Normal rate and regular rhythm.  Pulmonary/Chest: Effort normal and breath sounds normal.  Abdominal: Soft. Bowel sounds are normal. He exhibits no distension.  Musculoskeletal: He exhibits no edema.  Neurological: Coordination normal.  Skin: Skin is warm and dry. No pallor.  Psychiatric: He has a normal mood and affect. His behavior is normal. Judgment and thought content normal.    Results for orders placed or performed in visit on 05/16/18  PSA  Result Value Ref Range   Prostate Specific Ag, Serum 4.2 (H) 0.0 - 4.0 ng/mL      Assessment & Plan:   Problem List Items Addressed This Visit      Cardiovascular and Mediastinum   Essential hypertension (Chronic)    Excellent today      Relevant Orders   Comprehensive Metabolic Panel (CMET)   Chronic systolic CHF (congestive heart failure) (HCC) (Chronic)    Weighing daily, avoiding salt, on appropriate meds; no evidence today of fluid overload      CAD (coronary artery disease) - Primary (Chronic)    No chest pain; on aspirin and statin and beta-blocker        Respiratory   Panlobular emphysema (HCC)    Well-controlled        Musculoskeletal and Integument   Rheumatoid arthritis involving multiple sites ([place])    Managed by specialist; not lmiting lifestyle        Genitourinary   Prostate cancer ([place])    Being managed by urologist        Other   Hyperlipidemia (Chronic)    Check lipids today; LDL goal  less 70; avoiding saturated fats      Relevant Orders   Lipid panel    Other Visit Diagnoses    Medication monitoring encounter       Relevant Orders   COMPLETE METABOLIC PANEL WITH GFR   Comprehensive Metabolic Panel (CMET)   Hyperglycemia       Relevant Orders  Hemoglobin A1C       Follow up plan: No follow-ups on file.  An after-visit summary was printed and given to the patient at [facility].  Please see the patient instructions which may contain other information and recommendations beyond what is mentioned above in the assessment and plan.  No orders of the defined types were placed in this encounter.   Orders Placed This Encounter  Procedures  . COMPLETE METABOLIC PANEL WITH GFR  . Hemoglobin A1C  . Lipid panel  . Comprehensive Metabolic Panel (CMET)

## 2018-06-22 NOTE — Assessment & Plan Note (Signed)
Managed by specialist; not lmiting lifestyle

## 2018-06-23 LAB — COMPREHENSIVE METABOLIC PANEL
ALT: 26 IU/L (ref 0–44)
AST: 24 IU/L (ref 0–40)
Albumin/Globulin Ratio: 1.4 (ref 1.2–2.2)
Albumin: 4.5 g/dL (ref 3.5–4.8)
Alkaline Phosphatase: 60 IU/L (ref 39–117)
BUN/Creatinine Ratio: 21 (ref 10–24)
BUN: 22 mg/dL (ref 8–27)
Bilirubin Total: 1.2 mg/dL (ref 0.0–1.2)
CALCIUM: 9.8 mg/dL (ref 8.6–10.2)
CO2: 21 mmol/L (ref 20–29)
CREATININE: 1.07 mg/dL (ref 0.76–1.27)
Chloride: 105 mmol/L (ref 96–106)
GFR, EST AFRICAN AMERICAN: 80 mL/min/{1.73_m2} (ref >59)
GFR, EST NON AFRICAN AMERICAN: 69 mL/min/{1.73_m2} (ref >59)
GLOBULIN, TOTAL: 3.3 g/dL (ref 1.5–4.5)
GLUCOSE: 103 mg/dL — AB (ref 65–99)
Potassium: 4.1 mmol/L (ref 3.5–5.2)
SODIUM: 142 mmol/L (ref 134–144)
TOTAL PROTEIN: 7.8 g/dL (ref 6.0–8.5)

## 2018-06-23 LAB — HEMOGLOBIN A1C
Est. average glucose Bld gHb Est-mCnc: 103 mg/dL
Hgb A1c MFr Bld: 5.2 % (ref 4.8–5.6)

## 2018-06-23 LAB — LIPID PANEL

## 2018-06-24 LAB — LIPID PANEL
CHOL/HDL RATIO: 3.1 ratio (ref 0.0–5.0)
CHOLESTEROL TOTAL: 111 mg/dL (ref 100–199)
HDL: 36 mg/dL — ABNORMAL LOW (ref >39)
LDL CALC: 60 mg/dL (ref 0–99)
Triglycerides: 75 mg/dL (ref 0–149)
VLDL CHOLESTEROL CAL: 15 mg/dL (ref 5–40)

## 2018-06-24 LAB — SPECIMEN STATUS REPORT

## 2018-07-10 ENCOUNTER — Other Ambulatory Visit (HOSPITAL_COMMUNITY): Payer: Self-pay | Admitting: Cardiology

## 2018-07-17 ENCOUNTER — Encounter: Payer: Self-pay | Admitting: Emergency Medicine

## 2018-07-17 ENCOUNTER — Emergency Department
Admission: EM | Admit: 2018-07-17 | Discharge: 2018-07-17 | Disposition: A | Discharge status: Home / Self Care | Payer: Managed Care, Other (non HMO) | Attending: Emergency Medicine | Admitting: Emergency Medicine

## 2018-07-17 DIAGNOSIS — I252 Old myocardial infarction: Secondary | ICD-10-CM | POA: Diagnosis not present

## 2018-07-17 DIAGNOSIS — Z96652 Presence of left artificial knee joint: Secondary | ICD-10-CM | POA: Diagnosis not present

## 2018-07-17 DIAGNOSIS — R55 Syncope and collapse: Secondary | ICD-10-CM

## 2018-07-17 DIAGNOSIS — I2510 Atherosclerotic heart disease of native coronary artery without angina pectoris: Secondary | ICD-10-CM | POA: Diagnosis not present

## 2018-07-17 DIAGNOSIS — I5022 Chronic systolic (congestive) heart failure: Secondary | ICD-10-CM | POA: Insufficient documentation

## 2018-07-17 DIAGNOSIS — Z8546 Personal history of malignant neoplasm of prostate: Secondary | ICD-10-CM | POA: Insufficient documentation

## 2018-07-17 DIAGNOSIS — Z79899 Other long term (current) drug therapy: Secondary | ICD-10-CM | POA: Insufficient documentation

## 2018-07-17 DIAGNOSIS — Z87891 Personal history of nicotine dependence: Secondary | ICD-10-CM | POA: Insufficient documentation

## 2018-07-17 DIAGNOSIS — M069 Rheumatoid arthritis, unspecified: Secondary | ICD-10-CM | POA: Diagnosis not present

## 2018-07-17 DIAGNOSIS — R531 Weakness: Secondary | ICD-10-CM | POA: Diagnosis present

## 2018-07-17 DIAGNOSIS — I110 Hypertensive heart disease with heart failure: Secondary | ICD-10-CM | POA: Insufficient documentation

## 2018-07-17 HISTORY — DX: Heart failure, unspecified: I50.9

## 2018-07-17 LAB — URINALYSIS, COMPLETE (UACMP) WITH MICROSCOPIC
BILIRUBIN URINE: NEGATIVE
Bacteria, UA: NONE SEEN
Glucose, UA: NEGATIVE mg/dL
HGB URINE DIPSTICK: NEGATIVE
Ketones, ur: NEGATIVE mg/dL
LEUKOCYTES UA: NEGATIVE
NITRITE: NEGATIVE
PH: 6 (ref 5.0–8.0)
Protein, ur: NEGATIVE mg/dL
Specific Gravity, Urine: 1.006 (ref 1.005–1.030)
Squamous Epithelial / LPF: NONE SEEN (ref 0–5)

## 2018-07-17 LAB — CBC
HCT: 40.1 % (ref 40.0–52.0)
Hemoglobin: 13.6 g/dL (ref 13.0–18.0)
MCH: 33.5 pg (ref 26.0–34.0)
MCHC: 33.8 g/dL (ref 32.0–36.0)
MCV: 98.8 fL (ref 80.0–100.0)
PLATELETS: 133 10*3/uL — AB (ref 150–440)
RBC: 4.05 MIL/uL — AB (ref 4.40–5.90)
RDW: 12.4 % (ref 11.5–14.5)
WBC: 9.2 10*3/uL (ref 3.8–10.6)

## 2018-07-17 LAB — BASIC METABOLIC PANEL
Anion gap: 8 (ref 5–15)
BUN: 23 mg/dL (ref 8–23)
CALCIUM: 9.5 mg/dL (ref 8.9–10.3)
CO2: 28 mmol/L (ref 22–32)
CREATININE: 1.25 mg/dL — AB (ref 0.61–1.24)
Chloride: 104 mmol/L (ref 98–111)
GFR, EST NON AFRICAN AMERICAN: 56 mL/min — AB (ref >60)
Glucose, Bld: 141 mg/dL — ABNORMAL HIGH (ref 70–99)
Potassium: 4.3 mmol/L (ref 3.5–5.1)
SODIUM: 140 mmol/L (ref 135–145)

## 2018-07-17 LAB — GLUCOSE, CAPILLARY: Glucose-Capillary: 132 mg/dL — ABNORMAL HIGH (ref 70–99)

## 2018-07-17 LAB — TROPONIN I

## 2018-07-17 MED ORDER — SODIUM CHLORIDE 0.9 % IV BOLUS
1000.0000 mL | Freq: Once | INTRAVENOUS | Status: AC
  Administered 2018-07-17: 1000 mL via INTRAVENOUS

## 2018-07-17 NOTE — ED Notes (Signed)
Pt became  Weak  With chills  No urinary  Symptoms denies any nausea vomiting or diarrhea

## 2018-07-17 NOTE — ED Notes (Signed)
Bolus infused py states feels better

## 2018-07-17 NOTE — Discharge Instructions (Addendum)
Return to the ER for new, worsening, persistent severe dizziness, weakness, sweating, chest pain, difficulty breathing, or any other new or worsening symptoms that concern you.

## 2018-07-17 NOTE — ED Notes (Signed)
V/o DR [facility] give patient 500 cc  Saline bolus

## 2018-07-17 NOTE — ED Provider Notes (Signed)
[facility] Emergency Department Provider Note ____________________________________________   None    (approximate)  I have reviewed the triage vital signs and the nursing notes.   HISTORY  Chief Complaint Chills and Weakness    HPI Seth Byrd is a 72 y.o. male with PMH as noted below who presents with generalized weakness, acute onset this morning, associated with diaphoresis and was feeling lightheaded.  The patient states that it started this morning when he was in his house, and then got worse when he was at church.  The patient states that his service had not yet started and he had not been standing.  He denies previous similar episodes, and states he felt well yesterday.  He denies any associated chest pain or shortness of breath, fevers, nausea or vomiting.  Past Medical History:  Diagnosis Date  . Arthritis   . Cancer ([place])   . CHF (congestive heart failure) ([place])   . Hypertension   . Myocardial infarction [facility])     Patient Active Problem List   Diagnosis Date Noted  . Prostate cancer ([place]) 10/19/2017  . Chronic systolic CHF (congestive heart failure) ([place]) 02/25/2017  . Ischemic cardiomyopathy 02/25/2017  . CAD (coronary artery disease) 02/25/2017  . Rheumatoid arthritis involving multiple sites ([place]) 02/23/2017  . 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 Chest pain, rule out acute myocardial infarction 02/09/2017  . History of heart attack 02/09/2017  . Essential hypertension 11/25/2015  . Hyperlipidemia 11/25/2015  . 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 Primary osteoarthritis involving multiple joints 11/25/2015  . Primary osteoarthritis of knee 04/23/2015    Past Surgical History:  Procedure Laterality Date  . BACK SURGERY     Spinal Fusion with 2 screws  . CORONARY ARTERY BYPASS GRAFT N/A 02/10/2017   Procedure: CORONARY ARTERY BYPASS GRAFTING (CABG), ON PUMP, TIMES THREE, USING  LEFT INTERNAL MAMMARY ARTERY AND ENDOSCOPICALLY HARVESTED BILATERAL GREATER SAPHENOUS VEINS;  Surgeon: [name], MD;  Location: [place];  Service: Open Heart Surgery;  Laterality: N/A;  LIMA to LAD SVG to Diag 1 SVG to OM1  . JOINT REPLACEMENT    . LEFT HEART CATH AND CORONARY ANGIOGRAPHY N/A 02/09/2017   Procedure: Left Heart Cath and Coronary Angiography;  Surgeon: [name], MD;  Location: [place] CV LAB;  Service: Cardiovascular;  Laterality: N/A;  . TEE WITHOUT CARDIOVERSION N/A 02/10/2017   Procedure: TRANSESOPHAGEAL ECHOCARDIOGRAM (TEE);  Surgeon: [name], MD;  Location: [place];  Service: Open Heart Surgery;  Laterality: N/A;  . TOTAL KNEE ARTHROPLASTY Left 04/23/2015   Procedure: TOTAL KNEE ARTHROPLASTY;  Surgeon: Christophe Louis, MD;  Location: [facility] ORS;  Service: Orthopedics;  Laterality: Left;    Prior to Admission medications   Medication Sig Start Date End Date Taking? Authorizing Provider  acetaminophen (TYLENOL) 500 MG tablet Take 500 mg by mouth every 6 (six) hours as needed.    [provider]  aspirin EC 325 MG EC tablet Take 1 tablet (325 mg total) by mouth daily. 02/19/17   Barrett, Erin R, PA-C  atorvastatin (LIPITOR) 40 MG tablet Take 1 tablet (40 mg total) by mouth daily at 6 PM. 02/09/17   Demetrios Loll, MD  carvedilol (COREG) 12.5 MG tablet Take 12.5 mg by mouth 2 (two) times daily with a meal. 05/03/18   [provider]  clobetasol (TEMOVATE) 0.05 % external solution APPLY TO SCALP DAILY 04/04/18   [provider]  Emollient (CERAVE) CREA Apply topically.    [provider]  ENBREL SURECLICK 50 MG/ML injection  07/29/17   [provider]  furosemide (LASIX) 40 MG tablet Take 1 tablet (40 mg total) by mouth daily. 02/19/17   Barrett, Erin R, PA-C  ketoconazole (NIZORAL) 2 % cream  12/13/17   [provider]  Multiple Vitamins-Minerals (ONE-A-DAY MENS 50+ ADVANTAGE PO) Take 1 tablet by mouth daily.     [provider]  Omega-3 Fatty Acids (FISH OIL) 1200 MG CAPS Take 1 capsule by mouth 2 (two) times daily.     [provider]  sacubitril-valsartan (ENTRESTO) 49-51 MG Take 1 tablet by mouth 2 (two) times daily. 02/22/18   Bensimhon, Shaune Pascal, MD  spironolactone (ALDACTONE) 25 MG tablet Take 1 tablet (25 mg total) by mouth daily. 02/19/17   Barrett, Erin R, PA-C  vitamin B-12 (CYANOCOBALAMIN) 1000 MCG tablet Take 1,000 mcg by mouth daily.     [provider]    Allergies Patient has no known allergies.  Family History  Problem Relation Age of Onset  . Hypertension Mother   . Transient ischemic attack Mother   . CVA Mother   . Colon cancer Father   . Hypertension Father   . Prostate cancer Father   . Diabetes Brother   . Kidney cancer Neg Hx   . Bladder Cancer Neg Hx     Social History Social History   Tobacco Use  . Smoking status: Former Smoker    Packs/day: 1.50    Years: 35.00    Pack years: 52.50    Types: Cigarettes    Last attempt to quit: 04/09/2005    Years since quitting: 13.2  . Smokeless tobacco: Never Used  Substance Use Topics  . Alcohol use: No  . Drug use: No    Review of Systems  Constitutional: No fever/chills.  Positive for weakness. Eyes: No redness. ENT: No sore throat. Cardiovascular: Denies chest pain. Respiratory: Denies shortness of breath. Gastrointestinal: No nausea or vomiting.  Genitourinary: Negative for dysuria.  Musculoskeletal: Negative for back pain. Skin: Negative for rash. Neurological: Negative for headache.   ____________________________________________   PHYSICAL EXAM:  VITAL SIGNS: ED Triage Vitals  Enc Vitals Group     BP 07/17/18 0802 (!) 183/163     Pulse Rate 07/17/18 0802 70     Resp 07/17/18 0802 18     Temp 07/17/18 0802 98.7 F (37.1 C)     Temp Source 07/17/18 0802 Oral     SpO2 07/17/18 0802 97 %     Weight 07/17/18 0803 180 lb (81.6 kg)     Height 07/17/18 0803 5\' 10"  (1.778  m)     Head Circumference --      Peak Flow --      Pain Score 07/17/18 0803 0     Pain Loc --      Pain Edu? --      Excl. in [place]? --     Constitutional: Alert and oriented. Well appearing and in no acute distress. Eyes: Conjunctivae are normal.  EOMI. Head: Atraumatic. Nose: No congestion/rhinnorhea. Mouth/Throat: Mucous membranes are slightly dry.   Neck: Normal range of motion.  Cardiovascular: Normal rate, regular rhythm. Grossly normal heart sounds.  Good peripheral circulation. Respiratory: Normal respiratory effort.  No retractions. Lungs CTAB. Gastrointestinal: Soft and nontender. No distention.  Genitourinary: No flank tenderness. Musculoskeletal: No lower extremity edema.  Extremities warm and well perfused.  Neurologic:  Normal speech and  language. No gross focal neurologic deficits are appreciated.  Skin:  Skin is warm and dry. No rash noted. Psychiatric: Mood and affect are normal. Speech and behavior are normal.  ____________________________________________   LABS (all labs ordered are listed, but only abnormal results are displayed)  Labs Reviewed  BASIC METABOLIC PANEL - Abnormal; Notable for the following components:      Result Value   Glucose, Bld 141 (*)    Creatinine, Ser 1.25 (*)    GFR calc non Af Amer 56 (*)    All other components within normal limits  CBC - Abnormal; Notable for the following components:   RBC 4.05 (*)    Platelets 133 (*)    All other components within normal limits  URINALYSIS, COMPLETE (UACMP) WITH MICROSCOPIC - Abnormal; Notable for the following components:   Color, Urine YELLOW (*)    APPearance CLEAR (*)    All other components within normal limits  GLUCOSE, CAPILLARY - Abnormal; Notable for the following components:   Glucose-Capillary 132 (*)    All other components within normal limits  TROPONIN I  CBG MONITORING, ED   ____________________________________________  EKG  ED ECG REPORT I, Arta Silence, the  attending physician, personally viewed and interpreted this ECG.  Date: 07/17/2018 EKG Time: 0806 Rate: 68 Rhythm: normal sinus rhythm QRS Axis: normal Intervals: Nonspecific intraventricular conduction delay ST/T Wave abnormalities: normal Narrative Interpretation: no evidence of acute ischemia  ____________________________________________  RADIOLOGY    ____________________________________________   PROCEDURES  Procedure(s) performed: No  Procedures  Critical Care performed: No ____________________________________________   INITIAL IMPRESSION / ASSESSMENT AND PLAN / ED COURSE  Pertinent labs & imaging results that were available during my care of the patient were reviewed by me and considered in my medical decision making (see chart for details).  72 year old male with PMH as noted above presents with acute onset of generalized weakness and lightheadedness with diaphoresis.  No nausea, chest pain, or other associated symptoms.  Patient states he is feeling significantly better now.  On exam, the patient is very well-appearing.  He was initially hypertensive when he arrived, however repeat blood pressure now is borderline hypotensive.  His extremities appear well-perfused.  Neuro exam is nonfocal.  The remainder of the exam is as described above.  Overall the presentation is most consistent with vasovagal near syncope.  Differential would also include dehydration, viral syndrome or other infection, or less likely cardiac etiology.  Will obtain basic labs, troponin, UA, give fluids, and reassess.   ----------------------------------------- 11:38 AM on 07/17/2018 -----------------------------------------  The lab work-up and UA are unremarkable except for minimally elevated creatinine which is likely of no clinical significance.  Patient feels better after fluids and his blood pressure has remained stable.  I counseled the patient on the results of the work-up.  He is  stable for discharge home at this time.  Return precautions given, and he expresses understanding.  ____________________________________________   FINAL CLINICAL IMPRESSION(S) / ED DIAGNOSES  Final diagnoses:  Near syncope      NEW MEDICATIONS STARTED DURING THIS VISIT:  New Prescriptions   No medications on file     Note:  This document was prepared using Dragon voice recognition software and may include unintentional dictation errors.    Arta Silence, MD 07/17/18 [phone]

## 2018-07-17 NOTE — ED Triage Notes (Signed)
Patient presents to the ED stating he was at church and he became diaphoretic and he was feeling weak.  Patient states now he is having chills.  Patient is alert and oriented x 4 at this time.  Patient's mucous membranes appear slightly pale.

## 2018-08-19 ENCOUNTER — Telehealth: Payer: Self-pay | Admitting: *Deleted

## 2018-08-19 ENCOUNTER — Encounter: Payer: Self-pay | Admitting: Family Medicine

## 2018-08-19 ENCOUNTER — Ambulatory Visit (INDEPENDENT_AMBULATORY_CARE_PROVIDER_SITE_OTHER): Payer: Managed Care, Other (non HMO) | Admitting: Family Medicine

## 2018-08-19 VITALS — BP 118/76 | HR 98 | Temp 97.8°F | Ht 70.0 in | Wt 184.8 lb

## 2018-08-19 DIAGNOSIS — C61 Malignant neoplasm of prostate: Secondary | ICD-10-CM

## 2018-08-19 DIAGNOSIS — Z87891 Personal history of nicotine dependence: Secondary | ICD-10-CM

## 2018-08-19 DIAGNOSIS — H6121 Impacted cerumen, right ear: Secondary | ICD-10-CM

## 2018-08-19 DIAGNOSIS — Z0000 Encounter for general adult medical examination without abnormal findings: Secondary | ICD-10-CM | POA: Insufficient documentation

## 2018-08-19 DIAGNOSIS — Z23 Encounter for immunization: Secondary | ICD-10-CM | POA: Diagnosis not present

## 2018-08-19 NOTE — Assessment & Plan Note (Signed)
Active surveillance with urologist

## 2018-08-19 NOTE — Progress Notes (Signed)
Patient ID: Seth Byrd, male   DOB: 1946/08/15, 72 y.o.   MRN: [id number]   Subjective:   Seth Byrd is a 72 y.o. male here for a complete physical exam; patient states his insurance pays for a physical; clarified prior to the appointment  Interim issues since last visit: he went to the ER after he started sweating in church; he got scanned and they checked him out; he had open heart surgery; he wasn't drinking enough water  USPSTF grade A and B recommendations Depression:  Depression screen [facility] 2/9 08/19/2018 08/19/2018 06/22/2018 01/20/2018 06/22/2017  Decreased Interest 0 0 0 0 0  Down, Depressed, Hopeless 0 0 0 0 0  PHQ - 2 Score 0 0 0 0 0  Altered sleeping 0 - - - -  Tired, decreased energy 0 - - - -  Change in appetite 0 - - - -  Feeling bad or failure about yourself  0 - - - -  Trouble concentrating 0 - - - -  Moving slowly or fidgety/restless 0 - - - -  Suicidal thoughts 0 - - - -  PHQ-9 Score 0 - - - -  Difficult doing work/chores Not difficult at all - - - -   Hypertension: BP Readings from Last 3 Encounters:  08/19/18 118/76  07/17/18 107/63  06/22/18 100/62   Obesity: Wt Readings from Last 3 Encounters:  08/19/18 184 lb 12.8 oz (83.8 kg)  07/17/18 180 lb (81.6 kg)  06/22/18 181 lb (82.1 kg)   BMI Readings from Last 3 Encounters:  08/19/18 26.52 kg/m  07/17/18 25.83 kg/m  06/22/18 25.97 kg/m    Immunizations: flu shot UTD; pneumonia shots UTD; tetanus UTD  Skin cancer: no worrisome moles Lung cancer:  Yearly chest CT until quit 15 years Prostate cancer: seeing urologist, active surveillance No results found for: PSA Colorectal cancer: cologuard 2018 AAA: no AAA on scan in 2013 Aspirin: taking baby aspirin daily Diet: eats lots of salads, grilled, no fried foods Exercise: pretty active Alcohol:    Office Visit from 08/19/2018 in [facility]  AUDIT-C Score  0     Tobacco use: quit 2006; needs yearly chest CT HIV, hep B,  hep C: not interested STD testing and prevention (chl/gon/syphilis): not interseted Glucose:  Glucose  Date Value Ref Range Status  06/22/2018 103 (H) 65 - 99 mg/dL Final  01/20/2018 102 (H) 65 - 99 mg/dL Final  09/24/2017 95 65 - 99 mg/dL Final   Glucose, Bld  Date Value Ref Range Status  07/17/2018 141 (H) 70 - 99 mg/dL Final  05/03/2017 108 (H) 65 - 99 mg/dL Final  02/25/2017 90 65 - 99 mg/dL Final   Glucose-Capillary  Date Value Ref Range Status  07/17/2018 132 (H) 70 - 99 mg/dL Final  02/17/2017 150 (H) 65 - 99 mg/dL Final  02/17/2017 90 65 - 99 mg/dL Final   Lipids:  Lab Results  Component Value Date   CHOL CANCELED 06/22/2018   CHOL 111 06/22/2018   CHOL 118 01/20/2018   Lab Results  Component Value Date   HDL CANCELED 06/22/2018   HDL 36 (L) 06/22/2018   HDL 37 (L) 01/20/2018   Lab Results  Component Value Date   LDLCALC 60 06/22/2018   [place] 66 01/20/2018   LDLCALC 64 09/24/2017   Lab Results  Component Value Date   TRIG CANCELED 06/22/2018   TRIG 75 06/22/2018   TRIG 73 01/20/2018   Lab Results  Component Value Date  CHOLHDL 3.1 06/22/2018   CHOLHDL 3.2 01/20/2018   CHOLHDL 2.9 09/24/2017   No results found for: LDLDIRECT    Past Medical History:  Diagnosis Date  . Arthritis   . Cancer ([place])   . CHF (congestive heart failure) ([place])   . Hypertension   . Myocardial infarction ([place])   . Prostate cancer ([place]) 10/19/2017   Past Surgical History:  Procedure Laterality Date  . BACK SURGERY     Spinal Fusion with 2 screws  . CORONARY ARTERY BYPASS GRAFT N/A 02/10/2017   Procedure: CORONARY ARTERY BYPASS GRAFTING (CABG), ON PUMP, TIMES THREE, USING LEFT INTERNAL MAMMARY ARTERY AND ENDOSCOPICALLY HARVESTED BILATERAL GREATER SAPHENOUS VEINS;  Surgeon: [name], MD;  Location: [place];  Service: Open Heart Surgery;  Laterality: N/A;  LIMA to LAD SVG to Diag 1 SVG to OM1  . JOINT REPLACEMENT    . LEFT HEART CATH AND CORONARY ANGIOGRAPHY  N/A 02/09/2017   Procedure: Left Heart Cath and Coronary Angiography;  Surgeon: [name], MD;  Location: [place] CV LAB;  Service: Cardiovascular;  Laterality: N/A;  . TEE WITHOUT CARDIOVERSION N/A 02/10/2017   Procedure: TRANSESOPHAGEAL ECHOCARDIOGRAM (TEE);  Surgeon: [name], MD;  Location: [place];  Service: Open Heart Surgery;  Laterality: N/A;  . TOTAL KNEE ARTHROPLASTY Left 04/23/2015   Procedure: TOTAL KNEE ARTHROPLASTY;  Surgeon: Christophe Louis, MD;  Location: [facility];  Service: Orthopedics;  Laterality: Left;   Family History  Problem Relation Age of Onset  . Hypertension Mother   . Transient ischemic attack Mother   . CVA Mother   . Colon cancer Father   . Hypertension Father   . Prostate cancer Father   . Diabetes Brother   . Kidney cancer Neg Hx   . Bladder Cancer Neg Hx    Social History   Tobacco Use  . Smoking status: Former Smoker    Packs/day: 1.50    Years: 35.00    Pack years: 52.50    Types: Cigarettes    Last attempt to quit: 04/09/2005    Years since quitting: 13.3  . Smokeless tobacco: Never Used  Substance Use Topics  . Alcohol use: No  . Drug use: No   Review of Systems  Constitutional: Negative for unexpected weight change.  Cardiovascular: Negative for chest pain.  Gastrointestinal: Negative for blood in stool.  Genitourinary: Negative for hematuria.  Hematological: Does not bruise/bleed easily.    Objective:   Vitals:   08/19/18 0824  BP: 118/76  Pulse: 98  Temp: 97.8 F (36.6 C)  TempSrc: Oral  SpO2: 99%  Weight: 184 lb 12.8 oz (83.8 kg)  Height: 5\' 10"  (1.778 m)   Body mass index is 26.52 kg/m. Wt Readings from Last 3 Encounters:  08/19/18 184 lb 12.8 oz (83.8 kg)  07/17/18 180 lb (81.6 kg)  06/22/18 181 lb (82.1 kg)   Physical Exam  Constitutional: He appears well-developed and well-nourished. No distress.  HENT:  Head: Normocephalic and atraumatic.  Left Ear: Tympanic membrane and ear canal normal.  Tympanic membrane is not erythematous.  No middle ear effusion.  Nose: Nose normal.  Mouth/Throat: Oropharynx is clear and moist.  Large dark clump of impacted cerumen completing occluding the RIGHT external auditory canal; removed in 3 pieces under direct visualization by MD using curette; patient reported good relief  Eyes: EOM are normal. No scleral icterus.  Neck: No JVD present. No thyromegaly present.  Cardiovascular: Normal rate, regular rhythm and normal heart sounds.  Pulmonary/Chest: Effort normal and breath sounds normal. No respiratory distress. He has no wheezes. He has no rales.  Abdominal: Soft. Bowel sounds are normal. He exhibits no distension. There is no tenderness. There is no guarding.  Musculoskeletal: Normal range of motion. He exhibits no edema.  Lymphadenopathy:    He has no cervical adenopathy.  Neurological: He is alert. He displays normal reflexes. He exhibits normal muscle tone. Coordination normal.  Skin: Skin is warm and dry. No rash noted. He is not diaphoretic. No erythema. No pallor.  Psychiatric: He has a normal mood and affect. His behavior is normal. Judgment and thought content normal.    Assessment/Plan:   Problem List Items Addressed This Visit      Genitourinary   Prostate cancer [facility])    Active surveillance with urologist      Relevant Medications   aspirin EC 81 MG tablet     Other   Preventative health care - Primary    USPSTF grade A and B recommendations reviewed with patient; age-appropriate recommendations, preventive care, screening tests, etc discussed and encouraged; healthy living encouraged; see AVS for patient education given to patient        Other Visit Diagnoses    Need for influenza vaccination       Relevant Orders   Flu vaccine HIGH DOSE PF (Fluzone High dose) (Completed)   History of tobacco use, presenting hazards to health       Relevant Orders   CT CHEST LUNG CA SCREEN LOW DOSE W/O CM   Impacted cerumen of  right ear       removed the cerumen with good results; pt tolerated very well      No orders of the defined types were placed in this encounter.  Orders Placed This Encounter  Procedures  . CT CHEST LUNG CA SCREEN LOW DOSE W/O CM    Standing Status:   Future    Standing Expiration Date:   10/20/2019    Order Specific Question:   ** REASON FOR EXAM (FREE TEXT)    Answer:   screening only    Order Specific Question:   Reason for Exam (SYMPTOM  OR DIAGNOSIS REQUIRED)    Answer:   former smoker, quit 2006, more than 30 pack years    Order Specific Question:   Preferred [facility] Location?    Answer:    [facility]    Order Specific Question:   Radiology Contrast Protocol - do NOT remove file path    Answer:   [email or web address]  . Flu vaccine HIGH DOSE PF (Fluzone High dose)    Follow up plan: Return in about 1 year (around 08/20/2019) for complete physical.  An After Visit Summary was printed and given to the patient.

## 2018-08-19 NOTE — Telephone Encounter (Signed)
Patient called about ldct screening referral.  Patient reports quitting smoking 15-18 years ago.  Patient was informed that at this time he is NOT eligible for the ldct screening because of being quit >15years. Voiced understanding.  Dr. Sanda Klein notified.

## 2018-08-19 NOTE — Assessment & Plan Note (Signed)
USPSTF grade A and B recommendations reviewed with patient; age-appropriate recommendations, preventive care, screening tests, etc discussed and encouraged; healthy living encouraged; see AVS for patient education given to patient  

## 2018-08-19 NOTE — Patient Instructions (Addendum)
Consider getting the new shingles vaccine called Shingrix; that is available for individuals 71 years of age and older, and is recommended even if you have had shingles in the past and/or already received the old shingles vaccine (Zostavax); it is a two-part series, and is available at many local pharmacies   Health Maintenance, Male A healthy lifestyle and preventive care is important for your health and wellness. Ask your health care provider about what schedule of regular examinations is right for you. What should I know about weight and diet? Eat a Healthy Diet  Eat plenty of vegetables, fruits, whole grains, low-fat dairy products, and lean protein.  Do not eat a lot of foods high in solid fats, added sugars, or salt.  Maintain a Healthy Weight Regular exercise can help you achieve or maintain a healthy weight. You should:  Do at least 150 minutes of exercise each week. The exercise should increase your heart rate and make you sweat (moderate-intensity exercise).  Do strength-training exercises at least twice a week.  Watch Your Levels of Cholesterol and Blood Lipids  Have your blood tested for lipids and cholesterol every 5 years starting at 70 years of age. If you are at high risk for heart disease, you should start having your blood tested when you are 71 years old. You may need to have your cholesterol levels checked more often if: ? Your lipid or cholesterol levels are high. ? You are older than 71 years of age. ? You are at high risk for heart disease.  What should I know about cancer screening? Many types of cancers can be detected early and may often be prevented. Lung Cancer  You should be screened every year for lung cancer if: ? You are a current smoker who has smoked for at least 30 years. ? You are a former smoker who has quit within the past 15 years.  Talk to your health care provider about your screening options, when you should start screening, and how often  you should be screened.  Colorectal Cancer  Routine colorectal cancer screening usually begins at 71 years of age and should be repeated every 5-10 years until you are 71 years old. You may need to be screened more often if early forms of precancerous polyps or small growths are found. Your health care provider may recommend screening at an earlier age if you have risk factors for colon cancer.  Your health care provider may recommend using home test kits to check for hidden blood in the stool.  A small camera at the end of a tube can be used to examine your colon (sigmoidoscopy or colonoscopy). This checks for the earliest forms of colorectal cancer.  Prostate and Testicular Cancer  Depending on your age and overall health, your health care provider may do certain tests to screen for prostate and testicular cancer.  Talk to your health care provider about any symptoms or concerns you have about testicular or prostate cancer.  Skin Cancer  Check your skin from head to toe regularly.  Tell your health care provider about any new moles or changes in moles, especially if: ? There is a change in a mole's size, shape, or color. ? You have a mole that is larger than a pencil eraser.  Always use sunscreen. Apply sunscreen liberally and repeat throughout the day.  Protect yourself by wearing long sleeves, pants, a wide-brimmed hat, and sunglasses when outside.  What should I know about heart disease, diabetes, and high  blood pressure?  If you are 14-29 years of age, have your blood pressure checked every 3-5 years. If you are 63 years of age or older, have your blood pressure checked every year. You should have your blood pressure measured twice-once when you are at a hospital or clinic, and once when you are not at a hospital or clinic. Record the average of the two measurements. To check your blood pressure when you are not at a hospital or clinic, you can use: ? An automated blood pressure  machine at a pharmacy. ? A home blood pressure monitor.  Talk to your health care provider about your target blood pressure.  If you are between 61-49 years old, ask your health care provider if you should take aspirin to prevent heart disease.  Have regular diabetes screenings by checking your fasting blood sugar level. ? If you are at a normal weight and have a low risk for diabetes, have this test once every three years after the age of 110. ? If you are overweight and have a high risk for diabetes, consider being tested at a younger age or more often.  A one-time screening for abdominal aortic aneurysm (AAA) by ultrasound is recommended for men aged 83-75 years who are current or former smokers. What should I know about preventing infection? Hepatitis B If you have a higher risk for hepatitis B, you should be screened for this virus. Talk with your health care provider to find out if you are at risk for hepatitis B infection. Hepatitis C Blood testing is recommended for:  Everyone born from [birth year] through 1964.  Anyone with known risk factors for hepatitis C.  Sexually Transmitted Diseases (STDs)  You should be screened each year for STDs including gonorrhea and chlamydia if: ? You are sexually active and are younger than 71 years of age. ? You are older than 71 years of age and your health care provider tells you that you are at risk for this type of infection. ? Your sexual activity has changed since you were last screened and you are at an increased risk for chlamydia or gonorrhea. Ask your health care provider if you are at risk.  Talk with your health care provider about whether you are at high risk of being infected with HIV. Your health care provider may recommend a prescription medicine to help prevent HIV infection.  What else can I do?  Schedule regular health, dental, and eye exams.  Stay current with your vaccines (immunizations).  Do not use any tobacco products,  such as cigarettes, chewing tobacco, and e-cigarettes. If you need help quitting, ask your health care provider.  Limit alcohol intake to no more than 2 drinks per day. One drink equals 12 ounces of beer, 5 ounces of wine, or 1 ounces of hard liquor.  Do not use street drugs.  Do not share needles.  Ask your health care provider for help if you need support or information about quitting drugs.  Tell your health care provider if you often feel depressed.  Tell your health care provider if you have ever been abused or do not feel safe at home. This information is not intended to replace advice given to you by your health care provider. Make sure you discuss any questions you have with your health care provider. Document Released: 04/23/2008 Document Revised: 06/24/2016 Document Reviewed: 07/30/2015 Elsevier Interactive Patient Education  [employer].

## 2018-08-22 ENCOUNTER — Telehealth: Payer: Self-pay | Admitting: *Deleted

## 2018-08-22 NOTE — Telephone Encounter (Signed)
Received referral for low dose lung cancer screening CT scan.  Message left at phone number listed in EMR for patient to call either myself or Shawn Perkins back at [phone] to facilitate scheduling the scan.    

## 2018-08-23 ENCOUNTER — Telehealth: Payer: Self-pay | Admitting: *Deleted

## 2018-08-23 DIAGNOSIS — Z122 Encounter for screening for malignant neoplasm of respiratory organs: Secondary | ICD-10-CM

## 2018-08-23 NOTE — Telephone Encounter (Signed)
Received a referral for initial lung cancer screening scan.  Contacted the patient and obtained their smoking history, quit 2006 52.5 pkyr history   as well as answering questions related to screening process.  Patient denies signs of lung cancer such as weight loss or hemoptysis at this time.  Patient denies comorbidity that would prevent curative treatment if lung cancer were found.  Patient is scheduled for the Shared Decision Making Visit and CT scan on 08-29-18@1315 .

## 2018-08-26 ENCOUNTER — Telehealth: Payer: Self-pay | Admitting: *Deleted

## 2018-08-26 NOTE — Telephone Encounter (Signed)
Called patient to remind him of his appt for ldct screening on Monday 08-29-18@1315 , voiced understanding

## 2018-08-29 ENCOUNTER — Encounter: Payer: Self-pay | Admitting: *Deleted

## 2018-08-29 ENCOUNTER — Ambulatory Visit
Admission: RE | Admit: 2018-08-29 | Discharge: 2018-08-29 | Disposition: A | Discharge status: Home / Self Care | Payer: Managed Care, Other (non HMO) | Source: Ambulatory Visit | Attending: Oncology | Admitting: Oncology

## 2018-08-29 ENCOUNTER — Inpatient Hospital Stay: Payer: Managed Care, Other (non HMO) | Attending: Oncology | Admitting: Oncology

## 2018-08-29 DIAGNOSIS — J432 Centrilobular emphysema: Secondary | ICD-10-CM | POA: Insufficient documentation

## 2018-08-29 DIAGNOSIS — Z122 Encounter for screening for malignant neoplasm of respiratory organs: Secondary | ICD-10-CM | POA: Insufficient documentation

## 2018-08-29 DIAGNOSIS — J438 Other emphysema: Secondary | ICD-10-CM | POA: Insufficient documentation

## 2018-08-29 DIAGNOSIS — J984 Other disorders of lung: Secondary | ICD-10-CM | POA: Diagnosis not present

## 2018-08-29 DIAGNOSIS — Z87891 Personal history of nicotine dependence: Secondary | ICD-10-CM | POA: Insufficient documentation

## 2018-08-29 DIAGNOSIS — R918 Other nonspecific abnormal finding of lung field: Secondary | ICD-10-CM | POA: Diagnosis not present

## 2018-08-29 DIAGNOSIS — I700 Atherosclerosis of aorta: Secondary | ICD-10-CM | POA: Insufficient documentation

## 2018-08-29 NOTE — Progress Notes (Signed)
In accordance with CMS guidelines, patient has met eligibility criteria including age, absence of signs or symptoms of lung cancer.  Social History   Tobacco Use  . Smoking status: Former Smoker    Packs/day: 1.50    Years: 35.00    Pack years: 52.50    Types: Cigarettes    Last attempt to quit: 04/09/2005    Years since quitting: 13.3  . Smokeless tobacco: Never Used  Substance Use Topics  . Alcohol use: No  . Drug use: No     A shared decision-making session was conducted prior to the performance of CT scan. This includes one or more decision aids, includes benefits and harms of screening, follow-up diagnostic testing, over-diagnosis, false positive rate, and total radiation exposure.  Counseling on the importance of adherence to annual lung cancer LDCT screening, impact of co-morbidities, and ability or willingness to undergo diagnosis and treatment is imperative for compliance of the program.  Counseling on the importance of continued smoking cessation for former smokers; the importance of smoking cessation for current smokers, and information about tobacco cessation interventions have been given to patient including [place] and 1800 quit  programs.  Written order for lung cancer screening with LDCT has been given to the patient and any and all questions have been answered to the best of my abilities.   Yearly follow up will be coordinated by Burgess Estelle, Thoracic Navigator.  Faythe Casa, NP 08/29/2018 1:52 PM

## 2018-11-16 ENCOUNTER — Other Ambulatory Visit: Payer: Self-pay

## 2018-11-16 DIAGNOSIS — C61 Malignant neoplasm of prostate: Secondary | ICD-10-CM

## 2018-11-16 DIAGNOSIS — R972 Elevated prostate specific antigen [PSA]: Secondary | ICD-10-CM

## 2018-11-17 ENCOUNTER — Other Ambulatory Visit: Payer: Managed Care, Other (non HMO)

## 2018-11-17 DIAGNOSIS — C61 Malignant neoplasm of prostate: Secondary | ICD-10-CM

## 2018-11-17 DIAGNOSIS — R972 Elevated prostate specific antigen [PSA]: Secondary | ICD-10-CM

## 2018-11-18 LAB — PSA: PROSTATE SPECIFIC AG, SERUM: 4 ng/mL (ref 0.0–4.0)

## 2018-11-24 ENCOUNTER — Encounter: Payer: Self-pay | Admitting: Urology

## 2018-11-24 ENCOUNTER — Ambulatory Visit (INDEPENDENT_AMBULATORY_CARE_PROVIDER_SITE_OTHER): Payer: Managed Care, Other (non HMO) | Admitting: Urology

## 2018-11-24 ENCOUNTER — Ambulatory Visit: Payer: Managed Care, Other (non HMO) | Admitting: Urology

## 2018-11-24 VITALS — BP 144/73 | HR 74 | Ht 70.5 in | Wt 181.0 lb

## 2018-11-24 DIAGNOSIS — C61 Malignant neoplasm of prostate: Secondary | ICD-10-CM | POA: Diagnosis not present

## 2018-11-24 NOTE — Progress Notes (Signed)
11/24/2018 2:10 PM   Seth Byrd Oct 14, 1946 [id number]  Referring provider: Arnetha Courser, MD [address] [place], [place] [address]  Chief Complaint  Patient presents with  . Follow-up   Urologic problem list: - clinical T1c adenocarcinoma prostate, low risk; initial biopsy January 2018 with 1/12 core positive for Gleason 3+3 adenocarcinoma involving 3% at right apex.  He elected active surveillance.  Confirmatory biopsy December 2018 1/12 core positive Gleason 3+3 right base involving 1% of the submitted tissue.  PSA at time of diagnosis of 4.8.  Prostate volume 36 g  HPI: Seth Byrd is a 73 yo M who returns today for f/u of the above urologic problem list.  -No problems in the last 6 months -No bothersome lower urinary tract symptoms -Denies dysuria or gross hematuria -Denies flank, abdominal, pelvic or scrotal pain -PSA drawn on 11/17/2018 was 4.0, stable  -Repeat biopsy shows low grade prostate cancer   PMH: Past Medical History:  Diagnosis Date  . Arthritis   . Cancer ([facility])   . CHF (congestive heart failure) ([facility])   . Hypertension   . Myocardial infarction ([facility])   . Prostate cancer ([facility]) 10/19/2017    Surgical History: Past Surgical History:  Procedure Laterality Date  . BACK SURGERY     Spinal Fusion with 2 screws  . 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 JOINT REPLACEMENT    . LEFT HEART CATH AND CORONARY ANGIOGRAPHY N/A 02/09/2017   Procedure: Left Heart Cath and Coronary Angiography;  Surgeon: [name], MD;  Location: [facility];  Service: Cardiovascular;  Laterality: N/A;  . TEE WITHOUT CARDIOVERSION N/A 02/10/2017     Procedure: TRANSESOPHAGEAL ECHOCARDIOGRAM (TEE);  Surgeon: [name], MD;  Location: [facility];  Service: Open Heart Surgery;  Laterality: N/A;  . TOTAL KNEE ARTHROPLASTY Left 04/23/2015   Procedure: TOTAL KNEE ARTHROPLASTY;  Surgeon: Christophe Louis, MD;  Location: [facility];  Service: Orthopedics;  Laterality: Left;    Home Medications:  Allergies as of 11/24/2018   No Known Allergies     Medication List       Accurate as of November 24, 2018  2:10 PM. Always use your most recent med list.        acetaminophen 500 MG tablet Commonly known as:  TYLENOL Take 500 mg by mouth every 6 (six) hours as needed.   aspirin EC 81 MG tablet Take 81 mg by mouth daily.   atorvastatin 40 MG tablet Commonly known as:  LIPITOR Take 1 tablet (40 mg total) by mouth daily at 6 PM.   carvedilol 12.5 MG tablet Commonly known as:  COREG Take 12.5 mg by mouth 2 (two) times daily with a meal.   CERAVE Crea Apply topically.   clobetasol 0.05 % external solution Commonly known as:  TEMOVATE APPLY TO SCALP DAILY   ENBREL SURECLICK 50 MG/ML injection Generic drug:  etanercept   Fish Oil 1200 MG Caps Take 1 capsule by mouth 2 (two) times daily.   furosemide 40 MG tablet Commonly known as:  LASIX Take 1 tablet (40 mg total) by mouth daily.   HUMIRA PEN 40 MG/0.8ML Pnkt Generic drug:  Adalimumab   ketoconazole 2 % cream Commonly known as:  NIZORAL   ONE-A-DAY MENS 50+ ADVANTAGE PO Take 1 tablet by mouth daily.   spironolactone 25 MG tablet Commonly known as:  ALDACTONE Take 1 tablet (25 mg total) by mouth daily.   vitamin B-12 1000 MCG tablet Commonly known as:  CYANOCOBALAMIN Take 1,000 mcg by mouth daily.       Allergies: No Known Allergies  Family History: Family History  Problem Relation Age of Onset  . Hypertension Mother   . Transient ischemic attack Mother   . CVA Mother   . Colon cancer Father   . Hypertension Father   . Prostate cancer Father   .  Diabetes Brother   . Kidney cancer Neg Hx   . Bladder Cancer Neg Hx     Social History:  reports that he quit smoking about 13 years ago. His smoking use included cigarettes. He has a 52.50 pack-year smoking history. He has never used smokeless tobacco. He reports that he does not drink alcohol or use drugs.  ROS: UROLOGY Frequent Urination?: No Hard to postpone urination?: No Burning/pain with urination?: No Get up at night to urinate?: No Leakage of urine?: No Urine stream starts and stops?: No Trouble starting stream?: No Do you have to strain to urinate?: No Blood in urine?: No Urinary tract infection?: No Sexually transmitted disease?: No Injury to kidneys or bladder?: No Painful intercourse?: No Weak stream?: No Erection problems?: No Penile pain?: No  Gastrointestinal Nausea?: No Vomiting?: No Indigestion/heartburn?: No Diarrhea?: No Constipation?: No  Constitutional Fever: No Night sweats?: No Weight loss?: No Fatigue?: No  Skin Skin rash/lesions?: No Itching?: No  Eyes Blurred vision?: No Double vision?: No  Ears/Nose/Throat Sore throat?: No Sinus problems?: No  Hematologic/Lymphatic Swollen glands?: No Easy bruising?: No  Cardiovascular Leg swelling?: No Chest pain?: No  Respiratory Cough?: No Shortness of breath?: No  Endocrine Excessive thirst?: No  Musculoskeletal Back pain?: No Joint pain?: No  Neurological Headaches?: No Dizziness?: No  Psychologic Depression?: No Anxiety?: No  Physical Exam: BP (!) 144/73 (BP Location: Left Arm, Patient Position: Sitting, Cuff Size: Normal)   Pulse 74   Ht 5' 10.5" (1.791 m)   Wt 181 lb (82.1 kg)   BMI 25.60 kg/m   Constitutional:  Well nourished. Alert and oriented, No acute distress. HEENT: [place] AT, moist mucus membranes.  Trachea midline, no masses. Cardiovascular: No clubbing, cyanosis, or edema. Respiratory: Normal respiratory effort, no increased work of breathing. Rectal:  Patient with  normal sphincter tone. Anus and perineum without scarring or rashes. No rectal masses are appreciated. Prostate is approximately 40 grams, smooth rectal tone, no nodules are appreciated. Seminal vesicles are normal. Skin: No rashes, bruises or suspicious lesions. Neurologic: Grossly intact, no focal deficits, moving all 4 extremities. Psychiatric: Normal mood and affect.  Assessment & Plan:   1. 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 [place], [place] [address] [phone]  I, Lucas Mallow, am acting as a scribe for Dr. Nicki Reaper C. Anzleigh Slaven,  I, Abbie Sons, MD, have reviewed all documentation for this visit. The documentation on 11/24/18 for the exam, diagnosis, procedures, and orders are all accurate and complete.

## 2019-03-02 ENCOUNTER — Encounter: Payer: Self-pay | Admitting: Family Medicine

## 2019-05-15 ENCOUNTER — Other Ambulatory Visit: Payer: Self-pay | Admitting: Family Medicine

## 2019-05-15 DIAGNOSIS — C61 Malignant neoplasm of prostate: Secondary | ICD-10-CM

## 2019-05-16 ENCOUNTER — Other Ambulatory Visit: Payer: Managed Care, Other (non HMO)

## 2019-05-16 ENCOUNTER — Other Ambulatory Visit: Payer: Self-pay

## 2019-05-16 DIAGNOSIS — C61 Malignant neoplasm of prostate: Secondary | ICD-10-CM

## 2019-05-17 LAB — PSA: Prostate Specific Ag, Serum: 4.4 ng/mL — ABNORMAL HIGH (ref 0.0–4.0)

## 2019-05-26 ENCOUNTER — Ambulatory Visit: Payer: Managed Care, Other (non HMO) | Admitting: Urology

## 2019-05-30 ENCOUNTER — Encounter: Payer: Self-pay | Admitting: Urology

## 2019-05-30 ENCOUNTER — Ambulatory Visit: Payer: Managed Care, Other (non HMO) | Admitting: Urology

## 2019-05-30 ENCOUNTER — Other Ambulatory Visit: Payer: Self-pay

## 2019-05-30 VITALS — BP 115/67 | HR 72 | Ht 70.0 in | Wt 180.0 lb

## 2019-05-30 DIAGNOSIS — C61 Malignant neoplasm of prostate: Secondary | ICD-10-CM

## 2019-05-30 NOTE — Progress Notes (Signed)
05/30/2019 10:57 AM   Seth Byrd 09/09/1946 [id number]  Referring provider: Arnetha Courser, MD [address] [place],  [place] [address]  Chief Complaint  Patient presents with  . Prostate Cancer    Urologic history: 1. Clinical T1cadenocarcinoma prostate, low risk   -initial biopsy January 2018; 1/12 core Gleason 3+3 adenocarcinoma involving 3% at right apex.  -PSA 4.8, volume 36 g     -elected active surveillance.   -Confirmatory biopsy December 2018 1/12 core positive Gleason 3+3 right base involving 24%   HPI: 73 year old male presents for semiannual follow-up of low risk prostate cancer.  Since his last visit he states he has been doing well.  He denies bothersome lower urinary tract symptoms.  He denies flank, abdominal, pelvic or scrotal pain.  PSA performed on 05/16/2019 was stable at 4.4.   PMH: Past Medical History:  Diagnosis Date  . Arthritis   . Cancer ([facility])   . CHF (congestive heart failure) ([facility])   . Hypertension   . Myocardial infarction ([facility])   . Prostate cancer ([facility]) 10/19/2017    Surgical History: Past Surgical History:  Procedure Laterality Date  . BACK SURGERY     Spinal Fusion with 2 screws  . 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 JOINT REPLACEMENT    . LEFT HEART CATH AND CORONARY ANGIOGRAPHY N/A 02/09/2017   Procedure: Left Heart Cath and Coronary Angiography;  Surgeon: [name], MD;  Location: [facility];  Service: Cardiovascular;  Laterality: N/A;  . TEE WITHOUT CARDIOVERSION N/A 02/10/2017   Procedure: TRANSESOPHAGEAL ECHOCARDIOGRAM (TEE);  Surgeon: [name], MD;  Location: [facility];  Service:  Open Heart Surgery;  Laterality: N/A;  . TOTAL KNEE ARTHROPLASTY Left 04/23/2015   Procedure: TOTAL KNEE ARTHROPLASTY;  Surgeon: Christophe Louis, MD;  Location: [facility];  Service: Orthopedics;  Laterality: Left;    Home Medications:  Allergies as of 05/30/2019   No Known Allergies     Medication List       Accurate as of May 30, 2019 10:57 AM. If you have any questions, ask your nurse or doctor.        acetaminophen 500 MG tablet Commonly known as: TYLENOL Take 500 mg by mouth every 6 (six) hours as needed.   aspirin EC 81 MG tablet Take 81 mg by mouth daily.   atorvastatin 40 MG tablet Commonly known as: LIPITOR Take 1 tablet (40 mg total) by mouth daily at 6 PM.   carvedilol 12.5 MG tablet Commonly known as: COREG Take 12.5 mg by mouth 2 (two) times daily with a meal.   CeraVe Crea Apply topically.   clindamycin 1 % external solution Commonly known as: CLEOCIN T   clobetasol 0.05 % external solution Commonly known as: TEMOVATE APPLY TO SCALP DAILY   Enbrel SureClick 50 MG/ML injection Generic drug: etanercept   Entresto 49-51 MG Generic drug: sacubitril-valsartan TK 1 T PO BID   Fish Oil 1200 MG Caps Take 1 capsule by mouth 2 (two) times daily.   furosemide 40 MG tablet Commonly known as: LASIX Take 1 tablet (40 mg total) by mouth daily.   Humira Pen 40 MG/0.8ML  Pnkt Generic drug: Adalimumab   ketoconazole 2 % cream Commonly known as: NIZORAL   ONE-A-DAY MENS 50+ ADVANTAGE PO Take 1 tablet by mouth daily.   spironolactone 25 MG tablet Commonly known as: ALDACTONE Take 1 tablet (25 mg total) by mouth daily.   vitamin B-12 1000 MCG tablet Commonly known as: CYANOCOBALAMIN Take 1,000 mcg by mouth daily.       Allergies: No Known Allergies  Family History: Family History  Problem Relation Age of Onset  . Hypertension Mother   . Transient ischemic attack Mother   . CVA Mother   . Colon cancer Father   . Hypertension Father   .  Prostate cancer Father   . Diabetes Brother   . Kidney cancer Neg Hx   . Bladder Cancer Neg Hx     Social History:  reports that he quit smoking about 14 years ago. His smoking use included cigarettes. He has a 52.50 pack-year smoking history. He has never used smokeless tobacco. He reports that he does not drink alcohol or use drugs.  ROS: UROLOGY Frequent Urination?: No Hard to postpone urination?: No Burning/pain with urination?: No Get up at night to urinate?: No Leakage of urine?: No Urine stream starts and stops?: No Trouble starting stream?: No Do you have to strain to urinate?: No Blood in urine?: No Urinary tract infection?: No Sexually transmitted disease?: No Injury to kidneys or bladder?: No Painful intercourse?: No Weak stream?: No Erection problems?: No Penile pain?: No  Gastrointestinal Nausea?: No Vomiting?: No Indigestion/heartburn?: No Diarrhea?: No Constipation?: No  Constitutional Fever: No Night sweats?: No Fatigue?: No  Skin Skin rash/lesions?: No Itching?: No  Eyes Blurred vision?: No Double vision?: No  Ears/Nose/Throat Sinus problems?: No  Hematologic/Lymphatic Swollen glands?: No Easy bruising?: No  Cardiovascular Leg swelling?: No Chest pain?: No  Respiratory Cough?: No Shortness of breath?: No  Endocrine Excessive thirst?: No  Musculoskeletal Back pain?: No Joint pain?: No  Neurological Headaches?: No Dizziness?: No  Psychologic Depression?: No Anxiety?: No  Physical Exam: BP 115/67 (BP Location: Left Arm, Patient Position: Sitting, Cuff Size: Normal)   Pulse 72   Ht 5\' 10"  (1.778 m)   Wt 180 lb (81.6 kg)   BMI 25.83 kg/m   Constitutional:  Alert and oriented, No acute distress. HEENT: [place] AT, moist mucus membranes.  Trachea midline, no masses. Cardiovascular: No clubbing, cyanosis, or edema. Respiratory: Normal respiratory effort, no increased work of breathing. GI: Abdomen is soft, nontender,  nondistended, no abdominal masses GU: No CVA tenderness, prostate 40 g, smooth without nodules Skin: No rashes, bruises or suspicious lesions. Neurologic: Grossly intact, no focal deficits, moving all 4 extremities. Psychiatric: Normal mood and affect.   Assessment & Plan:   73 year-old male with T1c low risk prostate cancer.  PSA stable and DRE benign.  He desires to continue active surveillance and will follow-up in 6 months.   Abbie Sons, [facility] [address] [place], [place] [address] [phone]

## 2019-08-25 ENCOUNTER — Other Ambulatory Visit: Payer: Self-pay

## 2019-08-25 ENCOUNTER — Ambulatory Visit (INDEPENDENT_AMBULATORY_CARE_PROVIDER_SITE_OTHER): Payer: Managed Care, Other (non HMO) | Admitting: Family Medicine

## 2019-08-25 ENCOUNTER — Encounter: Payer: Self-pay | Admitting: Family Medicine

## 2019-08-25 ENCOUNTER — Telehealth: Payer: Self-pay

## 2019-08-25 VITALS — BP 98/64 | HR 56 | Temp 97.8°F | Resp 14 | Ht 72.0 in | Wt 178.0 lb

## 2019-08-25 DIAGNOSIS — J431 Panlobular emphysema: Secondary | ICD-10-CM

## 2019-08-25 DIAGNOSIS — Z5181 Encounter for therapeutic drug level monitoring: Secondary | ICD-10-CM

## 2019-08-25 DIAGNOSIS — Z0000 Encounter for general adult medical examination without abnormal findings: Secondary | ICD-10-CM

## 2019-08-25 DIAGNOSIS — E782 Mixed hyperlipidemia: Secondary | ICD-10-CM

## 2019-08-25 DIAGNOSIS — I10 Essential (primary) hypertension: Secondary | ICD-10-CM

## 2019-08-25 DIAGNOSIS — C61 Malignant neoplasm of prostate: Secondary | ICD-10-CM

## 2019-08-25 DIAGNOSIS — Z951 Presence of aortocoronary bypass graft: Secondary | ICD-10-CM

## 2019-08-25 NOTE — Progress Notes (Signed)
Patient: Seth Byrd, Male    DOB: 01-Apr-1946, 73 y.o.   MRN: [id number] Arnetha Courser, MD Visit Date: 08/25/2019  Today's Provider: Delsa Grana, PA-C   Chief Complaint  Patient presents with   Annual Exam   Subjective:   Annual physical exam:  Seth Byrd is a 72 y.o. male who presents today for health maintenance and annual & complete physical exam.  He feels well.  He reports exercising - does weekly, has a coach/trainer, tries every day Diet - healthy, no salt at all, he "watches what he eats" prepares food mostly at home He reports he is sleeping well.   A little stressful at work, works at [employer], he's working harder and staffing is low.  Sees urology - [facility] Bensimhon/Fath  Flu done at [facility] - added to chart through reconciliation  USPSTF grade A and B recommendations - reviewed and addressed today  Depression:  Phq 9 completed today by patient, was reviewed by me with patient in the room, score is  negative, pt feels good PHQ 2/9 Scores 08/25/2019 08/19/2018 08/19/2018 06/22/2018  PHQ - 2 Score 0 0 0 0  PHQ- 9 Score 0 0 - -   Depression screen [facility] 2/9 08/25/2019 08/19/2018 08/19/2018 06/22/2018 01/20/2018  Decreased Interest 0 0 0 0 0  Down, Depressed, Hopeless 0 0 0 0 0  PHQ - 2 Score 0 0 0 0 0  Altered sleeping 0 0 - - -  Tired, decreased energy 0 0 - - -  Change in appetite 0 0 - - -  Feeling bad or failure about yourself  0 0 - - -  Trouble concentrating 0 0 - - -  Moving slowly or fidgety/restless 0 0 - - -  Suicidal thoughts 0 0 - - -  PHQ-9 Score 0 0 - - -  Difficult doing work/chores Not difficult at all Not difficult at all - - -   Hep C Screening: done STD testing and prevention (HIV/chl/gon/syphilis): none today Prostate cancer: Has prostate CA - see urology for this No results found for: PSA Intimate partner violence:feels safe  Advanced Care Planning:  A voluntary discussion about advance care planning including  the explanation and discussion of advance directives.  Discussed health care proxy and Living will, and the patient was able to identify a health care proxy as his daughter Evelyn Kuyper or Gwynn Burly daughter, no Advanced Directive   Skin cancer:  No skin CA hx  Colorectal cancer:  colonoscopy is cologuard done 2018, due next year    Lung cancer:   Low Dose CT Chest recommended if Age 67-80 years, 30 pack-year currently smoking OR have quit w/in 15years. Patient does qualify.   Social History   Tobacco Use   Smoking status: Former Smoker    Packs/day: 1.50    Years: 35.00    Pack years: 52.50    Types: Cigarettes    Quit date: 04/09/2005    Years since quitting: 14.3   Smokeless tobacco: Never Used  Substance Use Topics   Alcohol use: No    Alcohol screening:   Office Visit from 08/25/2019 in [facility]  AUDIT-C Score  0     AAA:  The USPSTF recommends one-time screening with ultrasonography in men ages 41 to 42 years who have ever smoked  ECG: - Done through cardiology   Blood pressure/Hypertension: BP Readings from Last 3 Encounters:  08/25/19 98/64  05/30/19 115/67  11/24/18 (!) 144/73  Weight/Obesity: Wt Readings from Last 3 Encounters:  08/25/19 178 lb (80.7 kg)  05/30/19 180 lb (81.6 kg)  11/24/18 181 lb (82.1 kg)   BMI Readings from Last 3 Encounters:  08/25/19 24.14 kg/m  05/30/19 25.83 kg/m  11/24/18 25.60 kg/m    Lipids:  Lab Results  Component Value Date   CHOL CANCELED 06/22/2018   CHOL 111 06/22/2018   CHOL 118 01/20/2018   Lab Results  Component Value Date   HDL CANCELED 06/22/2018   HDL 36 (L) 06/22/2018   HDL 37 (L) 01/20/2018   Lab Results  Component Value Date   LDLCALC 60 06/22/2018   LDLCALC 66 01/20/2018   LDLCALC 64 09/24/2017   Lab Results  Component Value Date   TRIG CANCELED 06/22/2018   TRIG 75 06/22/2018   TRIG 73 01/20/2018   Lab Results  Component Value Date   CHOLHDL 3.1 06/22/2018    CHOLHDL 3.2 01/20/2018   CHOLHDL 2.9 09/24/2017   No results found for: LDLDIRECT Based on the results of lipid panel his/her cardiovascular risk factor ( using [place] )  in the next 10 years is : The ASCVD Risk score Mikey Bussing DC Jr., et al., 2013) failed to calculate for the following reasons:   The patient has a prior MI or stroke diagnosis Glucose:  Glucose  Date Value Ref Range Status  06/22/2018 103 (H) 65 - 99 mg/dL Final  01/20/2018 102 (H) 65 - 99 mg/dL Final  09/24/2017 95 65 - 99 mg/dL Final   Glucose, Bld  Date Value Ref Range Status  07/17/2018 141 (H) 70 - 99 mg/dL Final  05/03/2017 108 (H) 65 - 99 mg/dL Final  02/25/2017 90 65 - 99 mg/dL Final   Glucose-Capillary  Date Value Ref Range Status  07/17/2018 132 (H) 70 - 99 mg/dL Final  02/17/2017 150 (H) 65 - 99 mg/dL Final  02/17/2017 90 65 - 99 mg/dL Final   Lab Results  Component Value Date   HGBA1C 5.2 06/22/2018   Social History      He  reports that he quit smoking about 14 years ago. His smoking use included cigarettes. He has a 52.50 pack-year smoking history. He has never used smokeless tobacco. He reports that he does not drink alcohol or use drugs.       Social History   Socioeconomic History   Marital status: Divorced    Spouse name: Not on file   Number of children: 2   Years of education: 12   Highest education level: Associate degree: academic program  Occupational History   Not on file  Social Needs   Financial resource strain: Not hard at all   Food insecurity    Worry: Never true    Inability: Never true   Transportation needs    Medical: No    Non-medical: No  Tobacco Use   Smoking status: Former Smoker    Packs/day: 1.50    Years: 35.00    Pack years: 52.50    Types: Cigarettes    Quit date: 04/09/2005    Years since quitting: 14.3   Smokeless tobacco: Never Used  Substance and Sexual Activity   Alcohol use: No   Drug use: No   Sexual activity: Yes    Birth  control/protection: None  Lifestyle   Physical activity    Days per week: 0 days    Minutes per session: 0 min   Stress: Not at all  Relationships   Social connections  Talks on phone: Twice a week    Gets together: Twice a week    Attends religious service: More than 4 times per year    Active member of club or organization: No    Attends meetings of clubs or organizations: Never    Relationship status: Divorced  Other Topics Concern   Not on file  Social History Narrative   Not on file        Social History   Socioeconomic History   Marital status: Divorced    Spouse name: Not on file   Number of children: 2   Years of education: 12   Highest education level: Associate degree: academic program  Occupational History   Not on file  Social Needs   Financial resource strain: Not hard at all   Food insecurity    Worry: Never true    Inability: Never true   Transportation needs    Medical: No    Non-medical: No  Tobacco Use   Smoking status: Former Smoker    Packs/day: 1.50    Years: 35.00    Pack years: 52.50    Types: Cigarettes    Quit date: 04/09/2005    Years since quitting: 14.3   Smokeless tobacco: Never Used  Substance and Sexual Activity   Alcohol use: No   Drug use: No   Sexual activity: Yes    Birth control/protection: None  Lifestyle   Physical activity    Days per week: 0 days    Minutes per session: 0 min   Stress: Not at all  Relationships   Social connections    Talks on phone: Twice a week    Gets together: Twice a week    Attends religious service: More than 4 times per year    Active member of club or organization: No    Attends meetings of clubs or organizations: Never    Relationship status: Divorced   Intimate partner violence    Fear of current or ex partner: No    Emotionally abused: No    Physically abused: No    Forced sexual activity: No  Other Topics Concern   Not on file  Social History Narrative     Not on file   Family History        Family Status  Relation Name Status   Mother  Deceased   Father  Deceased   Brother oldest Deceased   Brother youngest Deceased       old age   Neg Hx  (Not Specified)        His family history includes CVA in his mother; Colon cancer in his father; Diabetes in his brother; Hypertension in his father and mother; Prostate cancer in his father; Transient ischemic attack in his mother. There is no history of Kidney cancer or Bladder Cancer.       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   Chest pain, rule out acute myocardial infarction 02/09/2017   History of heart attack 02/09/2017   Essential hypertension 11/25/2015   Hyperlipidemia 11/25/2015   Panlobular emphysema ([place]) 11/25/2015   Status post total left knee replacement 11/25/2015   Primary osteoarthritis involving multiple joints 11/25/2015   Primary osteoarthritis of knee 04/23/2015   Past Surgical History:  Procedure Laterality Date   BACK SURGERY     Spinal Fusion with 2 screws   CORONARY ARTERY BYPASS GRAFT N/A 02/10/2017   Procedure: CORONARY ARTERY BYPASS GRAFTING (CABG), ON PUMP, TIMES THREE, USING LEFT INTERNAL MAMMARY ARTERY AND ENDOSCOPICALLY HARVESTED BILATERAL GREATER SAPHENOUS VEINS;  Surgeon: [name], MD;  Location: [place];  Service: Open Heart Surgery;  Laterality: N/A;  LIMA to LAD SVG  to Diag 1 SVG to [place] CATH AND CORONARY ANGIOGRAPHY N/A 02/09/2017   Procedure: Left Heart Cath and Coronary Angiography;  Surgeon: [name], MD;  Location: [place] [facility];  Service: Cardiovascular;  Laterality: N/A;   TEE WITHOUT CARDIOVERSION N/A 02/10/2017   Procedure: TRANSESOPHAGEAL ECHOCARDIOGRAM (TEE);  Surgeon: [name], MD;  Location: [place];  Service: Open Heart Surgery;  Laterality: N/A;   TOTAL KNEE ARTHROPLASTY Left 04/23/2015   Procedure: TOTAL KNEE ARTHROPLASTY;  Surgeon: Christophe Louis, MD;  Location: [facility];  Service: Orthopedics;  Laterality: Left;     Current Outpatient Medications:    aspirin EC 81 MG tablet, Take 81 mg by mouth daily., Disp: , Rfl:    atorvastatin (LIPITOR) 40 MG tablet, Take 1 tablet (40 mg total) by mouth daily at 6 PM., Disp: , Rfl:    carvedilol (COREG) 12.5 MG tablet, Take 12.5 mg by mouth 2 (two) times daily with a meal., Disp: , Rfl: 3   clindamycin (CLEOCIN T) 1 % external solution, , Disp: , Rfl:    clobetasol (TEMOVATE) 0.05 % external solution, APPLY TO SCALP DAILY, Disp: , Rfl: 5   Emollient (CERAVE) CREA, Apply topically., Disp: , Rfl:    ENBREL SURECLICK 50 MG/ML injection, , Disp: , Rfl:    ENTRESTO 49-51 MG, TK 1 T PO BID, Disp: , Rfl:    furosemide (LASIX) 40 MG tablet, Take 1 tablet (40 mg total) by mouth daily., Disp: 30 tablet, Rfl:    HUMIRA PEN 40 MG/0.8ML PNKT, , Disp: , Rfl:    ketoconazole (NIZORAL) 2 % cream, , Disp: , Rfl:    Multiple Vitamins-Minerals (ONE-A-DAY MENS 50+ ADVANTAGE PO), Take 1 tablet by mouth daily., Disp: , Rfl:    Omega-3 Fatty Acids (FISH OIL) 1200 MG CAPS, Take 1 capsule by mouth 2 (two) times daily. , Disp: , Rfl:    spironolactone (ALDACTONE) 25 MG tablet, Take 1 tablet (25 mg total) by mouth daily., Disp: , Rfl:    vitamin B-12 (CYANOCOBALAMIN) 1000 MCG tablet, Take 1,000 mcg by mouth daily. , Disp: , Rfl:    acetaminophen  (TYLENOL) 500 MG tablet, Take 500 mg by mouth every 6 (six) hours as needed., Disp: , Rfl:   No Known Allergies  Patient Care Team: Lada, Satira Anis, MD as PCP - General (Family Medicine) Ubaldo Glassing Javier Docker, MD as Consulting Physician (Cardiology) Bensimhon, Shaune Pascal, MD as Consulting Physician (Cardiology) Abbie Sons, MD (Urology)  I personally reviewed active problem list, medication list, allergies, family history, social history, health maintenance, notes from last encounter, lab  results, imaging with the patient/caregiver today.  Review of Systems  Constitutional: Negative.  Negative for activity change, appetite change, fatigue and unexpected weight change.  HENT: Negative.   Eyes: Negative.   Respiratory: Negative.  Negative for shortness of breath.   Cardiovascular: Negative.  Negative for chest pain, palpitations and leg swelling.  Gastrointestinal: Negative.  Negative for abdominal pain and blood in stool.  Endocrine: Negative.   Genitourinary: Negative.  Negative for decreased urine volume, difficulty urinating, testicular pain and urgency.  Skin: Negative.  Negative for color change and pallor.  Allergic/Immunologic: Negative.   Neurological: Negative.  Negative for syncope, weakness, light-headedness and numbness.  Psychiatric/Behavioral: Negative.  Negative for confusion, dysphoric mood, self-injury and suicidal ideas. The patient is not nervous/anxious.   All other systems reviewed and are negative.         Objective:   Vitals:  Vitals:   08/25/19 0810  BP: 98/64  Pulse: (!) 56  Resp: 14  Temp: 97.8 F (36.6 C)  SpO2: 99%  Weight: 178 lb (80.7 kg)  Height: 6' (1.829 m)    Body mass index is 24.14 kg/m.  Physical Exam Vitals signs and nursing note reviewed.  Constitutional:      General: He is not in acute distress.    Appearance: Normal appearance. He is well-developed. He is not ill-appearing, toxic-appearing or diaphoretic.     Interventions:  Face mask in place.  HENT:     Head: Normocephalic and atraumatic.     Jaw: No trismus.     Right Ear: External ear normal.     Left Ear: External ear normal.  Eyes:     General: Lids are normal. No scleral icterus.    Conjunctiva/sclera: Conjunctivae normal.     Pupils: Pupils are equal, round, and reactive to light.  Neck:     Musculoskeletal: Normal range of motion and neck supple.     Trachea: Trachea and phonation normal. No tracheal deviation.  Cardiovascular:     Rate and Rhythm: Regular rhythm. Bradycardia present.     Pulses: Normal pulses.          Radial pulses are 2+ on the right side and 2+ on the left side.       Posterior tibial pulses are 2+ on the right side and 2+ on the left side.     Heart sounds: Normal heart sounds. No murmur. No friction rub. No gallop.   Pulmonary:     Effort: Pulmonary effort is normal. No respiratory distress.     Breath sounds: Normal breath sounds. No stridor. No wheezing, rhonchi or rales.  Abdominal:     General: Bowel sounds are normal. There is no distension.     Palpations: Abdomen is soft.     Tenderness: There is no abdominal tenderness. There is no guarding or rebound.  Musculoskeletal: Normal range of motion.     Right lower leg: No edema.     Left lower leg: No edema.  Skin:    General: Skin is warm and dry.     Capillary Refill: Capillary refill takes less than 2 seconds.     Coloration: Skin is not jaundiced.     Findings: No rash.     Nails: There is no clubbing.   Neurological:     Mental Status: He is alert.     Cranial Nerves: No dysarthria or facial asymmetry.     Motor: No tremor or abnormal muscle tone.     Gait: Gait  normal.  Psychiatric:        Mood and Affect: Mood normal.        Speech: Speech normal.        Behavior: Behavior normal. Behavior is cooperative.      PHQ2/9: Depression screen [facility] 2/9 08/25/2019 08/19/2018 08/19/2018 06/22/2018 01/20/2018  Decreased Interest 0 0 0 0 0  Down, Depressed,  Hopeless 0 0 0 0 0  PHQ - 2 Score 0 0 0 0 0  Altered sleeping 0 0 - - -  Tired, decreased energy 0 0 - - -  Change in appetite 0 0 - - -  Feeling bad or failure about yourself  0 0 - - -  Trouble concentrating 0 0 - - -  Moving slowly or fidgety/restless 0 0 - - -  Suicidal thoughts 0 0 - - -  PHQ-9 Score 0 0 - - -  Difficult doing work/chores Not difficult at all Not difficult at all - - -    Fall Risk: Fall Risk  08/25/2019 08/19/2018 06/22/2018 01/20/2018 06/22/2017  Falls in the past year? 0 No No No No  Number falls in past yr: 0 - - - -  Injury with Fall? 0 - - - -    Functional Status Survey: Is the patient deaf or have difficulty hearing?: No Does the patient have difficulty seeing, even when wearing glasses/contacts?: No Does the patient have difficulty concentrating, remembering, or making decisions?: No Does the patient have difficulty walking or climbing stairs?: No Does the patient have difficulty dressing or bathing?: No Does the patient have difficulty doing errands alone such as visiting a doctor's office or shopping?: No   Assessment & Plan:    CPE completed today   Prostate cancer screening and PSA options (with potential risks and benefits of testing vs not testing) were discussed along with recent recs/guidelines, shared decision making and handout/information given to pt today   USPSTF grade A and B recommendations reviewed with patient; age-appropriate recommendations, preventive care, screening tests, etc discussed and encouraged; healthy living encouraged; see AVS for patient education given to patient   Discussed importance of 150 minutes of physical activity weekly, AHA exercise recommendations given to pt in AVS/handout   Discussed importance of healthy diet:  eating lean meats and proteins, avoiding trans fats and saturated fats, avoid simple sugars and excessive carbs in diet, eat 6 servings of fruit/vegetables daily and drink plenty of water and  avoid sweet beverages.  DASH diet reviewed if pt has HTN   Recommended pt to do annual eye exam and routine dental exams/cleanings   Reviewed Health Maintenance: Health Maintenance  Topic Date Due   INFLUENZA VACCINE  06/10/2019   Fecal DNA (Cologuard)  10/04/2020   TETANUS/TDAP  09/25/2027   Hepatitis C Screening  Completed   PNA vac Low Risk Adult  Completed     Immunizations: Immunization History  Administered Date(s) Administered   Influenza, High Dose Seasonal PF 07/31/2015, 08/07/2016, 08/13/2017, 08/19/2018   Influenza,inj,quad, With Preservative 07/31/2016   Pneumococcal Conjugate-13 07/31/2015   Pneumococcal-Unspecified 07/31/2016   Tdap 09/24/2017     ICD-10-CM   1. Adult general medical exam  Z00.00 Lipid panel    CBC with Differential/Platelet    Hemoglobin A1c    Comprehensive metabolic panel  2. Encounter for medication monitoring  Z51.81 Lipid panel    CBC with Differential/Platelet    Hemoglobin A1c    Comprehensive metabolic panel   almost all meds managed by cardiology -  labs today, will CC cards, he has appt next week  3. Essential hypertension  I10 Comprehensive metabolic panel   soft BP, asx, pt will f/up with cardiology  4. Panlobular emphysema (H[facility])  J43.1    no sx, found on low dose CT screening   5. Mixed hyperlipidemia  E78.2 Lipid panel    Comprehensive metabolic panel   on meds, no SE  6. Prostate cancer [facility])  C61    per urology - pt sees 2x a year.  No active tx he states  7. S/P CABG x 3  Z95.1      Delsa Grana, PA-C 08/25/19 8:33 AM  [facility]

## 2019-08-25 NOTE — Telephone Encounter (Signed)
Left voicemail for pt to inform him that it is time for his annual lung cancer screening. Instructed pt to return call and confirm information prior to CT scan being scheduled.

## 2019-08-25 NOTE — Patient Instructions (Signed)
Health Maintenance  Topic Date Due  . Fecal DNA (Cologuard)  10/04/2020  . TETANUS/TDAP  09/25/2027  . INFLUENZA VACCINE  Completed  . Hepatitis C Screening  Completed  . PNA vac Low Risk Adult  Completed   Will order the lung cancer screening CT And order ultrasound of your abdominal aorta   Health Maintenance After Age 72 After age 43, you are at a higher risk for certain long-term diseases and infections as well as injuries from falls. Falls are a major cause of broken bones and head injuries in people who are older than age 5. Getting regular preventive care can help to keep you healthy and well. Preventive care includes getting regular testing and making lifestyle changes as recommended by your health care provider. Talk with your health care provider about:  Which screenings and tests you should have. A screening is a test that checks for a disease when you have no symptoms.  A diet and exercise plan that is right for you. What should I know about screenings and tests to prevent falls? Screening and testing are the best ways to find a health problem early. Early diagnosis and treatment give you the best chance of managing medical conditions that are common after age 58. Certain conditions and lifestyle choices may make you more likely to have a fall. Your health care provider may recommend:  Regular vision checks. Poor vision and conditions such as cataracts can make you more likely to have a fall. If you wear glasses, make sure to get your prescription updated if your vision changes.  Medicine review. Work with your health care provider to regularly review all of the medicines you are taking, including over-the-counter medicines. Ask your health care provider about any side effects that may make you more likely to have a fall. Tell your health care provider if any medicines that you take make you feel dizzy or sleepy.  Osteoporosis screening. Osteoporosis is a condition that causes  the bones to get weaker. This can make the bones weak and cause them to break more easily.  Blood pressure screening. Blood pressure changes and medicines to control blood pressure can make you feel dizzy.  Strength and balance checks. Your health care provider may recommend certain tests to check your strength and balance while standing, walking, or changing positions.  Foot health exam. Foot pain and numbness, as well as not wearing proper footwear, can make you more likely to have a fall.  Depression screening. You may be more likely to have a fall if you have a fear of falling, feel emotionally low, or feel unable to do activities that you used to do.  Alcohol use screening. Using too much alcohol can affect your balance and may make you more likely to have a fall. What actions can I take to lower my risk of falls? General instructions  Talk with your health care provider about your risks for falling. Tell your health care provider if: ? You fall. Be sure to tell your health care provider about all falls, even ones that seem minor. ? You feel dizzy, sleepy, or off-balance.  Take over-the-counter and prescription medicines only as told by your health care provider. These include any supplements.  Eat a healthy diet and maintain a healthy weight. A healthy diet includes low-fat dairy products, low-fat (lean) meats, and fiber from whole grains, beans, and lots of fruits and vegetables. Home safety  Remove any tripping hazards, such as rugs, cords, and clutter.  Install safety equipment such as grab bars in bathrooms and safety rails on stairs.  Keep rooms and walkways well-lit. Activity   Follow a regular exercise program to stay fit. This will help you maintain your balance. Ask your health care provider what types of exercise are appropriate for you.  If you need a cane or walker, use it as recommended by your health care provider.  Wear supportive shoes that have nonskid soles.  Lifestyle  Do not drink alcohol if your health care provider tells you not to drink.  If you drink alcohol, limit how much you have: ? 0-1 drink a day for women. ? 0-2 drinks a day for men.  Be aware of how much alcohol is in your drink. In the [place] one drink equals one typical bottle of beer (12 oz), one-half glass of wine (5 oz), or one shot of hard liquor (1 oz).  Do not use any products that contain nicotine or tobacco, such as cigarettes and e-cigarettes. If you need help quitting, ask your health care provider. Summary  Having a healthy lifestyle and getting preventive care can help to protect your health and wellness after age 67.  Screening and testing are the best way to find a health problem early and help you avoid having a fall. Early diagnosis and treatment give you the best chance for managing medical conditions that are more common for people who are older than age 56.  Falls are a major cause of broken bones and head injuries in people who are older than age 19. Take precautions to prevent a fall at home.  Work with your health care provider to learn what changes you can make to improve your health and wellness and to prevent falls. This information is not intended to replace advice given to you by your health care provider. Make sure you discuss any questions you have with your health care provider. Document Released: 09/08/2017 Document Revised: 02/16/2019 Document Reviewed: 09/08/2017 Elsevier Patient Education  2020 [employer].

## 2019-08-26 LAB — HEMOGLOBIN A1C
Est. average glucose Bld gHb Est-mCnc: 88 mg/dL
Hgb A1c MFr Bld: 4.7 % — ABNORMAL LOW (ref 4.8–5.6)

## 2019-08-26 LAB — COMPREHENSIVE METABOLIC PANEL
ALT: 22 IU/L (ref 0–44)
AST: 24 IU/L (ref 0–40)
Albumin/Globulin Ratio: 1.6 (ref 1.2–2.2)
Albumin: 4.7 g/dL (ref 3.7–4.7)
Alkaline Phosphatase: 51 IU/L (ref 39–117)
BUN/Creatinine Ratio: 21 (ref 10–24)
BUN: 20 mg/dL (ref 8–27)
Bilirubin Total: 1.1 mg/dL (ref 0.0–1.2)
CO2: 24 mmol/L (ref 20–29)
Calcium: 10 mg/dL (ref 8.6–10.2)
Chloride: 101 mmol/L (ref 96–106)
Creatinine, Ser: 0.97 mg/dL (ref 0.76–1.27)
GFR calc Af Amer: 90 mL/min/{1.73_m2} (ref >59)
GFR calc non Af Amer: 78 mL/min/{1.73_m2} (ref >59)
Globulin, Total: 2.9 g/dL (ref 1.5–4.5)
Glucose: 91 mg/dL (ref 65–99)
Potassium: 4.3 mmol/L (ref 3.5–5.2)
Sodium: 139 mmol/L (ref 134–144)
Total Protein: 7.6 g/dL (ref 6.0–8.5)

## 2019-08-26 LAB — CBC WITH DIFFERENTIAL/PLATELET
Basophils Absolute: 0 10*3/uL (ref 0.0–0.2)
Basos: 1 %
EOS (ABSOLUTE): 0.2 10*3/uL (ref 0.0–0.4)
Eos: 4 %
Hematocrit: 40.7 % (ref 37.5–51.0)
Hemoglobin: 13.8 g/dL (ref 13.0–17.7)
Immature Grans (Abs): 0 10*3/uL (ref 0.0–0.1)
Immature Granulocytes: 0 %
Lymphocytes Absolute: 2 10*3/uL (ref 0.7–3.1)
Lymphs: 46 %
MCH: 32.4 pg (ref 26.6–33.0)
MCHC: 33.9 g/dL (ref 31.5–35.7)
MCV: 96 fL (ref 79–97)
Monocytes Absolute: 0.5 10*3/uL (ref 0.1–0.9)
Monocytes: 11 %
Neutrophils Absolute: 1.6 10*3/uL (ref 1.4–7.0)
Neutrophils: 38 %
Platelets: 161 10*3/uL (ref 150–450)
RBC: 4.26 x10E6/uL (ref 4.14–5.80)
RDW: 11.5 % — ABNORMAL LOW (ref 11.6–15.4)
WBC: 4.3 10*3/uL (ref 3.4–10.8)

## 2019-08-26 LAB — LIPID PANEL
Chol/HDL Ratio: 3 ratio (ref 0.0–5.0)
Cholesterol, Total: 106 mg/dL (ref 100–199)
HDL: 35 mg/dL — ABNORMAL LOW (ref >39)
LDL Chol Calc (NIH): 56 mg/dL (ref 0–99)
Triglycerides: 73 mg/dL (ref 0–149)
VLDL Cholesterol Cal: 15 mg/dL (ref 5–40)

## 2019-08-26 LAB — SPECIMEN STATUS REPORT

## 2019-08-28 ENCOUNTER — Telehealth: Payer: Self-pay | Admitting: *Deleted

## 2019-08-28 DIAGNOSIS — Z87891 Personal history of nicotine dependence: Secondary | ICD-10-CM

## 2019-08-28 DIAGNOSIS — Z122 Encounter for screening for malignant neoplasm of respiratory organs: Secondary | ICD-10-CM

## 2019-08-28 NOTE — Telephone Encounter (Signed)
Patient has been notified that annual lung cancer screening low dose CT scan is due currently or will be in near future. Confirmed that patient is within the age range of 55-77, and asymptomatic, (no signs or symptoms of lung cancer). Patient denies illness that would prevent curative treatment for lung cancer if found. Verified smoking history, (former, quit 2006, 52.5 pack year). The shared decision making visit was done 08/29/18. Patient is agreeable for CT scan being scheduled.

## 2019-08-30 ENCOUNTER — Encounter: Payer: Self-pay | Admitting: *Deleted

## 2019-09-22 ENCOUNTER — Other Ambulatory Visit: Payer: Self-pay

## 2019-09-22 ENCOUNTER — Ambulatory Visit
Admission: RE | Admit: 2019-09-22 | Discharge: 2019-09-22 | Disposition: A | Discharge status: Home / Self Care | Payer: Managed Care, Other (non HMO) | Source: Ambulatory Visit | Attending: Oncology | Admitting: Oncology

## 2019-09-22 DIAGNOSIS — Z122 Encounter for screening for malignant neoplasm of respiratory organs: Secondary | ICD-10-CM | POA: Diagnosis not present

## 2019-09-22 DIAGNOSIS — Z87891 Personal history of nicotine dependence: Secondary | ICD-10-CM | POA: Diagnosis present

## 2019-09-26 ENCOUNTER — Encounter: Payer: Self-pay | Admitting: *Deleted

## 2019-10-10 ENCOUNTER — Ambulatory Visit: Payer: Self-pay

## 2019-10-10 NOTE — Telephone Encounter (Signed)
Called patient left VM to return call to office. To discuss foot injury/pain.

## 2019-10-10 NOTE — Telephone Encounter (Signed)
  Answer Assessment - Initial Assessment Questions 1. ONSET: "When did the pain start?"     Last night  2. LOCATION: "Where is the pain located?"     Right ankle 3. PAIN: "How bad is the pain?"    (Scale 1-10; or mild, moderate, severe)  - MILD (1-3): doesn't interfere with normal activities   - MODERATE (4-7): interferes with normal activities (e.g., work or school) or awakens from sleep, limping   - SEVERE (8-10): excruciating pain, unable to do any normal activities, unable to walk      mild 4. WORK OR EXERCISE: "Has there been any recent work or exercise that involved this part of the body?"      no 5. CAUSE: "What do you think is causing the ankle pain?"     Fall.  6. OTHER SYMPTOMS: "Do you have any other symptoms?" (e.g., calf pain, rash, fever, swelling)     na 7. PREGNANCY: "Is there any chance you are pregnant?" "When was your last menstrual period?"     Na.  Protocols used: ANKLE PAIN-A-AH

## 2019-10-10 NOTE — Telephone Encounter (Signed)
Out going cal to Patient who reports that he fell last night and injured his ankle.  Able to bare weight. Is at work today.  Rate the pain mild.  Denies swelling .  Recommend that Patient call back if Sx.  Worsen.  Voiced understanding.

## 2019-11-22 ENCOUNTER — Other Ambulatory Visit: Payer: Self-pay

## 2019-11-22 DIAGNOSIS — C61 Malignant neoplasm of prostate: Secondary | ICD-10-CM

## 2019-11-23 ENCOUNTER — Other Ambulatory Visit: Payer: Managed Care, Other (non HMO)

## 2019-11-23 ENCOUNTER — Other Ambulatory Visit: Payer: Self-pay

## 2019-11-23 DIAGNOSIS — C61 Malignant neoplasm of prostate: Secondary | ICD-10-CM

## 2019-11-24 LAB — PSA: Prostate Specific Ag, Serum: 4.7 ng/mL — ABNORMAL HIGH (ref 0.0–4.0)

## 2019-11-30 ENCOUNTER — Other Ambulatory Visit: Payer: Self-pay

## 2019-11-30 ENCOUNTER — Ambulatory Visit: Payer: Managed Care, Other (non HMO) | Admitting: Urology

## 2019-11-30 ENCOUNTER — Encounter: Payer: Self-pay | Admitting: Urology

## 2019-11-30 VITALS — BP 132/72 | HR 74 | Ht 70.0 in | Wt 178.0 lb

## 2019-11-30 DIAGNOSIS — C61 Malignant neoplasm of prostate: Secondary | ICD-10-CM

## 2019-11-30 NOTE — Progress Notes (Signed)
11/30/2019 2:23 PM   Rodell Perna September 17, 1946 [id number]  Referring provider: Arnetha Courser, MD [address] [place],  [place] [address]  Chief Complaint  Patient presents with  . Prostate Cancer    Urologic history: 1. Clinical T1cadenocarcinoma prostate, low risk  -biopsy 11/2016; 1/12 core Gleason 3+3 adenocarcinoma involving 3% at right apex. -PSA 4.8, volume 36 g                         -elected active surveillance.  -Confirmatory biopsy December 2018 1/12 core positive Gleason 3+3 right base involving 1%    HPI: 74 y.o. male presents for semiannual follow-up of prostate cancer on active surveillance.  He states he has been doing well and has no complaints.  No bothersome lower urinary tract symptoms, dysuria or gross hematuria.  PSA 11/23/2019 stable at 4.7.   PMH: Past Medical History:  Diagnosis Date  . Arthritis   . Cancer ([facility])   . CHF (congestive heart failure) ([facility])   . Hypertension   . Myocardial infarction ([facility])   . Prostate cancer ([facility]) 10/19/2017    Surgical History: Past Surgical History:  Procedure Laterality Date  . BACK SURGERY     Spinal Fusion with 2 screws  . 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 JOINT REPLACEMENT    . LEFT HEART CATH AND CORONARY ANGIOGRAPHY N/A 02/09/2017   Procedure: Left Heart Cath and Coronary Angiography;  Surgeon: [name], MD;  Location: [facility];  Service: Cardiovascular;  Laterality: N/A;  . TEE WITHOUT CARDIOVERSION N/A 02/10/2017   Procedure: TRANSESOPHAGEAL ECHOCARDIOGRAM (TEE);  Surgeon: [name], MD;  Location: [facility];  Service: Open Heart Surgery;  Laterality:  N/A;  . TOTAL KNEE ARTHROPLASTY Left 04/23/2015   Procedure: TOTAL KNEE ARTHROPLASTY;  Surgeon: Christophe Louis, MD;  Location: [facility];  Service: Orthopedics;  Laterality: Left;    Home Medications:  Allergies as of 11/30/2019   No Known Allergies     Medication List       Accurate as of November 30, 2019  2:23 PM. If you have any questions, ask your nurse or doctor.        acetaminophen 500 MG tablet Commonly known as: TYLENOL Take 500 mg by mouth every 6 (six) hours as needed.   aspirin EC 81 MG tablet Take 81 mg by mouth daily.   atorvastatin 40 MG tablet Commonly known as: LIPITOR Take 1 tablet (40 mg total) by mouth daily at 6 PM.   carvedilol 12.5 MG tablet Commonly known as: COREG Take 12.5 mg by mouth 2 (two) times daily with a meal.   CeraVe Crea Apply topically.   clindamycin 1 % external solution Commonly known as: CLEOCIN T   clobetasol 0.05 % external solution Commonly known as: TEMOVATE APPLY TO SCALP DAILY   Enbrel SureClick 50 MG/ML injection Generic drug: etanercept   Entresto 49-51 MG Generic drug: sacubitril-valsartan TK 1 T PO BID   Fish Oil 1200 MG Caps Take 1 capsule by mouth 2 (two) times daily.   furosemide 40 MG tablet Commonly known as: LASIX Take 1 tablet (40 mg  total) by mouth daily.   Humira Pen 40 MG/0.8ML Pnkt Generic drug: Adalimumab   ketoconazole 2 % cream Commonly known as: NIZORAL   ONE-A-DAY MENS 50+ ADVANTAGE PO Take 1 tablet by mouth daily.   spironolactone 25 MG tablet Commonly known as: ALDACTONE Take 1 tablet (25 mg total) by mouth daily.   vitamin B-12 1000 MCG tablet Commonly known as: CYANOCOBALAMIN Take 1,000 mcg by mouth daily.       Allergies: No Known Allergies  Family History: Family History  Problem Relation Age of Onset  . Hypertension Mother   . Transient ischemic attack Mother   . CVA Mother   . Colon cancer Father   . Hypertension Father   . Prostate cancer Father   .  Diabetes Brother   . Kidney cancer Neg Hx   . Bladder Cancer Neg Hx     Social History:  reports that he quit smoking about 14 years ago. His smoking use included cigarettes. He has a 52.50 pack-year smoking history. He has never used smokeless tobacco. He reports that he does not drink alcohol or use drugs.  ROS: UROLOGY Frequent Urination?: No Hard to postpone urination?: No Burning/pain with urination?: No Get up at night to urinate?: No Leakage of urine?: No Urine stream starts and stops?: No Trouble starting stream?: No Do you have to strain to urinate?: No Blood in urine?: No Urinary tract infection?: No Sexually transmitted disease?: No Injury to kidneys or bladder?: No Painful intercourse?: No Weak stream?: No Erection problems?: No Penile pain?: No  Gastrointestinal Nausea?: No Vomiting?: No Indigestion/heartburn?: No Diarrhea?: No Constipation?: No  Constitutional Fever: No Night sweats?: No Weight loss?: No Fatigue?: No  Skin Skin rash/lesions?: No Itching?: No  Eyes Blurred vision?: No Double vision?: No  Ears/Nose/Throat Sore throat?: No Sinus problems?: No  Hematologic/Lymphatic Swollen glands?: No Easy bruising?: No  Cardiovascular Leg swelling?: No Chest pain?: No  Respiratory Cough?: No Shortness of breath?: No  Endocrine Excessive thirst?: No  Musculoskeletal Back pain?: No Joint pain?: No  Neurological Headaches?: No Dizziness?: No  Psychologic Depression?: No Anxiety?: No  Physical Exam: BP 132/72 (BP Location: Left Arm, Patient Position: Sitting, Cuff Size: Normal)   Pulse 74   Ht 5\' 10"  (1.778 m)   Wt 178 lb (80.7 kg)   BMI 25.54 kg/m   Constitutional:  Alert and oriented, No acute distress. HEENT: [place] AT, moist mucus membranes.  Trachea midline, no masses. Cardiovascular: No clubbing, cyanosis, or edema. Respiratory: Normal respiratory effort, no increased work of breathing. GI: Abdomen is soft, nontender,  nondistended, no abdominal masses GU: Prostate 40 g, smooth without nodules Skin: No rashes, bruises or suspicious lesions. Neurologic: Grossly intact, no focal deficits, moving all 4 extremities. Psychiatric: Normal mood and affect.    Assessment & Plan:    - T1c low risk prostate cancer Stable PSA/DRE.  He desires to continue surveillance and will follow up in 6 months.   Abbie Sons, [place] [address], [place], [place] [address] [phone]

## 2019-12-03 ENCOUNTER — Encounter: Payer: Self-pay | Admitting: Urology

## 2020-05-28 ENCOUNTER — Other Ambulatory Visit: Payer: Self-pay | Admitting: *Deleted

## 2020-05-28 DIAGNOSIS — C61 Malignant neoplasm of prostate: Secondary | ICD-10-CM

## 2020-05-29 ENCOUNTER — Other Ambulatory Visit: Payer: Managed Care, Other (non HMO)

## 2020-05-29 ENCOUNTER — Other Ambulatory Visit
Admission: RE | Admit: 2020-05-29 | Discharge: 2020-05-29 | Disposition: A | Discharge status: Home / Self Care | Payer: Managed Care, Other (non HMO) | Attending: Urology | Admitting: Urology

## 2020-05-29 ENCOUNTER — Telehealth: Payer: Self-pay | Admitting: *Deleted

## 2020-05-29 DIAGNOSIS — C61 Malignant neoplasm of prostate: Secondary | ICD-10-CM

## 2020-05-29 NOTE — Telephone Encounter (Signed)
Patient called to state he had his labs drawn at the hospital lab versus our lab.

## 2020-05-30 LAB — MISC LABCORP TEST (SEND OUT): Labcorp test code: 10322

## 2020-06-06 ENCOUNTER — Encounter: Payer: Self-pay | Admitting: Urology

## 2020-06-06 ENCOUNTER — Other Ambulatory Visit: Payer: Self-pay

## 2020-06-06 ENCOUNTER — Ambulatory Visit (INDEPENDENT_AMBULATORY_CARE_PROVIDER_SITE_OTHER): Payer: Managed Care, Other (non HMO) | Admitting: Urology

## 2020-06-06 VITALS — BP 128/74 | HR 68 | Ht 70.0 in | Wt 177.0 lb

## 2020-06-06 DIAGNOSIS — C61 Malignant neoplasm of prostate: Secondary | ICD-10-CM | POA: Diagnosis not present

## 2020-06-06 NOTE — Progress Notes (Signed)
06/06/2020 2:00 PM   Seth Byrd [birth year] [id number]  Referring provider: Delsa Grana, PA-C [address] [place],  [place] [address]  Chief Complaint  Patient presents with   Prostate Cancer    Urologichistory: 1.ClinicalT1cadenocarcinoma prostate, low risk -biopsy 11/2016;1/12 core Gleason 3+3 adenocarcinoma involving 3% at right apex. -PSA 4.8, volume 36 g -elected active surveillance.  -Confirmatory biopsy December 2018 1/12 core positive Gleason 3+3 right base involving 1%   HPI: 74 y.o. male presents for semiannual follow-up of prostate cancer.   No complaints  No bothersome LUTS  Denies dysuria, gross hematuria  Denies flank, abdominal or pelvic pain  PSA 05/29/2020 stable 4.7   PMH: Past Medical History:  Diagnosis Date   Arthritis    Cancer ([facility])    CHF (congestive heart failure) ([facility])    Hypertension    Myocardial infarction 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   Procedure: CORONARY ARTERY BYPASS GRAFTING (CABG), ON PUMP, TIMES THREE, USING LEFT INTERNAL MAMMARY ARTERY AND ENDOSCOPICALLY HARVESTED BILATERAL GREATER SAPHENOUS VEINS;  Surgeon: [name], MD;  Location: [facility];  Service: Open Heart Surgery;  Laterality: N/A;  LIMA to LAD SVG to Diag 1 SVG to [place] CATH AND CORONARY ANGIOGRAPHY N/A 02/09/2017   Procedure: Left Heart Cath and Coronary Angiography;  Surgeon: [name], MD;  Location: [facility];  Service: Cardiovascular;  Laterality: N/A;   TEE WITHOUT CARDIOVERSION N/A 02/10/2017   Procedure: TRANSESOPHAGEAL ECHOCARDIOGRAM (TEE);  Surgeon: [name], MD;  Location: [facility];  Service: Open Heart Surgery;  Laterality: N/A;   TOTAL KNEE ARTHROPLASTY Left  04/23/2015   Procedure: TOTAL KNEE ARTHROPLASTY;  Surgeon: Christophe Louis, MD;  Location: [facility];  Service: Orthopedics;  Laterality: Left;    Home Medications:  Allergies as of 06/06/2020   No Known Allergies     Medication List       Accurate as of June 06, 2020  2:00 PM. If you have any questions, ask your nurse or doctor.        acetaminophen 500 MG tablet Commonly known as: TYLENOL Take 500 mg by mouth every 6 (six) hours as needed.   aspirin EC 81 MG tablet Take 81 mg by mouth daily.   atorvastatin 40 MG tablet Commonly known as: LIPITOR Take 1 tablet (40 mg total) by mouth daily at 6 PM.   carvedilol 12.5 MG tablet Commonly known as: COREG Take 12.5 mg by mouth 2 (two) times daily with a meal.   CeraVe Crea Apply topically.   clindamycin 1 % external solution Commonly known as: CLEOCIN T   clobetasol 0.05 % external solution Commonly known as: TEMOVATE APPLY TO SCALP DAILY   Enbrel SureClick 50 MG/ML injection Generic drug: etanercept   Entresto 49-51 MG Generic drug: sacubitril-valsartan TK 1 T PO BID   Fish Oil 1200 MG Caps Take 1 capsule by mouth 2 (two) times daily.   furosemide 40 MG tablet Commonly known as: LASIX Take 1 tablet (40 mg total) by mouth daily.   Humira Pen 40 MG/0.8ML Pnkt Generic drug: Adalimumab   ketoconazole 2 % cream Commonly known as: NIZORAL   ONE-A-DAY MENS 50+ ADVANTAGE PO Take 1 tablet by mouth daily.  spironolactone 25 MG tablet Commonly known as: ALDACTONE Take 1 tablet (25 mg total) by mouth daily.   vitamin B-12 1000 MCG tablet Commonly known as: CYANOCOBALAMIN Take 1,000 mcg by mouth daily.       Allergies: No Known Allergies  Family History: Family History  Problem Relation Age of Onset   Hypertension Mother    Transient ischemic attack Mother    CVA Mother    Colon cancer Father    Hypertension Father    Prostate cancer Father    Diabetes Brother    Kidney cancer Neg Hx     Bladder Cancer Neg Hx     Social History:  reports that he quit smoking about 15 years ago. His smoking use included cigarettes. He has a 52.50 pack-year smoking history. He has never used smokeless tobacco. He reports that he does not drink alcohol and does not use drugs.   Physical Exam: BP 128/74    Pulse 68    Ht 5\' 10"  (1.778 m)    Wt 177 lb (80.3 kg)    BMI 25.40 kg/m   Constitutional:  Alert and oriented, No acute distress. HEENT: [place] AT, moist mucus membranes.  Trachea midline, no masses. Cardiovascular: No clubbing, cyanosis, or edema. Respiratory: Normal respiratory effort, no increased work of breathing. GU: Prostate 40 g, smooth without nodules Lymph: No cervical or inguinal lymphadenopathy. Skin: No rashes, bruises or suspicious lesions. Neurologic: Grossly intact, no focal deficits, moving all 4 extremities. Psychiatric: Normal mood and affect.   Assessment & Plan:    1.  T1c low risk prostate cancer  Stable PSA/DRE  Desires to continue active surveillance  Follow-up 6 months   Abbie Sons, MD  [facility] [address] [place], [place] [address] [phone]

## 2020-06-09 ENCOUNTER — Encounter: Payer: Self-pay | Admitting: Urology

## 2020-08-26 ENCOUNTER — Ambulatory Visit (INDEPENDENT_AMBULATORY_CARE_PROVIDER_SITE_OTHER): Payer: Managed Care, Other (non HMO) | Admitting: Family Medicine

## 2020-08-26 ENCOUNTER — Encounter: Payer: Self-pay | Admitting: Family Medicine

## 2020-08-26 ENCOUNTER — Other Ambulatory Visit: Payer: Self-pay

## 2020-08-26 VITALS — BP 124/86 | HR 77 | Temp 98.1°F | Resp 18 | Ht 70.0 in | Wt 178.0 lb

## 2020-08-26 DIAGNOSIS — E782 Mixed hyperlipidemia: Secondary | ICD-10-CM | POA: Diagnosis not present

## 2020-08-26 DIAGNOSIS — J431 Panlobular emphysema: Secondary | ICD-10-CM

## 2020-08-26 DIAGNOSIS — Z23 Encounter for immunization: Secondary | ICD-10-CM

## 2020-08-26 DIAGNOSIS — C61 Malignant neoplasm of prostate: Secondary | ICD-10-CM

## 2020-08-26 DIAGNOSIS — I5022 Chronic systolic (congestive) heart failure: Secondary | ICD-10-CM

## 2020-08-26 DIAGNOSIS — M069 Rheumatoid arthritis, unspecified: Secondary | ICD-10-CM

## 2020-08-26 DIAGNOSIS — I10 Essential (primary) hypertension: Secondary | ICD-10-CM

## 2020-08-26 DIAGNOSIS — Z951 Presence of aortocoronary bypass graft: Secondary | ICD-10-CM

## 2020-08-26 DIAGNOSIS — Z5181 Encounter for therapeutic drug level monitoring: Secondary | ICD-10-CM | POA: Diagnosis not present

## 2020-08-26 DIAGNOSIS — Z0000 Encounter for general adult medical examination without abnormal findings: Secondary | ICD-10-CM | POA: Diagnosis not present

## 2020-08-26 MED ORDER — ZOSTER VAC RECOMB ADJUVANTED 50 MCG/0.5ML IM SUSR: 0.5000 mL | Freq: Once | INTRAMUSCULAR | 1 refills | Status: AC

## 2020-08-26 NOTE — Patient Instructions (Signed)
Health Maintenance After Age 73 After age 73, you are at a higher risk for certain long-term diseases and infections as well as injuries from falls. Falls are a major cause of broken bones and head injuries in people who are older than age 73. Getting regular preventive care can help to keep you healthy and well. Preventive care includes getting regular testing and making lifestyle changes as recommended by your health care provider. Talk with your health care provider about:  Which screenings and tests you should have. A screening is a test that checks for a disease when you have no symptoms.  A diet and exercise plan that is right for you. What should I know about screenings and tests to prevent falls? Screening and testing are the best ways to find a health problem early. Early diagnosis and treatment give you the best chance of managing medical conditions that are common after age 73. Certain conditions and lifestyle choices may make you more likely to have a fall. Your health care provider may recommend:  Regular vision checks. Poor vision and conditions such as cataracts can make you more likely to have a fall. If you wear glasses, make sure to get your prescription updated if your vision changes.  Medicine review. Work with your health care provider to regularly review all of the medicines you are taking, including over-the-counter medicines. Ask your health care provider about any side effects that may make you more likely to have a fall. Tell your health care provider if any medicines that you take make you feel dizzy or sleepy.  Osteoporosis screening. Osteoporosis is a condition that causes the bones to get weaker. This can make the bones weak and cause them to break more easily.  Blood pressure screening. Blood pressure changes and medicines to control blood pressure can make you feel dizzy.  Strength and balance checks. Your health care provider may recommend certain tests to check your  strength and balance while standing, walking, or changing positions.  Foot health exam. Foot pain and numbness, as well as not wearing proper footwear, can make you more likely to have a fall.  Depression screening. You may be more likely to have a fall if you have a fear of falling, feel emotionally low, or feel unable to do activities that you used to do.  Alcohol use screening. Using too much alcohol can affect your balance and may make you more likely to have a fall. What actions can I take to lower my risk of falls? General instructions  Talk with your health care provider about your risks for falling. Tell your health care provider if: ? You fall. Be sure to tell your health care provider about all falls, even ones that seem minor. ? You feel dizzy, sleepy, or off-balance.  Take over-the-counter and prescription medicines only as told by your health care provider. These include any supplements.  Eat a healthy diet and maintain a healthy weight. A healthy diet includes low-fat dairy products, low-fat (lean) meats, and fiber from whole grains, beans, and lots of fruits and vegetables. Home safety  Remove any tripping hazards, such as rugs, cords, and clutter.  Install safety equipment such as grab bars in bathrooms and safety rails on stairs.  Keep rooms and walkways well-lit. Activity   Follow a regular exercise program to stay fit. This will help you maintain your balance. Ask your health care provider what types of exercise are appropriate for you.  If you need a cane or   walker, use it as recommended by your health care provider.  Wear supportive shoes that have nonskid soles. Lifestyle  Do not drink alcohol if your health care provider tells you not to drink.  If you drink alcohol, limit how much you have: ? 0-1 drink a day for women. ? 0-2 drinks a day for men.  Be aware of how much alcohol is in your drink. In the [place] one drink equals one typical bottle of beer (12  oz), one-half glass of wine (5 oz), or one shot of hard liquor (1 oz).  Do not use any products that contain nicotine or tobacco, such as cigarettes and e-cigarettes. If you need help quitting, ask your health care provider. Summary  Having a healthy lifestyle and getting preventive care can help to protect your health and wellness after age 73.  Screening and testing are the best way to find a health problem early and help you avoid having a fall. Early diagnosis and treatment give you the best chance for managing medical conditions that are more common for people who are older than age 73.  Falls are a major cause of broken bones and head injuries in people who are older than age 73. Take precautions to prevent a fall at home.  Work with your health care provider to learn what changes you can make to improve your health and wellness and to prevent falls. This information is not intended to replace advice given to you by your health care provider. Make sure you discuss any questions you have with your health care provider. Document Revised: 02/16/2019 Document Reviewed: 09/08/2017 Elsevier Patient Education  2020 [employer]  

## 2020-08-26 NOTE — Progress Notes (Signed)
Patient: Seth Byrd, Male    DOB: 1946-11-09, 74 y.o.   MRN: [id number] Delsa Grana, PA-C Visit Date: 08/28/2020  Today's Provider: Delsa Grana, PA-C   Chief Complaint  Patient presents with  . Annual Exam   Subjective:   Annual physical exam:  Seth Byrd is a 74 y.o. male who presents today for health maintenance and annual & complete physical exam.   He feels well.  He reports exercising - working on staying active, no exertional sx or concerns Diet - healthy, no salt at all, he "watches what he eats" prepares food mostly at home He reports he is sleeping well.  Hx of MI, Heart failure, cardiomyopathy, CAD- per Dr. Ubaldo Glassing, on lipitor, lasix, spironolactone, coreg, entresto - compliant with f/up, most recent OV reviewed HTN- well controlled, BP at goal today, pt denies any cardiac sx or changes Prostate CA - per Dr. Bernardo Heater - T1cadenocarcinoma prostate- monitoring with PSA Does annual lung CA screening - no respiratory sx, per imaging dx of emphysema RA- on humira - managed by Dr. Phillip Heal   USPSTF grade A and B recommendations - reviewed and addressed today  Depression:  Phq 9 completed today by patient, was reviewed by me with patient in the room, score is  negative, pt feels good PHQ 2/9 Scores 08/26/2020 08/25/2019 08/19/2018 08/19/2018  PHQ - 2 Score 0 0 0 0  PHQ- 9 Score - 0 0 -   Depression screen [facility] 2/9 08/26/2020 08/25/2019 08/19/2018 08/19/2018 06/22/2018  Decreased Interest 0 0 0 0 0  Down, Depressed, Hopeless 0 0 0 0 0  PHQ - 2 Score 0 0 0 0 0  Altered sleeping - 0 0 - -  Tired, decreased energy - 0 0 - -  Change in appetite - 0 0 - -  Feeling bad or failure about yourself  - 0 0 - -  Trouble concentrating - 0 0 - -  Moving slowly or fidgety/restless - 0 0 - -  Suicidal thoughts - 0 0 - -  PHQ-9 Score - 0 0 - -  Difficult doing work/chores - Not difficult at all Not difficult at all - -   Health Maintenance  Topic Date Due  . Fecal DNA  (Cologuard)  10/04/2020  . TETANUS/TDAP  09/25/2027  . INFLUENZA VACCINE  Completed  . COVID-19 Vaccine  Completed  . Hepatitis C Screening  Completed  . PNA vac Low Risk Adult  Completed     Hep C Screening:  done STD testing and prevention (HIV/chl/gon/syphilis): declined Intimate partner violence:denies  Prostate cancer: managing by urology Urinary Symptoms: per urology  Advanced Care Planning:  A voluntary discussion about advance care planning including the explanation and discussion of advance directives.     Skin cancer:  Pt reports no hx of skin cancer, suspicious lesions/biopsies in the past.  Sees Dr. Phillip Heal sees 2x a year  Colorectal cancer cologuard will be due in Dec (has been doing cologuard though there is a hx of colon ca in family - father?) Pt denies melena, hematochezia   Lung cancer: Doing annually - last one will be this year  Low Dose CT Chest recommended if Age 86-80 years, 20 pack-year currently smoking OR have quit w/in 15years. Patient does qualify.   Social History   Tobacco Use  . Smoking status: Former Smoker    Packs/day: 1.50    Years: 35.00    Pack years: 52.50    Types: Cigarettes    Quit date:  04/09/2005    Years since quitting: 15.3  . Smokeless tobacco: Never Used  Substance Use Topics  . Alcohol use: No    Alcohol screening:   [facility] Visit from 08/26/2020 in [facility]  AUDIT-C Score 0     Blood pressure/Hypertension: BP Readings from Last 3 Encounters:  08/26/20 124/86  06/06/20 128/74  11/30/19 132/72   Weight/Obesity: Wt Readings from Last 3 Encounters:  08/26/20 178 lb (80.7 kg)  06/06/20 177 lb (80.3 kg)  11/30/19 178 lb (80.7 kg)   BMI Readings from Last 3 Encounters:  08/26/20 25.54 kg/m  06/06/20 25.40 kg/m  11/30/19 25.54 kg/m    Lipids:  Lab Results  Component Value Date   CHOL 106 08/25/2019   CHOL CANCELED 06/22/2018   CHOL 111 06/22/2018   Lab Results  Component Value Date    HDL 35 (L) 08/25/2019   HDL CANCELED 06/22/2018   HDL 36 (L) 06/22/2018   Lab Results  Component Value Date   LDLCALC 56 08/25/2019   LDLCALC 60 06/22/2018   Lab Results  Component Value Date   TRIG 73 08/25/2019   TRIG CANCELED 06/22/2018   TRIG 75 06/22/2018   Lab Results  Component Value Date   CHOLHDL 3.0 08/25/2019   CHOLHDL 3.1 06/22/2018    Glucose:  Glucose  Date Value Ref Range Status  08/26/2020 86 65 - 99 mg/dL Final  08/25/2019 91 65 - 99 mg/dL Final  06/22/2018 103 (H) 65 - 99 mg/dL Final   Glucose, Bld  Date Value Ref Range Status  07/17/2018 141 (H) 70 - 99 mg/dL Final  05/03/2017 108 (H) 65 - 99 mg/dL Final  02/25/2017 90 65 - 99 mg/dL Final   Glucose-Capillary  Date Value Ref Range Status  07/17/2018 132 (H) 70 - 99 mg/dL Final  02/17/2017 150 (H) 65 - 99 mg/dL Final  02/17/2017 90 65 - 99 mg/dL Final    Social History      He  reports that he quit smoking about 15 years ago. His smoking use included cigarettes. He has a 52.50 pack-year smoking history. He has never used smokeless tobacco. He reports that he does not drink alcohol and does not use drugs.       Social History   Socioeconomic History  . Marital status: Divorced    Spouse name: Not on file  . Number of children: 2  . Years of education: 9  . Highest education level: Associate degree: academic program  Occupational History  . Not on file  Tobacco Use  . Smoking status: Former Smoker    Packs/day: 1.50    Years: 35.00    Pack years: 52.50    Types: Cigarettes    Quit date: 04/09/2005    Years since quitting: 15.3  . Smokeless tobacco: Never Used  Vaping Use  . Vaping Use: Never used  Substance and Sexual Activity  . Alcohol use: No  . Drug use: No  . Sexual activity: Yes    Birth control/protection: None  Other Topics Concern  . Not on file  Social History Narrative  . Not on file   Social Determinants of Health   Financial Resource Strain: Low Risk   .  Difficulty of Paying Living Expenses: Not hard at all  Food Insecurity: No Food Insecurity  . Worried About Charity fundraiser in the Last Year: Never true  . Ran Out of Food in the Last Year: Never true  Transportation Needs: No  Transportation Needs  . Lack of Transportation (Medical): No  . Lack of Transportation (Non-Medical): No  Physical Activity:   . Days of Exercise per Week: Not on file  . Minutes of Exercise per Session: Not on file  Stress: No Stress Concern Present  . Feeling of Stress : Only a little  Social Connections: Moderately Isolated  . Frequency of Communication with Friends and Family: Once a week  . Frequency of Social Gatherings with Friends and Family: Once a week  . Attends Religious Services: 1 to 4 times per year  . Active Member of Clubs or Organizations: Yes  . Attends Archivist Meetings: 1 to 4 times per year  . Marital Status: Divorced     Family History        Family Status  Relation Name Status  . Mother  Deceased  . Father  Deceased  . Brother oldest Deceased  . Brother youngest Deceased       old age  . Neg Hx  (Not Specified)        His family history includes CVA in his mother; Colon cancer in his father; Diabetes in his brother; Hypertension in his father and mother; Prostate cancer in his father; Transient ischemic attack in his mother. There is no history of Kidney cancer or Bladder Cancer.       Family History  Problem Relation Age of Onset  . Hypertension Mother   . Transient ischemic attack Mother   . CVA Mother   . Colon cancer Father   . Hypertension Father   . Prostate cancer Father   . Diabetes Brother   . Kidney cancer Neg Hx   . Bladder Cancer Neg Hx     Patient Active Problem List   Diagnosis Date Noted  . Preventative health care 08/19/2018  . Prostate cancer ([place]) 10/19/2017  . Chronic systolic CHF (congestive heart failure) ([place]) 02/25/2017  . Ischemic cardiomyopathy 02/25/2017  . CAD (coronary  artery disease) 02/25/2017  . Rheumatoid arthritis involving multiple sites ([place]) 02/23/2017  . 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 Chest pain, rule out acute myocardial infarction 02/09/2017  . History of heart attack 02/09/2017  . Essential hypertension 11/25/2015  . Hyperlipidemia 11/25/2015  . Panlobular emphysema ([place]) 11/25/2015  . 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 Primary osteoarthritis involving multiple joints 11/25/2015  . Primary osteoarthritis of knee 04/23/2015    Past Surgical History:  Procedure Laterality Date  . BACK SURGERY     Spinal Fusion with 2 screws  . CORONARY ARTERY BYPASS GRAFT N/A 02/10/2017   Procedure: CORONARY ARTERY BYPASS GRAFTING (CABG), ON PUMP, TIMES THREE, USING LEFT INTERNAL MAMMARY ARTERY AND ENDOSCOPICALLY HARVESTED BILATERAL GREATER SAPHENOUS VEINS;  Surgeon: [name], MD;  Location: [place];  Service: Open Heart Surgery;  Laterality: N/A;  LIMA to LAD SVG to Diag 1 SVG to OM1  . JOINT REPLACEMENT    . LEFT HEART CATH AND CORONARY ANGIOGRAPHY N/A 02/09/2017   Procedure: Left Heart Cath and Coronary Angiography;  Surgeon: [name], MD;  Location: [place] [facility];  Service: Cardiovascular;  Laterality: N/A;  . TEE WITHOUT CARDIOVERSION N/A 02/10/2017   Procedure: TRANSESOPHAGEAL ECHOCARDIOGRAM (TEE);  Surgeon: [name], MD;  Location: [place];  Service: Open Heart Surgery;  Laterality: N/A;  . TOTAL KNEE ARTHROPLASTY Left 04/23/2015   Procedure: TOTAL KNEE ARTHROPLASTY;  Surgeon: Christophe Louis, MD;  Location: [facility];  Service: Orthopedics;  Laterality: Left;  Current Outpatient Medications:  .  acetaminophen (TYLENOL) 500 MG tablet, Take 500 mg by mouth every 6 (six) hours as needed., Disp: , Rfl:  .  aspirin EC 81 MG tablet, Take 81 mg by mouth daily., Disp: , Rfl:  .  atorvastatin (LIPITOR) 40 MG tablet, Take 1 tablet (40 mg total) by mouth daily at 6 PM., Disp: , Rfl:  .  carvedilol (COREG) 12.5 MG  tablet, Take 12.5 mg by mouth 2 (two) times daily with a meal., Disp: , Rfl: 3 .  clindamycin (CLEOCIN T) 1 % external solution, , Disp: , Rfl:  .  clobetasol (TEMOVATE) 0.05 % external solution, APPLY TO SCALP DAILY, Disp: , Rfl: 5 .  Emollient (CERAVE) CREA, Apply topically., Disp: , Rfl:  .  ENBREL SURECLICK 50 MG/ML injection, , Disp: , Rfl:  .  ENTRESTO 49-51 MG, TK 1 T PO BID, Disp: , Rfl:  .  furosemide (LASIX) 40 MG tablet, Take 1 tablet (40 mg total) by mouth daily., Disp: 30 tablet, Rfl:  .  HUMIRA PEN 40 MG/0.8ML PNKT, , Disp: , Rfl:  .  ketoconazole (NIZORAL) 2 % cream, , Disp: , Rfl:  .  Multiple Vitamins-Minerals (ONE-A-DAY MENS 50+ ADVANTAGE PO), Take 1 tablet by mouth daily., Disp: , Rfl:  .  Omega-3 Fatty Acids (FISH OIL) 1200 MG CAPS, Take 1 capsule by mouth 2 (two) times daily. , Disp: , Rfl:  .  spironolactone (ALDACTONE) 25 MG tablet, Take 1 tablet (25 mg total) by mouth daily., Disp: , Rfl:  .  vitamin B-12 (CYANOCOBALAMIN) 1000 MCG tablet, Take 1,000 mcg by mouth daily. , Disp: , Rfl:   No Known Allergies  Patient Care Team: Delsa Grana, PA-C as PCP - General (Family Medicine) Ubaldo Glassing Javier Docker, MD as Consulting Physician (Cardiology) Bensimhon, Shaune Pascal, MD as Consulting Physician (Cardiology) Abbie Sons, MD (Urology)   Chart Review: I personally reviewed active problem list, medication list, allergies, family history, social history, health maintenance, notes from last encounter, lab results, imaging with the patient/caregiver today.   Review of Systems  Constitutional: Negative.   HENT: Negative.   Eyes: Negative.   Respiratory: Negative.   Cardiovascular: Negative.   Gastrointestinal: Negative.   Endocrine: Negative.   Genitourinary: Negative.   Musculoskeletal: Negative.   Skin: Negative.   Allergic/Immunologic: Negative.   Neurological: Negative.   Hematological: Negative.   Psychiatric/Behavioral: Negative.   All other systems reviewed and  are negative.         Objective:   Vitals:  Vitals:   08/26/20 0819  BP: 124/86  Pulse: 77  Resp: 18  Temp: 98.1 F (36.7 C)  TempSrc: Oral  SpO2: 97%  Weight: 178 lb (80.7 kg)  Height: 5\' 10"  (1.778 m)    Body mass index is 25.54 kg/m.  Physical Exam Vitals and nursing note reviewed.  Constitutional:      General: He is not in acute distress.    Appearance: Normal appearance. He is well-developed. He is not ill-appearing, toxic-appearing or diaphoretic.     Interventions: Face mask in place.  HENT:     Head: Normocephalic and atraumatic.     Jaw: No trismus.     Right Ear: External ear normal.     Left Ear: External ear normal.  Eyes:     General: Lids are normal. No scleral icterus.       Right eye: No discharge.        Left eye: No discharge.  Conjunctiva/sclera: Conjunctivae normal.  Neck:     Trachea: Trachea and phonation normal. No tracheal deviation.  Cardiovascular:     Rate and Rhythm: Normal rate and regular rhythm.     Pulses: Normal pulses.          Radial pulses are 2+ on the right side and 2+ on the left side.       Posterior tibial pulses are 2+ on the right side and 2+ on the left side.     Heart sounds: Normal heart sounds. No murmur heard.  No friction rub. No gallop.   Pulmonary:     Effort: Pulmonary effort is normal. No respiratory distress.     Breath sounds: Normal breath sounds. No stridor. No wheezing, rhonchi or rales.  Abdominal:     General: Bowel sounds are normal. There is no distension.     Palpations: Abdomen is soft.  Musculoskeletal:     Right lower leg: No edema.     Left lower leg: No edema.  Skin:    General: Skin is warm and dry.     Coloration: Skin is not jaundiced.     Findings: No rash.     Nails: There is no clubbing.  Neurological:     Mental Status: He is alert. Mental status is at baseline.     Cranial Nerves: No dysarthria or facial asymmetry.     Motor: No tremor or abnormal muscle tone.     Gait:  Gait normal.  Psychiatric:        Mood and Affect: Mood normal.        Speech: Speech normal.        Behavior: Behavior normal. Behavior is cooperative.         Fall Risk: Fall Risk  08/26/2020 08/25/2019 08/19/2018 06/22/2018 01/20/2018  Falls in the past year? 0 0 No No No  Number falls in past yr: 0 0 - - -  Injury with Fall? 0 0 - - -  Follow up Falls evaluation completed - - - -    Functional Status Survey: Is the patient deaf or have difficulty hearing?: No Does the patient have difficulty seeing, even when wearing glasses/contacts?: No Does the patient have difficulty concentrating, remembering, or making decisions?: No Does the patient have difficulty walking or climbing stairs?: No Does the patient have difficulty dressing or bathing?: No Does the patient have difficulty doing errands alone such as visiting a doctor's office or shopping?: No   Assessment & Plan:    CPE completed today   . PSA- per urology  . USPSTF grade A and B recommendations reviewed with patient; age-appropriate recommendations, preventive care, screening tests, etc discussed and encouraged; healthy living encouraged; see AVS for patient education given to patient  . Discussed importance of 150 minutes of physical activity weekly, AHA exercise recommendations given to pt in AVS/handout  . Discussed importance of healthy diet:  eating lean meats and proteins, avoiding trans fats and saturated fats, avoid simple sugars and excessive carbs in diet, eat 6 servings of fruit/vegetables daily and drink plenty of water and avoid sweet beverages.  DASH diet reviewed if pt has HTN  . Recommended pt to do annual eye exam and routine dental exams/cleanings  . Reviewed Health Maintenance: Health Maintenance  Topic Date Due  . Fecal DNA (Cologuard)  10/04/2020  . TETANUS/TDAP  09/25/2027  . INFLUENZA VACCINE  Completed  . COVID-19 Vaccine  Completed  . Hepatitis C Screening  Completed  .  PNA vac Low  Risk Adult  Completed    . Immunizations: Immunization History  Administered Date(s) Administered  . Influenza, High Dose Seasonal PF 07/31/2015, 08/07/2016, 08/13/2017, 08/19/2018, 07/29/2019, 07/29/2019, 08/15/2020  . Influenza,inj,quad, With Preservative 07/31/2016  . Moderna SARS-COVID-2 Vaccination 12/31/2019, 01/20/2020  . Pneumococcal Conjugate-13 07/31/2015  . Pneumococcal-Unspecified 07/31/2016  . Tdap 09/24/2017     ICD-10-CM   1. Adult general medical exam  Z00.00 CBC with Differential/Platelet    Lipid panel    Comprehensive metabolic panel  2. Encounter for medication monitoring  Z51.81 CBC with Differential/Platelet    Lipid panel    Comprehensive metabolic panel  3. Need for shingles vaccine  Z23 Zoster Vaccine Adjuvanted [facility]) injection   Discuss immunization, prescription for the 2 shot series was sent to his pharmacy  4. Mixed hyperlipidemia  E78.2 Lipid panel    Comprehensive metabolic panel   Compliant with statin, no myalgias or concerns, due for labs, does follow with cardiology  5. Panlobular emphysema ([place])  J43.1    On low-dose CT scans for lung cancer screening, patient denies any symptoms  6. Prostate cancer ([place])  C61    Stable, monitoring with urology, Dr. Bernardo Heater  7. S/P CABG x 3  Z95.1 Lipid panel    Comprehensive metabolic panel   Per cardiology at [facility]  8. Chronic systolic CHF (congestive heart failure) (HCC)  I50.22 Comprehensive metabolic panel   No signs of fluid overload, per Dr. Ubaldo Glassing  9. Rheumatoid arthritis involving multiple sites, unspecified whether rheumatoid factor present (H[facility])  M06.9 CBC with Differential/Platelet    Comprehensive metabolic panel   On Humira injections with Dr. Phillip Heal  10. Essential hypertension  I10 Comprehensive metabolic panel   Stable, well-controlled       Delsa Grana, PA-C 08/28/20 1:42 PM  [facility]

## 2020-08-27 LAB — COMPREHENSIVE METABOLIC PANEL
ALT: 19 IU/L (ref 0–44)
AST: 19 IU/L (ref 0–40)
Albumin/Globulin Ratio: 1.5 (ref 1.2–2.2)
Albumin: 4.5 g/dL (ref 3.7–4.7)
Alkaline Phosphatase: 55 IU/L (ref 44–121)
BUN/Creatinine Ratio: 14 (ref 10–24)
BUN: 13 mg/dL (ref 8–27)
Bilirubin Total: 1.1 mg/dL (ref 0.0–1.2)
CO2: 25 mmol/L (ref 20–29)
Calcium: 9.9 mg/dL (ref 8.6–10.2)
Chloride: 105 mmol/L (ref 96–106)
Creatinine, Ser: 0.9 mg/dL (ref 0.76–1.27)
GFR calc Af Amer: 98 mL/min/{1.73_m2} (ref >59)
GFR calc non Af Amer: 84 mL/min/{1.73_m2} (ref >59)
Globulin, Total: 3.1 g/dL (ref 1.5–4.5)
Glucose: 86 mg/dL (ref 65–99)
Potassium: 4.4 mmol/L (ref 3.5–5.2)
Sodium: 144 mmol/L (ref 134–144)
Total Protein: 7.6 g/dL (ref 6.0–8.5)

## 2020-08-27 LAB — CBC WITH DIFFERENTIAL/PLATELET
Basophils Absolute: 0 10*3/uL (ref 0.0–0.2)
Basos: 1 %
EOS (ABSOLUTE): 0.1 10*3/uL (ref 0.0–0.4)
Eos: 3 %
Hematocrit: 40.7 % (ref 37.5–51.0)
Hemoglobin: 14.3 g/dL (ref 13.0–17.7)
Immature Grans (Abs): 0 10*3/uL (ref 0.0–0.1)
Immature Granulocytes: 0 %
Lymphocytes Absolute: 2 10*3/uL (ref 0.7–3.1)
Lymphs: 47 %
MCH: 33.5 pg — ABNORMAL HIGH (ref 26.6–33.0)
MCHC: 35.1 g/dL (ref 31.5–35.7)
MCV: 95 fL (ref 79–97)
Monocytes Absolute: 0.4 10*3/uL (ref 0.1–0.9)
Monocytes: 10 %
Neutrophils Absolute: 1.7 10*3/uL (ref 1.4–7.0)
Neutrophils: 39 %
Platelets: 168 10*3/uL (ref 150–450)
RBC: 4.27 x10E6/uL (ref 4.14–5.80)
RDW: 11 % — ABNORMAL LOW (ref 11.6–15.4)
WBC: 4.2 10*3/uL (ref 3.4–10.8)

## 2020-08-27 LAB — LIPID PANEL
Chol/HDL Ratio: 3.4 ratio (ref 0.0–5.0)
Cholesterol, Total: 122 mg/dL (ref 100–199)
HDL: 36 mg/dL — ABNORMAL LOW (ref >39)
LDL Chol Calc (NIH): 67 mg/dL (ref 0–99)
Triglycerides: 104 mg/dL (ref 0–149)
VLDL Cholesterol Cal: 19 mg/dL (ref 5–40)

## 2020-09-18 ENCOUNTER — Other Ambulatory Visit: Payer: Self-pay | Admitting: *Deleted

## 2020-09-18 DIAGNOSIS — Z122 Encounter for screening for malignant neoplasm of respiratory organs: Secondary | ICD-10-CM

## 2020-09-18 DIAGNOSIS — Z87891 Personal history of nicotine dependence: Secondary | ICD-10-CM

## 2020-09-18 NOTE — Progress Notes (Signed)
Contacted to schedule lung screening scan. Former smoker, quit 15 years ago, 52.5 pack year history.

## 2020-09-19 ENCOUNTER — Encounter: Payer: Self-pay | Admitting: *Deleted

## 2020-10-02 ENCOUNTER — Ambulatory Visit
Admission: RE | Admit: 2020-10-02 | Discharge: 2020-10-02 | Disposition: A | Discharge status: Home / Self Care | Payer: Managed Care, Other (non HMO) | Source: Ambulatory Visit | Attending: Nurse Practitioner | Admitting: Nurse Practitioner

## 2020-10-02 ENCOUNTER — Encounter: Payer: Self-pay | Admitting: Family Medicine

## 2020-10-02 ENCOUNTER — Other Ambulatory Visit: Payer: Self-pay

## 2020-10-02 DIAGNOSIS — Z87891 Personal history of nicotine dependence: Secondary | ICD-10-CM | POA: Diagnosis present

## 2020-10-02 DIAGNOSIS — I700 Atherosclerosis of aorta: Secondary | ICD-10-CM | POA: Insufficient documentation

## 2020-10-02 DIAGNOSIS — Z122 Encounter for screening for malignant neoplasm of respiratory organs: Secondary | ICD-10-CM | POA: Diagnosis present

## 2020-10-09 ENCOUNTER — Encounter: Payer: Self-pay | Admitting: *Deleted

## 2020-11-29 ENCOUNTER — Other Ambulatory Visit: Payer: Self-pay

## 2020-12-03 ENCOUNTER — Other Ambulatory Visit: Payer: Self-pay | Admitting: Family Medicine

## 2020-12-03 DIAGNOSIS — C61 Malignant neoplasm of prostate: Secondary | ICD-10-CM

## 2020-12-04 ENCOUNTER — Other Ambulatory Visit: Payer: Managed Care, Other (non HMO)

## 2020-12-04 ENCOUNTER — Other Ambulatory Visit: Payer: Self-pay

## 2020-12-04 DIAGNOSIS — C61 Malignant neoplasm of prostate: Secondary | ICD-10-CM

## 2020-12-05 ENCOUNTER — Ambulatory Visit: Payer: Self-pay | Admitting: Urology

## 2020-12-05 LAB — PSA: Prostate Specific Ag, Serum: 4.4 ng/mL — ABNORMAL HIGH (ref 0.0–4.0)

## 2020-12-09 ENCOUNTER — Ambulatory Visit: Payer: Self-pay | Admitting: Urology

## 2020-12-18 ENCOUNTER — Encounter: Payer: Self-pay | Admitting: Urology

## 2020-12-18 ENCOUNTER — Ambulatory Visit (INDEPENDENT_AMBULATORY_CARE_PROVIDER_SITE_OTHER): Payer: Managed Care, Other (non HMO) | Admitting: Urology

## 2020-12-18 ENCOUNTER — Other Ambulatory Visit: Payer: Self-pay

## 2020-12-18 VITALS — BP 107/62 | HR 71 | Ht 70.5 in | Wt 179.0 lb

## 2020-12-18 DIAGNOSIS — C61 Malignant neoplasm of prostate: Secondary | ICD-10-CM | POA: Diagnosis not present

## 2020-12-18 NOTE — Progress Notes (Signed)
12/18/2020 1:04 PM   Seth Byrd 09-Aug-1946 [id number]  Referring provider: Delsa Grana, PA-C [address] [place],  [place] [address]  Chief Complaint  Patient presents with  . Prostate Cancer    Urologichistory: 1.ClinicalT1cadenocarcinoma prostate, low risk -biopsy1/2018;1/12 core Gleason 3+3 adenocarcinoma involving 3% at right apex. -PSA 4.8, volume 36 g -elected active surveillance.  -Confirmatory biopsy December 2018 1/12 core positive Gleason 3+3 right base involving 1%  HPI: 75 y.o. male presents for semiannual follow-up for low risk prostate cancer surveillance.   No problems since his last visit  No bothersome LUTS  Denies dysuria, gross hematuria  Denies flank, abdominal or pelvic pain  PSA 12/04/2020 stable at 4.4     PMH: Past Medical History:  Diagnosis Date  . Arthritis   . Cancer ([place])   . CHF (congestive heart failure) ([place])   . Hypertension   . Myocardial infarction ([place])   . Prostate cancer ([place]) 10/19/2017    Surgical History: Past Surgical History:  Procedure Laterality Date  . BACK SURGERY     Spinal Fusion with 2 screws  . CORONARY ARTERY BYPASS GRAFT N/A 02/10/2017   Procedure: CORONARY ARTERY BYPASS GRAFTING (CABG), ON PUMP, TIMES THREE, USING LEFT INTERNAL MAMMARY ARTERY AND ENDOSCOPICALLY HARVESTED BILATERAL GREATER SAPHENOUS VEINS;  Surgeon: [name], MD;  Location: [place];  Service: Open Heart Surgery;  Laterality: N/A;  LIMA to LAD SVG to Diag 1 SVG to OM1  . JOINT REPLACEMENT    . LEFT HEART CATH AND CORONARY ANGIOGRAPHY N/A 02/09/2017   Procedure: Left Heart Cath and Coronary Angiography;  Surgeon: [name], MD;  Location: [place] [facility];  Service: Cardiovascular;  Laterality: N/A;  . TEE WITHOUT CARDIOVERSION N/A 02/10/2017   Procedure: TRANSESOPHAGEAL ECHOCARDIOGRAM (TEE);  Surgeon: [name], MD;  Location: [place];  Service: Open Heart Surgery;   Laterality: N/A;  . TOTAL KNEE ARTHROPLASTY Left 04/23/2015   Procedure: TOTAL KNEE ARTHROPLASTY;  Surgeon: Christophe Louis, MD;  Location: [facility];  Service: Orthopedics;  Laterality: Left;    Home Medications:  Allergies as of 12/18/2020   No Known Allergies     Medication List       Accurate as of December 18, 2020  1:04 PM. If you have any questions, ask your nurse or doctor.        acetaminophen 500 MG tablet Commonly known as: TYLENOL Take 500 mg by mouth every 6 (six) hours as needed.   aspirin EC 81 MG tablet Take 81 mg by mouth daily.   atorvastatin 40 MG tablet Commonly known as: LIPITOR Take 1 tablet (40 mg total) by mouth daily at 6 PM.   carvedilol 12.5 MG tablet Commonly known as: COREG Take 12.5 mg by mouth 2 (two) times daily with a meal.   CeraVe Crea Apply topically.   clindamycin 1 % external solution Commonly known as: CLEOCIN T   clobetasol 0.05 % external solution Commonly known as: TEMOVATE APPLY TO SCALP DAILY   Enbrel SureClick 50 MG/ML injection Generic drug: etanercept   Entresto 49-51 MG Generic drug: sacubitril-valsartan TK 1 T PO BID   Fish Oil 1200 MG Caps Take 1 capsule by mouth 2 (two) times daily.   furosemide 40 MG tablet Commonly known as: LASIX Take 1 tablet (40 mg total) by mouth daily.   Humira Pen 40 MG/0.8ML Pnkt Generic drug: Adalimumab   ketoconazole 2 % cream Commonly known as: NIZORAL   ONE-A-DAY MENS 50+  ADVANTAGE PO Take 1 tablet by mouth daily.   spironolactone 25 MG tablet Commonly known as: ALDACTONE Take 1 tablet (25 mg total) by mouth daily.   vitamin B-12 1000 MCG tablet Commonly known as: CYANOCOBALAMIN Take 1,000 mcg by mouth daily.       Allergies: No Known Allergies  Family History: Family History  Problem Relation Age of Onset  . Hypertension Mother   . Transient ischemic attack Mother   . CVA Mother   . Colon cancer Father   . Hypertension Father   . Prostate cancer Father    . Diabetes Brother   . Kidney cancer Neg Hx   . Bladder Cancer Neg Hx     Social History:  reports that he quit smoking about 15 years ago. His smoking use included cigarettes. He has a 52.50 pack-year smoking history. He has never used smokeless tobacco. He reports that he does not drink alcohol and does not use drugs.   Physical Exam: BP 107/62   Pulse 71   Ht 5' 10.5" (1.791 m)   Wt 179 lb (81.2 kg)   BMI 25.32 kg/m   Constitutional:  Alert and oriented, No acute distress. HEENT: [place] AT, moist mucus membranes.  Trachea midline, no masses. Cardiovascular: No clubbing, cyanosis, or edema. Respiratory: Normal respiratory effort, no increased work of breathing. GU: Prostate 40 g, smooth without nodules Neurologic: Grossly intact, no focal deficits, moving all 4 extremities. Psychiatric: Normal mood and affect.   Assessment & Plan:    1.  T1c low risk prostate cancer  Stable PSA, benign DRE  Desires to continue active surveillance  Follow-up 6 months for PSA/DRE   Abbie Sons, MD  [facility] [address] [place], [place] [address] [phone]

## 2021-04-14 ENCOUNTER — Other Ambulatory Visit: Payer: Self-pay | Admitting: *Deleted

## 2021-04-14 DIAGNOSIS — C61 Malignant neoplasm of prostate: Secondary | ICD-10-CM

## 2021-06-05 ENCOUNTER — Encounter: Payer: Self-pay | Admitting: Unknown Physician Specialty

## 2021-06-05 ENCOUNTER — Telehealth (INDEPENDENT_AMBULATORY_CARE_PROVIDER_SITE_OTHER): Payer: Managed Care, Other (non HMO) | Admitting: Unknown Physician Specialty

## 2021-06-05 ENCOUNTER — Other Ambulatory Visit: Payer: Self-pay

## 2021-06-05 DIAGNOSIS — Z20822 Contact with and (suspected) exposure to covid-19: Secondary | ICD-10-CM | POA: Diagnosis not present

## 2021-06-05 DIAGNOSIS — R059 Cough, unspecified: Secondary | ICD-10-CM

## 2021-06-05 NOTE — Progress Notes (Signed)
   There were no vitals taken for this visit.   Subjective:    Patient ID: Seth Byrd, male    DOB: 02-07-1946, 75 y.o.   MRN: [id number]  HPI: Seth Byrd is a 75 y.o. male  Chief Complaint  Patient presents with   Covid Exposure   Cough   This visit was completed via telephone due to the restrictions of the COVID-19 pandemic. All issues as above were discussed and addressed but no physical exam was performed. If it was felt that the patient should be evaluated in the office, they were directed there. The patient verbally consented to this visit. Patient was unable to complete an audio/visual visit due to Technical difficulties. Location of the patient: work Location of the provider: work TTime spent on call: 20 minutes I verified patient identity using two factors (patient name and date of birth). Patient consents verbally to being seen via telemedicine visit today.   Cough This is a new problem. Episode onset: sx onset 6 days ago. Progression since onset: Feels normal today. The cough is Non-productive. Pertinent negatives include no chest pain, chills, ear congestion, ear pain, fever, headaches, heartburn, hemoptysis, myalgias, nasal congestion, postnasal drip, rash, rhinorrhea, sore throat, shortness of breath, sweats, weight loss or wheezing. Nothing aggravates the symptoms. Risk factors for lung disease include occupational exposure. He has tried nothing for the symptoms.    Relevant past medical, surgical, family and social history reviewed and updated as indicated. Interim medical history since our last visit reviewed. Allergies and medications reviewed and updated.  Review of Systems  Constitutional:  Negative for chills, fever and weight loss.  HENT:  Negative for ear pain, postnasal drip, rhinorrhea and sore throat.   Respiratory:  Positive for cough. Negative for hemoptysis, shortness of breath and wheezing.   Cardiovascular:  Negative for chest pain.   Gastrointestinal:  Negative for heartburn.  Musculoskeletal:  Negative for myalgias.  Skin:  Negative for rash.  Neurological:  Negative for headaches.   Per HPI unless specifically indicated above     Objective:    There were no vitals taken for this visit.  Wt Readings from Last 3 Encounters:  12/18/20 179 lb (81.2 kg)  10/02/20 178 lb (80.7 kg)  08/26/20 178 lb (80.7 kg)    Physical Exam Neurological:     Mental Status: He is alert and oriented to person, place, and time.  Psychiatric:        Mood and Affect: Mood normal.    Results for orders placed or performed in visit on 12/04/20  PSA  Result Value Ref Range   Prostate Specific Ag, Serum 4.4 (H) 0.0 - 4.0 ng/mL      Assessment & Plan:   Problem List Items Addressed This Visit   None Visit Diagnoses     Close exposure to COVID-19 virus    -  Primary   Cough           Pt reports cough is resolved but wants to get a PCR.  Sx onset 6 days ago and outside of Covid treatment benefit window and due to [facility] guidelines, with resolved symptoms, quarantine not needed.    He will come in to get a PCR test.  Discussed with pt that at this time, since asymptomatic, a positive test would not change treatment.    Follow up plan: Return if symptoms worsen or fail to improve.

## 2021-06-06 LAB — SPECIMEN STATUS REPORT

## 2021-06-06 LAB — SARS-COV-2, NAA 2 DAY TAT

## 2021-06-06 LAB — NOVEL CORONAVIRUS, NAA: SARS-CoV-2, NAA: NOT DETECTED

## 2021-06-09 ENCOUNTER — Telehealth: Payer: Self-pay | Admitting: *Deleted

## 2021-06-09 NOTE — Telephone Encounter (Signed)
Attempted to contact pt to introduce to lung nodule clinic and assist in scheduling follow up CT scan and visit with Sonia Baller. Pt answered the phone and hung up immediately. Will try again next week to contact pt.

## 2021-06-17 ENCOUNTER — Other Ambulatory Visit: Payer: Self-pay

## 2021-06-17 ENCOUNTER — Other Ambulatory Visit: Payer: Managed Care, Other (non HMO)

## 2021-06-17 DIAGNOSIS — C61 Malignant neoplasm of prostate: Secondary | ICD-10-CM

## 2021-06-18 LAB — PSA: Prostate Specific Ag, Serum: 4.4 ng/mL — ABNORMAL HIGH (ref 0.0–4.0)

## 2021-06-20 ENCOUNTER — Other Ambulatory Visit: Payer: Self-pay

## 2021-06-20 ENCOUNTER — Ambulatory Visit: Payer: Managed Care, Other (non HMO) | Admitting: Urology

## 2021-06-20 ENCOUNTER — Encounter: Payer: Self-pay | Admitting: Urology

## 2021-06-20 VITALS — BP 108/60 | HR 68 | Ht 70.5 in | Wt 174.0 lb

## 2021-06-20 DIAGNOSIS — C61 Malignant neoplasm of prostate: Secondary | ICD-10-CM

## 2021-06-20 NOTE — Progress Notes (Signed)
06/20/2021 11:43 AM   Seth Byrd Aug 22, 1946 [id number]  Referring provider: Delsa Grana, PA-C [address] [place],  [place] [address]  Chief Complaint  Patient presents with   Prostate Cancer         Urologic history: 1. 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 PSA 4.8, volume 36 g                         elected active surveillance.   Confirmatory biopsy December 2018 1/12 core positive Gleason 3+3 right base involving 1%   HPI: 75 y.o. male presents for semiannual follow-up of prostate cancer.  No problems since last visit No bothersome LUTS Denies dysuria, gross hematuria Denies flank, abdominal or pelvic pain PSA 06/17/2021 stable at 4.4     PMH: Past Medical History:  Diagnosis Date   Arthritis    Cancer ([place])    CHF (congestive heart failure) ([place])    Hypertension    Myocardial infarction [facility])    Prostate cancer ([place]) 10/19/2017    Surgical History: Past Surgical History:  Procedure Laterality Date   BACK SURGERY     Spinal Fusion with 2 screws   CORONARY ARTERY BYPASS GRAFT N/A 02/10/2017   Procedure: CORONARY ARTERY BYPASS GRAFTING (CABG), ON PUMP, TIMES THREE, USING LEFT INTERNAL MAMMARY ARTERY AND ENDOSCOPICALLY HARVESTED BILATERAL GREATER SAPHENOUS VEINS;  Surgeon: [name], MD;  Location: [facility];  Service: Open Heart Surgery;  Laterality: N/A;  LIMA to LAD SVG to Diag 1 SVG to [place] CATH AND CORONARY ANGIOGRAPHY N/A 02/09/2017   Procedure: Left Heart Cath and Coronary Angiography;  Surgeon: [name], MD;  Location: [facility];  Service: Cardiovascular;  Laterality: N/A;   TEE WITHOUT CARDIOVERSION N/A 02/10/2017   Procedure: TRANSESOPHAGEAL ECHOCARDIOGRAM (TEE);  Surgeon: [name], MD;  Location: [facility];  Service: Open Heart Surgery;  Laterality: N/A;   TOTAL KNEE ARTHROPLASTY Left  04/23/2015   Procedure: TOTAL KNEE ARTHROPLASTY;  Surgeon: Christophe Louis, MD;  Location: [facility];  Service: Orthopedics;  Laterality: Left;    Home Medications:  Allergies as of 06/20/2021   No Known Allergies      Medication List        Accurate as of June 20, 2021 11:43 AM. If you have any questions, ask your nurse or doctor.          acetaminophen 500 MG tablet Commonly known as: TYLENOL Take 500 mg by mouth every 6 (six) hours as needed.   aspirin EC 81 MG tablet Take 81 mg by mouth daily.   atorvastatin 40 MG tablet Commonly known as: LIPITOR Take 1 tablet (40 mg total) by mouth daily at 6 PM.   carvedilol 12.5 MG tablet Commonly known as: COREG Take 12.5 mg by mouth 2 (two) times daily with a meal.   CeraVe Crea Apply topically.   clindamycin 1 % external solution Commonly known as: CLEOCIN T   clobetasol 0.05 % external solution Commonly known as: TEMOVATE APPLY TO SCALP DAILY   Enbrel SureClick 50 MG/ML injection Generic drug: etanercept   Entresto 49-51 MG Generic drug: sacubitril-valsartan TK 1 T PO BID   Fish Oil 1200 MG Caps Take 1 capsule by mouth 2 (two) times daily.   furosemide 40 MG tablet Commonly known as: LASIX Take 1  tablet (40 mg total) by mouth daily.   Humira Pen 40 MG/0.8ML Pnkt Generic drug: Adalimumab   ketoconazole 2 % cream Commonly known as: NIZORAL   ONE-A-DAY MENS 50+ ADVANTAGE PO Take 1 tablet by mouth daily.   spironolactone 25 MG tablet Commonly known as: ALDACTONE Take 1 tablet (25 mg total) by mouth daily.   vitamin B-12 1000 MCG tablet Commonly known as: CYANOCOBALAMIN Take 1,000 mcg by mouth daily.        Allergies: No Known Allergies  Family History: Family History  Problem Relation Age of Onset   Hypertension Mother    Transient ischemic attack Mother    CVA Mother    Colon cancer Father    Hypertension Father    Prostate cancer Father    Diabetes Brother    Kidney cancer Neg Hx     Bladder Cancer Neg Hx     Social History:  reports that he quit smoking about 16 years ago. His smoking use included cigarettes. He has a 52.50 pack-year smoking history. He has never used smokeless tobacco. He reports that he does not drink alcohol and does not use drugs.   Physical Exam: BP 108/60 (BP Location: Left Arm, Patient Position: Sitting, Cuff Size: Normal)   Pulse 68   Ht 5' 10.5" (1.791 m)   Wt 174 lb (78.9 kg)   BMI 24.61 kg/m   Constitutional:  Alert and oriented, No acute distress. HEENT: [place] AT, moist mucus membranes.  Trachea midline, no masses. Cardiovascular: No clubbing, cyanosis, or edema. Respiratory: Normal respiratory effort, no increased work of breathing. Skin: No rashes, bruises or suspicious lesions. Neurologic: Grossly intact, no focal deficits, moving all 4 extremities. Psychiatric: Normal mood and affect.   Assessment & Plan:    1.  T1c low risk prostate cancer Stable and he desires to continue active surveillance Follow-up 6 months for PSA/DRE   Abbie Sons, MD  [place] [address] [place], [place] [address] [phone]

## 2021-06-21 ENCOUNTER — Encounter: Payer: Self-pay | Admitting: Urology

## 2021-08-29 ENCOUNTER — Other Ambulatory Visit: Payer: Self-pay

## 2021-08-29 ENCOUNTER — Encounter: Payer: Self-pay | Admitting: Nurse Practitioner

## 2021-08-29 ENCOUNTER — Ambulatory Visit (INDEPENDENT_AMBULATORY_CARE_PROVIDER_SITE_OTHER): Payer: Managed Care, Other (non HMO) | Admitting: Nurse Practitioner

## 2021-08-29 VITALS — BP 124/82 | HR 58 | Temp 97.9°F | Resp 18 | Ht 70.0 in | Wt 174.0 lb

## 2021-08-29 DIAGNOSIS — Z5181 Encounter for therapeutic drug level monitoring: Secondary | ICD-10-CM

## 2021-08-29 DIAGNOSIS — M069 Rheumatoid arthritis, unspecified: Secondary | ICD-10-CM | POA: Diagnosis not present

## 2021-08-29 DIAGNOSIS — I5022 Chronic systolic (congestive) heart failure: Secondary | ICD-10-CM

## 2021-08-29 DIAGNOSIS — E782 Mixed hyperlipidemia: Secondary | ICD-10-CM

## 2021-08-29 DIAGNOSIS — I10 Essential (primary) hypertension: Secondary | ICD-10-CM | POA: Diagnosis not present

## 2021-08-29 DIAGNOSIS — Z1212 Encounter for screening for malignant neoplasm of rectum: Secondary | ICD-10-CM

## 2021-08-29 DIAGNOSIS — Z131 Encounter for screening for diabetes mellitus: Secondary | ICD-10-CM

## 2021-08-29 DIAGNOSIS — Z1211 Encounter for screening for malignant neoplasm of colon: Secondary | ICD-10-CM

## 2021-08-29 DIAGNOSIS — Z136 Encounter for screening for cardiovascular disorders: Secondary | ICD-10-CM

## 2021-08-29 DIAGNOSIS — Z0000 Encounter for general adult medical examination without abnormal findings: Secondary | ICD-10-CM | POA: Diagnosis not present

## 2021-08-29 DIAGNOSIS — Z13220 Encounter for screening for lipoid disorders: Secondary | ICD-10-CM

## 2021-08-29 DIAGNOSIS — Z951 Presence of aortocoronary bypass graft: Secondary | ICD-10-CM

## 2021-08-29 NOTE — Progress Notes (Signed)
Name: Seth Byrd   MRN: [id number]    DOB: 20-Nov-1945   Date:08/29/2021       Progress Note  Subjective  Physical  Chief Complaint  Patient presents with   Annual Exam    HPI  Patient presents for annual CPE here alone.  IPSS Questionnaire (AUA-7): Over the past month.   1)  How often have you had a sensation of not emptying your bladder completely after you finish urinating?  0 - Not at all  2)  How often have you had to urinate again less than two hours after you finished urinating? 0 - Not at all  3)  How often have you found you stopped and started again several times when you urinated?  0 - Not at all  4) How difficult have you found it to postpone urination?  0 - Not at all  5) How often have you had a weak urinary stream?  0 - Not at all  6) How often have you had to push or strain to begin urination?  0 - Not at all  7) How many times did you most typically get up to urinate from the time you went to bed until the time you got up in the morning?  1 - 1 time  Total score:  0-7 mildly symptomatic   8-19 moderately symptomatic   20-35 severely symptomatic     Diet: Well balanced diet, eats mostly at home Exercise: exercise bike, does most days of the week, discussed 150 min a week  recommendation  Depression: phq 9 is negative Depression screen [facility] 2/9 08/29/2021 06/05/2021 08/26/2020 08/25/2019 08/19/2018  Decreased Interest 0 0 0 0 0  Down, Depressed, Hopeless 0 0 0 0 0  PHQ - 2 Score 0 0 0 0 0  Altered sleeping - - - 0 0  Tired, decreased energy - - - 0 0  Change in appetite - - - 0 0  Feeling bad or failure about yourself  - - - 0 0  Trouble concentrating - - - 0 0  Moving slowly or fidgety/restless - - - 0 0  Suicidal thoughts - - - 0 0  PHQ-9 Score - - - 0 0  Difficult doing work/chores - - - Not difficult at all Not difficult at all    Hypertension:  BP Readings from Last 3 Encounters:  08/29/21 124/82  06/20/21 108/60  12/18/20 107/62     Obesity: Wt Readings from Last 3 Encounters:  08/29/21 174 lb (78.9 kg)  06/20/21 174 lb (78.9 kg)  12/18/20 179 lb (81.2 kg)   BMI Readings from Last 3 Encounters:  08/29/21 24.97 kg/m  06/20/21 24.61 kg/m  12/18/20 25.32 kg/m     Lipids:  Lab Results  Component Value Date   CHOL 122 08/26/2020   CHOL 106 08/25/2019   CHOL CANCELED 06/22/2018   CHOL 111 06/22/2018   Lab Results  Component Value Date   HDL 36 (L) 08/26/2020   HDL 35 (L) 08/25/2019   HDL CANCELED 06/22/2018   HDL 36 (L) 06/22/2018   Lab Results  Component Value Date   LDLCALC 67 08/26/2020   LDLCALC 56 08/25/2019   LDLCALC 60 06/22/2018   Lab Results  Component Value Date   TRIG 104 08/26/2020   TRIG 73 08/25/2019   TRIG CANCELED 06/22/2018   TRIG 75 06/22/2018   Lab Results  Component Value Date   CHOLHDL 3.4 08/26/2020   CHOLHDL 3.0 08/25/2019  CHOLHDL 3.1 06/22/2018   No results found for: LDLDIRECT Glucose:  Glucose  Date Value Ref Range Status  08/26/2020 86 65 - 99 mg/dL Final  08/25/2019 91 65 - 99 mg/dL Final  06/22/2018 103 (H) 65 - 99 mg/dL Final   Glucose, Bld  Date Value Ref Range Status  07/17/2018 141 (H) 70 - 99 mg/dL Final  05/03/2017 108 (H) 65 - 99 mg/dL Final  02/25/2017 90 65 - 99 mg/dL Final   Glucose-Capillary  Date Value Ref Range Status  07/17/2018 132 (H) 70 - 99 mg/dL Final  02/17/2017 150 (H) 65 - 99 mg/dL Final  02/17/2017 90 65 - 99 mg/dL Final    Flowsheet Row Office Visit from 08/29/2021 in [facility]  AUDIT-C Score 0       Divorced STD testing and prevention (HIV/chl/gon/syphilis): declined Hep C: 08/13/2017  Skin cancer: Discussed monitoring for atypical lesions Colorectal cancer: cologuard 10/04/2017, due for another will order Prostate cancer: last PSA was done 06/17/21, was 4.4, Dr. Bernardo Heater (urologist) following. T1c adenocarcinoma prostate- monitoring with PSA  Lung cancer:  Low Dose CT Chest recommended if  Age 28-80 years, 30 pack-year currently smoking OR have quit w/in 15years. Patient does not qualify.  Last one was done last year.  AAA: The USPSTF recommends one-time screening with ultrasonography in men ages 62 to 62 years who have ever smoked ECG:  07/18/2018  Vaccines:  HPV: up to at age 37 , ask insurance if age between 68-45  Shingrix: 2-64 yo and ask insurance if covered when patient above 21 yo Pneumonia: educated and discussed with patient. Flu: educated and discussed with patient.  Advanced Care Planning: A voluntary discussion about advance care planning including the explanation and discussion of advance directives.  Discussed health care proxy and Living will, and the patient was able to identify a health care proxy as Arline Asp, daughter.  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   History of heart attack 02/09/2017   Essential hypertension 11/25/2015   Hyperlipidemia 11/25/2015   Panlobular emphysema ([place]) 11/25/2015   Status post total left knee replacement 11/25/2015   Primary osteoarthritis involving multiple joints 11/25/2015   Primary osteoarthritis of knee 04/23/2015    Past Surgical History:  Procedure Laterality Date   BACK SURGERY     Spinal Fusion with 2 screws   CORONARY ARTERY BYPASS GRAFT N/A 02/10/2017   Procedure: CORONARY ARTERY BYPASS GRAFTING (CABG), ON PUMP, TIMES THREE, USING LEFT INTERNAL MAMMARY ARTERY AND ENDOSCOPICALLY HARVESTED BILATERAL GREATER SAPHENOUS VEINS;  Surgeon: [name], MD;  Location: [facility];  Service: Open Heart Surgery;  Laterality: N/A;  LIMA to LAD SVG to Diag 1 SVG to [place] CATH AND CORONARY ANGIOGRAPHY N/A 02/09/2017   Procedure: Left Heart Cath and Coronary Angiography;  Surgeon: [name], MD;  Location: [facility];  Service: Cardiovascular;  Laterality: N/A;   TEE WITHOUT CARDIOVERSION N/A 02/10/2017   Procedure: TRANSESOPHAGEAL ECHOCARDIOGRAM (TEE);  Surgeon: [name], MD;  Location: [facility];  Service: Open Heart Surgery;  Laterality: N/A;   TOTAL KNEE ARTHROPLASTY Left 04/23/2015  Procedure: TOTAL KNEE ARTHROPLASTY;  Surgeon: Christophe Louis, MD;  Location: [facility];  Service: Orthopedics;  Laterality: Left;    Family History  Problem Relation Age of Onset   Hypertension Mother    Transient ischemic attack Mother    CVA Mother    Colon cancer Father    Hypertension Father    Prostate cancer Father    Diabetes Brother    Kidney cancer Neg Hx    Bladder Cancer Neg Hx     Social History   Socioeconomic History   Marital status: Divorced    Spouse name: Not on file   Number of children: 2   Years of education: 12   Highest education level: Associate degree: academic program  Occupational History   Not on file  Tobacco Use   Smoking status: Former    Packs/day: 1.50    Years: 35.00    Pack years: 52.50    Types: Cigarettes    Quit date: 04/09/2005    Years since quitting: 16.4   Smokeless tobacco: Never  Vaping Use   Vaping Use: Never used  Substance and Sexual Activity   Alcohol use: No   Drug use: No   Sexual activity: Yes    Birth control/protection: None  Other Topics Concern   Not on file  Social History Narrative   Not on file   Social Determinants of Health   Financial Resource Strain: Low Risk    Difficulty of Paying Living Expenses: Not hard at all  Food Insecurity: No Food Insecurity   Worried About Charity fundraiser in the Last Year: Never true   [place] in the Last Year: Never true  Transportation Needs: No Transportation Needs   Lack of Transportation (Medical): No   Lack  of Transportation (Non-Medical): No  Physical Activity: Not on file  Stress: No Stress Concern Present   Feeling of Stress : Only a little  Social Connections: Moderately Isolated   Frequency of Communication with Friends and Family: Once a week   Frequency of Social Gatherings with Friends and Family: Once a week   Attends Religious Services: More than 4 times per year   Active Member of [employer] or Organizations: Yes   Attends Music therapist: More than 4 times per year   Marital Status: Divorced  Human resources officer Violence: Not At Risk   Fear of Current or Ex-Partner: No   Emotionally Abused: No   Physically Abused: No   Sexually Abused: No     Current Outpatient Medications:    acetaminophen (TYLENOL) 500 MG tablet, Take 500 mg by mouth every 6 (six) hours as needed., Disp: , Rfl:    aspirin EC 81 MG tablet, Take 81 mg by mouth daily., Disp: , Rfl:    atorvastatin (LIPITOR) 40 MG tablet, Take 1 tablet (40 mg total) by mouth daily at 6 PM., Disp: , Rfl:    carvedilol (COREG) 12.5 MG tablet, Take 12.5 mg by mouth 2 (two) times daily with a meal., Disp: , Rfl: 3   clindamycin (CLEOCIN T) 1 % external solution, , Disp: , Rfl:    clobetasol (TEMOVATE) 0.05 % external solution, APPLY TO SCALP DAILY, Disp: , Rfl: 5   Emollient (CERAVE) CREA, Apply topically., Disp: , Rfl:    ENTRESTO 49-51 MG, TK 1 T PO BID, Disp: , Rfl:    furosemide (LASIX) 40 MG tablet, Take 1 tablet (40 mg total) by mouth daily., Disp: 30 tablet, Rfl:  HUMIRA PEN 40 MG/0.8ML PNKT, , Disp: , Rfl:    ketoconazole (NIZORAL) 2 % cream, , Disp: , Rfl:    Multiple Vitamins-Minerals (ONE-A-DAY MENS 50+ ADVANTAGE PO), Take 1 tablet by mouth daily., Disp: , Rfl:    Omega-3 Fatty Acids (FISH OIL) 1200 MG CAPS, Take 1 capsule by mouth 2 (two) times daily. , Disp: , Rfl:    spironolactone (ALDACTONE) 25 MG tablet, Take 1 tablet (25 mg total) by mouth daily., Disp: , Rfl:    vitamin B-12 (CYANOCOBALAMIN) 1000 MCG  tablet, Take 1,000 mcg by mouth daily. , Disp: , Rfl:    ENBREL SURECLICK 50 MG/ML injection, , Disp: , Rfl:   No Known Allergies   ROS  Constitutional: Negative for fever or weight change.  Respiratory: Negative for cough and shortness of breath.   Cardiovascular: Negative for chest pain or palpitations.  Gastrointestinal: Negative for abdominal pain, no bowel changes.  Musculoskeletal: Negative for gait problem or joint swelling.  Skin: Negative for rash.  Neurological: Negative for dizziness or headache.  No other specific complaints in a complete review of systems (except as listed in HPI above).    Objective  Vitals:   08/29/21 0821  BP: 124/82  Pulse: (!) 58  Resp: 18  Temp: 97.9 F (36.6 C)  TempSrc: Oral  SpO2: 97%  Weight: 174 lb (78.9 kg)  Height: 5\' 10"  (1.778 m)    Body mass index is 24.97 kg/m.  Physical Exam  Constitutional: Patient appears well-developed and well-nourished. No distress.  HENT: Head: Normocephalic and atraumatic. Ears: B TMs ok, no erythema or effusion; Nose: Nose normal. Mouth/Throat: not done Eyes: Conjunctivae and EOM are normal. Pupils are equal, round, and reactive to light. No scleral icterus.  Neck: Normal range of motion. Neck supple. No JVD present. No thyromegaly present.  Cardiovascular: Normal rate, regular rhythm and normal heart sounds.  No murmur heard. No BLE edema. Pulmonary/Chest: Effort normal and breath sounds normal. No respiratory distress. Abdominal: Soft. Bowel sounds are normal, no distension. There is no tenderness. no masses MALE GENITALIA: not done RECTAL: not done Musculoskeletal: Normal range of motion, no joint effusions. No gross deformities Neurological: he is alert and oriented to person, place, and time. No cranial nerve deficit. Coordination, balance, strength, speech and gait are normal.  Skin: Skin is warm and dry. No rash noted. No erythema.  Psychiatric: Patient has a normal mood and affect.  behavior is normal. Judgment and thought content normal.   Recent Results (from the past 2160 hour(s))  Novel Coronavirus, NAA ([facility])     Status: None   Collection Time: 06/05/21 12:00 AM   Specimen: Nasopharyngeal(NP) swabs in vial transport medium   Nasopharynge  Is this  Result Value Ref Range   SARS-CoV-2, NAA Not Detected Not Detected    Comment: This nucleic acid amplification test was developed and its performance characteristics determined by [employer]. Nucleic acid amplification tests include RT-PCR and TMA. This test has not been FDA cleared or approved. This test has been authorized by FDA under an Emergency Use Authorization (EUA). This test is only authorized for the duration of time the declaration that circumstances exist justifying the authorization of the emergency use of in vitro diagnostic tests for detection of SARS-CoV-2 virus and/or diagnosis of COVID-19 infection under section 564(b)(1) of the Act, 21 U.S.C. 277OEU-2(P) (1), unless the authorization is terminated or revoked sooner. When diagnostic testing is negative, the possibility of a false negative result should be considered in  the context of a patient's recent exposures and the presence of clinical signs and symptoms consistent with COVID-19. An individual without symptoms of COVID-19 and who is not shedding SARS-CoV-2 virus wo uld expect to have a negative (not detected) result in this assay.   SARS-COV-2, NAA 2 DAY TAT     Status: None   Collection Time: 06/05/21 12:00 AM   Nasopharynge  Is this  Result Value Ref Range   SARS-CoV-2, NAA 2 DAY TAT Performed   Specimen status report     Status: None   Collection Time: 06/05/21 12:00 AM  Result Value Ref Range   specimen status report Comment     Comment: Please note Please note The date and/or time of collection was not indicated on the requisition as required by state and federal law.  The date of receipt of the specimen was used as  the collection date if not supplied.   PSA     Status: Abnormal   Collection Time: 06/17/21  1:09 PM  Result Value Ref Range   Prostate Specific Ag, Serum 4.4 (H) 0.0 - 4.0 ng/mL    Comment: Roche ECLIA methodology. According to the American Urological Association, Serum PSA should decrease and remain at undetectable levels after radical prostatectomy. The AUA defines biochemical recurrence as an initial PSA value 0.2 ng/mL or greater followed by a subsequent confirmatory PSA value 0.2 ng/mL or greater. Values obtained with different assay methods or kits cannot be used interchangeably. Results cannot be interpreted as absolute evidence of the presence or absence of malignant disease.      Fall Risk: Fall Risk  08/29/2021 06/05/2021 08/26/2020 08/25/2019 08/19/2018  Falls in the past year? 0 0 0 0 No  Number falls in past yr: 0 0 0 0 -  Injury with Fall? 0 0 0 0 -  Follow up Falls evaluation completed Falls evaluation completed Falls evaluation completed - -     Functional Status Survey: Is the patient deaf or have difficulty hearing?: No Does the patient have difficulty seeing, even when wearing glasses/contacts?: No Does the patient have difficulty concentrating, remembering, or making decisions?: No Does the patient have difficulty walking or climbing stairs?: No Does the patient have difficulty dressing or bathing?: No Does the patient have difficulty doing errands alone such as visiting a doctor's office or shopping?: No    Assessment & Plan  1. Physical exam, annual  - Cologuard - CBC with Differential/Platelet - Lipid panel - Hemoglobin A1c - Comprehensive metabolic panel - US AORTA DUPLEX LIMITED; Future  2. Essential hypertension  - CBC with Differential/Platelet - Comprehensive metabolic panel  3. Mixed hyperlipidemia  - Lipid panel  4. Rheumatoid arthritis involving multiple sites, unspecified whether rheumatoid factor present ([place])   5. Chronic  systolic CHF (congestive heart failure) ([place])   6. S/P CABG x 3   7. Screening for colorectal cancer  - Cologuard  8. Encounter for medication monitoring  - Comprehensive metabolic panel  9. Screening for diabetes mellitus  - Hemoglobin A1c - Comprehensive metabolic panel  10. Screening for cholesterol level  - Lipid panel  11. Encounter for abdominal aortic aneurysm (AAA) screening  - US AORTA DUPLEX LIMITED; Future    -Prostate cancer screening and PSA options (with potential risks and benefits of testing vs not testing) were discussed along with recent recs/guidelines. -USPSTF grade A and B recommendations reviewed with patient; age-appropriate recommendations, preventive care, screening tests, etc discussed and encouraged; healthy living encouraged; see AVS for  patient education given to patient -Discussed importance of 150 minutes of physical activity weekly, eat two servings of fish weekly, eat one serving of tree nuts ( cashews, pistachios, pecans, almonds.Marland Kitchen) every other day, eat 6 servings of fruit/vegetables daily and drink plenty of water and avoid sweet beverages.

## 2021-08-29 NOTE — Patient Instructions (Signed)
Preventive Care 74 Years and Older, Male Preventive care refers to lifestyle choices and visits with your health care provider that can promote health and wellness. This includes: A yearly physical exam. This is also called an annual wellness visit. Regular dental and eye exams. Immunizations. Screening for certain conditions. Healthy lifestyle choices, such as: Eating a healthy diet. Getting regular exercise. Not using drugs or products that contain nicotine and tobacco. Limiting alcohol use. What can I expect for my preventive care visit? Physical exam Your health care provider will check your: Height and weight. These may be used to calculate your BMI (body mass index). BMI is a measurement that tells if you are at a healthy weight. Heart rate and blood pressure. Body temperature. Skin for abnormal spots. Counseling Your health care provider may ask you questions about your: Past medical problems. Family's medical history. Alcohol, tobacco, and drug use. Emotional well-being. Home life and relationship well-being. Sexual activity. Diet, exercise, and sleep habits. History of falls. Memory and ability to understand (cognition). Work and work Statistician. Access to firearms. What immunizations do I need? Vaccines are usually given at various ages, according to a schedule. Your health care provider will recommend vaccines for you based on your age, medical history, and lifestyle or other factors, such as travel or where you work. What tests do I need? Blood tests Lipid and cholesterol levels. These may be checked every 5 years, or more often depending on your overall health. Hepatitis C test. Hepatitis B test. Screening Lung cancer screening. You may have this screening every year starting at age 30 if you have a 30-pack-year history of smoking and currently smoke or have quit within the past 15 years. Colorectal cancer screening. All adults should have this screening  starting at age 63 and continuing until age 11. Your health care provider may recommend screening at age 63 if you are at increased risk. You will have tests every 1-10 years, depending on your results and the type of screening test. Prostate cancer screening. Recommendations will vary depending on your family history and other risks. Genital exam to check for testicular cancer or hernias. Diabetes screening. This is done by checking your blood sugar (glucose) after you have not eaten for a while (fasting). You may have this done every 1-3 years. Abdominal aortic aneurysm (AAA) screening. You may need this if you are a current or former smoker. STD (sexually transmitted disease) testing, if you are at risk. Follow these instructions at home: Eating and drinking  Eat a diet that includes fresh fruits and vegetables, whole grains, lean protein, and low-fat dairy products. Limit your intake of foods with high amounts of sugar, saturated fats, and salt. Take vitamin and mineral supplements as recommended by your health care provider. Do not drink alcohol if your health care provider tells you not to drink. If you drink alcohol: Limit how much you have to 0-2 drinks a day. Be aware of how much alcohol is in your drink. In the [place] one drink equals one 12 oz bottle of beer (355 mL), one 5 oz glass of wine (148 mL), or one 1 oz glass of hard liquor (44 mL). Lifestyle Take daily care of your teeth and gums. Brush your teeth every morning and night with fluoride toothpaste. Floss one time each day. Stay active. Exercise for at least 30 minutes 5 or more days each week. Do not use any products that contain nicotine or tobacco, such as cigarettes, e-cigarettes, and chewing tobacco. If  you need help quitting, ask your health care provider. Do not use drugs. If you are sexually active, practice safe sex. Use a condom or other form of protection to prevent STIs (sexually transmitted infections). Talk  with your health care provider about taking a low-dose aspirin or statin. Find healthy ways to cope with stress, such as: Meditation, yoga, or listening to music. Journaling. Talking to a trusted person. Spending time with friends and family. Safety Always wear your seat belt while driving or riding in a vehicle. Do not drive: If you have been drinking alcohol. Do not ride with someone who has been drinking. When you are tired or distracted. While texting. Wear a helmet and other protective equipment during sports activities. If you have firearms in your house, make sure you follow all gun safety procedures. What's next? Visit your health care provider once a year for an annual wellness visit. Ask your health care provider how often you should have your eyes and teeth checked. Stay up to date on all vaccines. This information is not intended to replace advice given to you by your health care provider. Make sure you discuss any questions you have with your health care provider. Document Revised: 01/03/2021 Document Reviewed: 10/20/2018 Elsevier Patient Education  2022 [employer].

## 2021-08-30 LAB — COMPREHENSIVE METABOLIC PANEL
ALT: 20 IU/L (ref 0–44)
AST: 21 IU/L (ref 0–40)
Albumin/Globulin Ratio: 1.4 (ref 1.2–2.2)
Albumin: 4.6 g/dL (ref 3.7–4.7)
Alkaline Phosphatase: 52 IU/L (ref 44–121)
BUN/Creatinine Ratio: 25 — ABNORMAL HIGH (ref 10–24)
BUN: 24 mg/dL (ref 8–27)
Bilirubin Total: 0.8 mg/dL (ref 0.0–1.2)
CO2: 24 mmol/L (ref 20–29)
Calcium: 10.4 mg/dL — ABNORMAL HIGH (ref 8.6–10.2)
Chloride: 104 mmol/L (ref 96–106)
Creatinine, Ser: 0.96 mg/dL (ref 0.76–1.27)
Globulin, Total: 3.4 g/dL (ref 1.5–4.5)
Glucose: 104 mg/dL — ABNORMAL HIGH (ref 70–99)
Potassium: 4.1 mmol/L (ref 3.5–5.2)
Sodium: 142 mmol/L (ref 134–144)
Total Protein: 8 g/dL (ref 6.0–8.5)
eGFR: 83 mL/min/{1.73_m2} (ref >59)

## 2021-08-30 LAB — CBC WITH DIFFERENTIAL/PLATELET
Basophils Absolute: 0 10*3/uL (ref 0.0–0.2)
Basos: 1 %
EOS (ABSOLUTE): 0.1 10*3/uL (ref 0.0–0.4)
Eos: 3 %
Hematocrit: 37.7 % (ref 37.5–51.0)
Hemoglobin: 13 g/dL (ref 13.0–17.7)
Immature Grans (Abs): 0 10*3/uL (ref 0.0–0.1)
Immature Granulocytes: 0 %
Lymphocytes Absolute: 1.7 10*3/uL (ref 0.7–3.1)
Lymphs: 48 %
MCH: 32.7 pg (ref 26.6–33.0)
MCHC: 34.5 g/dL (ref 31.5–35.7)
MCV: 95 fL (ref 79–97)
Monocytes Absolute: 0.4 10*3/uL (ref 0.1–0.9)
Monocytes: 12 %
Neutrophils Absolute: 1.2 10*3/uL — ABNORMAL LOW (ref 1.4–7.0)
Neutrophils: 36 %
Platelets: 175 10*3/uL (ref 150–450)
RBC: 3.98 x10E6/uL — ABNORMAL LOW (ref 4.14–5.80)
RDW: 10.9 % — ABNORMAL LOW (ref 11.6–15.4)
WBC: 3.5 10*3/uL (ref 3.4–10.8)

## 2021-08-30 LAB — LIPID PANEL
Chol/HDL Ratio: 3.4 ratio (ref 0.0–5.0)
Cholesterol, Total: 112 mg/dL (ref 100–199)
HDL: 33 mg/dL — ABNORMAL LOW (ref >39)
LDL Chol Calc (NIH): 61 mg/dL (ref 0–99)
Triglycerides: 92 mg/dL (ref 0–149)
VLDL Cholesterol Cal: 18 mg/dL (ref 5–40)

## 2021-08-30 LAB — HEMOGLOBIN A1C
Est. average glucose Bld gHb Est-mCnc: 105 mg/dL
Hgb A1c MFr Bld: 5.3 % (ref 4.8–5.6)

## 2021-09-14 LAB — COLOGUARD: COLOGUARD: POSITIVE — AB

## 2021-09-15 ENCOUNTER — Other Ambulatory Visit: Payer: Self-pay | Admitting: Emergency Medicine

## 2021-09-15 DIAGNOSIS — R195 Other fecal abnormalities: Secondary | ICD-10-CM

## 2021-09-29 ENCOUNTER — Other Ambulatory Visit: Payer: Self-pay

## 2021-09-29 ENCOUNTER — Telehealth: Payer: Self-pay

## 2021-09-29 DIAGNOSIS — R195 Other fecal abnormalities: Secondary | ICD-10-CM

## 2021-09-29 DIAGNOSIS — Z1211 Encounter for screening for malignant neoplasm of colon: Secondary | ICD-10-CM

## 2021-09-29 MED ORDER — CLENPIQ 10-3.5-12 MG-GM -GM/160ML PO SOLN: 1.0000 | Freq: Once | ORAL | 0 refills | Status: AC

## 2021-09-29 NOTE — Telephone Encounter (Signed)
Informed pt's daughter: medical clearance came back and pt is cleared to have procedure. Left detailed message on VM.

## 2021-09-29 NOTE — Progress Notes (Signed)
Gastroenterology Pre-Procedure Review  Request Date: 10/31/21 Requesting Physician: Dr. Bonna Gains  PATIENT REVIEW QUESTIONS: The patient responded to the following health history questions as indicated:    1. Are you having any GI issues? no 2. Do you have a personal history of Polyps? Positive Cologuard Test 3. Do you have a family history of Colon Cancer or Polyps? no 4. Diabetes Mellitus? no 5. Joint replacements in the past 12 months?no 6. Major health problems in the past 3 months?no 7. Any artificial heart valves, MVP, or defibrillator?no    MEDICATIONS & ALLERGIES:    Patient reports the following regarding taking any anticoagulation/antiplatelet therapy:   Plavix, Coumadin, Eliquis, Xarelto, Lovenox, Pradaxa, Brilinta, or Effient?  Patient does have congestive heart failure. Aspirin? yes (81 mg)  Patient confirms/reports the following medications:  Current Outpatient Medications  Medication Sig Dispense Refill   acetaminophen (TYLENOL) 500 MG tablet Take 500 mg by mouth every 6 (six) hours as needed.     aspirin EC 81 MG tablet Take 81 mg by mouth daily.     atorvastatin (LIPITOR) 40 MG tablet Take 1 tablet (40 mg total) by mouth daily at 6 PM.     carvedilol (COREG) 12.5 MG tablet Take 12.5 mg by mouth 2 (two) times daily with a meal.  3   clindamycin (CLEOCIN T) 1 % external solution      clobetasol (TEMOVATE) 0.05 % external solution APPLY TO SCALP DAILY  5   Emollient (CERAVE) CREA Apply topically.     ENTRESTO 49-51 MG TK 1 T PO BID     furosemide (LASIX) 40 MG tablet Take 1 tablet (40 mg total) by mouth daily. 30 tablet    HUMIRA PEN 40 MG/0.8ML PNKT      ketoconazole (NIZORAL) 2 % cream      Multiple Vitamins-Minerals (ONE-A-DAY MENS 50+ ADVANTAGE PO) Take 1 tablet by mouth daily.     Omega-3 Fatty Acids (FISH OIL) 1200 MG CAPS Take 1 capsule by mouth 2 (two) times daily.      spironolactone (ALDACTONE) 25 MG tablet Take 1 tablet (25 mg total) by mouth daily.      vitamin B-12 (CYANOCOBALAMIN) 1000 MCG tablet Take 1,000 mcg by mouth daily.      No current facility-administered medications for this visit.    Patient confirms/reports the following allergies:  No Known Allergies  No orders of the defined types were placed in this encounter.   AUTHORIZATION INFORMATION Primary Insurance: 1D#: Group #:  Secondary Insurance: 1D#: Group #:  SCHEDULE INFORMATION: Date: 10/31/21  Time: Location: [place]

## 2021-10-30 ENCOUNTER — Encounter: Payer: Self-pay | Admitting: Gastroenterology

## 2021-10-31 ENCOUNTER — Encounter
Admission: RE | Disposition: A | Discharge status: Home / Self Care | Payer: Self-pay | Source: Home / Self Care | Attending: Gastroenterology

## 2021-10-31 ENCOUNTER — Encounter: Payer: Self-pay | Admitting: Gastroenterology

## 2021-10-31 ENCOUNTER — Ambulatory Visit: Payer: Managed Care, Other (non HMO) | Admitting: Certified Registered"

## 2021-10-31 ENCOUNTER — Ambulatory Visit
Admission: RE | Admit: 2021-10-31 | Discharge: 2021-10-31 | Disposition: A | Discharge status: Home / Self Care | Payer: Managed Care, Other (non HMO) | Attending: Gastroenterology | Admitting: Gastroenterology

## 2021-10-31 DIAGNOSIS — D122 Benign neoplasm of ascending colon: Secondary | ICD-10-CM | POA: Diagnosis not present

## 2021-10-31 DIAGNOSIS — I252 Old myocardial infarction: Secondary | ICD-10-CM | POA: Insufficient documentation

## 2021-10-31 DIAGNOSIS — D125 Benign neoplasm of sigmoid colon: Secondary | ICD-10-CM | POA: Insufficient documentation

## 2021-10-31 DIAGNOSIS — K5730 Diverticulosis of large intestine without perforation or abscess without bleeding: Secondary | ICD-10-CM | POA: Insufficient documentation

## 2021-10-31 DIAGNOSIS — Z951 Presence of aortocoronary bypass graft: Secondary | ICD-10-CM | POA: Insufficient documentation

## 2021-10-31 DIAGNOSIS — J449 Chronic obstructive pulmonary disease, unspecified: Secondary | ICD-10-CM | POA: Diagnosis not present

## 2021-10-31 DIAGNOSIS — R195 Other fecal abnormalities: Secondary | ICD-10-CM

## 2021-10-31 DIAGNOSIS — I110 Hypertensive heart disease with heart failure: Secondary | ICD-10-CM | POA: Diagnosis not present

## 2021-10-31 DIAGNOSIS — D124 Benign neoplasm of descending colon: Secondary | ICD-10-CM | POA: Insufficient documentation

## 2021-10-31 DIAGNOSIS — Z79899 Other long term (current) drug therapy: Secondary | ICD-10-CM | POA: Diagnosis not present

## 2021-10-31 DIAGNOSIS — I509 Heart failure, unspecified: Secondary | ICD-10-CM | POA: Insufficient documentation

## 2021-10-31 DIAGNOSIS — Z87891 Personal history of nicotine dependence: Secondary | ICD-10-CM | POA: Diagnosis not present

## 2021-10-31 DIAGNOSIS — I2510 Atherosclerotic heart disease of native coronary artery without angina pectoris: Secondary | ICD-10-CM | POA: Insufficient documentation

## 2021-10-31 DIAGNOSIS — K635 Polyp of colon: Secondary | ICD-10-CM

## 2021-10-31 DIAGNOSIS — Z1211 Encounter for screening for malignant neoplasm of colon: Secondary | ICD-10-CM

## 2021-10-31 HISTORY — PX: COLONOSCOPY WITH PROPOFOL: SHX5780

## 2021-10-31 SURGERY — COLONOSCOPY WITH PROPOFOL
Anesthesia: General

## 2021-10-31 MED ORDER — LIDOCAINE HCL (CARDIAC) PF 100 MG/5ML IV SOSY
PREFILLED_SYRINGE | INTRAVENOUS | Status: DC | PRN
  Administered 2021-10-31: 20 mg via INTRATRACHEAL
  Administered 2021-10-31: 80 mg via INTRATRACHEAL

## 2021-10-31 MED ORDER — PROPOFOL 10 MG/ML IV BOLUS
INTRAVENOUS | Status: DC | PRN
  Administered 2021-10-31 (×2): 30 mg via INTRAVENOUS
  Administered 2021-10-31 (×2): 20 mg via INTRAVENOUS

## 2021-10-31 MED ORDER — PHENYLEPHRINE HCL (PRESSORS) 10 MG/ML IV SOLN
INTRAVENOUS | Status: DC | PRN
  Administered 2021-10-31 (×2): 160 ug via INTRAVENOUS
  Administered 2021-10-31: 80 ug via INTRAVENOUS

## 2021-10-31 MED ORDER — PROPOFOL 500 MG/50ML IV EMUL
INTRAVENOUS | Status: DC | PRN
  Administered 2021-10-31: 155 ug/kg/min via INTRAVENOUS

## 2021-10-31 MED ORDER — SODIUM CHLORIDE 0.9 % IV SOLN
INTRAVENOUS | Status: DC
  Administered 2021-10-31: 09:00:00 20 mL/h via INTRAVENOUS

## 2021-10-31 NOTE — Transfer of Care (Signed)
Immediate Anesthesia Transfer of Care Note  Patient: Seth Byrd  Procedure(s) Performed: COLONOSCOPY WITH PROPOFOL  Patient Location: Endoscopy Unit  Anesthesia Type:General  Level of Consciousness: awake, drowsy and patient cooperative  Airway & Oxygen Therapy: Patient Spontanous Breathing and Patient connected to face mask oxygen  Post-op Assessment: Report given to RN and Post -op Vital signs reviewed and stable  Post vital signs: Reviewed and stable  Last Vitals:  Vitals Value Taken Time  BP 92/59 10/31/21 1055  Temp 36.5 C 10/31/21 1053  Pulse 63 10/31/21 1100  Resp 20 10/31/21 1100  SpO2 95 % 10/31/21 1100  Vitals shown include unvalidated device data.  Last Pain:  Vitals:   10/31/21 1053  TempSrc: Temporal  PainSc: Asleep         Complications: No notable events documented.

## 2021-10-31 NOTE — Anesthesia Postprocedure Evaluation (Signed)
Anesthesia Post Note  Patient: Seth Byrd  Procedure(s) Performed: COLONOSCOPY WITH PROPOFOL  Patient location during evaluation: Phase II Anesthesia Type: General Level of consciousness: awake and alert, awake and oriented Pain management: pain level controlled Vital Signs Assessment: post-procedure vital signs reviewed and stable Respiratory status: spontaneous breathing, nonlabored ventilation and respiratory function stable Cardiovascular status: blood pressure returned to baseline and stable Postop Assessment: no apparent nausea or vomiting Anesthetic complications: no   No notable events documented.   Last Vitals:  Vitals:   10/31/21 1113 10/31/21 1123  BP: 93/63 109/65  Pulse:    Resp:    Temp:    SpO2:      Last Pain:  Vitals:   10/31/21 1123  TempSrc:   PainSc: 0-No pain                 Phill Mutter

## 2021-10-31 NOTE — Op Note (Signed)
[facility] Gastroenterology Patient Name: Seth Byrd Procedure Date: 10/31/2021 9:57 AM MRN: [id number] Account #: [id number] Date of Birth: 1946-07-29 Admit Type: Outpatient Age: 75 Room: [facility] ENDO ROOM 2 Gender: Male Note Status: Finalized Instrument Name: Colonoscope [id number] Procedure:             Colonoscopy Indications:           Positive Cologuard test Providers:             Thaila Bottoms B. Bonna Gains MD, MD Referring MD:          Myna Hidalgo. Reece Packer, FNP Medicines:             Monitored Anesthesia Care Complications:         No immediate complications. Procedure:             Pre-Anesthesia Assessment:                        - ASA Grade Assessment: II - A patient with mild                         systemic disease.                        - Prior to the procedure, a History and Physical was                         performed, and patient medications, allergies and                         sensitivities were reviewed. The patient's tolerance                         of previous anesthesia was reviewed.                        - The risks and benefits of the procedure and the                         sedation options and risks were discussed with the                         patient. All questions were answered and informed                         consent was obtained.                        - Patient identification and proposed procedure were                         verified prior to the procedure by the physician, the                         nurse, the anesthesiologist, the anesthetist and the                         technician. The procedure was verified in the                         procedure  room.                        After obtaining informed consent, the colonoscope was                         passed under direct vision. Throughout the procedure,                         the patient's blood pressure, pulse, and oxygen                         saturations were  monitored continuously. The                         Colonoscope was introduced through the anus and                         advanced to the the cecum, identified by appendiceal                         orifice and ileocecal valve. The colonoscopy was                         performed with ease. The patient tolerated the                         procedure well. The quality of the bowel preparation                         was good. Findings:      The perianal and digital rectal examinations were normal.      Two sessile polyps were found in the ascending colon. The polyps were 6       to 10 mm in size. These polyps were removed with a cold snare. Resection       and retrieval were complete.      A 4 mm polyp was found in the ascending colon. The polyp was sessile.       The polyp was removed with a jumbo cold forceps. Resection and retrieval       were complete.      Two sessile polyps were found in the descending colon. The polyps were 3       to 4 mm in size. These polyps were removed with a jumbo cold forceps.       Resection and retrieval were complete.      Four sessile polyps were found in the descending colon. The polyps were       5 to 7 mm in size. These polyps were removed with a cold snare.       Resection and retrieval were complete.      A 4 mm polyp was found in the sigmoid colon. The polyp was flat. The       polyp was removed with a jumbo cold forceps. Resection and retrieval       were complete.      Multiple diverticula were found in the sigmoid colon.      The exam was otherwise without abnormality.      The rectum, sigmoid colon, descending colon, transverse colon, ascending       colon  and cecum appeared normal.      The retroflexed view of the distal rectum and anal verge was normal and       showed no anal or rectal abnormalities. Impression:            - Two 6 to 10 mm polyps in the ascending colon,                         removed with a cold snare. Resected and  retrieved.                        - One 4 mm polyp in the ascending colon, removed with                         a jumbo cold forceps. Resected and retrieved.                        - Two 3 to 4 mm polyps in the descending colon,                         removed with a jumbo cold forceps. Resected and                         retrieved.                        - Four 5 to 7 mm polyps in the descending colon,                         removed with a cold snare. Resected and retrieved.                        - One 4 mm polyp in the sigmoid colon, removed with a                         jumbo cold forceps. Resected and retrieved.                        - Diverticulosis in the sigmoid colon.                        - The examination was otherwise normal.                        - The rectum, sigmoid colon, descending colon,                         transverse colon, ascending colon and cecum are normal.                        - The distal rectum and anal verge are normal on                         retroflexion view. Recommendation:        - Discharge patient to home (with escort).                        - High fiber diet.                        -  Advance diet as tolerated.                        - Continue present medications.                        - Await pathology results.                        - Repeat colonoscopy date to be determined after                         pending pathology results are reviewed.                        - The findings and recommendations were discussed with                         the patient.                        - The findings and recommendations were discussed with                         the patient's family.                        - Return to primary care physician as previously                         scheduled. Procedure Code(s):     --- Professional ---                        [phone], Colonoscopy, flexible; with removal of                         tumor(s),  polyp(s), or other lesion(s) by snare                         technique                        [id number], 71, Colonoscopy, flexible; with biopsy, single                         or multiple Diagnosis Code(s):     --- Professional ---                        K63.5, Polyp of colon                        R19.5, Other fecal abnormalities CPT copyright 2019 American Medical Association. All rights reserved. The codes documented in this report are preliminary and upon coder review may  be revised to meet current compliance requirements.  [name], MD Margretta Sidle B. Bonna Gains MD, MD 10/31/2021 11:10:17 AM This report has been signed electronically. Number of Addenda: 0 Note Initiated On: 10/31/2021 9:57 AM Scope Withdrawal Time: 0 hours 36 minutes 37 seconds  Total Procedure Duration: 0 hours 43 minutes 16 seconds  Estimated Blood Loss:  Estimated blood loss: none.      [facility]

## 2021-10-31 NOTE — Anesthesia Procedure Notes (Signed)
Procedure Name: General with mask airway Date/Time: 10/31/2021 10:09 AM Performed by: Kelton Pillar, CRNA Pre-anesthesia Checklist: Patient identified, Emergency Drugs available, Suction available and Patient being monitored Patient Re-evaluated:Patient Re-evaluated prior to induction Oxygen Delivery Method: Simple face mask Induction Type: IV induction Placement Confirmation: positive ETCO2, CO2 detector and breath sounds checked- equal and bilateral Dental Injury: Teeth and Oropharynx as per pre-operative assessment

## 2021-10-31 NOTE — Anesthesia Preprocedure Evaluation (Signed)
Anesthesia Evaluation  Patient identified by MRN, date of birth, ID band Patient awake    Reviewed: Allergy & Precautions, NPO status , Patient's Chart, lab work & pertinent test results, reviewed documented beta blocker date and time   Airway Mallampati: II  TM Distance: >3 FB Neck ROM: Full    Dental  (+) Edentulous Upper, Edentulous Lower, Lower Dentures, Upper Dentures   Pulmonary COPD, former smoker,    Pulmonary exam normal        Cardiovascular hypertension, Pt. on medications and Pt. on home beta blockers + CAD, + Past MI, + CABG and +CHF  Normal cardiovascular exam+ Valvular Problems/Murmurs AI and MR      Neuro/Psych negative neurological ROS  negative psych ROS   GI/Hepatic negative GI ROS, Neg liver ROS, Bowel prep,  Endo/Other  negative endocrine ROS  Renal/GU negative Renal ROS  negative genitourinary   Musculoskeletal  (+) Arthritis , Osteoarthritis,    Abdominal   Peds negative pediatric ROS (+)  Hematology negative hematology ROS (+)   Anesthesia Other Findings Arthritis    Cancer (HCC)    CHF (congestive heart failure) (HCC) Hypertension    Myocardial infarction [facility])   Prostate cancer ([place])   02/09/17  Left ventricle: Systolic function was severely reduced. The  estimated ejection fraction was in the range of 20% to 25%.    05/01/21 EF 35   Akinesis of the lateral myocardium. Hypokinesis of the  anteroseptal myocardium. Hypokinesis of the anterior myocardium.  Hypokinesis of the apical myocardium.  - Aortic valve: There was mild regurgitation. Valve area (Vmax):  2.03 cm^2.  - Mitral valve: There was mild to moderate regurgitation.  - Right ventricle: The cavity size was mildly dilated. Systolic  function was mildly reduced.       Reproductive/Obstetrics negative OB ROS                             Anesthesia Physical Anesthesia Plan  ASA:  4  Anesthesia Plan: General   Post-op Pain Management:    Induction: Intravenous  PONV Risk Score and Plan: 2 and Propofol infusion and TIVA  Airway Management Planned: Natural Airway and Nasal Cannula  Additional Equipment:   Intra-op Plan:   Post-operative Plan:   Informed Consent: I have reviewed the patients History and Physical, chart, labs and discussed the procedure including the risks, benefits and alternatives for the proposed anesthesia with the patient or authorized representative who has indicated his/her understanding and acceptance.       Plan Discussed with: CRNA, Anesthesiologist and Surgeon  Anesthesia Plan Comments:         Anesthesia Quick Evaluation

## 2021-10-31 NOTE — H&P (Signed)
Seth Antigua, MD [address] [place], [place], [place], [address] [address], [place], [place], [place], [address] Phone: [phone]  Fax: [phone]  Primary Care Physician:  Bo Merino, FNP   Pre-Procedure History & Physical: HPI:  Seth Byrd is a 75 y.o. male is here for a colonoscopy.   Past Medical History:  Diagnosis Date   Arthritis    Cancer ([place])    CHF (congestive heart failure) ([place])    Hypertension    Myocardial infarction [facility])    Prostate cancer ([place]) 10/19/2017    Past Surgical History:  Procedure Laterality Date   BACK SURGERY     Spinal Fusion with 2 screws   CORONARY ARTERY BYPASS GRAFT N/A 02/10/2017   Procedure: CORONARY ARTERY BYPASS GRAFTING (CABG), ON PUMP, TIMES THREE, USING LEFT INTERNAL MAMMARY ARTERY AND ENDOSCOPICALLY HARVESTED BILATERAL GREATER SAPHENOUS VEINS;  Surgeon: [name], MD;  Location: [place];  Service: Open Heart Surgery;  Laterality: N/A;  LIMA to LAD SVG to Diag 1 SVG to [place] CATH AND CORONARY ANGIOGRAPHY N/A 02/09/2017   Procedure: Left Heart Cath and Coronary Angiography;  Surgeon: [name], MD;  Location: [place] CV LAB;  Service: Cardiovascular;  Laterality: N/A;   TEE WITHOUT CARDIOVERSION N/A 02/10/2017   Procedure: TRANSESOPHAGEAL ECHOCARDIOGRAM (TEE);  Surgeon: [name], MD;  Location: [place];  Service: Open Heart Surgery;  Laterality: N/A;   TOTAL KNEE ARTHROPLASTY Left 04/23/2015   Procedure: TOTAL KNEE ARTHROPLASTY;  Surgeon: Christophe Louis, MD;  Location: [facility] ORS;  Service: Orthopedics;  Laterality: Left;    Prior to Admission medications   Medication Sig Start Date End Date Taking? Authorizing Provider  acetaminophen (TYLENOL) 500 MG tablet Take 500 mg by mouth every 6 (six) hours as needed.   Yes [provider]  aspirin EC 81 MG tablet Take 81 mg by mouth daily.   Yes [provider]  atorvastatin (LIPITOR) 40 MG  tablet Take 1 tablet (40 mg total) by mouth daily at 6 PM. 02/09/17  Yes Demetrios Loll, MD  carvedilol (COREG) 12.5 MG tablet Take 12.5 mg by mouth 2 (two) times daily with a meal. 05/03/18  Yes [provider]  clindamycin (CLEOCIN T) 1 % external solution  01/25/19  Yes [provider]  clobetasol (TEMOVATE) 0.05 % external solution APPLY TO SCALP DAILY 04/04/18  Yes [provider]  Emollient (CERAVE) CREA Apply topically.   Yes [provider]  ENTRESTO 49-51 MG TK 1 T PO BID 04/21/19  Yes [provider]  furosemide (LASIX) 40 MG tablet Take 1 tablet (40 mg total) by mouth daily. 02/19/17  Yes Barrett, Lodema Hong, PA-C  HUMIRA PEN 40 MG/0.8ML PNKT  11/03/18  Yes [provider]  ketoconazole (NIZORAL) 2 % cream  12/13/17  Yes [provider]  Multiple Vitamins-Minerals (ONE-A-DAY MENS 50+ ADVANTAGE PO) Take 1 tablet by mouth daily.   Yes [provider]  Omega-3 Fatty Acids (FISH OIL) 1200 MG CAPS Take 1 capsule by mouth 2 (two) times daily.    Yes [provider]  spironolactone (ALDACTONE) 25 MG tablet Take 1 tablet (25 mg total) by mouth daily. 02/19/17  Yes Barrett, Erin R, PA-C  vitamin B-12 (CYANOCOBALAMIN) 1000 MCG tablet Take 1,000 mcg by mouth daily.    Yes [provider]    Allergies as of 09/29/2021   (No Known Allergies)    Family  History  Problem Relation Age of Onset   Hypertension Mother    Transient ischemic attack Mother    CVA Mother    Colon cancer Father    Hypertension Father    Prostate cancer Father    Diabetes Brother    Kidney cancer Neg Hx    Bladder Cancer Neg Hx     Social History   Socioeconomic History   Marital status: Divorced    Spouse name: Not on file   Number of children: 2   Years of education: 12   Highest education level: Associate degree: academic program  Occupational History   Not on file  Tobacco Use   Smoking status: Former    Packs/day: 1.50     Years: 35.00    Pack years: 52.50    Types: Cigarettes    Quit date: 04/09/2005    Years since quitting: 16.5   Smokeless tobacco: Never  Vaping Use   Vaping Use: Never used  Substance and Sexual Activity   Alcohol use: No   Drug use: No   Sexual activity: Yes    Birth control/protection: None  Other Topics Concern   Not on file  Social History Narrative   Not on file   Social Determinants of Health   Financial Resource Strain: Low Risk    Difficulty of Paying Living Expenses: Not hard at all  Food Insecurity: No Food Insecurity   Worried About Charity fundraiser in the Last Year: Never true   [place] in the Last Year: Never true  Transportation Needs: No Transportation Needs   Lack of Transportation (Medical): No   Lack of Transportation (Non-Medical): No  Physical Activity: Sufficiently Active   Days of Exercise per Week: 5 days   Minutes of Exercise per Session: 30 min  Stress: No Stress Concern Present   Feeling of Stress : Only a little  Social Connections: Moderately Isolated   Frequency of Communication with Friends and Family: Once a week   Frequency of Social Gatherings with Friends and Family: Once a week   Attends Religious Services: More than 4 times per year   Active Member of [employer] or Organizations: Yes   Attends Music therapist: More than 4 times per year   Marital Status: Divorced  Human resources officer Violence: Not At Risk   Fear of Current or Ex-Partner: No   Emotionally Abused: No   Physically Abused: No   Sexually Abused: No    Review of Systems: See HPI, otherwise negative ROS  Physical Exam: Constitutional: General:   Alert,  Well-developed, well-nourished, pleasant and cooperative in NAD BP (!) 145/81    Pulse 71    Temp (!) 96.5 F (35.8 C) (Temporal)    Resp 20    Ht 5' 10.5" (1.791 m)    Wt 78.5 kg    SpO2 98%    BMI 24.47 kg/m   Head: Normocephalic, atraumatic.   Eyes:  Sclera clear, no icterus.   Conjunctiva pink.    Mouth:  No deformity or lesions, oropharynx pink & moist.  Neck:  Supple, trachea midline  Respiratory: Normal respiratory effort  Gastrointestinal:  Soft, non-tender and non-distended without masses, hepatosplenomegaly or hernias noted.  No guarding or rebound tenderness.     Cardiac: No clubbing or edema.  No cyanosis. Normal posterior tibial pedal pulses noted.  Lymphatic:  No significant cervical adenopathy.  Psych:  Alert and cooperative. Normal mood and affect.  Musculoskeletal:   Symmetrical  without gross deformities. 5/5 Lower extremity strength bilaterally.  Skin: Warm. Intact without significant lesions or rashes. No jaundice.  Neurologic:  Face symmetrical, tongue midline, Normal sensation to touch;  grossly normal neurologically.  Psych:  Alert and oriented x3, Alert and cooperative. Normal mood and affect.  Impression/Plan: Cornelious Bryant is here for a colonoscopy to be performed for positive cologuard test. 2014 colonoscopy with Dr. Allen Norris available in provation, and 5 subcentimeter polyps were removed and repeat was recommended in 5 yrs. Indication at that time was history of polyps. Pathology report not available.  Risks, benefits, limitations, and alternatives regarding  colonoscopy have been reviewed with the patient.  Questions have been answered.  All parties agreeable.   Virgel Manifold, MD  10/31/2021, 10:01 AM

## 2021-11-04 LAB — SURGICAL PATHOLOGY

## 2021-11-05 ENCOUNTER — Encounter: Payer: Self-pay | Admitting: Gastroenterology

## 2021-12-22 ENCOUNTER — Other Ambulatory Visit: Payer: Managed Care, Other (non HMO)

## 2021-12-23 ENCOUNTER — Other Ambulatory Visit: Payer: Self-pay

## 2021-12-23 ENCOUNTER — Other Ambulatory Visit: Payer: Managed Care, Other (non HMO)

## 2021-12-23 DIAGNOSIS — C61 Malignant neoplasm of prostate: Secondary | ICD-10-CM

## 2021-12-24 LAB — PSA: Prostate Specific Ag, Serum: 5.3 ng/mL — ABNORMAL HIGH (ref 0.0–4.0)

## 2021-12-26 ENCOUNTER — Encounter: Payer: Self-pay | Admitting: Urology

## 2021-12-26 ENCOUNTER — Ambulatory Visit: Payer: Managed Care, Other (non HMO) | Admitting: Urology

## 2021-12-26 ENCOUNTER — Other Ambulatory Visit: Payer: Self-pay

## 2021-12-26 VITALS — BP 154/71 | HR 71 | Ht 70.0 in | Wt 175.0 lb

## 2021-12-26 DIAGNOSIS — C61 Malignant neoplasm of prostate: Secondary | ICD-10-CM

## 2021-12-26 NOTE — Progress Notes (Signed)
12/26/2021 11:06 AM   Seth Byrd 1945/12/30 [id number]  Referring provider: Delsa Grana, PA-C [address] [place],  [place] [address]  Chief Complaint  Patient presents with   Prostate Cancer    Urologic history: 1. 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 PSA 4.8, volume 36 g                         elected active surveillance.   Confirmatory biopsy December 2018 1/12 core positive Gleason 3+3 right base involving 1%   HPI: 76 y.o. male presents for semiannual follow-up of prostate cancer.  No problems since last visit No bothersome LUTS Denies dysuria, gross hematuria Denies flank, abdominal or pelvic pain PSA 12/23/2021 with slight bump 5.3   PMH: Past Medical History:  Diagnosis Date   Arthritis    Cancer ([place])    CHF (congestive heart failure) ([place])    Hypertension    Myocardial infarction [facility])    Prostate cancer ([place]) 10/19/2017    Surgical History: Past Surgical History:  Procedure Laterality Date   BACK SURGERY     Spinal Fusion with 2 screws   COLONOSCOPY WITH PROPOFOL N/A 10/31/2021   Procedure: COLONOSCOPY WITH PROPOFOL;  Surgeon: Virgel Manifold, MD;  Location: [facility] ENDOSCOPY;  Service: Endoscopy;  Laterality: N/A;   CORONARY ARTERY BYPASS GRAFT N/A 02/10/2017   Procedure: CORONARY ARTERY BYPASS GRAFTING (CABG), ON PUMP, TIMES THREE, USING LEFT INTERNAL MAMMARY ARTERY AND ENDOSCOPICALLY HARVESTED BILATERAL GREATER SAPHENOUS VEINS;  Surgeon: [name], MD;  Location: [place];  Service: Open Heart Surgery;  Laterality: N/A;  LIMA to LAD SVG to Diag 1 SVG to [place] CATH AND CORONARY ANGIOGRAPHY N/A 02/09/2017   Procedure: Left Heart Cath and Coronary Angiography;  Surgeon: [name], MD;  Location: [place] CV LAB;  Service: Cardiovascular;  Laterality: N/A;   TEE WITHOUT CARDIOVERSION N/A 02/10/2017    Procedure: TRANSESOPHAGEAL ECHOCARDIOGRAM (TEE);  Surgeon: [name], MD;  Location: [place];  Service: Open Heart Surgery;  Laterality: N/A;   TOTAL KNEE ARTHROPLASTY Left 04/23/2015   Procedure: TOTAL KNEE ARTHROPLASTY;  Surgeon: Christophe Louis, MD;  Location: [facility] ORS;  Service: Orthopedics;  Laterality: Left;    Home Medications:  Allergies as of 12/26/2021   No Known Allergies      Medication List        Accurate as of December 26, 2021 11:06 AM. If you have any questions, ask your nurse or doctor.          acetaminophen 500 MG tablet Commonly known as: TYLENOL Take 500 mg by mouth every 6 (six) hours as needed.   aspirin EC 81 MG tablet Take 81 mg by mouth daily.   atorvastatin 40 MG tablet Commonly known as: LIPITOR Take 1 tablet (40 mg total) by mouth daily at 6 PM.   carvedilol 12.5 MG tablet Commonly known as: COREG Take 12.5 mg by mouth 2 (two) times daily with a meal.   CeraVe Crea Apply topically.   clindamycin 1 % external solution Commonly known as: CLEOCIN T   clobetasol 0.05 % external solution Commonly known as: TEMOVATE APPLY TO SCALP DAILY   Entresto 49-51 MG Generic drug: sacubitril-valsartan TK 1 T PO BID   Fish Oil 1200 MG Caps Take 1 capsule by mouth 2 (two) times  daily.   furosemide 40 MG tablet Commonly known as: LASIX Take 1 tablet (40 mg total) by mouth daily.   Humira Pen 40 MG/0.8ML Pnkt Generic drug: Adalimumab   ketoconazole 2 % cream Commonly known as: NIZORAL   ONE-A-DAY MENS 50+ ADVANTAGE PO Take 1 tablet by mouth daily.   spironolactone 25 MG tablet Commonly known as: ALDACTONE Take 1 tablet (25 mg total) by mouth daily.   vitamin B-12 1000 MCG tablet Commonly known as: CYANOCOBALAMIN Take 1,000 mcg by mouth daily.        Allergies: No Known Allergies  Family History: Family History  Problem Relation Age of Onset   Hypertension Mother    Transient ischemic attack Mother    CVA Mother     Colon cancer Father    Hypertension Father    Prostate cancer Father    Diabetes Brother    Kidney cancer Neg Hx    Bladder Cancer Neg Hx     Social History:  reports that he quit smoking about 16 years ago. His smoking use included cigarettes. He has a 52.50 pack-year smoking history. He has never used smokeless tobacco. He reports that he does not drink alcohol and does not use drugs.   Physical Exam: BP (!) 154/71    Pulse 71    Ht 5\' 10"  (1.778 m)    Wt 175 lb (79.4 kg)    BMI 25.11 kg/m   Constitutional:  Alert and oriented, No acute distress. HEENT: [place] AT, moist mucus membranes.  Trachea midline, no masses. Cardiovascular: No clubbing, cyanosis, or edema. Respiratory: Normal respiratory effort, no increased work of breathing. GU: Prostate 40 g, smooth without nodules Neurologic: Grossly intact, no focal deficits, moving all 4 extremities. Psychiatric: Normal mood and affect.   Assessment & Plan:    1.  T1c low risk prostate cancer Slight PSA bump Benign DRE Repeat PSA 8 weeks and if still elevated above baseline prostate MRI   Abbie Sons, MD  [facility] [address], [place], [place] [address] [phone]

## 2022-02-23 ENCOUNTER — Other Ambulatory Visit: Payer: Managed Care, Other (non HMO)

## 2022-02-23 DIAGNOSIS — C61 Malignant neoplasm of prostate: Secondary | ICD-10-CM

## 2022-02-24 LAB — PSA: Prostate Specific Ag, Serum: 5.6 ng/mL — ABNORMAL HIGH (ref 0.0–4.0)

## 2022-02-25 ENCOUNTER — Telehealth: Payer: Self-pay | Admitting: *Deleted

## 2022-02-25 ENCOUNTER — Other Ambulatory Visit: Payer: Self-pay | Admitting: Urology

## 2022-02-25 DIAGNOSIS — C61 Malignant neoplasm of prostate: Secondary | ICD-10-CM

## 2022-02-25 NOTE — Telephone Encounter (Signed)
Spoke with patient and he will look for the call from the schedulers for the MRI ?

## 2022-02-25 NOTE — Telephone Encounter (Signed)
-----   Message from Abbie Sons, MD sent at 02/25/2022 12:21 PM EDT ----- ?Repeat PSA level remains elevated above baseline at 5.6.  Recommend scheduling prostate MRI.  Order entered and will call with results. ?

## 2022-04-08 ENCOUNTER — Ambulatory Visit
Admission: RE | Admit: 2022-04-08 | Discharge: 2022-04-08 | Disposition: A | Discharge status: Home / Self Care | Payer: Managed Care, Other (non HMO) | Source: Ambulatory Visit | Attending: Urology | Admitting: Urology

## 2022-04-08 DIAGNOSIS — C61 Malignant neoplasm of prostate: Secondary | ICD-10-CM | POA: Insufficient documentation

## 2022-04-08 MED ORDER — GADOBUTROL 1 MMOL/ML IV SOLN
7.5000 mL | Freq: Once | INTRAVENOUS | Status: AC | PRN
  Administered 2022-04-08: 7.5 mL via INTRAVENOUS

## 2022-04-10 ENCOUNTER — Encounter: Payer: Self-pay | Admitting: Urology

## 2022-04-14 NOTE — Telephone Encounter (Signed)
Patient called in regards to MRI results. Result message sent via mychart was read to patient. He states that he will wait until august and have his PSA recheck then, rather than go ahead with a biopsy now. Appointment was previously made and confirmed with patient

## 2022-05-14 IMAGING — CT CT CHEST LUNG CANCER SCREENING LOW DOSE W/O CM
2 of 5 series · 15 of 40 positions shown, 18 images · non-contrast
Comparison: 09/22/2019

CLINICAL DATA: 74-year-old male with 53 pack-year history of
smoking. Lung cancer screening.

EXAM:
CT CHEST WITHOUT CONTRAST LOW-DOSE FOR LUNG CANCER SCREENING
TECHNIQUE: Multidetector CT imaging of the chest was performed following the
standard protocol without IV contrast.

[Series 4: lung 1.00 · axial · 0.63mm/px · z∈[-1327,-970]mm · 12 of 393 slices shown, 15 images]
[im 18/393  mediastinal]
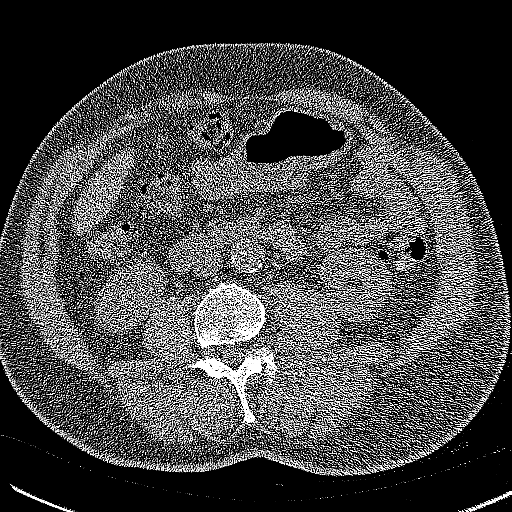
[im 18/393  lung]
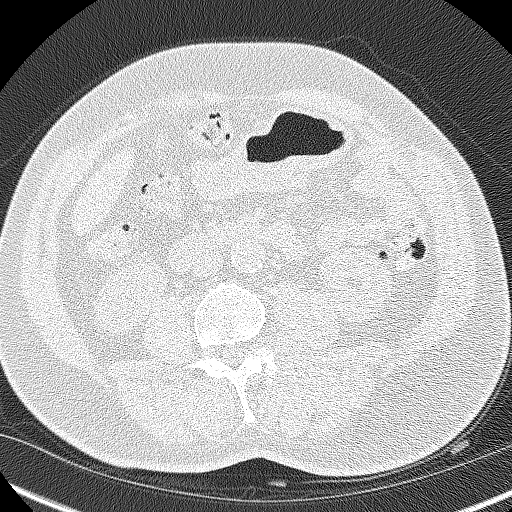
[im 54/393  lung]
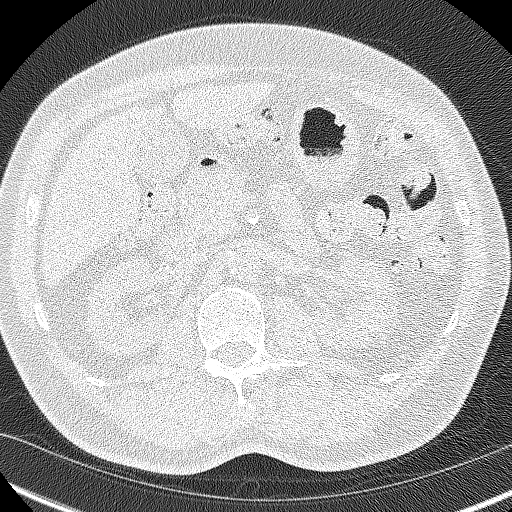
[im 90/393  lung]
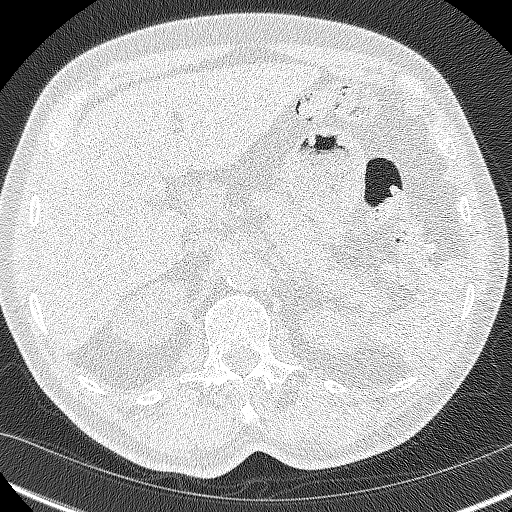
[im 125/393  lung]
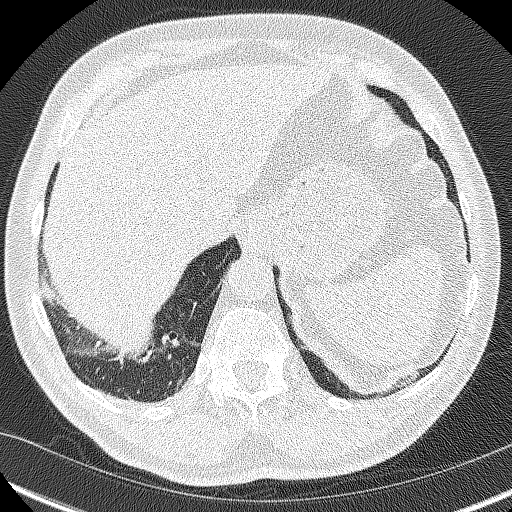
[im 143/393  mediastinal]
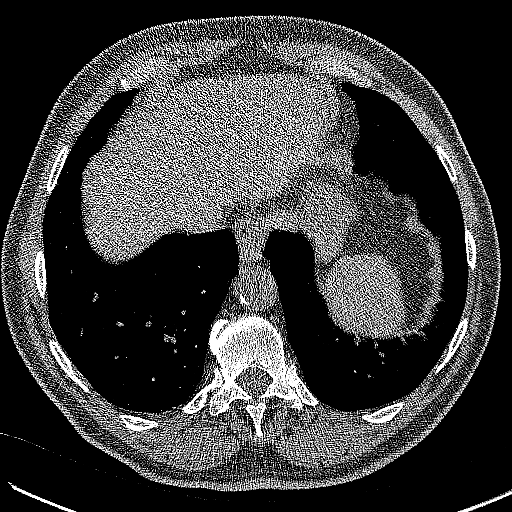
[im 143/393  lung]
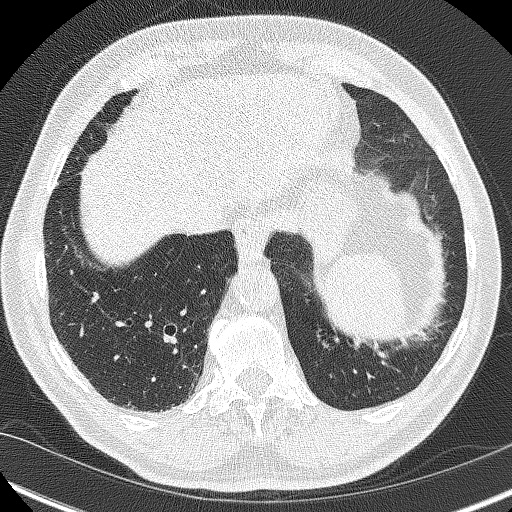
[im 179/393  lung]
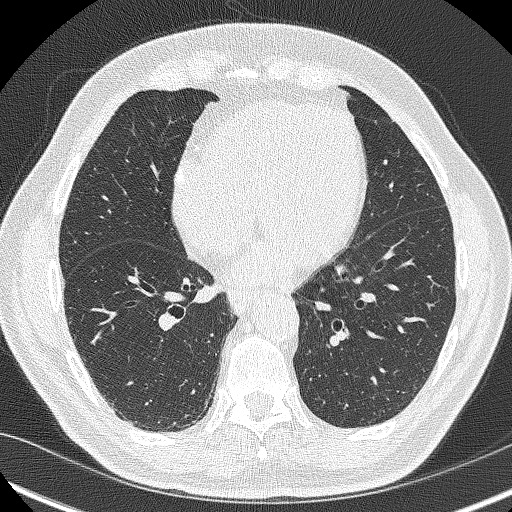
[im 214/393  lung]
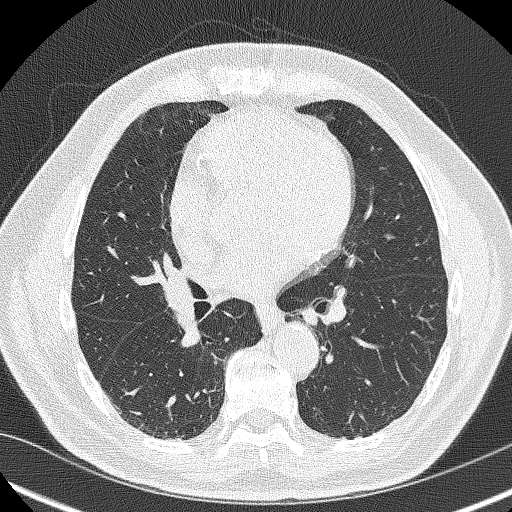
[im 250/393  lung]
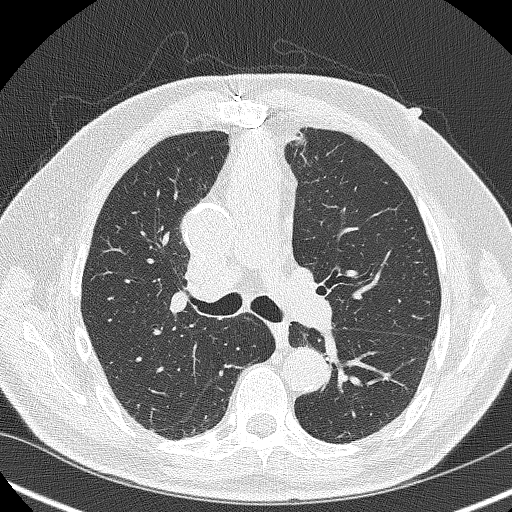
[im 268/393  mediastinal]
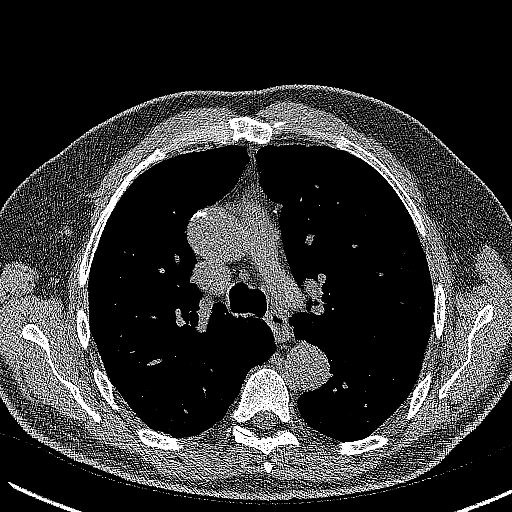
[im 268/393  lung]
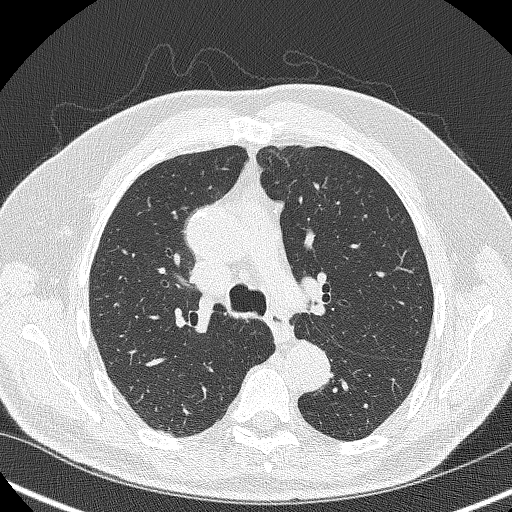
[im 303/393  lung]
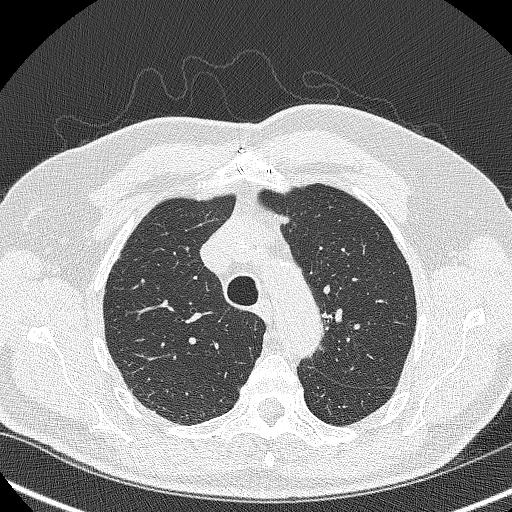
[im 339/393  lung]
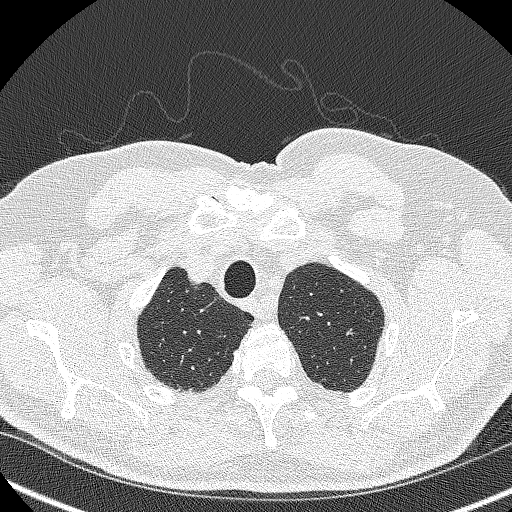
[im 375/393  lung]
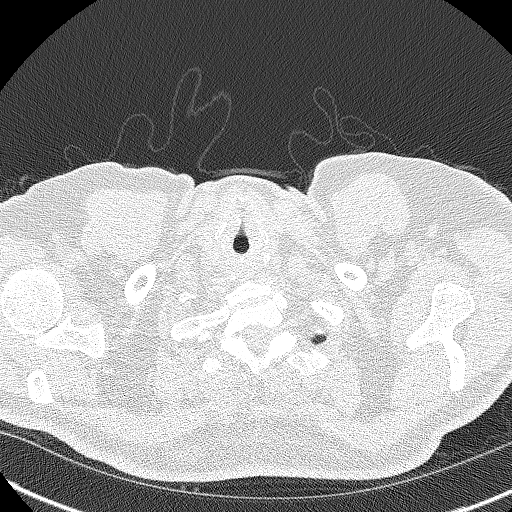

[Series 5: coronals lung 1.00 cor · coronal · 0.63mm/px · 3 of 304 slices shown]
[im 61/304  lung]
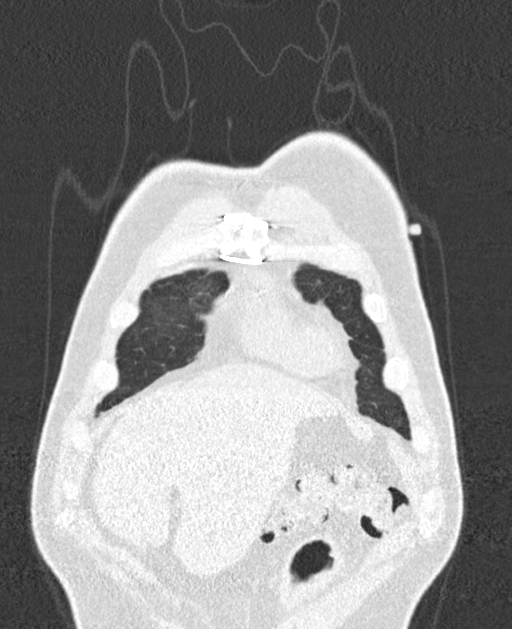
[im 122/304  lung]
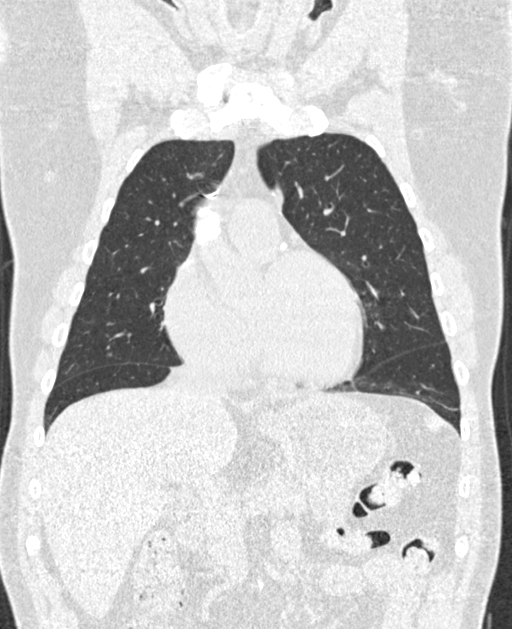
[im 182/304  lung]
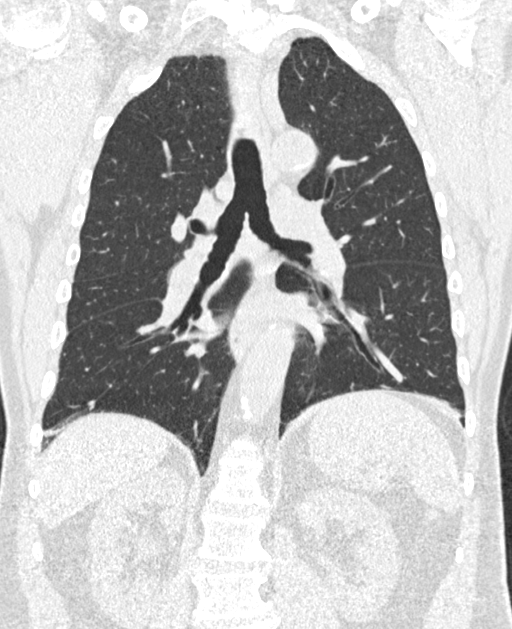

[15 of 40 positions shown; findings below may reference images not displayed]

FINDINGS: Cardiovascular: The heart size is normal. No substantial pericardial
effusion. Coronary artery calcification is evident. Atherosclerotic
calcification is noted in the wall of the thoracic aorta.

Mediastinum/Nodes: No mediastinal lymphadenopathy. No evidence for
gross hilar lymphadenopathy although assessment is limited by the
lack of intravenous contrast on today's study. The esophagus has
normal imaging features. There is no axillary lymphadenopathy.

Lungs/Pleura: Centrilobular emphsyema noted. Previously identified
tiny pulmonary nodules are stable in the interval. No new suspicious
pulmonary nodule or mass. No focal airspace consolidation. No
pleural effusion.

Upper Abdomen: Unremarkable.

Musculoskeletal: No worrisome lytic or sclerotic osseous
abnormality.
IMPRESSION: 1. Lung-RADS 2, benign appearance or behavior. Continue annual
screening with low-dose chest CT without contrast in 12 months.
2.  Emphysema (7FN11-TME.9) and Aortic Atherosclerosis (7FN11-170.0)

## 2022-06-25 ENCOUNTER — Other Ambulatory Visit: Payer: Managed Care, Other (non HMO)

## 2022-06-25 ENCOUNTER — Other Ambulatory Visit: Payer: Self-pay | Admitting: *Deleted

## 2022-06-25 DIAGNOSIS — C61 Malignant neoplasm of prostate: Secondary | ICD-10-CM

## 2022-06-26 ENCOUNTER — Ambulatory Visit: Payer: Managed Care, Other (non HMO) | Admitting: Urology

## 2022-06-26 ENCOUNTER — Telehealth: Payer: Self-pay | Admitting: Urology

## 2022-06-26 LAB — PSA: Prostate Specific Ag, Serum: 6 ng/mL — ABNORMAL HIGH (ref 0.0–4.0)

## 2022-06-26 NOTE — Telephone Encounter (Signed)
PSA has increased to 6.0.  He had indicated in June he want to hold off on prostate biopsy repeat his PSA in August.  Since his PSA is increasing would recommend scheduling biopsy.  If he desires to schedule we will put in referral.  He has a follow-up appointment next month and if he wants to hold off we can discuss further at that visit.

## 2022-06-29 ENCOUNTER — Ambulatory Visit: Payer: Managed Care, Other (non HMO) | Admitting: Urology

## 2022-07-01 NOTE — Telephone Encounter (Signed)
Patient notified and wants to come in to discuss options first. Appointment has been changed.

## 2022-07-15 ENCOUNTER — Ambulatory Visit: Payer: Managed Care, Other (non HMO) | Admitting: Urology

## 2022-07-15 ENCOUNTER — Encounter: Payer: Self-pay | Admitting: Urology

## 2022-07-15 VITALS — BP 152/68 | HR 66 | Ht 71.0 in | Wt 178.0 lb

## 2022-07-15 DIAGNOSIS — C61 Malignant neoplasm of prostate: Secondary | ICD-10-CM

## 2022-07-15 DIAGNOSIS — Z8546 Personal history of malignant neoplasm of prostate: Secondary | ICD-10-CM

## 2022-07-15 DIAGNOSIS — R9721 Rising PSA following treatment for malignant neoplasm of prostate: Secondary | ICD-10-CM

## 2022-07-15 NOTE — Progress Notes (Signed)
07/15/2022 8:05 AM   Seth Byrd [birth year] [id number]  Referring provider: Bo Merino, [place] [address] [place],  [place] [address]  Chief Complaint  Patient presents with   Elevated PSA    Urologic history: 1. 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 PSA 4.8, volume 36 g                         elected active surveillance.   Confirmatory biopsy December 2018 1/12 core positive Gleason 3+3 right base involving 1%   HPI: 76 y.o. male presents for semiannual follow-up of prostate cancer.  He presents today with his daughter, Lattie Haw.  No problems since last visit No bothersome LUTS Denies dysuria, gross hematuria Denies flank, abdominal or pelvic pain PSA 12/23/2021 with slight bump 5.3; repeat 02/23/2022 was 5.6.  MRI performed 04/08/2022 which showed a PI-RADS 3 lesion right anterior mid gland and prostate volume 37 cc. He elected not to pursue MR fusion biopsy PSA 06/25/2022 has increased to 6.0   PMH: Past Medical History:  Diagnosis Date   Arthritis    Cancer [facility])    CHF (congestive heart failure) ([place])    Hypertension    Myocardial infarction [facility])    Prostate cancer ([place]) 10/19/2017    Surgical History: Past Surgical History:  Procedure Laterality Date   BACK SURGERY     Spinal Fusion with 2 screws   COLONOSCOPY WITH PROPOFOL N/A 10/31/2021   Procedure: COLONOSCOPY WITH PROPOFOL;  Surgeon: Virgel Manifold, MD;  Location: [facility] ENDOSCOPY;  Service: Endoscopy;  Laterality: N/A;   CORONARY ARTERY BYPASS GRAFT N/A 02/10/2017   Procedure: CORONARY ARTERY BYPASS GRAFTING (CABG), ON PUMP, TIMES THREE, USING LEFT INTERNAL MAMMARY ARTERY AND ENDOSCOPICALLY HARVESTED BILATERAL GREATER SAPHENOUS VEINS;  Surgeon: [name], MD;  Location: [facility];  Service: Open Heart Surgery;  Laterality: N/A;  LIMA to LAD SVG to Diag 1 SVG to [place] CATH AND CORONARY ANGIOGRAPHY N/A 02/09/2017   Procedure: Left Heart Cath and Coronary Angiography;  Surgeon: [name], MD;  Location: [facility] CV LAB;  Service: Cardiovascular;  Laterality: N/A;   TEE WITHOUT CARDIOVERSION N/A 02/10/2017   Procedure: TRANSESOPHAGEAL ECHOCARDIOGRAM (TEE);  Surgeon: [name], MD;  Location: [facility];  Service: Open Heart Surgery;  Laterality: N/A;   TOTAL KNEE ARTHROPLASTY Left 04/23/2015   Procedure: TOTAL KNEE ARTHROPLASTY;  Surgeon: Christophe Louis, MD;  Location: [facility] ORS;  Service: Orthopedics;  Laterality: Left;    Home Medications:  Allergies as of 07/15/2022   No Known Allergies      Medication List        Accurate as of July 15, 2022  8:05 AM. If you have any questions, ask your nurse or doctor.          acetaminophen 500 MG tablet Commonly known as: TYLENOL Take 500 mg by mouth every 6 (six) hours as needed.   aspirin EC 81 MG tablet Take 81 mg by mouth daily.   atorvastatin 40 MG tablet Commonly known as: LIPITOR Take 1 tablet (40 mg total) by mouth daily at 6 PM.   carvedilol 12.5 MG tablet Commonly known as: COREG Take 12.5 mg by mouth 2 (two) times daily with a meal.   CeraVe Crea Apply topically.   clindamycin 1 % external solution Commonly  known as: CLEOCIN T   clobetasol 0.05 % external solution Commonly known as: TEMOVATE APPLY TO SCALP DAILY   cyanocobalamin 1000 MCG tablet Commonly known as: VITAMIN B12 Take 1,000 mcg by mouth daily.   Entresto 49-51 MG Generic drug: sacubitril-valsartan TK 1 T PO BID   Fish Oil 1200 MG Caps Take 1 capsule by mouth 2 (two) times daily.   furosemide 40 MG tablet Commonly known as: LASIX Take 1 tablet (40 mg total) by mouth daily.   Humira Pen 40 MG/0.8ML Pnkt Generic drug: Adalimumab   ketoconazole 2 % cream Commonly known as: NIZORAL   ONE-A-DAY MENS 50+ ADVANTAGE PO Take 1 tablet by mouth daily.   spironolactone 25 MG  tablet Commonly known as: ALDACTONE Take 1 tablet (25 mg total) by mouth daily.        Allergies: No Known Allergies  Family History: Family History  Problem Relation Age of Onset   Hypertension Mother    Transient ischemic attack Mother    CVA Mother    Colon cancer Father    Hypertension Father    Prostate cancer Father    Diabetes Brother    Kidney cancer Neg Hx    Bladder Cancer Neg Hx     Social History:  reports that he quit smoking about 17 years ago. His smoking use included cigarettes. He has a 52.50 pack-year smoking history. He has never used smokeless tobacco. He reports that he does not drink alcohol and does not use drugs.   Physical Exam: BP (!) 152/68   Pulse 66   Ht '5\' 11"'$  (1.803 m)   Wt 178 lb (80.7 kg)   BMI 24.83 kg/m   Constitutional:  Alert and oriented, No acute distress. HEENT: [place] AT, moist mucus membranes.  Trachea midline, no masses. Cardiovascular: No clubbing, cyanosis, or edema. Respiratory: Normal respiratory effort, no increased work of breathing. GU: Prostate 40 g, smooth without nodules Neurologic: Grossly intact, no focal deficits, moving all 4 extremities. Psychiatric: Normal mood and affect.   Assessment & Plan:    1.  T1c low risk prostate cancer Rising PSA Recent MRI with PI-RADS 3 lesion He has elected to schedule MR fusion biopsy and will schedule at [facility] in [place].  He will follow-up here for the report   Abbie Sons, MD  [facility] [address], [place], [place] [address] [phone]

## 2022-07-24 ENCOUNTER — Ambulatory Visit: Payer: Managed Care, Other (non HMO) | Admitting: Urology

## 2022-08-20 ENCOUNTER — Encounter: Payer: Self-pay | Admitting: Nurse Practitioner

## 2022-09-01 ENCOUNTER — Encounter: Payer: Self-pay | Admitting: Urology

## 2022-09-03 NOTE — Progress Notes (Signed)
Name: Seth Byrd   MRN: [id number]    DOB: 05/10/1946   Date:09/04/2022       Progress Note  Subjective  Chief Complaint  Chief Complaint  Patient presents with   Annual Exam    HPI  Patient presents for annual CPE .  IPSS Questionnaire (AUA-7): Over the past month.   1)  How often have you had a sensation of not emptying your bladder completely after you finish urinating?  1 - Less than 1 time in 5  2)  How often have you had to urinate again less than two hours after you finished urinating? 1 - Less than 1 time in 5  3)  How often have you found you stopped and started again several times when you urinated?  1 - Less than 1 time in 5  4) How difficult have you found it to postpone urination?  1 - Less than 1 time in 5  5) How often have you had a weak urinary stream?  1 - Less than 1 time in 5  6) How often have you had to push or strain to begin urination?  1 - Less than 1 time in 5  7) How many times did you most typically get up to urinate from the time you went to bed until the time you got up in the morning?  0 - None  Total score:  0-7 mildly symptomatic   8-19 moderately symptomatic   20-35 severely symptomatic     Diet: well balanced diet, eats mostly at home Exercise: exercise bike, does most days of the week. Recommendation is to get 150 min of physical activity weekly Sleep: 6-8 hours Last dental exam:it has been awhile Last eye exam: this year  Depression: phq 9 is negative    09/04/2022    8:19 AM 08/29/2021    8:23 AM 06/05/2021    7:46 AM 08/26/2020    8:21 AM 08/25/2019    8:12 AM  Depression screen PHQ 2/9  Decreased Interest 0 0 0 0 0  Down, Depressed, Hopeless 0 0 0 0 0  PHQ - 2 Score 0 0 0 0 0  Altered sleeping     0  Tired, decreased energy     0  Change in appetite     0  Feeling bad or failure about yourself      0  Trouble concentrating     0  Moving slowly or fidgety/restless     0  Suicidal thoughts     0  PHQ-9 Score     0   Difficult doing work/chores     Not difficult at all    Hypertension:  BP Readings from Last 3 Encounters:  09/04/22 130/78  07/15/22 (!) 152/68  12/26/21 (!) 154/71    Obesity: Wt Readings from Last 3 Encounters:  09/04/22 186 lb 14.4 oz (84.8 kg)  07/15/22 178 lb (80.7 kg)  12/26/21 175 lb (79.4 kg)   BMI Readings from Last 3 Encounters:  09/04/22 26.82 kg/m  07/15/22 24.83 kg/m  12/26/21 25.11 kg/m     Lipids:  Lab Results  Component Value Date   CHOL 112 08/29/2021   CHOL 122 08/26/2020   CHOL 106 08/25/2019   Lab Results  Component Value Date   HDL 33 (L) 08/29/2021   HDL 36 (L) 08/26/2020   HDL 35 (L) 08/25/2019   Lab Results  Component Value Date   LDLCALC 61 08/29/2021   LDLCALC 67  08/26/2020   LDLCALC 56 08/25/2019   Lab Results  Component Value Date   TRIG 92 08/29/2021   TRIG 104 08/26/2020   TRIG 73 08/25/2019   Lab Results  Component Value Date   CHOLHDL 3.4 08/29/2021   CHOLHDL 3.4 08/26/2020   CHOLHDL 3.0 08/25/2019   No results found for: "LDLDIRECT" Glucose:  Glucose  Date Value Ref Range Status  08/29/2021 104 (H) 70 - 99 mg/dL Final  08/26/2020 86 65 - 99 mg/dL Final  08/25/2019 91 65 - 99 mg/dL Final   Glucose, Bld  Date Value Ref Range Status  07/17/2018 141 (H) 70 - 99 mg/dL Final  05/03/2017 108 (H) 65 - 99 mg/dL Final  02/25/2017 90 65 - 99 mg/dL Final   Glucose-Capillary  Date Value Ref Range Status  07/17/2018 132 (H) 70 - 99 mg/dL Final  02/17/2017 150 (H) 65 - 99 mg/dL Final  02/17/2017 90 65 - 99 mg/dL Final    Flowsheet Row Office Visit from 09/04/2022 in [facility]  AUDIT-C Score 1        Divorced STD testing and prevention (HIV/chl/gon/syphilis): declined Hep C: 08/13/2017  Skin cancer: Discussed monitoring for atypical lesions Colorectal cancer: 10/31/2021 Prostate cancer: 06/25/2022, currently has prostate cancerDr. Bernardo Heater (urologist) following. T1c adenocarcinoma  prostate- monitoring with PSA No results found for: "PSA"   Lung cancer:   Low Dose CT Chest recommended if Age 4-80 years, 30 pack-year currently smoking OR have quit w/in 15years. Patient doesnot qualify.   AAA:  The USPSTF recommends one-time screening with ultrasonography in men ages 30 to 80 years who have ever smoked ECG:  07/18/2018  Vaccines:  HPV: up to at age 29 , ask insurance if age between 32-45  Shingrix: 31-64 yo and ask insurance if covered when patient above 25 yo Pneumonia:  educated and discussed with patient. Flu:  educated and discussed with patient.  Advanced Care Planning: A voluntary discussion about advance care planning including the explanation and discussion of advance directives.  Discussed health care proxy and Living will, and the patient was able to identify a health care proxy as Arline Asp and Lattie Haw daughter.  Patient does have a living will at present time. If patient does have living will, I have requested they bring this to the clinic to be scanned in to their chart.  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   History of heart attack 02/09/2017   Essential hypertension 11/25/2015   Hyperlipidemia 11/25/2015   Panlobular emphysema ([place]) 11/25/2015   Status post total left knee replacement 11/25/2015   Primary osteoarthritis involving multiple joints 11/25/2015   Primary osteoarthritis of knee 04/23/2015    Past Surgical History:  Procedure Laterality Date   BACK SURGERY     Spinal Fusion with 2 screws   COLONOSCOPY WITH PROPOFOL N/A 10/31/2021   Procedure: COLONOSCOPY WITH PROPOFOL;  Surgeon: Virgel Manifold, MD;   Location: [facility];  Service: Endoscopy;  Laterality: N/A;   CORONARY ARTERY BYPASS GRAFT N/A 02/10/2017   Procedure: CORONARY ARTERY BYPASS GRAFTING (CABG), ON PUMP, TIMES THREE, USING LEFT INTERNAL MAMMARY ARTERY AND ENDOSCOPICALLY HARVESTED BILATERAL GREATER SAPHENOUS VEINS;  Surgeon: [name],  MD;  [facility];  Service: Open Heart Surgery;  Laterality: N/A;  LIMA to LAD SVG to Diag 1 SVG to [place] CATH AND CORONARY ANGIOGRAPHY N/A 02/09/2017   Procedure: Left Heart Cath and Coronary Angiography;  Surgeon: [name], MD;  Location: [facility] CV LAB;  Service: Cardiovascular;  Laterality: N/A;   TEE WITHOUT CARDIOVERSION N/A 02/10/2017   Procedure: TRANSESOPHAGEAL ECHOCARDIOGRAM (TEE);  Surgeon: [name], MD;  Location: [facility];  Service: Open Heart Surgery;  Laterality: N/A;   TOTAL KNEE ARTHROPLASTY Left 04/23/2015   Procedure: TOTAL KNEE ARTHROPLASTY;  Surgeon: Christophe Louis, MD;  Location: [facility];  Service: Orthopedics;  Laterality: Left;    Family History  Problem Relation Age of Onset   Hypertension Mother    Transient ischemic attack Mother    CVA Mother    Colon cancer Father    Hypertension Father    Prostate cancer Father    Diabetes Brother    Kidney cancer Neg Hx    Bladder Cancer Neg Hx     Social History   Socioeconomic History   Marital status: Divorced    Spouse name: Not on file   Number of children: 2   Years of education: 12   Highest education level: Associate degree: academic program  Occupational History   Not on file  Tobacco Use   Smoking status: Former    Packs/day: 1.50    Years: 35.00    Total pack years: 52.50    Types: Cigarettes    Quit date: 04/09/2005    Years since quitting: 17.4   Smokeless tobacco: Never  Vaping Use   Vaping Use: Never used  Substance and Sexual Activity   Alcohol use: No   Drug use: No   Sexual activity: Yes    Birth control/protection: None   Other Topics Concern   Not on file  Social History Narrative   Not on file   Social Determinants of Health   Financial Resource Strain: Low Risk  (09/04/2022)   Overall Financial Resource Strain (CARDIA)    Difficulty of Paying Living Expenses: Not hard at all  Food Insecurity: No Food Insecurity (09/04/2022)   Hunger Vital Sign    Worried About Running Out of Food in the Last Year: Never true    Ran Out of Food in the Last Year: Never true  Transportation Needs: No Transportation Needs (09/04/2022)   PRAPARE - Hydrologist (Medical): No    Lack of Transportation (Non-Medical): No  Physical Activity: Insufficiently Active (09/04/2022)   Exercise Vital Sign    Days of Exercise per Week: 3 days    Minutes of Exercise per Session: 30 min  Stress: No Stress Concern Present (09/04/2022)   [place]    Feeling of Stress : Not at all  Social Connections: Moderately Integrated (09/04/2022)   Social Connection and Isolation Panel [NHANES]    Frequency of Communication with Friends and Family: Three times a week    Frequency of Social Gatherings with Friends and Family: Twice a week    Attends Religious Services: More than 4 times per year    Active Member of [employer] or Organizations: Yes    Attends Archivist Meetings: More than 4 times per year    Marital Status: Divorced  Intimate Partner Violence: Not At Risk (09/04/2022)   Humiliation, Afraid,  Rape, and Kick questionnaire    Fear of Current or Ex-Partner: No    Emotionally Abused: No    Physically Abused: No    Sexually Abused: No     Current Outpatient Medications:    acetaminophen (TYLENOL) 500 MG tablet, Take 500 mg by mouth every 6 (six) hours as needed., Disp: , Rfl:    aspirin EC 81 MG tablet, Take 81 mg by mouth daily., Disp: , Rfl:    atorvastatin (LIPITOR) 40 MG tablet, Take 1 tablet (40 mg total) by mouth daily at  6 PM., Disp: , Rfl:    carvedilol (COREG) 12.5 MG tablet, Take 12.5 mg by mouth 2 (two) times daily with a meal., Disp: , Rfl: 3   clindamycin (CLEOCIN T) 1 % external solution, , Disp: , Rfl:    clobetasol (TEMOVATE) 0.05 % external solution, APPLY TO SCALP DAILY, Disp: , Rfl: 5   Emollient (CERAVE) CREA, Apply topically., Disp: , Rfl:    ENTRESTO 49-51 MG, TK 1 T PO BID, Disp: , Rfl:    furosemide (LASIX) 40 MG tablet, Take 1 tablet (40 mg total) by mouth daily., Disp: 30 tablet, Rfl:    HUMIRA PEN 40 MG/0.8ML PNKT, , Disp: , Rfl:    ketoconazole (NIZORAL) 2 % cream, , Disp: , Rfl:    Multiple Vitamins-Minerals (ONE-A-DAY MENS 50+ ADVANTAGE PO), Take 1 tablet by mouth daily., Disp: , Rfl:    Omega-3 Fatty Acids (FISH OIL) 1200 MG CAPS, Take 1 capsule by mouth 2 (two) times daily. , Disp: , Rfl:    spironolactone (ALDACTONE) 25 MG tablet, Take 1 tablet (25 mg total) by mouth daily., Disp: , Rfl:    vitamin B-12 (CYANOCOBALAMIN) 1000 MCG tablet, Take 1,000 mcg by mouth daily. , Disp: , Rfl:   No Known Allergies   ROS  Constitutional: Negative for fever or weight change.  Respiratory: Negative for cough and shortness of breath.   Cardiovascular: Negative for chest pain or palpitations.  Gastrointestinal: Negative for abdominal pain, no bowel changes.  Musculoskeletal: Negative for gait problem or joint swelling.  Skin: Negative for rash.  Neurological: Negative for dizziness or headache.  No other specific complaints in a complete review of systems (except as listed in HPI above).    Objective  Vitals:   09/04/22 0829  BP: 130/78  Pulse: 72  Resp: 18  Temp: 97.7 F (36.5 C)  TempSrc: Oral  SpO2: 98%  Weight: 186 lb 14.4 oz (84.8 kg)  Height: '5\' 10"'$  (1.778 m)    Body mass index is 26.82 kg/m.  Physical Exam   Constitutional: Patient appears well-developed and well-nourished. No distress.  HENT: Head: Normocephalic and atraumatic. Ears: B TMs ok, no erythema or  effusion; Nose: Nose normal. Mouth/Throat: Oropharynx is clear and moist. No oropharyngeal exudate.  Eyes: Conjunctivae and EOM are normal. Pupils are equal, round, and reactive to light. No scleral icterus.  Neck: Normal range of motion. Neck supple. No JVD present. No thyromegaly present.  Cardiovascular: Normal rate, regular rhythm and normal heart sounds.  No murmur heard. No BLE edema. Pulmonary/Chest: Effort normal and breath sounds normal. No respiratory distress. Abdominal: Soft. Bowel sounds are normal, no distension. There is no tenderness. no masses Musculoskeletal: Normal range of motion, no joint effusions. No gross deformities Neurological: he is alert and oriented to person, place, and time. No cranial nerve deficit. Coordination, balance, strength, speech and gait are normal.  Skin: Skin is warm and dry. No rash noted. No erythema.  Psychiatric: Patient has a normal mood and affect. behavior is normal. Judgment and thought content normal.   Recent Results (from the past 2160 hour(s))  PSA     Status: Abnormal   Collection Time: 06/25/22 10:49 AM  Result Value Ref Range   Prostate Specific Ag, Serum 6.0 (H) 0.0 - 4.0 ng/mL    Comment: Roche ECLIA methodology. According to the American Urological Association, Serum PSA should decrease and remain at undetectable levels after radical prostatectomy. The AUA defines biochemical recurrence as an initial PSA value 0.2 ng/mL or greater followed by a subsequent confirmatory PSA value 0.2 ng/mL or greater. Values obtained with different assay methods or kits cannot be used interchangeably. Results cannot be interpreted as absolute evidence of the presence or absence of malignant disease.      Fall Risk:    09/04/2022    8:19 AM 08/29/2021    8:22 AM 06/05/2021    7:46 AM 08/26/2020    8:20 AM 08/25/2019    8:13 AM  Fall Risk   Falls in the past year? 0 0 0 0 0  Number falls in past yr: 0 0 0 0 0  Injury with Fall? 0 0 0 0  0  Follow up Falls evaluation completed Falls evaluation completed Falls evaluation completed Falls evaluation completed       Functional Status Survey: Is the patient deaf or have difficulty hearing?: No Does the patient have difficulty seeing, even when wearing glasses/contacts?: No Does the patient have difficulty concentrating, remembering, or making decisions?: No Does the patient have difficulty walking or climbing stairs?: No Does the patient have difficulty dressing or bathing?: No Does the patient have difficulty doing errands alone such as visiting a doctor's office or shopping?: No    Assessment & Plan  1. Annual physical exam  - Comprehensive metabolic panel - CBC with Differential/Platelet - Lipid panel - Hemoglobin A1c  2. Prostate cancer [facility]) Sees urology  3. Rheumatoid arthritis involving multiple sites, unspecified whether rheumatoid factor present [facility]) Doing well Currently taking humira  4. Essential hypertension B/p 130/78 today Continue taking carvedilol 12.5 mg daily, spironolactone 25 mg daily - Comprehensive metabolic panel - CBC with Differential/Platelet  5. Mixed hyperlipidemia Continue taking atorvastatin 40 mg daily - Lipid panel  6. Screening for diabetes mellitus  - Hemoglobin A1c   Patient has not been seen for a follow up appointment in some time.  Patient will need to schedule an appointment to go over his chronic conditions.   -Prostate cancer screening and PSA options (with potential risks and benefits of testing vs not testing) were discussed along with recent recs/guidelines. -USPSTF grade A and B recommendations reviewed with patient; age-appropriate recommendations, preventive care, screening tests, etc discussed and encouraged; healthy living encouraged; see AVS for patient education given to patient -Discussed importance of 150 minutes of physical activity weekly, eat two servings of fish weekly, eat one serving of tree nuts  ( cashews, pistachios, pecans, almonds.Marland Kitchen) every other day, eat 6 servings of fruit/vegetables daily and drink plenty of water and avoid sweet beverages.

## 2022-09-04 ENCOUNTER — Encounter: Payer: Self-pay | Admitting: Nurse Practitioner

## 2022-09-04 ENCOUNTER — Other Ambulatory Visit: Payer: Self-pay

## 2022-09-04 ENCOUNTER — Ambulatory Visit (INDEPENDENT_AMBULATORY_CARE_PROVIDER_SITE_OTHER): Payer: Managed Care, Other (non HMO) | Admitting: Nurse Practitioner

## 2022-09-04 VITALS — BP 130/78 | HR 72 | Temp 97.7°F | Resp 18 | Ht 70.0 in | Wt 186.9 lb

## 2022-09-04 DIAGNOSIS — Z131 Encounter for screening for diabetes mellitus: Secondary | ICD-10-CM

## 2022-09-04 DIAGNOSIS — Z0000 Encounter for general adult medical examination without abnormal findings: Secondary | ICD-10-CM | POA: Diagnosis not present

## 2022-09-04 DIAGNOSIS — C61 Malignant neoplasm of prostate: Secondary | ICD-10-CM | POA: Diagnosis not present

## 2022-09-04 DIAGNOSIS — E782 Mixed hyperlipidemia: Secondary | ICD-10-CM

## 2022-09-04 DIAGNOSIS — M069 Rheumatoid arthritis, unspecified: Secondary | ICD-10-CM | POA: Diagnosis not present

## 2022-09-04 DIAGNOSIS — I10 Essential (primary) hypertension: Secondary | ICD-10-CM

## 2022-09-05 LAB — CBC WITH DIFFERENTIAL/PLATELET
Basophils Absolute: 0 10*3/uL (ref 0.0–0.2)
Basos: 1 %
EOS (ABSOLUTE): 0.1 10*3/uL (ref 0.0–0.4)
Eos: 2 %
Hematocrit: 38.2 % (ref 37.5–51.0)
Hemoglobin: 13.1 g/dL (ref 13.0–17.7)
Immature Grans (Abs): 0 10*3/uL (ref 0.0–0.1)
Immature Granulocytes: 0 %
Lymphocytes Absolute: 3 10*3/uL (ref 0.7–3.1)
Lymphs: 53 %
MCH: 32.8 pg (ref 26.6–33.0)
MCHC: 34.3 g/dL (ref 31.5–35.7)
MCV: 96 fL (ref 79–97)
Monocytes Absolute: 0.5 10*3/uL (ref 0.1–0.9)
Monocytes: 9 %
Neutrophils Absolute: 2 10*3/uL (ref 1.4–7.0)
Neutrophils: 35 %
Platelets: 164 10*3/uL (ref 150–450)
RBC: 3.99 x10E6/uL — ABNORMAL LOW (ref 4.14–5.80)
RDW: 10.8 % — ABNORMAL LOW (ref 11.6–15.4)
WBC: 5.7 10*3/uL (ref 3.4–10.8)

## 2022-09-05 LAB — COMPREHENSIVE METABOLIC PANEL
ALT: 23 IU/L (ref 0–44)
AST: 19 IU/L (ref 0–40)
Albumin/Globulin Ratio: 1.6 (ref 1.2–2.2)
Albumin: 4.5 g/dL (ref 3.8–4.8)
Alkaline Phosphatase: 50 IU/L (ref 44–121)
BUN/Creatinine Ratio: 20 (ref 10–24)
BUN: 21 mg/dL (ref 8–27)
Bilirubin Total: 1.1 mg/dL (ref 0.0–1.2)
CO2: 24 mmol/L (ref 20–29)
Calcium: 10.5 mg/dL — ABNORMAL HIGH (ref 8.6–10.2)
Chloride: 103 mmol/L (ref 96–106)
Creatinine, Ser: 1.06 mg/dL (ref 0.76–1.27)
Globulin, Total: 2.9 g/dL (ref 1.5–4.5)
Glucose: 94 mg/dL (ref 70–99)
Potassium: 4.6 mmol/L (ref 3.5–5.2)
Sodium: 140 mmol/L (ref 134–144)
Total Protein: 7.4 g/dL (ref 6.0–8.5)
eGFR: 73 mL/min/{1.73_m2} (ref >59)

## 2022-09-05 LAB — LIPID PANEL
Chol/HDL Ratio: 2.9 ratio (ref 0.0–5.0)
Cholesterol, Total: 115 mg/dL (ref 100–199)
HDL: 40 mg/dL (ref >39)
LDL Chol Calc (NIH): 58 mg/dL (ref 0–99)
Triglycerides: 86 mg/dL (ref 0–149)
VLDL Cholesterol Cal: 17 mg/dL (ref 5–40)

## 2022-09-05 LAB — HEMOGLOBIN A1C
Est. average glucose Bld gHb Est-mCnc: 111 mg/dL
Hgb A1c MFr Bld: 5.5 % (ref 4.8–5.6)

## 2022-09-21 ENCOUNTER — Encounter: Payer: Self-pay | Admitting: Urology

## 2023-01-20 ENCOUNTER — Other Ambulatory Visit: Payer: Self-pay

## 2023-01-20 DIAGNOSIS — C61 Malignant neoplasm of prostate: Secondary | ICD-10-CM

## 2023-01-21 ENCOUNTER — Other Ambulatory Visit: Payer: Managed Care, Other (non HMO)

## 2023-01-21 DIAGNOSIS — C61 Malignant neoplasm of prostate: Secondary | ICD-10-CM

## 2023-01-22 LAB — PSA: Prostate Specific Ag, Serum: 7.1 ng/mL — ABNORMAL HIGH (ref 0.0–4.0)

## 2023-01-27 ENCOUNTER — Ambulatory Visit: Payer: Managed Care, Other (non HMO) | Admitting: Urology

## 2023-01-28 ENCOUNTER — Ambulatory Visit: Payer: Managed Care, Other (non HMO) | Admitting: Urology

## 2023-01-29 ENCOUNTER — Encounter: Payer: Self-pay | Admitting: Urology

## 2023-01-29 ENCOUNTER — Ambulatory Visit: Payer: Managed Care, Other (non HMO) | Admitting: Urology

## 2023-01-29 VITALS — BP 124/65 | HR 70 | Ht 70.0 in | Wt 175.0 lb

## 2023-01-29 DIAGNOSIS — C61 Malignant neoplasm of prostate: Secondary | ICD-10-CM | POA: Diagnosis not present

## 2023-01-29 NOTE — Progress Notes (Signed)
I, [name],acting as a scribe for Abbie Sons, MD.,have documented all relevant documentation on the behalf of Abbie Sons, MD,as directed by  Abbie Sons, MD while in the presence of Abbie Sons, MD.   01/29/23 1:48 PM   Seth Byrd [birth year] [id number]  Referring provider: Delsa Grana, PA-C [address] [place],  [place] [address]  Chief Complaint  Patient presents with   Prostate Cancer    Urologic history: 1. 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 PSA 4.8, volume 36 g                         elected active surveillance.   Confirmatory biopsy December 2018 1/12 core positive Gleason 3+3 right base involving 1%  PSA 12/23/2021 with slight bump 5.3; repeat 02/23/2022 was 5.6.  MRI performed 04/08/2022 which showed a PI-RADS 3 lesion right anterior mid gland and prostate volume 37 cc.  HPI: 77 y.o. male presents for semiannual follow-up of prostate cancer.   Since the last visit he had follow-up fusion biopsy for PI-RADS 3 lesion 09/2022 and all cores showed no evidence of cancer. No problems since last visit No bothersome LUTS Denies dysuria, gross hematuria Denies flank, abdominal or pelvic pain PSA 01/21/23 was slightly above baseline at 7.1   PSA trend:  Prostate Specific Ag, Serum  Latest Ref Rng 0.0 - 4.0 ng/mL  05/16/2018 4.2 (H)   11/17/2018 4.0   05/16/2019 4.4 (H)   11/23/2019 4.7 (H)   12/04/2020 4.4 (H)   06/17/2021 4.4 (H)   12/23/2021 5.3 (H)   02/23/2022 5.6 (H)   06/25/2022 6.0 (H)   01/21/2023 7.1 (H)      PMH: Past Medical History:  Diagnosis Date   Arthritis    Cancer ([place])    CHF (congestive heart failure) ([place])    Hypertension    Myocardial infarction [facility])    Prostate cancer ([place]) 10/19/2017    Surgical History: Past Surgical History:  Procedure Laterality Date   BACK SURGERY     Spinal Fusion with 2 screws    COLONOSCOPY WITH PROPOFOL N/A 10/31/2021   Procedure: COLONOSCOPY WITH PROPOFOL;  Surgeon: Virgel Manifold, MD;  Location: [facility] ENDOSCOPY;  Service: Endoscopy;  Laterality: N/A;   CORONARY ARTERY BYPASS GRAFT N/A 02/10/2017   Procedure: CORONARY ARTERY BYPASS GRAFTING (CABG), ON PUMP, TIMES THREE, USING LEFT INTERNAL MAMMARY ARTERY AND ENDOSCOPICALLY HARVESTED BILATERAL GREATER SAPHENOUS VEINS;  Surgeon: [name], MD;  Location: [place];  Service: Open Heart Surgery;  Laterality: N/A;  LIMA to LAD SVG to Diag 1 SVG to [place] CATH AND CORONARY ANGIOGRAPHY N/A 02/09/2017   Procedure: Left Heart Cath and Coronary Angiography;  Surgeon: [name], MD;  Location: [place] CV LAB;  Service: Cardiovascular;  Laterality: N/A;   TEE WITHOUT CARDIOVERSION N/A 02/10/2017   Procedure: TRANSESOPHAGEAL ECHOCARDIOGRAM (TEE);  Surgeon: [name], MD;  Location: [place];  Service: Open Heart Surgery;  Laterality: N/A;   TOTAL KNEE ARTHROPLASTY Left 04/23/2015   Procedure: TOTAL KNEE ARTHROPLASTY;  Surgeon: Christophe Louis, MD;  Location: [facility] ORS;  Service: Orthopedics;  Laterality: Left;    Home Medications:  Allergies as of 01/29/2023   No Known Allergies      Medication List  Accurate as of January 29, 2023  1:48 PM. If you have any questions, ask your nurse or doctor.          acetaminophen 500 MG tablet Commonly known as: TYLENOL Take 500 mg by mouth every 6 (six) hours as needed.   aspirin EC 81 MG tablet Take 81 mg by mouth daily.   atorvastatin 40 MG tablet Commonly known as: LIPITOR Take 1 tablet (40 mg total) by mouth daily at 6 PM.   carvedilol 12.5 MG tablet Commonly known as: COREG Take 12.5 mg by mouth 2 (two) times daily with a meal.   CeraVe Crea Apply topically.   clindamycin 1 % external solution Commonly known as: CLEOCIN T   clobetasol 0.05 % external solution Commonly known as: TEMOVATE APPLY TO SCALP  DAILY   cyanocobalamin 1000 MCG tablet Commonly known as: VITAMIN B12 Take 1,000 mcg by mouth daily.   Entresto 49-51 MG Generic drug: sacubitril-valsartan TK 1 T PO BID   Fish Oil 1200 MG Caps Take 1 capsule by mouth 2 (two) times daily.   furosemide 40 MG tablet Commonly known as: LASIX Take 1 tablet (40 mg total) by mouth daily.   Humira Pen 40 MG/0.8ML Pnkt Generic drug: Adalimumab   ketoconazole 2 % cream Commonly known as: NIZORAL   ONE-A-DAY MENS 50+ ADVANTAGE PO Take 1 tablet by mouth daily.   spironolactone 25 MG tablet Commonly known as: ALDACTONE Take 1 tablet (25 mg total) by mouth daily.        Allergies: No Known Allergies  Family History: Family History  Problem Relation Age of Onset   Hypertension Mother    Transient ischemic attack Mother    CVA Mother    Colon cancer Father    Hypertension Father    Prostate cancer Father    Diabetes Brother    Kidney cancer Neg Hx    Bladder Cancer Neg Hx     Social History:  reports that he quit smoking about 17 years ago. His smoking use included cigarettes. He has a 52.50 pack-year smoking history. He has never used smokeless tobacco. He reports that he does not drink alcohol and does not use drugs.   Physical Exam: BP 124/65   Pulse 70   Ht 5\' 10"  (1.778 m)   Wt 175 lb (79.4 kg)   BMI 25.11 kg/m   Constitutional:  Alert and oriented, No acute distress. HEENT: [place] AT, moist mucus membranes.  Trachea midline, no masses. Cardiovascular: No clubbing, cyanosis, or edema. Respiratory: Normal respiratory effort, no increased work of breathing. Neurologic: Grossly intact, no focal deficits, moving all 4 extremities. Psychiatric: Normal mood and affect.  Assessment & Plan:    1.  T1c low risk prostate cancer Most recent PSA slightly increased, however he had a completely negative fusion biopsy November 2023. This may be a transient PSA elevation secondary to inflammation. 6 month follow-up office  visit with PSA/DRE  I have reviewed the above documentation for accuracy and completeness, and I agree with the above.   Abbie Sons, [place] [address] [place], [place] [address] [phone]

## 2023-01-31 ENCOUNTER — Encounter: Payer: Self-pay | Admitting: Urology

## 2023-03-09 ENCOUNTER — Encounter: Payer: Self-pay | Admitting: Family Medicine

## 2023-03-09 ENCOUNTER — Ambulatory Visit: Payer: Managed Care, Other (non HMO) | Admitting: Family Medicine

## 2023-03-09 VITALS — BP 136/70 | HR 70 | Temp 98.0°F | Resp 16 | Ht 70.0 in | Wt 180.9 lb

## 2023-03-09 DIAGNOSIS — I2510 Atherosclerotic heart disease of native coronary artery without angina pectoris: Secondary | ICD-10-CM

## 2023-03-09 DIAGNOSIS — I5022 Chronic systolic (congestive) heart failure: Secondary | ICD-10-CM

## 2023-03-09 DIAGNOSIS — I10 Essential (primary) hypertension: Secondary | ICD-10-CM | POA: Diagnosis not present

## 2023-03-09 DIAGNOSIS — M069 Rheumatoid arthritis, unspecified: Secondary | ICD-10-CM

## 2023-03-09 DIAGNOSIS — C61 Malignant neoplasm of prostate: Secondary | ICD-10-CM

## 2023-03-09 DIAGNOSIS — I255 Ischemic cardiomyopathy: Secondary | ICD-10-CM

## 2023-03-09 DIAGNOSIS — J431 Panlobular emphysema: Secondary | ICD-10-CM

## 2023-03-09 DIAGNOSIS — E782 Mixed hyperlipidemia: Secondary | ICD-10-CM | POA: Diagnosis not present

## 2023-03-09 DIAGNOSIS — I700 Atherosclerosis of aorta: Secondary | ICD-10-CM

## 2023-03-09 NOTE — Assessment & Plan Note (Signed)
On statin, LDL well controlled

## 2023-03-09 NOTE — Assessment & Plan Note (Signed)
Per Dr. Lonna Cobb, last visit last month, reviewed PSA slightly increased thought to be transient/inflammation Still doing 6 month f/up appts and labs 01/29/2023 OV with urology: PSA checked every 6 months with urology f/up - pt reports things have been stable 1. 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 PSA 4.8, volume 36 g                         elected active surveillance.   Confirmatory biopsy December 2018 1/12 core positive Gleason 3+3 right base involving 1%  PSA 12/23/2021 with slight bump 5.3; repeat 02/23/2022 was 5.6.  MRI performed 04/08/2022 which showed a PI-RADS 3 lesion right anterior mid gland and prostate volume 37 cc. Lab Results  Component Value Date   PSA1 7.1 (H) 01/21/2023   PSA1 6.0 (H) 06/25/2022   PSA1 5.6 (H) 02/23/2022   Assessment & Plan:     1.  T1c low risk prostate cancer Most recent PSA slightly increased, however he had a completely negative fusion biopsy November 2023. This may be a transient PSA elevation secondary to inflammation. 6 month follow-up office visit with PSA/DRE

## 2023-03-09 NOTE — Assessment & Plan Note (Signed)
Asx, no recent exacerbations or changes, not on inhalers or seeing pulmonology noted on imaging

## 2023-03-09 NOTE — Assessment & Plan Note (Signed)
CAD s/p CABG (LIMA to LAD, SVG to diagonal, SVG to LCx

## 2023-03-09 NOTE — Progress Notes (Signed)
Name: Seth Byrd   MRN: [id number]    DOB: June 13, 1946   Date:03/09/2023       Progress Note  Chief Complaint  Patient presents with   Follow-up   Hypertension   Hyperlipidemia     Subjective:   Seth Byrd is a 77 y.o. male, presents to clinic for routine f/up - most of his care with managed by specialists - he states they manage all heart/BP/cholesterol meds He also sees urology for monitoring prostate ca Some labs are available through [facility] report - recent chemistry and kidney function, H/H done  In Epic and care everywhere I can only see last PSA in March  Hypertension/CAD/CHF s/p PCI and CAGB x 3 Currently managed on entresto, spironolactone, carvedilol, lasix Pt reports good med compliance and denies any SE.   Blood pressure today is slightly elevated, he didn't take all his morning meds yet, BP better when rechecked 136/70- well controlled. BP Readings from Last 6 Encounters:  03/09/23 (!) 142/74  01/29/23 124/65  09/04/22 130/78  07/15/22 (!) 152/68  12/26/21 (!) 154/71  10/31/21 109/65   Pt denies CP, SOB, exertional sx, LE edema, palpitation, Ha's, visual disturbances, lightheadedness, hypotension, syncope.   Hyperlipidemia: Currently treated with lipitor 40, pt reports good med compliance. Lipids/LDL at goal  Last Lipids: Lab Results  Component Value Date   CHOL 115 09/04/2022   HDL 40 09/04/2022   LDLCALC 58 09/04/2022   TRIG 86 09/04/2022   CHOLHDL 2.9 09/04/2022   - Denies: Chest pain, shortness of breath, myalgias, claudication  RA managed by specialists - on humira  Prostate CA - [place] last ov 01/29/2023: PSA checked every 6 months with urology f/up - pt reports things have been stable 1. 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 PSA 4.8, volume 36 g                         elected active surveillance.   Confirmatory biopsy December 2018 1/12 core  positive Gleason 3+3 right base involving 1%  PSA 12/23/2021 with slight bump 5.3; repeat 02/23/2022 was 5.6.  MRI performed 04/08/2022 which showed a PI-RADS 3 lesion right anterior mid gland and prostate volume 37 cc. Lab Results  Component Value Date   PSA1 7.1 (H) 01/21/2023   PSA1 6.0 (H) 06/25/2022   PSA1 5.6 (H) 02/23/2022   Assessment & Plan:     1.  T1c low risk prostate cancer Most recent PSA slightly increased, however he had a completely negative fusion biopsy November 2023. This may be a transient PSA elevation secondary to inflammation. 6 month follow-up office visit with PSA/DRE  No hx of DM - last glucose and A1C per [facility] in normal range  Lab Results  Component Value Date   HGBA1C 5.5 09/04/2022       Current Outpatient Medications:    acetaminophen (TYLENOL) 500 MG tablet, Take 500 mg by mouth every 6 (six) hours as needed., Disp: , Rfl:    aspirin EC 81 MG tablet, Take 81 mg by mouth daily., Disp: , Rfl:    atorvastatin (LIPITOR) 40 MG tablet, Take 1 tablet (40 mg total) by mouth daily at 6 PM., Disp: , Rfl:    carvedilol (COREG) 12.5 MG tablet, Take 12.5 mg by mouth 2 (two) times daily with a meal., Disp: , Rfl: 3   clindamycin (CLEOCIN T) 1 % external solution, ,  Disp: , Rfl:    clobetasol (TEMOVATE) 0.05 % external solution, APPLY TO SCALP DAILY, Disp: , Rfl: 5   Emollient (CERAVE) CREA, Apply topically., Disp: , Rfl:    ENTRESTO 49-51 MG, TK 1 T PO BID, Disp: , Rfl:    furosemide (LASIX) 40 MG tablet, Take 1 tablet (40 mg total) by mouth daily., Disp: 30 tablet, Rfl:    HUMIRA PEN 40 MG/0.8ML PNKT, , Disp: , Rfl:    ketoconazole (NIZORAL) 2 % cream, , Disp: , Rfl:    Multiple Vitamins-Minerals (ONE-A-DAY MENS 50+ ADVANTAGE PO), Take 1 tablet by mouth daily., Disp: , Rfl:    Omega-3 Fatty Acids (FISH OIL) 1200 MG CAPS, Take 1 capsule by mouth 2 (two) times daily. , Disp: , Rfl:    spironolactone (ALDACTONE) 25 MG tablet, Take 1 tablet (25 mg total) by mouth  daily., Disp: , Rfl:    vitamin B-12 (CYANOCOBALAMIN) 1000 MCG tablet, Take 1,000 mcg by mouth daily. 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   History of heart attack 02/09/2017   Essential hypertension 11/25/2015   Hyperlipidemia 11/25/2015   Panlobular emphysema (HCC) 11/25/2015   Status post total left knee replacement 11/25/2015   Primary osteoarthritis involving multiple joints 11/25/2015   Primary osteoarthritis of knee 04/23/2015    Past Surgical History:  Procedure Laterality Date   BACK SURGERY     Spinal Fusion with 2 screws   COLONOSCOPY WITH PROPOFOL N/A 10/31/2021   Procedure: COLONOSCOPY WITH PROPOFOL;  Surgeon: Pasty Spillers, MD;  Location: [facility] ENDOSCOPY;  Service: Endoscopy;  Laterality: N/A;   CORONARY ARTERY BYPASS GRAFT N/A 02/10/2017   Procedure: CORONARY ARTERY BYPASS GRAFTING (CABG), ON PUMP, TIMES THREE, USING LEFT INTERNAL MAMMARY ARTERY AND ENDOSCOPICALLY HARVESTED BILATERAL GREATER SAPHENOUS VEINS;  Surgeon: Delight Ovens, MD;  Location: [facility] OR;  Service: Open Heart Surgery;  Laterality: N/A;  LIMA to LAD SVG to Diag 1 SVG to OM1   JOINT REPLACEMENT     LEFT HEART CATH AND CORONARY ANGIOGRAPHY N/A 02/09/2017   Procedure: Left Heart Cath and Coronary Angiography;  Surgeon: Dalia Heading, MD;  Location: [facility] INVASIVE CV LAB;  Service: Cardiovascular;  Laterality: N/A;   TEE WITHOUT CARDIOVERSION N/A 02/10/2017   Procedure: TRANSESOPHAGEAL ECHOCARDIOGRAM (TEE);  Surgeon: Delight Ovens, MD;  [facility];  Service: Open Heart Surgery;  Laterality: N/A;   TOTAL KNEE ARTHROPLASTY Left 04/23/2015   Procedure:  TOTAL KNEE ARTHROPLASTY;  Surgeon: Myra Rude, MD;  Location: [facility] ORS;  Service: Orthopedics;  Laterality: Left;    Family History  Problem Relation Age of Onset   Hypertension Mother    Transient ischemic attack Mother    CVA Mother    Colon cancer Father    Hypertension Father    Prostate cancer Father    Diabetes Brother    Kidney cancer Neg Hx    Bladder Cancer Neg Hx     Social History   Tobacco Use   Smoking status: Former    Packs/day: 1.50    Years: 35.00    Additional pack years: 0.00    Total pack years: 52.50  Types: Cigarettes    Quit date: 04/09/2005    Years since quitting: 17.9   Smokeless tobacco: Never  Vaping Use   Vaping Use: Never used  Substance Use Topics   Alcohol use: No   Drug use: No     No Known Allergies  Health Maintenance  Topic Date Due   COVID-19 Vaccine (6 - 2023-24 season) 03/25/2023 (Originally 07/10/2022)   Zoster Vaccines- Shingrix (1 of 2) 06/08/2023 (Originally 11/10/1965)   Pneumonia Vaccine 89+ Years old (2 of 2 - PPSV23 or PCV20) 03/08/2024 (Originally 09/25/2016)   INFLUENZA VACCINE  06/10/2023   COLONOSCOPY (Pts 45-54yrs Insurance coverage will need to be confirmed)  10/31/2024   DTaP/Tdap/Td (2 - Td or Tdap) 09/25/2027   Hepatitis C Screening  Completed   HPV VACCINES  Aged Out   Fecal DNA (Cologuard)  Discontinued    Chart Review Today: I personally reviewed active problem list, medication list, allergies, family history, social history, health maintenance, notes from last encounter, lab results, imaging with the patient/caregiver today.   Review of Systems  Constitutional: Negative.   HENT: Negative.    Eyes: Negative.   Respiratory: Negative.    Cardiovascular: Negative.   Gastrointestinal: Negative.   Endocrine: Negative.   Genitourinary: Negative.   Musculoskeletal: Negative.   Skin: Negative.   Allergic/Immunologic: Negative.   Neurological: Negative.   Hematological: Negative.    Psychiatric/Behavioral: Negative.    All other systems reviewed and are negative.    Objective:   Vitals:   03/09/23 0831 03/09/23 0857  BP: (!) 142/74 136/70  Pulse: 70   Resp: 16   Temp: 98 F (36.7 C)   TempSrc: Oral   SpO2: 97%   Weight: 180 lb 14.4 oz (82.1 kg)   Height: 5\' 10"  (1.778 m)     Body mass index is 25.96 kg/m.  Physical Exam Vitals and nursing note reviewed.  Constitutional:      General: He is not in acute distress.    Appearance: Normal appearance. He is well-developed and normal weight. He is not ill-appearing, toxic-appearing or diaphoretic.  HENT:     Head: Normocephalic and atraumatic.     Nose: Nose normal.  Eyes:     General:        Right eye: No discharge.        Left eye: No discharge.     Conjunctiva/sclera: Conjunctivae normal.  Neck:     Trachea: No tracheal deviation.  Cardiovascular:     Rate and Rhythm: Normal rate and regular rhythm.     Chest Wall: PMI is displaced.     Pulses: Normal pulses.          Radial pulses are 2+ on the right side and 2+ on the left side.     Heart sounds: Normal heart sounds. No murmur heard.    No friction rub. No gallop.  Pulmonary:     Effort: Pulmonary effort is normal. No respiratory distress.     Breath sounds: No stridor.  Musculoskeletal:     Right lower leg: No edema.     Left lower leg: No edema.  Skin:    General: Skin is warm and dry.     Findings: No rash.  Neurological:     Mental Status: He is alert.     Motor: No abnormal muscle tone.     Coordination: Coordination normal.  Psychiatric:        Mood and Affect: Mood normal.  Behavior: Behavior normal.         Assessment & Plan:   Problem List Items Addressed This Visit       Cardiovascular and Mediastinum   Essential hypertension - Primary (Chronic)    BP at goal today, stable, meds managed by [facility], good med compliance no current med SE and no concerning cardiac sx - he follows with [facility]  regularly BP Readings from Last 3 Encounters:  03/09/23 136/70  01/29/23 124/65  09/04/22 130/78  Con't entresto, spironolactone, lasix, carvedilol       Chronic systolic CHF (congestive heart failure) (HCC) (Chronic)    Pt appears euvolemic today No orthopnea, PND, LE edema, weight gain Last cardiology note that specifies EF was about a year ago: "severe ischemic cardiomyopathy with an EF of 35%" On quad therapy per cardiology: ECHO 04/2022: Echo 04/2022- MODERATE LV SYSTOLIC DYSFUNCTION (See above) WITH MILD LVH NORMAL RIGHT VENTRICULAR SYSTOLIC FUNCTION MILD VALVULAR REGURGITATION (See above) NO VALVULAR STENOSIS Tricuspid: MILD TR Closest EF: 35% (Estimated) Contraction: MOD GLOBAL DECREASE Mitral: MILD MR Aortic: TRIVIAL AR Pulmonary: MILD PR        Ischemic cardiomyopathy (Chronic)    See CAD - per cardiology      CAD (coronary artery disease) (Chronic)    CAD s/p CABG (LIMA to LAD, SVG to diagonal, SVG to LCx       Aortic atherosclerosis (HCC)    On statin, LDL well controlled        Respiratory   Panlobular emphysema (HCC)    Asx, no recent exacerbations or changes, not on inhalers or seeing pulmonology noted on imaging        Musculoskeletal and Integument   Rheumatoid arthritis involving multiple sites [facility])    Managed on humira Sx stable Unable to see the specialists OV/notes/labs        Genitourinary   Prostate cancer [facility])    Per Dr. Lonna Cobb, last visit last month, reviewed PSA slightly increased thought to be transient/inflammation Still doing 6 month f/up appts and labs 01/29/2023 OV with urology: PSA checked every 6 months with urology f/up - pt reports things have been stable 1. 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 PSA 4.8, volume 36 g                         elected active surveillance.   Confirmatory biopsy December 2018 1/12 core positive  Gleason 3+3 right base involving 1%  PSA 12/23/2021 with slight bump 5.3; repeat 02/23/2022 was 5.6.  MRI performed 04/08/2022 which showed a PI-RADS 3 lesion right anterior mid gland and prostate volume 37 cc. Lab Results  Component Value Date   PSA1 7.1 (H) 01/21/2023   PSA1 6.0 (H) 06/25/2022   PSA1 5.6 (H) 02/23/2022   Assessment & Plan:     1.  T1c low risk prostate cancer Most recent PSA slightly increased, however he had a completely negative fusion biopsy November 2023. This may be a transient PSA elevation secondary to inflammation. 6 month follow-up office visit with PSA/DRE        Other   Hyperlipidemia (Chronic)    LDL at goal with atorvastatin 40 mg Lab Results  Component Value Date   CHOL 115 09/04/2022   HDL 40 09/04/2022   LDLCALC 58 09/04/2022   TRIG 86 09/04/2022   CHOLHDL 2.9 09/04/2022  Will continue to monitor annually when  LDL at goal Good med compliance, no SE or concerns No concerning sx today         No follow-ups on file.   Danelle Berry, PA-C 03/09/23 8:44 AM

## 2023-03-09 NOTE — Assessment & Plan Note (Signed)
BP at goal today, stable, meds managed by [facility], good med compliance no current med SE and no concerning cardiac sx - he follows [facility] regularly BP Readings from Last 3 Encounters:  03/09/23 136/70  01/29/23 124/65  09/04/22 130/78  Con't entresto, spironolactone, lasix, carvedilol

## 2023-03-09 NOTE — Assessment & Plan Note (Signed)
Managed on humira Sx stable Unable to see the specialists OV/notes/labs

## 2023-03-09 NOTE — Assessment & Plan Note (Signed)
LDL at goal with atorvastatin 40 mg Lab Results  Component Value Date   CHOL 115 09/04/2022   HDL 40 09/04/2022   LDLCALC 58 09/04/2022   TRIG 86 09/04/2022   CHOLHDL 2.9 09/04/2022  Will continue to monitor annually when LDL at goal Good med compliance, no SE or concerns No concerning sx today

## 2023-03-09 NOTE — Assessment & Plan Note (Signed)
See CAD - per cardiology

## 2023-03-09 NOTE — Assessment & Plan Note (Addendum)
Pt appears euvolemic today No orthopnea, PND, LE edema, weight gain Last cardiology note that specifies EF was about a year ago: "severe ischemic cardiomyopathy with an EF of 35%" On quad therapy per cardiology: ECHO 04/2022: Echo 04/2022- MODERATE LV SYSTOLIC DYSFUNCTION (See above) WITH MILD LVH NORMAL RIGHT VENTRICULAR SYSTOLIC FUNCTION MILD VALVULAR REGURGITATION (See above) NO VALVULAR STENOSIS Tricuspid: MILD TR Closest EF: 35% (Estimated) Contraction: MOD GLOBAL DECREASE Mitral: MILD MR Aortic: TRIVIAL AR Pulmonary: MILD PR

## 2023-07-28 ENCOUNTER — Other Ambulatory Visit: Payer: Self-pay | Admitting: *Deleted

## 2023-07-28 DIAGNOSIS — C61 Malignant neoplasm of prostate: Secondary | ICD-10-CM

## 2023-08-02 ENCOUNTER — Other Ambulatory Visit: Payer: Managed Care, Other (non HMO)

## 2023-08-02 DIAGNOSIS — C61 Malignant neoplasm of prostate: Secondary | ICD-10-CM

## 2023-08-03 LAB — PSA: Prostate Specific Ag, Serum: 6.1 ng/mL — ABNORMAL HIGH (ref 0.0–4.0)

## 2023-08-04 ENCOUNTER — Ambulatory Visit: Payer: Managed Care, Other (non HMO) | Admitting: Urology

## 2023-08-04 ENCOUNTER — Encounter: Payer: Self-pay | Admitting: Urology

## 2023-08-04 VITALS — BP 121/72 | HR 92 | Ht 70.0 in | Wt 170.0 lb

## 2023-08-04 DIAGNOSIS — C61 Malignant neoplasm of prostate: Secondary | ICD-10-CM

## 2023-08-04 NOTE — Progress Notes (Signed)
I, Seth Byrd, acting as a Neurosurgeon for Seth Altes, MD.,have documented all relevant documentation on the behalf of Seth Altes, MD,a s directed by  Seth Altes, MD while in the presence of Seth Altes, MD.  08/04/2023 2:20 PM   Guy Begin Mar 13, 1946 [id number]  Referring provider: Danelle Berry, PA-C [address] [place],  [place] [address]  Chief Complaint  Patient presents with   Prostate Cancer    Urologic history: 1. 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 PSA 4.8, volume 36 g                         elected active surveillance.   Confirmatory biopsy December 2018 1/12 core positive Gleason 3+3 right base involving 1%  PSA 12/23/2021 with slight bump 5.3; repeat 02/23/2022 was 5.6.  MRI performed 04/08/2022 which showed a PI-RADS 3 lesion right anterior mid gland and prostate volume 37 cc. Repeat MR fusion 08/18/22 for PSA 6.0. Underwent biopsy of the PI-RADS 3 lesions, which was a benign. Standard template biopsy was not performed.   HPI: 76 y.o. male presents for follow-up visit.   No problems since last visit No bothersome LUTS Denies dysuria, gross hematuria Denies flank, abdominal or pelvic pain PSA 08/02/23 decreased at 6.1   PMH: Past Medical History:  Diagnosis Date   Arthritis    Cancer (HCC)    CHF (congestive heart failure) (HCC)    Hypertension    Myocardial infarction [facility])    Prostate cancer (HCC) 10/19/2017    Surgical History: Past Surgical History:  Procedure Laterality Date   BACK SURGERY     Spinal Fusion with 2 screws   COLONOSCOPY WITH PROPOFOL N/A 10/31/2021   Procedure: COLONOSCOPY WITH PROPOFOL;  Surgeon: Pasty Spillers, MD;  Location: [facility] ENDOSCOPY;  Service: Endoscopy;  Laterality: N/A;   CORONARY ARTERY BYPASS GRAFT N/A 02/10/2017   Procedure: CORONARY ARTERY BYPASS GRAFTING (CABG), ON PUMP, TIMES THREE,  USING LEFT INTERNAL MAMMARY ARTERY AND ENDOSCOPICALLY HARVESTED BILATERAL GREATER SAPHENOUS VEINS;  Surgeon: Delight Ovens, MD;  Location: [facility] OR;  Service: Open Heart Surgery;  Laterality: N/A;  LIMA to LAD SVG to Diag 1 SVG to OM1   JOINT REPLACEMENT     LEFT HEART CATH AND CORONARY ANGIOGRAPHY N/A 02/09/2017   Procedure: Left Heart Cath and Coronary Angiography;  Surgeon: Dalia Heading, MD;  Location: [facility] INVASIVE CV LAB;  Service: Cardiovascular;  Laterality: N/A;   TEE WITHOUT CARDIOVERSION N/A 02/10/2017   Procedure: TRANSESOPHAGEAL ECHOCARDIOGRAM (TEE);  Surgeon: Delight Ovens, MD;  Location: [facility] OR;  Service: Open Heart Surgery;  Laterality: N/A;   TOTAL KNEE ARTHROPLASTY Left 04/23/2015   Procedure: TOTAL KNEE ARTHROPLASTY;  Surgeon: Myra Rude, MD;  Location: [facility] ORS;  Service: Orthopedics;  Laterality: Left;    Home Medications:  Allergies as of 08/04/2023   No Known Allergies      Medication List        Accurate as of August 04, 2023  2:20 PM. If you have any questions, ask your nurse or doctor.          acetaminophen 500 MG tablet Commonly known as: TYLENOL Take 500 mg by mouth every 6 (six) hours as needed.   aspirin EC 81 MG tablet Take 81 mg by mouth daily.   atorvastatin 40 MG tablet Commonly known as: LIPITOR  Take 1 tablet (40 mg total) by mouth daily at 6 PM.   carvedilol 12.5 MG tablet Commonly known as: COREG Take 12.5 mg by mouth 2 (two) times daily with a meal.   CeraVe Crea Apply topically.   clindamycin 1 % external solution Commonly known as: CLEOCIN T   clobetasol 0.05 % external solution Commonly known as: TEMOVATE APPLY TO SCALP DAILY   cyanocobalamin 1000 MCG tablet Commonly known as: VITAMIN B12 Take 1,000 mcg by mouth daily.   Entresto 49-51 MG Generic drug: sacubitril-valsartan TK 1 T PO BID   Fish Oil 1200 MG Caps Take 1 capsule by mouth 2 (two) times daily.   furosemide 40 MG tablet Commonly known as:  LASIX Take 1 tablet (40 mg total) by mouth daily.   Humira Pen 40 MG/0.8ML Pnkt pen Generic drug: adalimumab   ketoconazole 2 % cream Commonly known as: NIZORAL   ONE-A-DAY MENS 50+ ADVANTAGE PO Take 1 tablet by mouth daily.   spironolactone 25 MG tablet Commonly known as: ALDACTONE Take 1 tablet (25 mg total) by mouth daily.        Family History: Family History  Problem Relation Age of Onset   Hypertension Mother    Transient ischemic attack Mother    CVA Mother    Colon cancer Father    Hypertension Father    Prostate cancer Father    Diabetes Brother    Kidney cancer Neg Hx    Bladder Cancer Neg Hx     Social History:  reports that he quit smoking about 18 years ago. His smoking use included cigarettes. He started smoking about 53 years ago. He has a 52.5 pack-year smoking history. He has never used smokeless tobacco. He reports that he does not drink alcohol and does not use drugs.   Physical Exam: BP 121/72   Pulse 92   Ht 5\' 10"  (1.778 m)   Wt 170 lb (77.1 kg)   BMI 24.39 kg/m   Constitutional:  Alert and oriented, No acute distress. HEENT: [place] AT Respiratory: Normal respiratory effort, no increased work of breathing. Psychiatric: Normal mood and affect.   Assessment & Plan:    1.  T1c low risk prostate cancer Stable PSA, benign DRE 1 year follow-up office visit with PSA/DRE Lab visit 6 months for PSA   I have reviewed the above documentation for accuracy and completeness, and I agree with the above.   Seth Altes, MD  [facility] [address] [place], [place] [address] [phone]

## 2023-09-10 ENCOUNTER — Ambulatory Visit (INDEPENDENT_AMBULATORY_CARE_PROVIDER_SITE_OTHER): Payer: Managed Care, Other (non HMO) | Admitting: Nurse Practitioner

## 2023-09-10 ENCOUNTER — Other Ambulatory Visit: Payer: Self-pay

## 2023-09-10 ENCOUNTER — Encounter: Payer: Self-pay | Admitting: Nurse Practitioner

## 2023-09-10 VITALS — BP 124/82 | HR 66 | Temp 97.9°F | Resp 16 | Ht 70.0 in | Wt 176.9 lb

## 2023-09-10 DIAGNOSIS — Z130 Encounter for screening for diseases of the blood and blood-forming organs and certain disorders involving the immune mechanism: Secondary | ICD-10-CM

## 2023-09-10 DIAGNOSIS — I10 Essential (primary) hypertension: Secondary | ICD-10-CM | POA: Diagnosis not present

## 2023-09-10 DIAGNOSIS — Z131 Encounter for screening for diabetes mellitus: Secondary | ICD-10-CM

## 2023-09-10 DIAGNOSIS — Z0000 Encounter for general adult medical examination without abnormal findings: Secondary | ICD-10-CM | POA: Diagnosis not present

## 2023-09-10 DIAGNOSIS — Z13220 Encounter for screening for lipoid disorders: Secondary | ICD-10-CM

## 2023-09-10 DIAGNOSIS — E782 Mixed hyperlipidemia: Secondary | ICD-10-CM

## 2023-09-11 LAB — CBC WITH DIFFERENTIAL/PLATELET
Basophils Absolute: 0 10*3/uL (ref 0.0–0.2)
Basos: 1 %
EOS (ABSOLUTE): 0.3 10*3/uL (ref 0.0–0.4)
Eos: 6 %
Hematocrit: 39.1 % (ref 37.5–51.0)
Hemoglobin: 13.2 g/dL (ref 13.0–17.7)
Immature Grans (Abs): 0 10*3/uL (ref 0.0–0.1)
Immature Granulocytes: 0 %
Lymphocytes Absolute: 2.3 10*3/uL (ref 0.7–3.1)
Lymphs: 48 %
MCH: 33.7 pg — ABNORMAL HIGH (ref 26.6–33.0)
MCHC: 33.8 g/dL (ref 31.5–35.7)
MCV: 100 fL — ABNORMAL HIGH (ref 79–97)
Monocytes Absolute: 0.4 10*3/uL (ref 0.1–0.9)
Monocytes: 8 %
Neutrophils Absolute: 1.8 10*3/uL (ref 1.4–7.0)
Neutrophils: 37 %
Platelets: 187 10*3/uL (ref 150–450)
RBC: 3.92 x10E6/uL — ABNORMAL LOW (ref 4.14–5.80)
RDW: 11.1 % — ABNORMAL LOW (ref 11.6–15.4)
WBC: 4.8 10*3/uL (ref 3.4–10.8)

## 2023-09-11 LAB — LIPID PANEL WITH LDL/HDL RATIO
Cholesterol, Total: 127 mg/dL (ref 100–199)
HDL: 44 mg/dL (ref >39)
LDL Chol Calc (NIH): 66 mg/dL (ref 0–99)
LDL/HDL Ratio: 1.5 ratio (ref 0.0–3.6)
Triglycerides: 89 mg/dL (ref 0–149)
VLDL Cholesterol Cal: 17 mg/dL (ref 5–40)

## 2023-09-11 LAB — COMPREHENSIVE METABOLIC PANEL
ALT: 24 [IU]/L (ref 0–44)
AST: 27 [IU]/L (ref 0–40)
Albumin: 4.8 g/dL (ref 3.8–4.8)
Alkaline Phosphatase: 54 [IU]/L (ref 44–121)
BUN/Creatinine Ratio: 21 (ref 10–24)
BUN: 23 mg/dL (ref 8–27)
Bilirubin Total: 0.8 mg/dL (ref 0.0–1.2)
CO2: 24 mmol/L (ref 20–29)
Calcium: 10.5 mg/dL — ABNORMAL HIGH (ref 8.6–10.2)
Chloride: 103 mmol/L (ref 96–106)
Creatinine, Ser: 1.1 mg/dL (ref 0.76–1.27)
Globulin, Total: 3.3 g/dL (ref 1.5–4.5)
Glucose: 97 mg/dL (ref 70–99)
Potassium: 4.4 mmol/L (ref 3.5–5.2)
Sodium: 143 mmol/L (ref 134–144)
Total Protein: 8.1 g/dL (ref 6.0–8.5)
eGFR: 70 mL/min/{1.73_m2} (ref >59)

## 2023-09-11 LAB — HEMOGLOBIN A1C
Est. average glucose Bld gHb Est-mCnc: 105 mg/dL
Hgb A1c MFr Bld: 5.3 % (ref 4.8–5.6)

## 2023-09-13 ENCOUNTER — Other Ambulatory Visit: Payer: Self-pay | Admitting: Nurse Practitioner

## 2023-09-15 ENCOUNTER — Other Ambulatory Visit: Payer: Self-pay | Admitting: Emergency Medicine

## 2023-09-15 DIAGNOSIS — Z130 Encounter for screening for diseases of the blood and blood-forming organs and certain disorders involving the immune mechanism: Secondary | ICD-10-CM

## 2023-09-17 LAB — VITAMIN D 25 HYDROXY (VIT D DEFICIENCY, FRACTURES): Vit D, 25-Hydroxy: 79.9 ng/mL (ref 30.0–100.0)

## 2023-09-17 LAB — PTH, INTACT AND CALCIUM
Calcium: 10.6 mg/dL — ABNORMAL HIGH (ref 8.6–10.2)
PTH: 22 pg/mL (ref 15–65)

## 2023-11-18 IMAGING — MR MR PROSTATE WO/W CM
11 of 13 series · 43 of 47 positions shown · IV contrast (gadavist)
Comparison: None Available.

CLINICAL DATA: Low risk prostate carcinoma, Gleason score 6. Active
surveillance.

EXAM:
MR PROSTATE WITHOUT AND WITH CONTRAST
TECHNIQUE: Multiplanar multisequence MRI images were obtained of the pelvis
centered about the prostate. Pre and post contrast images were
obtained.
CONTRAST:  7.5mL GADAVIST GADOBUTROL 1 MMOL/ML IV SOLN

[Series 3: ax in&out whole · axial · 6.0mm · 0.74mm/px · 1 of 35 slices shown (1 of 2)]
[im 1/35]
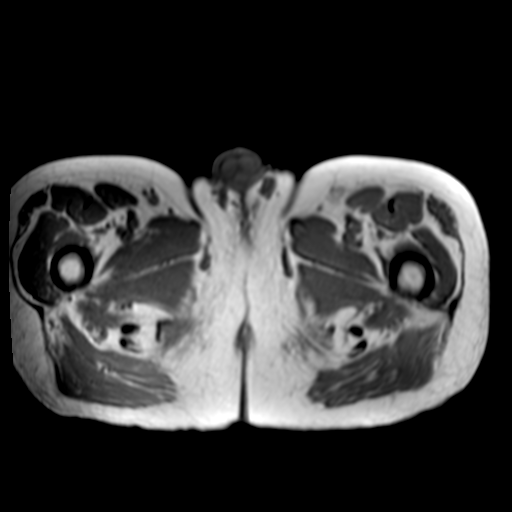

[Series 3: ax in&out whole · axial · 6.0mm · 0.74mm/px · 1 of 35 slices shown (2 of 2)]
[im 1/35]
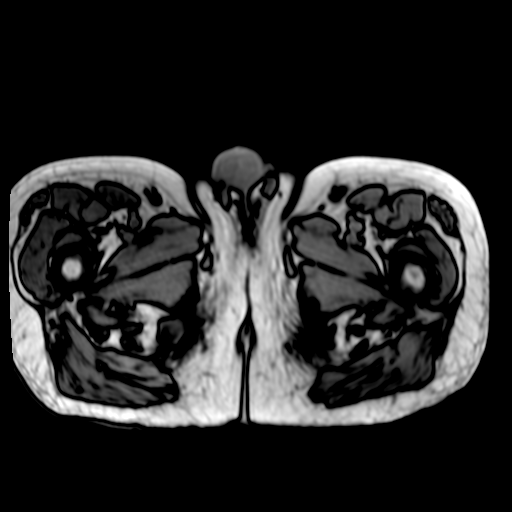

[Series 4: T2 · coronal · 3.0mm · 0.70mm/px · 1 of 35 slices shown (1 of 4)]
[im 1/35]
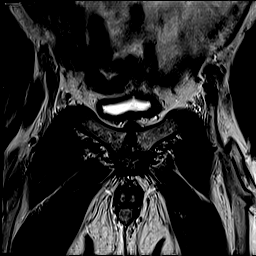

[Series 5: T2 · axial · 3.0mm · 0.56mm/px · 1 of 25 slices shown (2 of 4)]
[im 1/25]
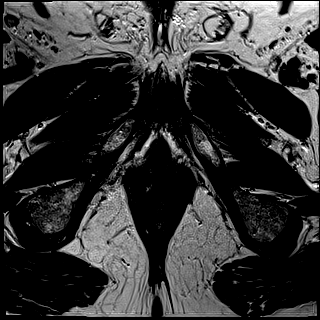

[Series 6: DWI · axial · 3.0mm · 0.86mm/px · 1 of 75 slices shown (1 of 3)]
[im 1/75]
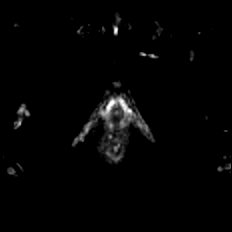

[Series 7: DWI · axial · 3.0mm · 0.86mm/px · 1 of 25 slices shown (2 of 3)]
[im 1/25]
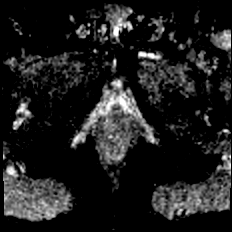

[Series 8: DWI · axial · 3.0mm · 0.86mm/px · 1 of 25 slices shown (3 of 3)]
[im 1/25]
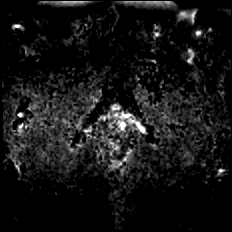

[Series 9: T2 · axial · 1.0mm · 1.04mm/px · 1 of 80 slices shown (3 of 4)]
[im 1/80]
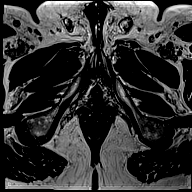

[Series 9: T2 · axial · 1.0mm · 1.04mm/px · 1 of 80 slices shown (4 of 4)]
[im 80/80]
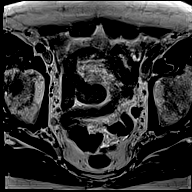

[Series 10: T1 · axial · 3.0mm · 1.15mm/px · z∈[+27,+108]mm · 17 of 700 slices shown (1 of 2)]
[im 1/700]
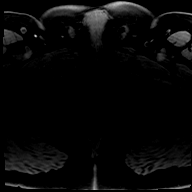
[im 44/700]
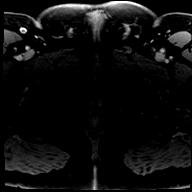
[im 88/700]
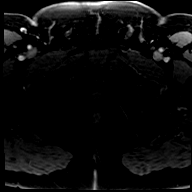
[im 132/700]
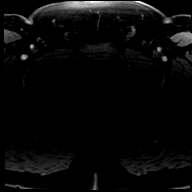
[im 175/700]
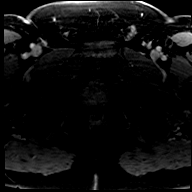
[im 219/700]
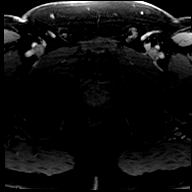
[im 263/700]
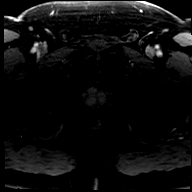
[im 306/700]
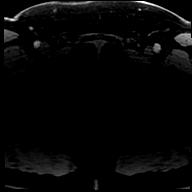
[im 350/700]
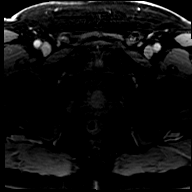
[im 394/700]
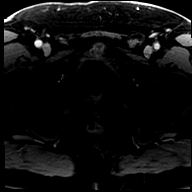
[im 437/700]
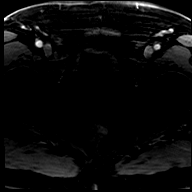
[im 481/700]
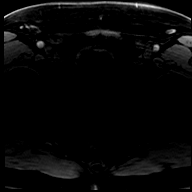
[im 525/700]
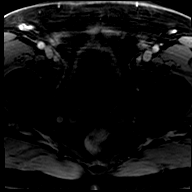
[im 568/700]
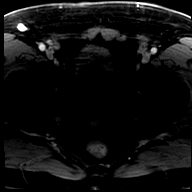
[im 612/700]
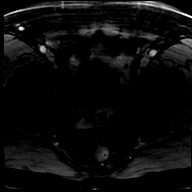
[im 656/700]
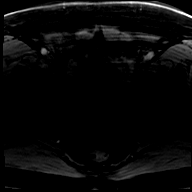
[im 700/700]
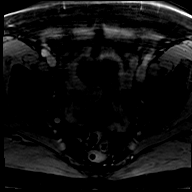

[Series 11: T1 · axial · 3.0mm · 1.15mm/px · z∈[+27,+108]mm · 17 of 670 slices shown (2 of 2)]
[im 1/670]
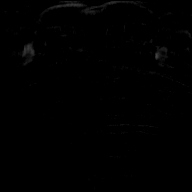
[im 42/670]
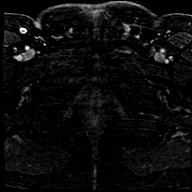
[im 84/670]
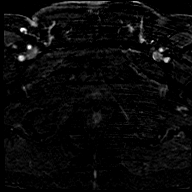
[im 126/670]
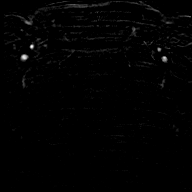
[im 168/670]
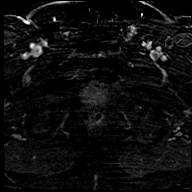
[im 210/670]
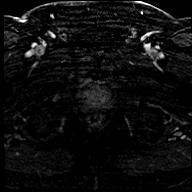
[im 251/670]
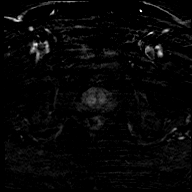
[im 293/670]
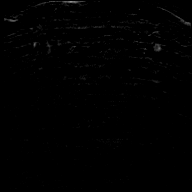
[im 335/670]
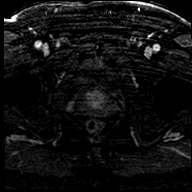
[im 377/670]
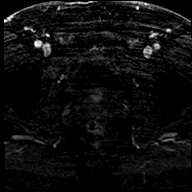
[im 419/670]
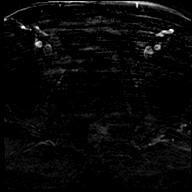
[im 460/670]
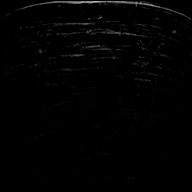
[im 502/670]
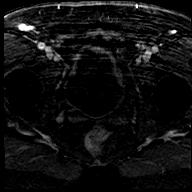
[im 544/670]
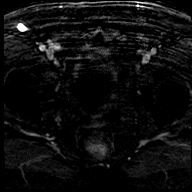
[im 586/670]
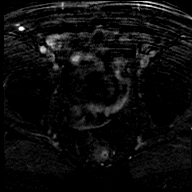
[im 628/670]
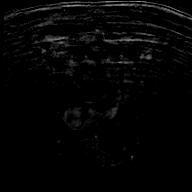
[im 670/670]
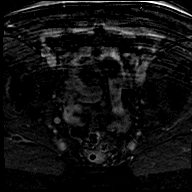

[43 of 47 positions shown; findings below may reference images not displayed]

FINDINGS: Prostate:

-- Peripheral Zone: No focal lesion seen on ADC or high b-value DWI
sequences.

-- Transition/Central Zone: Mildly enlarged. Focal ill-defined area
of heterogeneous T2 hypointensity is seen in the right anterior mid
gland , which shows moderate ADC hypointensity and mild-to-moderate
DWI hyperintensity. This measures approximately 1.7 by 1.1 cm on
image [DATE]. PI-RADS 3

-- Measurements/Volume:  4.0 by 3.5 x 5.1 cm (volume = 37 cm^3)

Transcapsular spread:  Absent

Seminal vesicle involvement:  Absent

Neurovascular bundle involvement:  Absent

Pelvic adenopathy: None visualized

Bone metastasis: None visualized

Other:  Sigmoid diverticulosis, without evidence of diverticulitis.
IMPRESSION: Focal transition zone lesion in the right anterior mid gland, which
is indeterminate. PI-RADS 3 (v2.1): Intermediate (clinically
significant cancer equivocal)

No evidence of extracapsular extension or pelvic metastatic disease.

(I have post-processed this exam in the DynaCAD application for
potential fusion-guided biopsy.)

## 2024-02-01 ENCOUNTER — Other Ambulatory Visit: Payer: Managed Care, Other (non HMO)

## 2024-02-01 DIAGNOSIS — C61 Malignant neoplasm of prostate: Secondary | ICD-10-CM

## 2024-02-02 LAB — PSA: Prostate Specific Ag, Serum: 7.5 ng/mL — ABNORMAL HIGH (ref 0.0–4.0)

## 2024-03-09 ENCOUNTER — Encounter: Payer: Self-pay | Admitting: Internal Medicine

## 2024-03-09 ENCOUNTER — Ambulatory Visit: Payer: Self-pay | Admitting: Internal Medicine

## 2024-03-09 VITALS — BP 120/68 | HR 70 | Resp 16 | Ht 70.0 in | Wt 173.0 lb

## 2024-03-09 DIAGNOSIS — M069 Rheumatoid arthritis, unspecified: Secondary | ICD-10-CM

## 2024-03-09 DIAGNOSIS — I5022 Chronic systolic (congestive) heart failure: Secondary | ICD-10-CM

## 2024-03-09 DIAGNOSIS — I10 Essential (primary) hypertension: Secondary | ICD-10-CM | POA: Diagnosis not present

## 2024-03-09 DIAGNOSIS — E782 Mixed hyperlipidemia: Secondary | ICD-10-CM | POA: Diagnosis not present

## 2024-03-09 NOTE — Assessment & Plan Note (Signed)
 New medication just started, following with Dermatology and Rheumatology.

## 2024-03-09 NOTE — Progress Notes (Signed)
 Established Patient Office Visit  Subjective   Patient ID: Seth Byrd, male    DOB: [birth year]  Age: 78 y.o. MRN: [id number]  Chief Complaint  Patient presents with   Medical Management of Chronic Issues    HPI  Patient is here today for follow up on chronic medical conditions. This is my first time meeting him.   Hypertension/CHF: -Following with Cardiology -Medications: Entresto , Spironolactone , Coreg  12.5 mg BID, Lasix  40 mg daily -Patient is compliant with above medications and reports no side effects. -Denies any SOB, CP, vision changes, LE edema or symptoms of hypotension  HLD/Hx of PCI and CABGx3: -Medications: Lipitor 40 mg -Patient is compliant with above medications and reports no side effects.  -Last lipid panel: Lipid Panel     Component Value Date/Time   CHOL 127 09/10/2023 1547   TRIG 89 09/10/2023 1547   HDL 44 09/10/2023 1547   CHOLHDL 2.9 09/04/2022 0930   LDLCALC 66 09/10/2023 1547   LABVLDL 17 09/10/2023 1547   Elevated PSA: -Following with Urology -PSA 3/25 7.5 with plans to monitor every 6 months -Denies urinary symptoms   Psoriatic/RA: -Following with Rheumatology and Dermatology -Now on Bimzelx, just switched from [facility]   Health Maintenance: -Blood work UTD -Immunizations UTD  Patient Active Problem List   Diagnosis Date Noted   Aortic atherosclerosis (HCC) 10/02/2020   Prostate cancer (HCC) 10/19/2017   Chronic systolic CHF (congestive heart failure) (HCC) 02/25/2017   Ischemic cardiomyopathy 02/25/2017   CAD (coronary artery disease) 02/25/2017   Rheumatoid arthritis involving multiple sites (HCC) 02/23/2017   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   History of heart attack 02/09/2017   Essential hypertension 11/25/2015   Hyperlipidemia 11/25/2015   Panlobular emphysema (HCC) 11/25/2015   Status post total left knee replacement 11/25/2015   Primary osteoarthritis involving multiple joints 11/25/2015   Primary osteoarthritis of knee  04/23/2015   Past Medical History:  Diagnosis Date   Arthritis    Cancer (HCC)    CHF (congestive heart failure) (HCC)    Hypertension    Myocardial infarction [facility])    Prostate cancer (HCC) 10/19/2017   Past Surgical History:  Procedure Laterality Date   BACK SURGERY     Spinal Fusion with 2 screws   COLONOSCOPY WITH PROPOFOL  N/A 10/31/2021   Procedure: COLONOSCOPY WITH PROPOFOL ;  Surgeon: Irby Mannan, MD;  Location: [facility] ENDOSCOPY;  Service: Endoscopy;  Laterality: N/A;   CORONARY ARTERY BYPASS GRAFT N/A 02/10/2017   Procedure: CORONARY ARTERY BYPASS GRAFTING (CABG), ON PUMP, TIMES THREE, USING LEFT INTERNAL MAMMARY ARTERY AND ENDOSCOPICALLY HARVESTED BILATERAL GREATER SAPHENOUS VEINS;  Surgeon: Norita Beauvais, MD;  Location: [facility] OR;  Service: Open Heart Surgery;  Laterality: N/A;  LIMA to LAD SVG to Diag 1 SVG to OM1   JOINT REPLACEMENT     LEFT HEART CATH AND CORONARY ANGIOGRAPHY N/A 02/09/2017   Procedure: Left Heart Cath and Coronary Angiography;  Surgeon: Ronney Cola, MD;  Location: [facility] INVASIVE CV LAB;  Service: Cardiovascular;  Laterality: N/A;   TEE WITHOUT CARDIOVERSION N/A 02/10/2017   Procedure: TRANSESOPHAGEAL ECHOCARDIOGRAM (TEE);  Surgeon: Norita Beauvais, MD;  [facility] OR;  Service: Open Heart Surgery;  Laterality: N/A;   TOTAL KNEE ARTHROPLASTY Left 04/23/2015   Procedure: TOTAL KNEE ARTHROPLASTY;  Surgeon: Daris Edman, MD;  Location: [facility] ORS;  Service: Orthopedics;  Laterality: Left;   Social History   Tobacco Use   Smoking status: Former    Current packs/day: 0.00  Average packs/day: 1.5 packs/day for 35.0 years (52.5 ttl pk-yrs)    Types: Cigarettes    Start date: 04/09/1970    Quit date: 04/09/2005    Years since quitting: 18.9   Smokeless tobacco: Never  Vaping Use   Vaping status: Never Used  Substance Use Topics   Alcohol use: No   Drug use: No   Social History   Socioeconomic History   Marital status: Divorced    Spouse  name: Not on file   Number of children: 2   Years of education: 12   Highest education level: Associate degree: academic program  Occupational History   Not on file  Tobacco Use   Smoking status: Former    Current packs/day: 0.00    Average packs/day: 1.5 packs/day for 35.0 years (52.5 ttl pk-yrs)    Types: Cigarettes    Start date: 04/09/1970    Quit date: 04/09/2005    Years since quitting: 18.9   Smokeless tobacco: Never  Vaping Use   Vaping status: Never Used  Substance and Sexual Activity   Alcohol use: No   Drug use: No   Sexual activity: Yes    Birth control/protection: None  Other Topics Concern   Not on file  Social History Narrative   Not on file   Social Drivers of Health   Financial Resource Strain: Low Risk  (09/10/2023)   Overall Financial Resource Strain (CARDIA)    Difficulty of Paying Living Expenses: Not hard at all  Food Insecurity: No Food Insecurity (09/10/2023)   Hunger Vital Sign    Worried About Running Out of Food in the Last Year: Never true    Ran Out of Food in the Last Year: Never true  Transportation Needs: No Transportation Needs (09/10/2023)   PRAPARE - Administrator, Civil Service (Medical): No    Lack of Transportation (Non-Medical): No  Physical Activity: Sufficiently Active (09/10/2023)   Exercise Vital Sign    Days of Exercise per Week: 7 days    Minutes of Exercise per Session: 30 min  Stress: No Stress Concern Present (09/10/2023)   [employer] of Occupational Health - Occupational Stress Questionnaire    Feeling of Stress : Only a little  Social Connections: Moderately Integrated (09/10/2023)   Social Connection and Isolation Panel [NHANES]    Frequency of Communication with Friends and Family: More than three times a week    Frequency of Social Gatherings with Friends and Family: More than three times a week    Attends Religious Services: More than 4 times per year    Active Member of [employer] or Organizations: Yes     Attends Banker Meetings: More than 4 times per year    Marital Status: Divorced  Intimate Partner Violence: Not At Risk (09/10/2023)   Humiliation, Afraid, Rape, and Kick questionnaire    Fear of Current or Ex-Partner: No    Emotionally Abused: No    Physically Abused: No    Sexually Abused: No   Family Status  Relation Name Status   Mother  Deceased   Father  Deceased   Brother oldest Deceased   Brother youngest Deceased       old age   Neg Hx  (Not Specified)  No partnership data on file   Family History  Problem Relation Age of Onset   Hypertension Mother    Transient ischemic attack Mother    CVA Mother    Colon cancer Father  Hypertension Father    Prostate cancer Father    Diabetes Brother    Kidney cancer Neg Hx    Bladder Cancer Neg Hx    No Known Allergies    Review of Systems  All other systems reviewed and are negative.     Objective:     BP 120/68   Pulse 70   Resp 16   Ht 5\' 10"  (1.778 m)   Wt 173 lb (78.5 kg)   SpO2 99%   BMI 24.82 kg/m  BP Readings from Last 3 Encounters:  03/09/24 120/68  09/10/23 124/82  08/04/23 121/72   Wt Readings from Last 3 Encounters:  03/09/24 173 lb (78.5 kg)  09/10/23 176 lb 14.4 oz (80.2 kg)  08/04/23 170 lb (77.1 kg)      Physical Exam Constitutional:      Appearance: Normal appearance.  HENT:     Head: Normocephalic and atraumatic.     Mouth/Throat:     Mouth: Mucous membranes are moist.     Pharynx: Oropharynx is clear.  Eyes:     Extraocular Movements: Extraocular movements intact.     Conjunctiva/sclera: Conjunctivae normal.     Pupils: Pupils are equal, round, and reactive to light.  Cardiovascular:     Rate and Rhythm: Normal rate and regular rhythm.  Pulmonary:     Effort: Pulmonary effort is normal.     Breath sounds: Normal breath sounds.  Musculoskeletal:     Cervical back: No tenderness.     Right lower leg: No edema.     Left lower leg: No edema.  Lymphadenopathy:      Cervical: No cervical adenopathy.  Skin:    General: Skin is warm and dry.  Neurological:     General: No focal deficit present.     Mental Status: He is alert. Mental status is at baseline.  Psychiatric:        Mood and Affect: Mood normal.        Behavior: Behavior normal.      No results found for any visits on 03/09/24.  Last CBC Lab Results  Component Value Date   WBC 4.8 09/10/2023   HGB 13.2 09/10/2023   HCT 39.1 09/10/2023   MCV 100 (H) 09/10/2023   MCH 33.7 (H) 09/10/2023   RDW 11.1 (L) 09/10/2023   PLT 187 09/10/2023   Last metabolic panel Lab Results  Component Value Date   GLUCOSE 97 09/10/2023   NA 143 09/10/2023   K 4.4 09/10/2023   CL 103 09/10/2023   CO2 24 09/10/2023   BUN 23 09/10/2023   CREATININE 1.10 09/10/2023   EGFR 70 09/10/2023   CALCIUM  10.6 (H) 09/15/2023   PHOS 3.0 01/20/2018   PROT 8.1 09/10/2023   ALBUMIN  4.8 09/10/2023   LABGLOB 3.3 09/10/2023   AGRATIO 1.6 09/04/2022   BILITOT 0.8 09/10/2023   ALKPHOS 54 09/10/2023   AST 27 09/10/2023   ALT 24 09/10/2023   ANIONGAP 8 07/17/2018   Last lipids Lab Results  Component Value Date   CHOL 127 09/10/2023   HDL 44 09/10/2023   LDLCALC 66 09/10/2023   TRIG 89 09/10/2023   CHOLHDL 2.9 09/04/2022   Last hemoglobin A1c Lab Results  Component Value Date   HGBA1C 5.3 09/10/2023   Last thyroid functions Lab Results  Component Value Date   TSH 1.460 08/13/2017   Last vitamin D  Lab Results  Component Value Date   VD25OH 79.9 09/15/2023   Last vitamin B12  and Folate No results found for: "VITAMINB12", "FOLATE"    The ASCVD Risk score (Arnett DK, et al., 2019) failed to calculate for the following reasons:   Risk score cannot be calculated because patient has a medical history suggesting prior/existing ASCVD    Assessment & Plan:   Essential hypertension Assessment & Plan: Blood pressure stable here today, no changes made to medications.    Chronic systolic CHF  (congestive heart failure) (HCC) Assessment & Plan: Stable, following with Cardiology. Continue beta blocker, Entresto , Spironolactone  and Lasix .    Mixed hyperlipidemia Assessment & Plan: Stable, continue statin.    Rheumatoid arthritis involving multiple sites, unspecified whether rheumatoid factor present [facility]) Assessment & Plan: New medication just started, following with Dermatology and Rheumatology.      Return for already scheduled.    Rockney Cid, DO

## 2024-03-09 NOTE — Assessment & Plan Note (Signed)
 Stable, following with Cardiology. Continue beta blocker, Entresto , Spironolactone  and Lasix .

## 2024-03-09 NOTE — Assessment & Plan Note (Signed)
 Blood pressure stable here today, no changes made to medications.

## 2024-03-09 NOTE — Assessment & Plan Note (Signed)
 Stable, continue statin

## 2024-05-08 ENCOUNTER — Encounter: Payer: Self-pay | Admitting: Nurse Practitioner

## 2024-05-08 ENCOUNTER — Ambulatory Visit: Admitting: Nurse Practitioner

## 2024-05-08 VITALS — BP 110/64 | HR 69 | Resp 16 | Ht 70.0 in | Wt 155.4 lb

## 2024-05-08 DIAGNOSIS — M5432 Sciatica, left side: Secondary | ICD-10-CM

## 2024-05-08 MED ORDER — CELECOXIB 100 MG PO CAPS: 100.0000 mg | ORAL_CAPSULE | Freq: Two times a day (BID) | ORAL | 0 refills | Status: AC

## 2024-05-08 MED ORDER — TRIAMCINOLONE ACETONIDE 40 MG/ML IJ SUSP
40.0000 mg | Freq: Once | INTRAMUSCULAR | Status: AC
  Administered 2024-05-08: 40 mg via INTRAMUSCULAR

## 2024-05-08 MED ORDER — PREDNISONE 10 MG (21) PO TBPK: ORAL_TABLET | ORAL | 0 refills | Status: AC

## 2024-05-08 MED ORDER — KETOROLAC TROMETHAMINE 60 MG/2ML IM SOLN
60.0000 mg | Freq: Once | INTRAMUSCULAR | Status: AC
  Administered 2024-05-08: 60 mg via INTRAMUSCULAR

## 2024-05-08 MED ORDER — TIZANIDINE HCL 4 MG PO TABS: 4.0000 mg | ORAL_TABLET | Freq: Three times a day (TID) | ORAL | 0 refills | Status: AC | PRN

## 2024-05-08 NOTE — Progress Notes (Addendum)
 BP 110/64   Pulse 69   Resp 16   Ht 5' 10 (1.778 m)   Wt 155 lb 6.4 oz (70.5 kg)   SpO2 98%   BMI 22.30 kg/m    Subjective:    Patient ID: Seth Byrd, male    DOB: 1946-09-02, 78 y.o.   MRN: [id number]  HPI: Seth Byrd is a 78 y.o. male  Chief Complaint  Patient presents with   Sciatica    L Sciatica, started sat getting worst    Discussed the use of AI scribe software for clinical note transcription with the patient, who gave verbal consent to proceed.  History of Present Illness Seth Byrd is a 78 year old male who presents with left-sided sciatica pain.  He has left-sided sciatica pain that radiates down his left side, described as shooting pain. The pain began intensifying on Saturday without any specific incident triggering it. It is localized to the left side and does extend below the knee. No numbness or tingling is present. He reports pain exacerbated by movement.  He has taken tylenol  for pain and it helped slightly.   He has a history of back surgery with screw placement on each side at the bottom of his back. No issues with urination.  He is currently managing the pain with Tylenol  and topical treatments. No allergies to medications.         05/08/2024    7:59 AM 09/10/2023    7:57 AM 03/09/2023    8:30 AM  Depression screen PHQ 2/9  Decreased Interest 0 0 0  Down, Depressed, Hopeless 0 0 0  PHQ - 2 Score 0 0 0  Altered sleeping   0  Tired, decreased energy   0  Change in appetite   0  Feeling bad or failure about yourself    0  Trouble concentrating   0  Moving slowly or fidgety/restless   0  Suicidal thoughts   0  PHQ-9 Score   0  Difficult doing work/chores   Not difficult at all    Relevant past medical, surgical, family and social history reviewed and updated as indicated. Interim medical history since our last visit reviewed. Allergies and medications reviewed and updated.  Review of Systems  Ten systems reviewed and is  negative except as mentioned in HPI      Objective:     BP 110/64   Pulse 69   Resp 16   Ht 5' 10 (1.778 m)   Wt 155 lb 6.4 oz (70.5 kg)   SpO2 98%   BMI 22.30 kg/m    Wt Readings from Last 3 Encounters:  05/08/24 155 lb 6.4 oz (70.5 kg)  03/09/24 173 lb (78.5 kg)  09/10/23 176 lb 14.4 oz (80.2 kg)    Physical Exam Physical Exam GENERAL: Alert, cooperative, well developed, no acute distress. HEENT: Normocephalic, normal oropharynx, moist mucous membranes. CHEST: Clear to auscultation bilaterally, no wheezes, rhonchi, or crackles. CARDIOVASCULAR: Normal heart rate and rhythm, S1 and S2 normal without murmurs. ABDOMEN: Soft, non-tender, non-distended, without organomegaly, normal bowel sounds. EXTREMITIES: No cyanosis or edema. MUSCULOSKELETAL: no joint tenderness, no spinal tenderness NEUROLOGICAL: Cranial nerves grossly intact, moves all extremities without gross motor or sensory deficit.   Results for orders placed or performed in visit on 02/01/24  PSA   Collection Time: 02/01/24  1:37 PM  Result Value Ref Range   Prostate Specific Ag, Serum 7.5 (H) 0.0 - 4.0 ng/mL  Assessment & Plan:   Problem List Items Addressed This Visit   None Visit Diagnoses       Sciatica of left side    -  Primary   Relevant Medications   ketorolac (TORADOL) injection 60 mg   triamcinolone acetonide (KENALOG-40) injection 40 mg   predniSONE (STERAPRED UNI-PAK 21 TAB) 10 MG (21) TBPK tablet   celecoxib  (CELEBREX ) 100 MG capsule   tiZANidine (ZANAFLEX) 4 MG tablet        Assessment and Plan Assessment & Plan Acute left-sided sciatica Pain radiating down the side, not extending below the knee. No numbness or tingling. Pain exacerbated by muscle flexion, likely due to nerve inflammation. No recent trauma or injury. - Administer Toradol injection for pain relief. - Administer Kenalog injection for inflammation reduction. - Prescribe muscle relaxant to alleviate muscle  tension. - Prescribe pain medication celebrex  2 times a day - Prescribe a steroid taper starting on 05/09/2024. - Provide exercises to alleviate pain. - Provide a work note to excuse from work on 05/09/2024 and return on 05/10/2024. - Instruct to report if pain persists, if no improvement will place referral for PT        Follow up plan: Return if symptoms worsen or fail to improve.

## 2024-05-08 NOTE — Patient Instructions (Addendum)
  VISIT SUMMARY: You came in today because of left-sided sciatica pain that has been getting worse since Saturday. The pain is shooting down your left side does go below your knee. You have a history of back surgery, but there are no issues with urination, numbness, or tingling.   YOUR PLAN: -ACUTE LEFT-SIDED SCIATICA: Sciatica is pain that radiates along the path of the sciatic nerve, which runs down one or both legs from the lower back. Your pain is likely due to nerve inflammation. Today, you received a Toradol injection for pain relief and a Kenalog injection to reduce inflammation. You were also prescribed a muscle relaxant to help with muscle tension, pain medication celebrex  to use 2 times a day, and a steroid taper starting on 05/09/2024. Additionally, you were given exercises to help alleviate the pain. Please take a day off work on 05/09/2024 and return on 05/10/2024. If the pain persists, report back to us .   INSTRUCTIONS: Start the steroid taper on 05/09/2024. Take the prescribed pain medication 2 times a day. Muscle relaxer every 8 hours as needed.  Follow the exercises provided to help alleviate the pain. Take a day off work on 05/09/2024 and return on 05/10/2024. Report back if the pain persists.

## 2024-05-25 ENCOUNTER — Encounter: Payer: Self-pay | Admitting: Urology

## 2024-08-01 ENCOUNTER — Other Ambulatory Visit: Payer: Self-pay

## 2024-08-01 DIAGNOSIS — C61 Malignant neoplasm of prostate: Secondary | ICD-10-CM

## 2024-08-02 LAB — PSA: Prostate Specific Ag, Serum: 6.1 ng/mL — ABNORMAL HIGH (ref 0.0–4.0)

## 2024-08-03 ENCOUNTER — Ambulatory Visit: Payer: Self-pay | Admitting: Urology

## 2024-08-07 ENCOUNTER — Ambulatory Visit: Admitting: Urology

## 2024-08-07 ENCOUNTER — Encounter: Payer: Self-pay | Admitting: Urology

## 2024-08-07 VITALS — BP 125/61 | HR 71 | Ht 70.0 in | Wt 174.0 lb

## 2024-08-07 DIAGNOSIS — C61 Malignant neoplasm of prostate: Secondary | ICD-10-CM

## 2024-08-07 NOTE — Progress Notes (Signed)
 08/07/2024 2:53 PM   Seth Byrd [birth year] [id number]  Referring provider: Gareth Mliss FALCON, FNP [address] [place],  [place] [address]  Chief Complaint  Patient presents with   Prostate Cancer   Urologic history: 1. 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 PSA 4.8, volume 36 g                         elected active surveillance.   Confirmatory biopsy December 2018 1/12 core positive Gleason 3+3 right base involving 1%  PSA 12/23/2021 with slight bump 5.3; repeat 02/23/2022 was 5.6.  MRI performed 04/08/2022 which showed a PI-RADS 3 lesion right anterior mid gland and prostate volume 37 cc. Repeat MR fusion 08/18/22 for PSA 6.0. Underwent biopsy of the PI-RADS 3 lesions, which was a benign. Standard template biopsy was not performed.  HPI: Seth Byrd is a 78 y.o. male who presents for annual follow-up.  No problems since last visit No bothersome LUTS Denies dysuria, gross hematuria Denies flank, abdominal or pelvic pain PSA 08/01/24 stable at 6.1   PMH: Past Medical History:  Diagnosis Date   Arthritis    Cancer (HCC)    CHF (congestive heart failure) (HCC)    Hypertension    Myocardial infarction [facility])    Prostate cancer (HCC) 10/19/2017    Surgical History: Past Surgical History:  Procedure Laterality Date   BACK SURGERY     Spinal Fusion with 2 screws   COLONOSCOPY WITH PROPOFOL  N/A 10/31/2021   Procedure: COLONOSCOPY WITH PROPOFOL ;  Surgeon: Janalyn Keene NOVAK, MD;  Location: [facility] ENDOSCOPY;  Service: Endoscopy;  Laterality: N/A;   CORONARY ARTERY BYPASS GRAFT N/A 02/10/2017   Procedure: CORONARY ARTERY BYPASS GRAFTING (CABG), ON PUMP, TIMES THREE, USING LEFT INTERNAL MAMMARY ARTERY AND ENDOSCOPICALLY HARVESTED BILATERAL GREATER SAPHENOUS VEINS;  Surgeon: Dallas NOVAK Jude, MD;  Location: [facility] OR;  Service: Open Heart Surgery;  Laterality: N/A;  LIMA  to LAD SVG to Diag 1 SVG to OM1   JOINT REPLACEMENT     LEFT HEART CATH AND CORONARY ANGIOGRAPHY N/A 02/09/2017   Procedure: Left Heart Cath and Coronary Angiography;  Surgeon: Vinie DELENA Jude, MD;  Location: [facility] INVASIVE CV LAB;  Service: Cardiovascular;  Laterality: N/A;   TEE WITHOUT CARDIOVERSION N/A 02/10/2017   Procedure: TRANSESOPHAGEAL ECHOCARDIOGRAM (TEE);  Surgeon: Dallas NOVAK Jude, MD;  Location: [facility] OR;  Service: Open Heart Surgery;  Laterality: N/A;   TOTAL KNEE ARTHROPLASTY Left 04/23/2015   Procedure: TOTAL KNEE ARTHROPLASTY;  Surgeon: Lonni Sharps, MD;  Location: [facility] ORS;  Service: Orthopedics;  Laterality: Left;    Home Medications:  Allergies as of 08/07/2024   No Known Allergies      Medication List        Accurate as of August 07, 2024  2:53 PM. If you have any questions, ask your nurse or doctor.          acetaminophen  500 MG tablet Commonly known as: TYLENOL  Take 500 mg by mouth every 6 (six) hours as needed.   aspirin  EC 81 MG tablet Take 81 mg by mouth daily.   atorvastatin  40 MG tablet Commonly known as: LIPITOR Take 1 tablet (40 mg total) by mouth daily at 6 PM.   bimekizumab-bkzx 320 MG/2ML pen Commonly known as: BIMZELX Inject into the skin.   carvedilol  12.5 MG tablet Commonly known as: COREG  Take 12.5 mg  by mouth 2 (two) times daily with a meal.   celecoxib  100 MG capsule Commonly known as: CeleBREX  Take 1 capsule (100 mg total) by mouth 2 (two) times daily.   CeraVe Crea Apply topically.   clindamycin 1 % external solution Commonly known as: CLEOCIN T   clobetasol  0.05 % external solution Commonly known as: TEMOVATE  APPLY TO SCALP DAILY   cyanocobalamin  1000 MCG tablet Commonly known as: VITAMIN B12 Take 1,000 mcg by mouth daily.   Entresto  49-51 MG Generic drug: sacubitril -valsartan  TK 1 T PO BID   Fish Oil 1200 MG Caps Take 1 capsule by mouth 2 (two) times daily.   furosemide  40 MG tablet Commonly known as:  LASIX  Take 1 tablet (40 mg total) by mouth daily.   ketoconazole  2 % cream Commonly known as: NIZORAL    ONE-A-DAY MENS 50+ ADVANTAGE PO Take 1 tablet by mouth daily.   predniSONE  10 MG (21) Tbpk tablet Commonly known as: STERAPRED UNI-PAK 21 TAB Start on 7/1 ( Tuesday) Take as directed on package.  (60 mg po on day 1, 50 mg po on day 2...)   spironolactone  25 MG tablet Commonly known as: ALDACTONE  Take 1 tablet (25 mg total) by mouth daily.   tiZANidine  4 MG tablet Commonly known as: ZANAFLEX  Take 1 tablet (4 mg total) by mouth every 8 (eight) hours as needed for muscle spasms.        Allergies: No Known Allergies  Family History: Family History  Problem Relation Age of Onset   Hypertension Mother    Transient ischemic attack Mother    CVA Mother    Colon cancer Father    Hypertension Father    Prostate cancer Father    Diabetes Brother    Kidney cancer Neg Hx    Bladder Cancer Neg Hx     Social History:  reports that he quit smoking about 19 years ago. His smoking use included cigarettes. He started smoking about 54 years ago. He has a 52.5 pack-year smoking history. He has never used smokeless tobacco. He reports that he does not drink alcohol and does not use drugs.   Physical Exam: BP 125/61   Pulse 71   Ht 5' 10 (1.778 m)   Wt 174 lb (78.9 kg)   BMI 24.97 kg/m   Constitutional:  Alert, No acute distress. HEENT: [place] AT Respiratory: Normal respiratory effort, no increased work of breathing. GU: Prostate 40 g, smooth without nodules Psychiatric: Normal mood and affect.   Assessment & Plan:    1.  T1c low risk prostate cancer Stable PSA; benign DRE Desire to continue active surveillance Lab visit 6 months PSA Office visit 1 year with PSA/DRE   [name], MD  [facility] [address], [address] [place], [place] [address] [phone]

## 2024-09-11 ENCOUNTER — Encounter: Payer: Managed Care, Other (non HMO) | Admitting: Nurse Practitioner

## 2024-09-15 ENCOUNTER — Ambulatory Visit (INDEPENDENT_AMBULATORY_CARE_PROVIDER_SITE_OTHER): Payer: Self-pay | Admitting: Nurse Practitioner

## 2024-09-15 ENCOUNTER — Encounter: Payer: Self-pay | Admitting: Nurse Practitioner

## 2024-09-15 VITALS — BP 130/70 | HR 54 | Temp 98.7°F | Ht 70.0 in | Wt 157.0 lb

## 2024-09-15 DIAGNOSIS — Z0000 Encounter for general adult medical examination without abnormal findings: Secondary | ICD-10-CM | POA: Diagnosis not present

## 2024-09-15 DIAGNOSIS — I10 Essential (primary) hypertension: Secondary | ICD-10-CM | POA: Diagnosis not present

## 2024-09-15 DIAGNOSIS — E782 Mixed hyperlipidemia: Secondary | ICD-10-CM

## 2024-09-15 DIAGNOSIS — Z1211 Encounter for screening for malignant neoplasm of colon: Secondary | ICD-10-CM

## 2024-09-15 DIAGNOSIS — Z131 Encounter for screening for diabetes mellitus: Secondary | ICD-10-CM

## 2024-09-15 NOTE — Progress Notes (Signed)
 Name: Seth Byrd   MRN: [id number]    DOB: 1946/04/01   Date:09/15/2024       Progress Note  Subjective  Chief Complaint  Chief Complaint  Patient presents with   Annual Exam    HPI  Patient presents for annual CPE . Discussed the use of AI scribe software for clinical note transcription with the patient, who gave verbal consent to proceed.  History of Present Illness Seth Byrd is a 78 year old male who presents for an annual physical exam.  General health status - Feels generally well with no new complaints - No pain or discomfort - Blood pressure and weight are stable - Feels good overall  Prostate cancer management - Currently managed by urology for prostate cancer - Last PSA recorded at 6.1 - No current symptoms related to prostate cancer management  Preventive health maintenance - Due for routine laboratory work - Colonoscopy scheduled for December  Lifestyle and sleep patterns - Maintains a well-balanced diet - Engages in regular physical activity, primarily walking - Obtains six to seven hours of sleep per night - Naps after work sometimes disrupt sleep schedule    IPSS     Row Name 09/15/24 0814         International Prostate Symptom Score   How often have you had to urinate less than every two hours? Less than 1 in 5 times     How often have you found you stopped and started again several times when you urinated? Less than 1 in 5 times     How often have you found it difficult to postpone urination? Less than 1 in 5 times     How often have you had a weak urinary stream? Less than 1 in 5 times     How often have you had to strain to start urination? Less than 1 in 5 times     How many times did you typically get up at night to urinate? None     Total IPSS Score 5       Quality of Life due to urinary symptoms   If you were to spend the rest of your life with your urinary condition just the way it is now how would you feel about that?  Delighted         Diet: well balanced diet Exercise: exercise daily bike, walk Sleep: 6-7 hours Last dental exam:unknown Last eye exam: 08/2024  Depression: phq 9 is negative    09/15/2024    8:07 AM 05/08/2024    7:59 AM 09/10/2023    7:57 AM 03/09/2023    8:30 AM 09/04/2022    8:19 AM  Depression screen PHQ 2/9  Decreased Interest 0 0 0 0 0  Down, Depressed, Hopeless 0 0 0 0 0  PHQ - 2 Score 0 0 0 0 0  Altered sleeping    0   Tired, decreased energy    0   Change in appetite    0   Feeling bad or failure about yourself     0   Trouble concentrating    0   Moving slowly or fidgety/restless    0   Suicidal thoughts    0   PHQ-9 Score    0    Difficult doing work/chores    Not difficult at all      Data saved with a previous flowsheet row definition    Hypertension:  BP Readings from Last 3  Encounters:  09/15/24 130/70  08/07/24 125/61  05/08/24 110/64    Obesity: Wt Readings from Last 3 Encounters:  09/15/24 157 lb (71.2 kg)  08/07/24 174 lb (78.9 kg)  05/08/24 155 lb 6.4 oz (70.5 kg)   BMI Readings from Last 3 Encounters:  09/15/24 22.53 kg/m  08/07/24 24.97 kg/m  05/08/24 22.30 kg/m     IPSS     Row Name 09/15/24 9185         International Prostate Symptom Score   How often have you had to urinate less than every two hours? Less than 1 in 5 times     How often have you found you stopped and started again several times when you urinated? Less than 1 in 5 times     How often have you found it difficult to postpone urination? Less than 1 in 5 times     How often have you had a weak urinary stream? Less than 1 in 5 times     How often have you had to strain to start urination? Less than 1 in 5 times     How many times did you typically get up at night to urinate? None     Total IPSS Score 5       Quality of Life due to urinary symptoms   If you were to spend the rest of your life with your urinary condition just the way it is now how would you feel  about that? Delighted         Lipids:  Lab Results  Component Value Date   CHOL 127 09/10/2023   CHOL 115 09/04/2022   CHOL 112 08/29/2021   Lab Results  Component Value Date   HDL 44 09/10/2023   HDL 40 09/04/2022   HDL 33 (L) 08/29/2021   Lab Results  Component Value Date   LDLCALC 66 09/10/2023   LDLCALC 58 09/04/2022   LDLCALC 61 08/29/2021   Lab Results  Component Value Date   TRIG 89 09/10/2023   TRIG 86 09/04/2022   TRIG 92 08/29/2021   Lab Results  Component Value Date   CHOLHDL 2.9 09/04/2022   CHOLHDL 3.4 08/29/2021   CHOLHDL 3.4 08/26/2020   No results found for: LDLDIRECT Glucose:  Glucose  Date Value Ref Range Status  09/10/2023 97 70 - 99 mg/dL Final  89/72/7976 94 70 - 99 mg/dL Final  89/78/7977 895 (H) 70 - 99 mg/dL Final   Glucose, Bld  Date Value Ref Range Status  07/17/2018 141 (H) 70 - 99 mg/dL Final  93/74/7981 891 (H) 65 - 99 mg/dL Final  95/80/7981 90 65 - 99 mg/dL Final   Glucose-Capillary  Date Value Ref Range Status  07/17/2018 132 (H) 70 - 99 mg/dL Final  95/88/7981 849 (H) 65 - 99 mg/dL Final  95/88/7981 90 65 - 99 mg/dL Final    Flowsheet Row Office Visit from 09/15/2024 in [facility]  AUDIT-C Score 0     Divorced STD testing and prevention (HIV/chl/gon/syphilis): completed Hep C: completed  Skin cancer: Discussed monitoring for atypical lesions Colorectal cancer: 10/31/2021 Prostate cancer: 6.1, currently being managed by urology for prostate cancer No results found for: PSA   Lung cancer:   Low Dose CT Chest recommended if Age 46-80 years, 30 pack-year currently smoking OR have quit w/in 15years. Patient does not qualify.   AAA:  The USPSTF recommends one-time screening with ultrasonography in men ages 42 to 70 years who  have ever smoked ECG:  07/18/2018  Vaccines:  HPV: up to at age 15 , ask insurance if age between 7-45  Shingrix: 51-64 yo and ask insurance if covered when  patient above 3 yo Pneumonia:  educated and discussed with patient. Flu:  educated and discussed with patient.  Advanced Care Planning: A voluntary discussion about advance care planning including the explanation and discussion of advance directives.  Discussed health care proxy and Living will, and the patient was able to identify a health care proxy as daughters.  Patient does not have a living will at present time. If patient does have living will, I have requested they bring this to the clinic to be scanned in to their chart.  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   History of heart attack 02/09/2017   Essential hypertension 11/25/2015   Hyperlipidemia 11/25/2015   Panlobular emphysema (HCC) 11/25/2015   Status post total left knee replacement 11/25/2015   Primary osteoarthritis involving multiple joints 11/25/2015   Primary osteoarthritis of knee 04/23/2015    Past Surgical History:  Procedure Laterality Date   BACK SURGERY     Spinal Fusion with 2 screws   COLONOSCOPY WITH PROPOFOL  N/A 10/31/2021   Procedure: COLONOSCOPY WITH PROPOFOL ;  Surgeon: Janalyn Keene NOVAK, MD;  Location: [facility] ENDOSCOPY;  Service: Endoscopy;  Laterality: N/A;   CORONARY ARTERY BYPASS GRAFT N/A 02/10/2017   Procedure: CORONARY ARTERY BYPASS GRAFTING (CABG), ON PUMP, TIMES THREE, USING LEFT INTERNAL MAMMARY ARTERY AND ENDOSCOPICALLY HARVESTED BILATERAL GREATER SAPHENOUS VEINS;  Surgeon: Dallas NOVAK Jude, MD;  Location: [facility] OR;  Service: Open Heart Surgery;  Laterality: N/A;  LIMA to LAD SVG to Diag 1 SVG to OM1   JOINT REPLACEMENT     LEFT HEART CATH AND CORONARY ANGIOGRAPHY N/A  02/09/2017   Procedure: Left Heart Cath and Coronary Angiography;  Surgeon: Vinie DELENA Jude, MD;  Location: [facility] INVASIVE CV LAB;  Service: Cardiovascular;  Laterality: N/A;   TEE WITHOUT CARDIOVERSION N/A 02/10/2017   Procedure: TRANSESOPHAGEAL ECHOCARDIOGRAM (TEE);  Surgeon: Dallas NOVAK Jude, MD;  Location: [facility] OR;  Service: Open Heart Surgery;  Laterality: N/A;   TOTAL KNEE ARTHROPLASTY Left 04/23/2015   Procedure: TOTAL KNEE ARTHROPLASTY;  Surgeon: Lonni Sharps, MD;  Location: [facility] ORS;  Service: Orthopedics;  Laterality: Left;    Family History  Problem Relation Age of Onset   Hypertension Mother    Transient ischemic attack Mother    CVA Mother    Colon cancer Father    Hypertension Father    Prostate cancer Father    Diabetes Brother    Kidney cancer Neg Hx    Bladder Cancer Neg Hx     Social History   Socioeconomic History   Marital status: Divorced    Spouse name: Not on file   Number of children: 2   Years of education: 12   Highest education level: Associate degree: academic program  Occupational History   Not on file  Tobacco Use   Smoking status: Former    Current packs/day: 0.00    Average packs/day: 1.5 packs/day for 35.0 years (52.5 ttl pk-yrs)    Types: Cigarettes    Start date: 04/09/1970  Quit date: 04/09/2005    Years since quitting: 19.4   Smokeless tobacco: Never  Vaping Use   Vaping status: Never Used  Substance and Sexual Activity   Alcohol use: No   Drug use: No   Sexual activity: Yes    Birth control/protection: None  Other Topics Concern   Not on file  Social History Narrative   Not on file   Social Drivers of Health   Financial Resource Strain: Low Risk  (09/15/2024)   Overall Financial Resource Strain (CARDIA)    Difficulty of Paying Living Expenses: Not hard at all  Food Insecurity: No Food Insecurity (09/15/2024)   Hunger Vital Sign    Worried About Running Out of Food in the Last Year: Never true    Ran Out of Food in the Last  Year: Never true  Transportation Needs: No Transportation Needs (09/15/2024)   PRAPARE - Administrator, Civil Service (Medical): No    Lack of Transportation (Non-Medical): No  Physical Activity: Sufficiently Active (09/15/2024)   Exercise Vital Sign    Days of Exercise per Week: 7 days    Minutes of Exercise per Session: 30 min  Stress: No Stress Concern Present (09/15/2024)   [employer] of Occupational Health - Occupational Stress Questionnaire    Feeling of Stress: Not at all  Social Connections: Moderately Integrated (09/15/2024)   Social Connection and Isolation Panel    Frequency of Communication with Friends and Family: More than three times a week    Frequency of Social Gatherings with Friends and Family: More than three times a week    Attends Religious Services: More than 4 times per year    Active Member of [employer] or Organizations: Yes    Attends Engineer, Structural: More than 4 times per year    Marital Status: Divorced  Intimate Partner Violence: Not At Risk (09/15/2024)   Humiliation, Afraid, Rape, and Kick questionnaire    Fear of Current or Ex-Partner: No    Emotionally Abused: No    Physically Abused: No    Sexually Abused: No     Current Outpatient Medications:    acetaminophen  (TYLENOL ) 500 MG tablet, Take 500 mg by mouth every 6 (six) hours as needed., Disp: , Rfl:    aspirin  EC 81 MG tablet, Take 81 mg by mouth daily., Disp: , Rfl:    atorvastatin  (LIPITOR) 40 MG tablet, Take 1 tablet (40 mg total) by mouth daily at 6 PM., Disp: , Rfl:    bimekizumab-bkzx (BIMZELX) 320 MG/2ML pen, Inject into the skin., Disp: , Rfl:    carvedilol  (COREG ) 12.5 MG tablet, Take 12.5 mg by mouth 2 (two) times daily with a meal., Disp: , Rfl: 3   celecoxib  (CELEBREX ) 100 MG capsule, Take 1 capsule (100 mg total) by mouth 2 (two) times daily., Disp: 30 capsule, Rfl: 0   clindamycin (CLEOCIN T) 1 % external solution, , Disp: , Rfl:    clobetasol  (TEMOVATE )  0.05 % external solution, APPLY TO SCALP DAILY, Disp: , Rfl: 5   Emollient (CERAVE) CREA, Apply topically., Disp: , Rfl:    ENTRESTO  49-51 MG, TK 1 T PO BID, Disp: , Rfl:    furosemide  (LASIX ) 40 MG tablet, Take 1 tablet (40 mg total) by mouth daily., Disp: 30 tablet, Rfl:    ketoconazole  (NIZORAL ) 2 % cream, , Disp: , Rfl:    Multiple Vitamins-Minerals (ONE-A-DAY MENS 50+ ADVANTAGE PO), Take 1 tablet by mouth daily., Disp: , Rfl:  Omega-3 Fatty Acids (FISH OIL) 1200 MG CAPS, Take 1 capsule by mouth 2 (two) times daily. , Disp: , Rfl:    predniSONE  (STERAPRED UNI-PAK 21 TAB) 10 MG (21) TBPK tablet, Start on 7/1 ( Tuesday) Take as directed on package.  (60 mg po on day 1, 50 mg po on day 2...), Disp: 21 tablet, Rfl: 0   spironolactone  (ALDACTONE ) 25 MG tablet, Take 1 tablet (25 mg total) by mouth daily., Disp: , Rfl:    tiZANidine  (ZANAFLEX ) 4 MG tablet, Take 1 tablet (4 mg total) by mouth every 8 (eight) hours as needed for muscle spasms., Disp: 30 tablet, Rfl: 0   vitamin B-12 (CYANOCOBALAMIN ) 1000 MCG tablet, Take 1,000 mcg by mouth daily. , Disp: , Rfl:   No Known Allergies   ROS  Constitutional: Negative for fever or weight change.  Respiratory: Negative for cough and shortness of breath.   Cardiovascular: Negative for chest pain or palpitations.  Gastrointestinal: Negative for abdominal pain, no bowel changes.  Musculoskeletal: Negative for gait problem or joint swelling.  Skin: Negative for rash.  Neurological: Negative for dizziness or headache.  No other specific complaints in a complete review of systems (except as listed in HPI above).    Objective  Vitals:   09/15/24 0758  BP: 130/70  Pulse: (!) 54  Temp: 98.7 F (37.1 C)  SpO2: 98%  Weight: 157 lb (71.2 kg)  Height: 5' 10 (1.778 m)    Body mass index is 22.53 kg/m.  Physical Exam Vitals reviewed.  Constitutional:      Appearance: Normal appearance.  HENT:     Head: Normocephalic.     Right Ear: Tympanic  membrane normal.     Left Ear: Tympanic membrane normal.     Nose: Nose normal.  Eyes:     Extraocular Movements: Extraocular movements intact.     Conjunctiva/sclera: Conjunctivae normal.     Pupils: Pupils are equal, round, and reactive to light.  Neck:     Thyroid: No thyroid mass, thyromegaly or thyroid tenderness.  Cardiovascular:     Rate and Rhythm: Normal rate and regular rhythm.     Pulses: Normal pulses.     Heart sounds: Normal heart sounds.  Pulmonary:     Effort: Pulmonary effort is normal.     Breath sounds: Normal breath sounds.  Abdominal:     General: Bowel sounds are normal.     Palpations: Abdomen is soft.  Musculoskeletal:        General: Normal range of motion.     Cervical back: Normal range of motion and neck supple.     Right lower leg: No edema.     Left lower leg: No edema.  Skin:    General: Skin is warm and dry.     Capillary Refill: Capillary refill takes less than 2 seconds.  Neurological:     General: No focal deficit present.     Mental Status: He is alert and oriented to person, place, and time. Mental status is at baseline.  Psychiatric:        Mood and Affect: Mood normal.        Behavior: Behavior normal.        Thought Content: Thought content normal.        Judgment: Judgment normal.      Recent Results (from the past 2160 hours)  PSA     Status: Abnormal   Collection Time: 08/01/24 11:00 AM  Result Value Ref Range  Prostate Specific Ag, Serum 6.1 (H) 0.0 - 4.0 ng/mL    Comment: Roche ECLIA methodology. According to the American Urological Association, Serum PSA should decrease and remain at undetectable levels after radical prostatectomy. The AUA defines biochemical recurrence as an initial PSA value 0.2 ng/mL or greater followed by a subsequent confirmatory PSA value 0.2 ng/mL or greater. Values obtained with different assay methods or kits cannot be used interchangeably. Results cannot be interpreted as absolute evidence of  the presence or absence of malignant disease.      Fall Risk:    05/08/2024    7:59 AM 09/10/2023    7:57 AM 03/09/2023    8:30 AM 09/04/2022    8:19 AM 08/29/2021    8:22 AM  Fall Risk   Falls in the past year? 0 0 0 0 0  Number falls in past yr: 0 0 0 0 0  Injury with Fall? 0 0 0 0 0  Risk for fall due to : No Fall Risks No Fall Risks No Fall Risks    Follow up Falls prevention discussed;Education provided;Falls evaluation completed Falls prevention discussed Falls prevention discussed;Education provided;Falls evaluation completed Falls evaluation completed  Falls evaluation completed      Data saved with a previous flowsheet row definition      Functional Status Survey: Is the patient deaf or have difficulty hearing?: No Does the patient have difficulty seeing, even when wearing glasses/contacts?: No Does the patient have difficulty concentrating, remembering, or making decisions?: No Does the patient have difficulty walking or climbing stairs?: No Does the patient have difficulty dressing or bathing?: No Does the patient have difficulty doing errands alone such as visiting a doctor's office or shopping?: No    Assessment & Plan  Problem List Items Addressed This Visit       Cardiovascular and Mediastinum   Essential hypertension (Chronic)   Relevant Orders   CBC with Differential/Platelet   Comprehensive metabolic panel with GFR     Other   Hyperlipidemia (Chronic)   Relevant Orders   Lipid panel   Other Visit Diagnoses       Annual physical exam    -  Primary   Relevant Orders   Ambulatory referral to Gastroenterology   CBC with Differential/Platelet   Comprehensive metabolic panel with GFR   Hemoglobin A1c   Lipid panel     Screening for diabetes mellitus       Relevant Orders   Comprehensive metabolic panel with GFR   Hemoglobin A1c     Screening for colon cancer       Relevant Orders   Ambulatory referral to Gastroenterology      Assessment  and Plan Assessment & Plan Adult Wellness Visit Annual physical examination conducted. Depression screening negative. Blood pressure and weight are within normal limits. Reports a well-balanced diet and regular walking for exercise. Sleep is generally adequate, though occasionally disrupted by work schedule. Last dental exam was overdue, and last eye exam was in October. - Ordered blood work - Encouraged continuation of balanced diet and regular exercise  Essential hypertension Blood pressure is well-controlled.  Mixed hyperlipidemia No specific discussion regarding current lipid levels or management during this visit.  Prostate cancer Managed by urology. Last PSA was 6.1.  Screening for malignant neoplasm of colon Due for colonoscopy in December. - Placed referral to GI for colonoscopy     -Prostate cancer screening and PSA options (with potential risks and benefits of testing vs not testing) were discussed  along with recent recs/guidelines. -USPSTF grade A and B recommendations reviewed with patient; age-appropriate recommendations, preventive care, screening tests, etc discussed and encouraged; healthy living encouraged; see AVS for patient education given to patient -Discussed importance of 150 minutes of physical activity weekly, eat two servings of fish weekly, eat one serving of tree nuts ( cashews, pistachios, pecans, almonds.SABRA) every other day, eat 6 servings of fruit/vegetables daily and drink plenty of water and avoid sweet beverages.  -Reviewed Health Maintenance: yes

## 2024-09-16 LAB — CBC WITH DIFFERENTIAL/PLATELET
Basophils Absolute: 0 x10E3/uL (ref 0.0–0.2)
Basos: 1 %
EOS (ABSOLUTE): 0.2 x10E3/uL (ref 0.0–0.4)
Eos: 4 %
Hematocrit: 33.1 % — ABNORMAL LOW (ref 37.5–51.0)
Hemoglobin: 10.9 g/dL — ABNORMAL LOW (ref 13.0–17.7)
Immature Grans (Abs): 0 x10E3/uL (ref 0.0–0.1)
Immature Granulocytes: 0 %
Lymphocytes Absolute: 1.8 x10E3/uL (ref 0.7–3.1)
Lymphs: 44 %
MCH: 32.3 pg (ref 26.6–33.0)
MCHC: 32.9 g/dL (ref 31.5–35.7)
MCV: 98 fL — ABNORMAL HIGH (ref 79–97)
Monocytes Absolute: 0.4 x10E3/uL (ref 0.1–0.9)
Monocytes: 10 %
Neutrophils Absolute: 1.7 x10E3/uL (ref 1.4–7.0)
Neutrophils: 41 %
Platelets: 145 x10E3/uL — ABNORMAL LOW (ref 150–450)
RBC: 3.37 x10E6/uL — ABNORMAL LOW (ref 4.14–5.80)
RDW: 10.8 % — ABNORMAL LOW (ref 11.6–15.4)
WBC: 4.1 x10E3/uL (ref 3.4–10.8)

## 2024-09-16 LAB — COMPREHENSIVE METABOLIC PANEL WITH GFR
ALT: 16 IU/L (ref 0–44)
AST: 20 IU/L (ref 0–40)
Albumin: 4.3 g/dL (ref 3.8–4.8)
Alkaline Phosphatase: 63 IU/L (ref 47–123)
BUN/Creatinine Ratio: 21 (ref 10–24)
BUN: 19 mg/dL (ref 8–27)
Bilirubin Total: 1.1 mg/dL (ref 0.0–1.2)
CO2: 24 mmol/L (ref 20–29)
Calcium: 10.2 mg/dL (ref 8.6–10.2)
Chloride: 102 mmol/L (ref 96–106)
Creatinine, Ser: 0.91 mg/dL (ref 0.76–1.27)
Globulin, Total: 3.1 g/dL (ref 1.5–4.5)
Glucose: 87 mg/dL (ref 70–99)
Potassium: 4.7 mmol/L (ref 3.5–5.2)
Sodium: 142 mmol/L (ref 134–144)
Total Protein: 7.4 g/dL (ref 6.0–8.5)
eGFR: 87 mL/min/1.73 (ref >59)

## 2024-09-16 LAB — LIPID PANEL
Chol/HDL Ratio: 2.4 ratio (ref 0.0–5.0)
Cholesterol, Total: 112 mg/dL (ref 100–199)
HDL: 47 mg/dL (ref >39)
LDL Chol Calc (NIH): 53 mg/dL (ref 0–99)
Triglycerides: 54 mg/dL (ref 0–149)
VLDL Cholesterol Cal: 12 mg/dL (ref 5–40)

## 2024-09-16 LAB — HEMOGLOBIN A1C
Est. average glucose Bld gHb Est-mCnc: 97 mg/dL
Hgb A1c MFr Bld: 5 % (ref 4.8–5.6)

## 2024-09-18 ENCOUNTER — Ambulatory Visit: Payer: Self-pay | Admitting: Nurse Practitioner

## 2024-09-18 DIAGNOSIS — D649 Anemia, unspecified: Secondary | ICD-10-CM

## 2024-09-18 DIAGNOSIS — D696 Thrombocytopenia, unspecified: Secondary | ICD-10-CM

## 2024-09-19 ENCOUNTER — Telehealth: Payer: Self-pay

## 2024-09-19 NOTE — Telephone Encounter (Signed)
 Pt return call but not sure who called him. He states someone told him they would call him back at 3:30 to discuss with him about  colonoscopy.

## 2024-09-20 ENCOUNTER — Other Ambulatory Visit: Payer: Self-pay

## 2024-09-20 DIAGNOSIS — Z860100 Personal history of colon polyps, unspecified: Secondary | ICD-10-CM

## 2024-09-20 MED ORDER — NA SULFATE-K SULFATE-MG SULF 17.5-3.13-1.6 GM/177ML PO SOLN: 354.0000 mL | Freq: Once | ORAL | 0 refills | Status: AC

## 2024-09-20 NOTE — Telephone Encounter (Signed)
 Called patient and we went over his triage and he decided to have his colonoscopy on 10/19/2024 with Dr. Jinny at [facility]. Instructions were given and patient understood and had no further questions.

## 2024-09-20 NOTE — Telephone Encounter (Signed)
 Gastroenterology Pre-Procedure Review  Request Date: 10/19/2024 Requesting Physician: Dr. Jinny  PATIENT REVIEW QUESTIONS: The patient responded to the following health history questions as indicated:    1. Are you having any GI issues? no 2. Do you have a personal history of Polyps? yes (10/31/2021   Dr. Janalyn) 3. Do you have a family history of Colon Cancer or Polyps? no 4. Diabetes Mellitus? no 5. Joint replacements in the past 12 months?no 6. Major health problems in the past 3 months?no 7. Any artificial heart valves, MVP, or defibrillator?no    MEDICATIONS & ALLERGIES:    Patient reports the following regarding taking any anticoagulation/antiplatelet therapy:   Plavix, Coumadin, Eliquis, Xarelto, Lovenox , Pradaxa, Brilinta, or Effient? no Aspirin ? yes (81 MG daily)  Patient confirms/reports the following medications:  Current Outpatient Medications  Medication Sig Dispense Refill   Na Sulfate-K Sulfate-Mg Sulfate concentrate (SUPREP) 17.5-3.13-1.6 GM/177ML SOLN Take 1 kit (354 mLs total) by mouth once for 1 dose. Starting at 5 PM take one bottle and pour into the supplied cup, add cool water to the fill 16 oz line and drink all. Then 5 hours before procedure pour the second bottle into the supplied cup, add cool water to the fill 16 oz line and drink all. 354 mL 0   acetaminophen  (TYLENOL ) 500 MG tablet Take 500 mg by mouth every 6 (six) hours as needed.     aspirin  EC 81 MG tablet Take 81 mg by mouth daily.     atorvastatin  (LIPITOR) 40 MG tablet Take 1 tablet (40 mg total) by mouth daily at 6 PM.     bimekizumab-bkzx (BIMZELX) 320 MG/2ML pen Inject into the skin.     carvedilol  (COREG ) 12.5 MG tablet Take 12.5 mg by mouth 2 (two) times daily with a meal.  3   celecoxib  (CELEBREX ) 100 MG capsule Take 1 capsule (100 mg total) by mouth 2 (two) times daily. 30 capsule 0   clindamycin (CLEOCIN T) 1 % external solution      clobetasol  (TEMOVATE ) 0.05 % external solution APPLY TO  SCALP DAILY  5   Emollient (CERAVE) CREA Apply topically.     ENTRESTO  49-51 MG TK 1 T PO BID     furosemide  (LASIX ) 40 MG tablet Take 1 tablet (40 mg total) by mouth daily. 30 tablet    ketoconazole  (NIZORAL ) 2 % cream      Multiple Vitamins-Minerals (ONE-A-DAY MENS 50+ ADVANTAGE PO) Take 1 tablet by mouth daily.     Omega-3 Fatty Acids (FISH OIL) 1200 MG CAPS Take 1 capsule by mouth 2 (two) times daily.      predniSONE  (STERAPRED UNI-PAK 21 TAB) 10 MG (21) TBPK tablet Start on 7/1 ( Tuesday) Take as directed on package.  (60 mg po on day 1, 50 mg po on day 2...) 21 tablet 0   spironolactone  (ALDACTONE ) 25 MG tablet Take 1 tablet (25 mg total) by mouth daily.     tiZANidine  (ZANAFLEX ) 4 MG tablet Take 1 tablet (4 mg total) by mouth every 8 (eight) hours as needed for muscle spasms. 30 tablet 0   vitamin B-12 (CYANOCOBALAMIN ) 1000 MCG tablet Take 1,000 mcg by mouth daily.      No current facility-administered medications for this visit.    Patient confirms/reports the following allergies:  No Known Allergies  No orders of the defined types were placed in this encounter.   AUTHORIZATION INFORMATION Primary Insurance: 1D#: Group #:  Secondary Insurance: 1D#: Group #:  SCHEDULE INFORMATION: Date:  10/18/2024 Time: Location: [facility]   Dr. Jinny

## 2024-09-20 NOTE — Addendum Note (Signed)
 Addended by: JODIE HEADINGS on: 09/20/2024 08:27 AM   Modules accepted: Orders

## 2024-09-25 NOTE — Telephone Encounter (Signed)
 Cardiac clearance received from Dr. Keller Paterson, patient's cardiologist.

## 2024-10-19 ENCOUNTER — Ambulatory Visit
Admission: RE | Admit: 2024-10-19 | Discharge: 2024-10-19 | Disposition: A | Attending: Gastroenterology | Admitting: Gastroenterology

## 2024-10-19 ENCOUNTER — Ambulatory Visit

## 2024-10-19 ENCOUNTER — Encounter: Admission: RE | Disposition: A | Payer: Self-pay | Source: Home / Self Care | Attending: Gastroenterology

## 2024-10-19 ENCOUNTER — Encounter: Payer: Self-pay | Admitting: Gastroenterology

## 2024-10-19 ENCOUNTER — Other Ambulatory Visit: Payer: Self-pay

## 2024-10-19 DIAGNOSIS — K640 First degree hemorrhoids: Secondary | ICD-10-CM

## 2024-10-19 DIAGNOSIS — Z87891 Personal history of nicotine dependence: Secondary | ICD-10-CM | POA: Diagnosis not present

## 2024-10-19 DIAGNOSIS — K5730 Diverticulosis of large intestine without perforation or abscess without bleeding: Secondary | ICD-10-CM

## 2024-10-19 DIAGNOSIS — Z860100 Personal history of colon polyps, unspecified: Secondary | ICD-10-CM

## 2024-10-19 DIAGNOSIS — Z1211 Encounter for screening for malignant neoplasm of colon: Secondary | ICD-10-CM | POA: Diagnosis not present

## 2024-10-19 DIAGNOSIS — K635 Polyp of colon: Secondary | ICD-10-CM | POA: Diagnosis not present

## 2024-10-19 DIAGNOSIS — I252 Old myocardial infarction: Secondary | ICD-10-CM | POA: Diagnosis not present

## 2024-10-19 DIAGNOSIS — I5020 Unspecified systolic (congestive) heart failure: Secondary | ICD-10-CM | POA: Diagnosis not present

## 2024-10-19 DIAGNOSIS — I110 Hypertensive heart disease with heart failure: Secondary | ICD-10-CM | POA: Diagnosis not present

## 2024-10-19 DIAGNOSIS — I2510 Atherosclerotic heart disease of native coronary artery without angina pectoris: Secondary | ICD-10-CM | POA: Diagnosis not present

## 2024-10-19 DIAGNOSIS — Z860101 Personal history of adenomatous and serrated colon polyps: Secondary | ICD-10-CM | POA: Diagnosis not present

## 2024-10-19 DIAGNOSIS — D125 Benign neoplasm of sigmoid colon: Secondary | ICD-10-CM | POA: Diagnosis not present

## 2024-10-19 HISTORY — PX: POLYPECTOMY: SHX149

## 2024-10-19 HISTORY — PX: COLONOSCOPY: SHX5424

## 2024-10-19 SURGERY — COLONOSCOPY
Anesthesia: General

## 2024-10-19 MED ORDER — PHENYLEPHRINE 80 MCG/ML (10ML) SYRINGE FOR IV PUSH (FOR BLOOD PRESSURE SUPPORT)
PREFILLED_SYRINGE | INTRAVENOUS | Status: DC | PRN
  Administered 2024-10-19 (×2): 80 ug via INTRAVENOUS

## 2024-10-19 MED ORDER — PROPOFOL 10 MG/ML IV BOLUS
INTRAVENOUS | Status: DC | PRN
  Administered 2024-10-19: 70 mg via INTRAVENOUS
  Administered 2024-10-19: 30 mg via INTRAVENOUS

## 2024-10-19 MED ORDER — LIDOCAINE HCL (CARDIAC) PF 100 MG/5ML IV SOSY
PREFILLED_SYRINGE | INTRAVENOUS | Status: DC | PRN
  Administered 2024-10-19: 50 mg via INTRAVENOUS

## 2024-10-19 MED ORDER — SODIUM CHLORIDE 0.9 % IV SOLN: INTRAVENOUS | Status: DC

## 2024-10-19 MED ORDER — EPHEDRINE 5 MG/ML INJ
INTRAVENOUS | Status: AC
  Filled 2024-10-19: qty 5

## 2024-10-19 MED ORDER — EPHEDRINE SULFATE-NACL 50-0.9 MG/10ML-% IV SOSY
PREFILLED_SYRINGE | INTRAVENOUS | Status: DC | PRN
  Administered 2024-10-19: 5 mg via INTRAVENOUS
  Administered 2024-10-19 (×2): 10 mg via INTRAVENOUS

## 2024-10-19 MED ORDER — PROPOFOL 500 MG/50ML IV EMUL
INTRAVENOUS | Status: DC | PRN
  Administered 2024-10-19: 100 ug/kg/min via INTRAVENOUS

## 2024-10-19 NOTE — Transfer of Care (Signed)
 Immediate Anesthesia Transfer of Care Note  Patient: Seth Byrd  Procedure(s) Performed: COLONOSCOPY POLYPECTOMY, INTESTINE  Patient Location: Endoscopy Unit  Anesthesia Type:General  Level of Consciousness: awake and alert   Airway & Oxygen Therapy: Patient Spontanous Breathing  Post-op Assessment: Report given to RN and Post -op Vital signs reviewed and stable  Post vital signs: Reviewed and stable  Last Vitals:  Vitals Value Taken Time  BP 99/61 10/19/24 11:45  Temp 35.8 C 10/19/24 11:41  Pulse 79 10/19/24 11:45  Resp 17 10/19/24 11:45  SpO2 100 % 10/19/24 11:45  Vitals shown include unfiled device data.  Last Pain:  Vitals:   10/19/24 1141  TempSrc: Temporal  PainSc: 0-No pain         Complications: No notable events documented.

## 2024-10-19 NOTE — Op Note (Signed)
 [facility] Gastroenterology Patient Name: Seth Byrd Procedure Date: 10/19/2024 11:16 AM MRN: [id number] Account #: [id number] Date of Birth: 1946-03-12 Admit Type: Outpatient Age: 78 Room: [facility] ENDO ROOM 4 Gender: Male Note Status: Finalized Instrument Name: Colon Scope [id number] Procedure:             Colonoscopy Indications:           High risk colon cancer surveillance: Personal history                         of colonic polyps Providers:             Rogelia Copping MD, MD Referring MD:          No Local Md, MD (Referring MD) Medicines:             Propofol  per Anesthesia Complications:         No immediate complications. Procedure:             Pre-Anesthesia Assessment:                        - Prior to the procedure, a History and Physical was                         performed, and patient medications and allergies were                         reviewed. The patient's tolerance of previous                         anesthesia was also reviewed. The risks and benefits                         of the procedure and the sedation options and risks                         were discussed with the patient. All questions were                         answered, and informed consent was obtained. Prior                         Anticoagulants: The patient has taken no anticoagulant                         or antiplatelet agents. ASA Grade Assessment: II - A                         patient with mild systemic disease. After reviewing                         the risks and benefits, the patient was deemed in                         satisfactory condition to undergo the procedure.                        After obtaining informed consent, the colonoscope was  passed under direct vision. Throughout the procedure,                         the patient's blood pressure, pulse, and oxygen                         saturations were monitored continuously. The                          Colonoscope was introduced through the anus and                         advanced to the the cecum, identified by appendiceal                         orifice and ileocecal valve. The colonoscopy was                         performed without difficulty. The patient tolerated                         the procedure well. The quality of the bowel                         preparation was excellent. Findings:      The perianal and digital rectal examinations were normal.      A 4 mm polyp was found in the sigmoid colon. The polyp was sessile. The       polyp was removed with a cold snare. Resection and retrieval were       complete.      Non-bleeding internal hemorrhoids were found during retroflexion. The       hemorrhoids were Grade I (internal hemorrhoids that do not prolapse).      Multiple small-mouthed diverticula were found in the sigmoid colon. Impression:            - One 4 mm polyp in the sigmoid colon, removed with a                         cold snare. Resected and retrieved.                        - Non-bleeding internal hemorrhoids.                        - Diverticulosis in the sigmoid colon. Recommendation:        - Discharge patient to home.                        - Resume previous diet.                        - Continue present medications.                        - Await pathology results.                        - Repeat colonoscopy is not recommended for  surveillance. Procedure Code(s):     --- Professional ---                        [phone], Colonoscopy, flexible; with removal of                         tumor(s), polyp(s), or other lesion(s) by snare                         technique Diagnosis Code(s):     --- Professional ---                        Z86.010, Personal history of colonic polyps                        D12.5, Benign neoplasm of sigmoid colon CPT copyright 2022 American Medical Association. All rights reserved. The codes  documented in this report are preliminary and upon coder review may  be revised to meet current compliance requirements. Rogelia Copping MD, MD 10/19/2024 11:39:24 AM This report has been signed electronically. Number of Addenda: 0 Note Initiated On: 10/19/2024 11:16 AM Estimated Blood Loss:  Estimated blood loss: none.      [facility]

## 2024-10-19 NOTE — H&P (Signed)
 Rogelia Copping, MD [facility] [address] [place], [place] [address] [phone] Fax : [phone]  Primary Care Physician:  Gareth Mliss FALCON, FNP Primary Gastroenterologist:  Dr. Copping  Pre-Procedure History & Physical: HPI:  Seth Byrd is a 78 y.o. male is here for an colonoscopy.   Past Medical History:  Diagnosis Date   Arthritis    Cancer [facility])    CHF (congestive heart failure) (HCC)    Hypertension    Myocardial infarction [facility])    Prostate cancer (HCC) 10/19/2017    Past Surgical History:  Procedure Laterality Date   BACK SURGERY     Spinal Fusion with 2 screws   COLONOSCOPY WITH PROPOFOL  N/A 10/31/2021   Procedure: COLONOSCOPY WITH PROPOFOL ;  Surgeon: Janalyn Keene NOVAK, MD;  Location: [facility] ENDOSCOPY;  Service: Endoscopy;  Laterality: N/A;   CORONARY ARTERY BYPASS GRAFT N/A 02/10/2017   Procedure: CORONARY ARTERY BYPASS GRAFTING (CABG), ON PUMP, TIMES THREE, USING LEFT INTERNAL MAMMARY ARTERY AND ENDOSCOPICALLY HARVESTED BILATERAL GREATER SAPHENOUS VEINS;  Surgeon: Dallas NOVAK Jude, MD;  Location: [facility] OR;  Service: Open Heart Surgery;  Laterality: N/A;  LIMA to LAD SVG to Diag 1 SVG to OM1   JOINT REPLACEMENT     LEFT HEART CATH AND CORONARY ANGIOGRAPHY N/A 02/09/2017   Procedure: Left Heart Cath and Coronary Angiography;  Surgeon: Vinie DELENA Jude, MD;  Location: [facility] INVASIVE CV LAB;  Service: Cardiovascular;  Laterality: N/A;   TEE WITHOUT CARDIOVERSION N/A 02/10/2017   Procedure: TRANSESOPHAGEAL ECHOCARDIOGRAM (TEE);  Surgeon: Dallas NOVAK Jude, MD;  Location: [facility] OR;  Service: Open Heart Surgery;  Laterality: N/A;   TOTAL KNEE ARTHROPLASTY Left 04/23/2015   Procedure: TOTAL KNEE ARTHROPLASTY;  Surgeon: Lonni Sharps, MD;  Location: [facility] ORS;  Service: Orthopedics;  Laterality: Left;    Prior to Admission medications  Medication Sig Start Date End Date Taking? Authorizing Provider  aspirin  EC 81 MG tablet Take 81 mg by mouth daily.   Yes [provider]  atorvastatin  (LIPITOR) 40 MG tablet Take 1 tablet (40 mg total) by mouth daily at 6 PM. 02/09/17  Yes Laurence Bridegroom, MD  carvedilol  (COREG ) 12.5 MG tablet Take 12.5 mg by mouth 2 (two) times daily with a meal. 05/03/18  Yes [provider]  celecoxib  (CELEBREX ) 100 MG capsule Take 1 capsule (100 mg total) by mouth 2 (two) times daily. 05/08/24  Yes Pender, Julie F, FNP  furosemide  (LASIX ) 40 MG tablet Take 1 tablet (40 mg total) by mouth daily. 02/19/17  Yes Barrett, Erin R, PA-C  spironolactone  (ALDACTONE ) 25 MG tablet Take 1 tablet (25 mg total) by mouth daily. 02/19/17  Yes Barrett, Erin R, PA-C  tiZANidine  (ZANAFLEX ) 4 MG tablet Take 1 tablet (4 mg total) by mouth every 8 (eight) hours as needed for muscle spasms. 05/08/24  Yes Pender, Julie F, FNP  vitamin B-12 (CYANOCOBALAMIN ) 1000 MCG tablet Take 1,000 mcg by mouth daily.    Yes [provider]  acetaminophen  (TYLENOL ) 500 MG tablet Take 500 mg by mouth every 6 (six) hours as needed.    [provider]  bimekizumab-bkzx [facility]) 320 MG/2ML pen Inject into the skin.    [provider]  clindamycin (CLEOCIN T) 1 % external solution  01/25/19   [provider]  clobetasol  (TEMOVATE ) 0.05 % external solution APPLY TO SCALP DAILY 04/04/18   [provider]  Emollient (CERAVE) CREA Apply topically.    [provider]  ENTRESTO  49-51 MG TK 1 T  PO BID 04/21/19   [provider]  ketoconazole  (NIZORAL ) 2 % cream  12/13/17   [provider]  Multiple Vitamins-Minerals (ONE-A-DAY MENS 50+ ADVANTAGE PO) Take 1 tablet by mouth daily.    [provider]  Omega-3 Fatty Acids (FISH OIL) 1200 MG CAPS Take 1 capsule by mouth 2 (two) times daily.     [provider]  predniSONE  (STERAPRED UNI-PAK 21 TAB) 10 MG (21) TBPK tablet Start on 7/1 ( Tuesday) Take as directed on package.  (60 mg po on day 1, 50 mg po on day 2...) 05/08/24   Gareth Mliss FALCON, FNP     Allergies as of 09/20/2024   (No Known Allergies)    Family History  Problem Relation Age of Onset   Hypertension Mother    Transient ischemic attack Mother    CVA Mother    Colon cancer Father    Hypertension Father    Prostate cancer Father    Diabetes Brother    Kidney cancer Neg Hx    Bladder Cancer Neg Hx     Social History   Socioeconomic History   Marital status: Divorced    Spouse name: Not on file   Number of children: 2   Years of education: 12   Highest education level: Associate degree: academic program  Occupational History   Not on file  Tobacco Use   Smoking status: Former    Current packs/day: 0.00    Average packs/day: 1.5 packs/day for 35.0 years (52.5 ttl pk-yrs)    Types: Cigarettes    Start date: 04/09/1970    Quit date: 04/09/2005    Years since quitting: 19.5   Smokeless tobacco: Never  Vaping Use   Vaping status: Never Used  Substance and Sexual Activity   Alcohol use: No   Drug use: No   Sexual activity: Yes    Birth control/protection: None  Other Topics Concern   Not on file  Social History Narrative   Not on file   Social Drivers of Health   Tobacco Use: Medium Risk (10/19/2024)   Patient History    Smoking Tobacco Use: Former    Smokeless Tobacco Use: Never    Passive Exposure: Not on Actuary Strain: Low Risk (09/15/2024)   Overall Financial Resource Strain (CARDIA)    Difficulty of Paying Living Expenses: Not hard at all  Food Insecurity: No Food Insecurity (09/15/2024)   Epic    Worried About Radiation Protection Practitioner of Food in the Last Year: Never true    Ran Out of Food in the Last Year: Never true  Transportation Needs: No Transportation Needs (09/15/2024)   Epic    Lack of Transportation (Medical): No    Lack of Transportation (Non-Medical): No  Physical Activity: Sufficiently Active (09/15/2024)   Exercise Vital Sign    Days of Exercise per Week: 7 days    Minutes of Exercise per Session: 30 min  Stress: No  Stress Concern Present (09/15/2024)   [employer] of Occupational Health - Occupational Stress Questionnaire    Feeling of Stress: Not at all  Social Connections: Moderately Integrated (09/15/2024)   Social Connection and Isolation Panel    Frequency of Communication with Friends and Family: More than three times a week    Frequency of Social Gatherings with Friends and Family: More than three times a week    Attends Religious Services: More than 4 times per year    Active Member of [employer] or Organizations: Yes  Attends Banker Meetings: More than 4 times per year    Marital Status: Divorced  Intimate Partner Violence: Not At Risk (09/15/2024)   Epic    Fear of Current or Ex-Partner: No    Emotionally Abused: No    Physically Abused: No    Sexually Abused: No  Depression (PHQ2-9): Low Risk (09/15/2024)   Depression (PHQ2-9)    PHQ-2 Score: 0  Alcohol Screen: Low Risk (09/15/2024)   Alcohol Screen    Last Alcohol Screening Score (AUDIT): 0  Housing: Unknown (09/15/2024)   Epic    Unable to Pay for Housing in the Last Year: No    Number of Times Moved in the Last Year: Not on file    Homeless in the Last Year: No  Utilities: Not At Risk (09/15/2024)   Epic    Threatened with loss of utilities: No  Health Literacy: Adequate Health Literacy (09/15/2024)   B1300 Health Literacy    Frequency of need for help with medical instructions: Rarely    Review of Systems: See HPI, otherwise negative ROS  Physical Exam: BP 139/67   Pulse (!) 59   Temp (!) 97.2 F (36.2 C) (Temporal)   Resp 18   Ht 5' 10 (1.778 m)   Wt 68.9 kg   SpO2 100%   BMI 21.81 kg/m  General:   Alert,  pleasant and cooperative in NAD Head:  Normocephalic and atraumatic. Neck:  Supple; no masses or thyromegaly. Lungs:  Clear throughout to auscultation.    Heart:  Regular rate and rhythm. Abdomen:  Soft, nontender and nondistended. Normal bowel sounds, without guarding, and without rebound.    Neurologic:  Alert and  oriented x4;  grossly normal neurologically.  Impression/Plan: Seth Byrd is here for an colonoscopy to be performed for a history of adenomatous polyps on 2022   Risks, benefits, limitations, and alternatives regarding  colonoscopy have been reviewed with the patient.  Questions have been answered.  All parties agreeable.   Rogelia Copping, MD  10/19/2024, 10:35 AM

## 2024-10-19 NOTE — Anesthesia Preprocedure Evaluation (Addendum)
 Anesthesia Evaluation  Patient identified by MRN, date of birth, ID band Patient awake    Reviewed: Allergy & Precautions, NPO status , Patient's Chart, lab work & pertinent test results  History of Anesthesia Complications Negative for: history of anesthetic complications  Airway Mallampati: II   Neck ROM: Full    Dental  (+) Upper Dentures, Lower Dentures   Pulmonary COPD, former smoker (quit 2006)   Pulmonary exam normal breath sounds clear to auscultation       Cardiovascular hypertension, + CAD (s/p MI and CABG) and +CHF  Normal cardiovascular exam Rhythm:Regular Rate:Normal  ECG 07/24/24: Normal sinus rhythm  Nonspecific intraventricular conduction delay   Echo 08/11/23:  MODERATE LEFT VENTRICULAR SYSTOLIC DYSFUNCTION WITH MILD LVH  ESTIMATED EF: 35%, CALC EF(2D): 39%  NORMAL LA PRESSURES WITH DIASTOLIC DYSFUNCTION  MILD RIGHT VENTRICULAR SYSTOLIC DYSFUNCTION  VALVULAR REGURGITATION: MILD AR, MILD MR, SEVERE PR, MODERATE TR  NO VALVULAR STENOSIS      Neuro/Psych negative neurological ROS     GI/Hepatic negative GI ROS,,,  Endo/Other  negative endocrine ROS    Renal/GU negative Renal ROS   Prostate CA    Musculoskeletal  (+) Arthritis ,    Abdominal   Peds  Hematology negative hematology ROS (+)   Anesthesia Other Findings Cardiology note 07/24/24:  78 y.o. male with  CAD status post three-vessel CABG 2018 Heart failure with reduced EF Ischemic cardiomyopathy Moderate tricuspid/pulmonary regurgitation Hypertension Hyperlipidemia  Stable from cardiac standpoint without any anginal symptoms. Euvolemic on exam. Blood pressure well-controlled. Continue p.o. Lasix . Continue low-dose aspirin . Continue atorvastatin , LDL at goal. Continue goal-directed medical therapy with Entresto , Coreg , spironolactone . Echocardiogram before next visit to follow-up on LVEF and valvular regurgitation.  Return in  about 6 months (around 01/21/2025).    Reproductive/Obstetrics                              Anesthesia Physical Anesthesia Plan  ASA: 3  Anesthesia Plan: General   Post-op Pain Management:    Induction: Intravenous  PONV Risk Score and Plan: 2 and Propofol  infusion, TIVA and Treatment may vary due to age or medical condition  Airway Management Planned: Natural Airway  Additional Equipment:   Intra-op Plan:   Post-operative Plan:   Informed Consent: I have reviewed the patients History and Physical, chart, labs and discussed the procedure including the risks, benefits and alternatives for the proposed anesthesia with the patient or authorized representative who has indicated his/her understanding and acceptance.       Plan Discussed with: CRNA  Anesthesia Plan Comments: (LMA/GETA backup discussed.  Patient consented for risks of anesthesia including but not limited to:  - adverse reactions to medications - damage to eyes, teeth, lips or other oral mucosa - nerve damage due to positioning  - sore throat or hoarseness - damage to heart, brain, nerves, lungs, other parts of body or loss of life  Informed patient about role of CRNA in peri- and intra-operative care.  Patient voiced understanding.)         Anesthesia Quick Evaluation

## 2024-10-19 NOTE — Anesthesia Postprocedure Evaluation (Signed)
 Anesthesia Post Note  Patient: Seth Byrd  Procedure(s) Performed: COLONOSCOPY POLYPECTOMY, INTESTINE  Patient location during evaluation: PACU Anesthesia Type: General Level of consciousness: awake and alert, oriented and patient cooperative Pain management: pain level controlled Vital Signs Assessment: post-procedure vital signs reviewed and stable Respiratory status: spontaneous breathing, nonlabored ventilation and respiratory function stable Cardiovascular status: blood pressure returned to baseline and stable Postop Assessment: adequate PO intake Anesthetic complications: no   No notable events documented.   Last Vitals:  Vitals:   10/19/24 1151 10/19/24 1201  BP: 99/60 (!) 137/59  Pulse: 66   Resp: 18 17  Temp:    SpO2: 99%     Last Pain:  Vitals:   10/19/24 1201  TempSrc:   PainSc: 0-No pain                 Alfonso Ruths

## 2024-10-20 ENCOUNTER — Ambulatory Visit: Payer: Self-pay | Admitting: Gastroenterology

## 2024-10-20 LAB — SURGICAL PATHOLOGY

## 2025-02-02 ENCOUNTER — Other Ambulatory Visit

## 2025-07-31 ENCOUNTER — Other Ambulatory Visit

## 2025-08-07 ENCOUNTER — Ambulatory Visit: Admitting: Urology

## 2025-09-21 ENCOUNTER — Encounter: Admitting: Nurse Practitioner
